# Patient Record
Sex: Female | Born: 1935 | Race: White | Hispanic: No | Smoking: Never smoker
Health system: Southern US, Community
[De-identification: ages and names within clinical notes are randomized; demographics above are authoritative.]

## PROBLEM LIST (undated history)

## (undated) DIAGNOSIS — K5792 Diverticulitis of intestine, part unspecified, without perforation or abscess without bleeding: Secondary | ICD-10-CM

## (undated) DIAGNOSIS — M1990 Unspecified osteoarthritis, unspecified site: Secondary | ICD-10-CM

## (undated) DIAGNOSIS — R0600 Dyspnea, unspecified: Secondary | ICD-10-CM

## (undated) DIAGNOSIS — I639 Cerebral infarction, unspecified: Secondary | ICD-10-CM

## (undated) DIAGNOSIS — I4891 Unspecified atrial fibrillation: Secondary | ICD-10-CM

## (undated) DIAGNOSIS — I10 Essential (primary) hypertension: Secondary | ICD-10-CM

## (undated) DIAGNOSIS — K219 Gastro-esophageal reflux disease without esophagitis: Secondary | ICD-10-CM

## (undated) DIAGNOSIS — E785 Hyperlipidemia, unspecified: Secondary | ICD-10-CM

## (undated) DIAGNOSIS — G459 Transient cerebral ischemic attack, unspecified: Secondary | ICD-10-CM

## (undated) HISTORY — DX: Essential (primary) hypertension: I10

## (undated) HISTORY — PX: WRIST SURGERY: SHX841

## (undated) HISTORY — PX: KNEE SURGERY: SHX244

## (undated) HISTORY — DX: Diverticulitis of intestine, part unspecified, without perforation or abscess without bleeding: K57.92

## (undated) HISTORY — DX: Hyperlipidemia, unspecified: E78.5

## (undated) HISTORY — DX: Unspecified atrial fibrillation: I48.91

## (undated) HISTORY — DX: Transient cerebral ischemic attack, unspecified: G45.9

---

## 2020-04-16 HISTORY — PX: HIP FRACTURE SURGERY: SHX118

## 2020-07-08 ENCOUNTER — Ambulatory Visit (INDEPENDENT_AMBULATORY_CARE_PROVIDER_SITE_OTHER): Payer: Medicare Other | Admitting: Diagnostic Neuroimaging

## 2020-07-08 ENCOUNTER — Encounter: Payer: Self-pay | Admitting: Diagnostic Neuroimaging

## 2020-07-08 VITALS — BP 164/75 | HR 65 | Wt 175.0 lb

## 2020-07-08 DIAGNOSIS — G459 Transient cerebral ischemic attack, unspecified: Secondary | ICD-10-CM | POA: Diagnosis not present

## 2020-07-08 NOTE — Progress Notes (Signed)
[facility]  PATIENT: [name] DOB: [birth year]  REFERRING CLINICIAN: No ref. provider found (self; via son-in-law Dr. Beverely Pace) HISTORY FROM: patient  REASON FOR VISIT: new consult    HISTORICAL  CHIEF COMPLAINT:  Chief Complaint  Patient presents with  . New Patient (Initial Visit)    Rm 6 here as a new for to f/u on TIA. Pt is here with her  son in Youth worker.  . Transient Ischemic Attack    HISTORY OF PRESENT ILLNESS:   86 year old female with hypertension, hyperlipidemia, transient atrial fibrillation in May 2021, here for evaluation of transient left-sided numbness.  May 2021 patient was living at home alone in [place], turned around lost balance and fell down.  She ended up with a left hip fracture requiring surgery.  Postoperatively patient had transient atrial fibrillation, treated with amiodarone and then resolved. Hospital course was complicated by pneumonia and other factors.  She had a prolonged course and rehabilitation.  In July 2021 patient was finally able to return home.  Patient came to [place] to be closer to her daughter and son-in-law for the last few weeks.    07/07/2020 at 2 PM patient had transient left face, left arm and slight left leg numbness.  Symptoms lasted for less than 1 hour and then resolved.  Patient son-in-law who is an ER physician (previously worked at [employer] long) came to patient's aid and then called our office to expedite an evaluation.  Today patient feels well.  No recurrent symptoms.   REVIEW OF SYSTEMS: Full 14 system review of systems performed and negative with exception of: As per HPI.  ALLERGIES: No Known Allergies  HOME MEDICATIONS: Outpatient Medications Prior to Visit  Medication Sig Dispense Refill  . amLODipine (NORVASC) 10 MG tablet Take 10 mg by mouth daily.    . Ascorbic Acid (VITAMIN C) 1000 MG tablet Take 1,000 mg by mouth daily.    Marland Kitchen aspirin 81 MG chewable tablet Chew 81 mg by  mouth daily.    . benazepril (LOTENSIN) 20 MG tablet Take 20 mg by mouth daily.    . cetirizine (ZYRTEC) 10 MG tablet Take by mouth daily.    . ferrous sulfate (FERROUSUL) 325 (65 FE) MG tablet Take by mouth daily with breakfast.    . gabapentin (NEURONTIN) 300 MG capsule Take 300 mg by mouth 3 (three) times daily.    Marland Kitchen HYDROcodone-acetaminophen (NORCO/VICODIN) 5-325 MG tablet Take 1 tablet by mouth every 6 (six) hours as needed for moderate pain.    Marland Kitchen labetalol (NORMODYNE) 200 MG tablet Take 200 mg by mouth 2 (two) times daily.    . methocarbamol (ROBAXIN) 500 MG tablet Take 500 mg by mouth 4 (four) times daily.    . Multiple Vitamin (MULTIVITAMIN ADULT PO) Take by mouth.    . Multiple Vitamins-Minerals (PRESERVISION AREDS PO) Take by mouth.    . Polyethylene Glycol 3350 (MIRALAX PO) Take by mouth.    . Potassium Chloride (KCL-20 PO) Take by mouth.    . pravastatin (PRAVACHOL) 20 MG tablet Take by mouth daily.    Marland Kitchen spironolactone (ALDACTONE) 25 MG tablet Take by mouth daily.    . temazepam (RESTORIL) 30 MG capsule Take by mouth at bedtime as needed for sleep.    . vitamin B-12 (CYANOCOBALAMIN) 1000 MCG tablet Take 1,000 mcg by mouth daily.    . Vitamin D, Cholecalciferol, 50 MCG (2000 UT) CAPS Take by mouth.    . Vitamin E 400 units TABS  Take by mouth.     No facility-administered medications prior to visit.    PAST MEDICAL HISTORY: Past Medical History:  Diagnosis Date  . Atrial fibrillation, transient (HCC)    post op hip surgery; May 2021  . Hyperlipidemia   . Hypertension     PAST SURGICAL HISTORY: Past Surgical History:  Procedure Laterality Date  . HIP FRACTURE SURGERY Left 04/16/2020    FAMILY HISTORY: No family history on file.  SOCIAL HISTORY: Social History   Socioeconomic History  . Marital status: Widowed    Spouse name: Not on file  . Number of children: Not on file  . Years of education: Not on file  . Highest education level: Not on file  Occupational  History  . Not on file  Tobacco Use  . Smoking status: Not on file  Substance and Sexual Activity  . Alcohol use: Not on file  . Drug use: Not on file  . Sexual activity: Not on file  Other Topics Concern  . Not on file  Social History Narrative  . Not on file   Social Determinants of Health   Financial Resource Strain:   . Difficulty of Paying Living Expenses: Not on file  Food Insecurity:   . Worried About Programme researcher, broadcasting/film/video in the Last Year: Not on file  . Ran Out of Food in the Last Year: Not on file  Transportation Needs:   . Lack of Transportation (Medical): Not on file  . Lack of Transportation (Non-Medical): Not on file  Physical Activity:   . Days of Exercise per Week: Not on file  . Minutes of Exercise per Session: Not on file  Stress:   . Feeling of Stress : Not on file  Social Connections:   . Frequency of Communication with Friends and Family: Not on file  . Frequency of Social Gatherings with Friends and Family: Not on file  . Attends Religious Services: Not on file  . Active Member of Clubs or Organizations: Not on file  . Attends Banker Meetings: Not on file  . Marital Status: Not on file  Intimate Partner Violence:   . Fear of Current or Ex-Partner: Not on file  . Emotionally Abused: Not on file  . Physically Abused: Not on file  . Sexually Abused: Not on file     PHYSICAL EXAM  GENERAL EXAM/CONSTITUTIONAL: Vitals:  Vitals:   07/08/20 1004  BP: (!) 164/75  Pulse: 65  Weight: 175 lb (79.4 kg)     There is no height or weight on file to calculate BMI. Wt Readings from Last 3 Encounters:  07/08/20 175 lb (79.4 kg)     Patient is in no distress; well developed, nourished and groomed; neck is supple  CARDIOVASCULAR:  Examination of carotid arteries is normal; no carotid bruits  Regular rate and rhythm, no murmurs  Examination of peripheral vascular system by observation and palpation is normal  EYES:  Ophthalmoscopic  exam of optic discs and posterior segments is normal; no papilledema or hemorrhages  No exam data present  MUSCULOSKELETAL:  Gait, strength, tone, movements noted in Neurologic exam below  NEUROLOGIC: MENTAL STATUS:  No flowsheet data found.  awake, alert, oriented to person, place and time  recent and remote memory intact  normal attention and concentration  language fluent, comprehension intact, naming intact  fund of knowledge appropriate  CRANIAL NERVE:   2nd - no papilledema on fundoscopic exam  2nd, 3rd, 4th, 6th - pupils equal  and reactive to light, visual fields full to confrontation, extraocular muscles intact, no nystagmus  5th - facial sensation symmetric  7th - facial strength symmetric  8th - hearing intact  9th - palate elevates symmetrically, uvula midline  11th - shoulder shrug symmetric  12th - tongue protrusion midline  MOTOR:   normal bulk and tone, full strength in the BUE, BLE; EXCEPT DECR IN LEFT HIP FLEXION (LIMITED BY PAIN)  SENSORY:   normal and symmetric to light touch, temperature, vibration; EXCEPT DECR IN FEET / ANKLES  COORDINATION:   finger-nose-finger, fine finger movements normal  REFLEXES:   deep tendon reflexes --> BUE 3; POSITIVE HOFFMANS; KNEES 2; ANKLES 1  GAIT/STATION:   narrow based gait; STOOPED POSTURE; USING ROLLATOR WALKER     DIAGNOSTIC DATA (LABS, IMAGING, TESTING) - I reviewed patient records, labs, notes, testing and imaging myself where available.  No results found for: WBC, HGB, HCT, MCV, PLT No results found for: NA, K, CL, CO2, GLUCOSE, BUN, CREATININE, CALCIUM, PROT, ALBUMIN, AST, ALT, ALKPHOS, BILITOT, GFRNONAA, GFRAA No results found for: CHOL, HDL, LDLCALC, LDLDIRECT, TRIG, CHOLHDL No results found for: GYKZ9D No results found for: VITAMINB12 No results found for: TSH     ASSESSMENT AND PLAN  86 y.o. year old female here with transient left face, left arm and left leg numbness on  07/07/2020, lasting 1 hour.  May represent mild TIA.  We will proceed with further work-up.  Localization: right brain subcortical  Dx:  1. TIA (transient ischemic attack)     PLAN:  LEFT FACE / ARM / LEG numbness - MRI brain, MRA head - carotid u/s - TTE - labs  POST-OP transient atrial fibrillation (May 2021) - appears to be in sinus rhythm now; will refer to cardiology for workup and eval; patient's son-in-law (Dr. Carren Rang) requesting Dr. Johney Frame for consult; may need to consider anticoagulation pending workup  GAIT DIFFICULTY (hyperreflexia in upper ext; decr sensation in lower ext; ? cervical myelopathy, lumbar spinal stenosis, neuropathy) - consider MRI cervical spine, lumbar spine, neuropathy evaluation in future  Orders Placed This Encounter  Procedures  . MR BRAIN WO CONTRAST  . MR ANGIO HEAD WO CONTRAST  . CBC with Differential/Platelet  . Hemoglobin A1c  . Lipid Panel  . Comprehensive metabolic panel  . Ambulatory referral to Cardiology  . ECHOCARDIOGRAM COMPLETE BUBBLE STUDY  . VAS US CAROTID   Return for pending if symptoms worsen or fail to improve.  I reviewed images, labs, notes, records myself. I summarized findings and reviewed with patient, for this high risk condition (TIA) requiring high complexity decision making.    Suanne Marker, MD 07/08/2020, 10:50 AM Certified in Neurology, Neurophysiology and Neuroimaging  [facility] [address], [address] [place], [place] [address] [phone]

## 2020-07-09 LAB — CBC WITH DIFFERENTIAL/PLATELET
Basophils Absolute: 0.1 10*3/uL (ref 0.0–0.2)
Basos: 1 %
EOS (ABSOLUTE): 0.2 10*3/uL (ref 0.0–0.4)
Eos: 2 %
Hematocrit: 39 % (ref 34.0–46.6)
Hemoglobin: 12.2 g/dL (ref 11.1–15.9)
Immature Grans (Abs): 0 10*3/uL (ref 0.0–0.1)
Immature Granulocytes: 0 %
Lymphocytes Absolute: 1.9 10*3/uL (ref 0.7–3.1)
Lymphs: 20 %
MCH: 25.2 pg — ABNORMAL LOW (ref 26.6–33.0)
MCHC: 31.3 g/dL — ABNORMAL LOW (ref 31.5–35.7)
MCV: 80 fL (ref 79–97)
Monocytes Absolute: 0.7 10*3/uL (ref 0.1–0.9)
Monocytes: 7 %
Neutrophils Absolute: 6.6 10*3/uL (ref 1.4–7.0)
Neutrophils: 70 %
Platelets: 299 10*3/uL (ref 150–450)
RBC: 4.85 x10E6/uL (ref 3.77–5.28)
RDW: 14.9 % (ref 11.7–15.4)
WBC: 9.5 10*3/uL (ref 3.4–10.8)

## 2020-07-09 LAB — COMPREHENSIVE METABOLIC PANEL
ALT: 8 IU/L (ref 0–32)
AST: 14 IU/L (ref 0–40)
Albumin/Globulin Ratio: 2.1 (ref 1.2–2.2)
Albumin: 4.5 g/dL (ref 3.6–4.6)
Alkaline Phosphatase: 123 IU/L — ABNORMAL HIGH (ref 48–121)
BUN/Creatinine Ratio: 21 (ref 12–28)
BUN: 17 mg/dL (ref 8–27)
Bilirubin Total: 0.3 mg/dL (ref 0.0–1.2)
CO2: 25 mmol/L (ref 20–29)
Calcium: 10.4 mg/dL — ABNORMAL HIGH (ref 8.7–10.3)
Chloride: 98 mmol/L (ref 96–106)
Creatinine, Ser: 0.82 mg/dL (ref 0.57–1.00)
GFR calc Af Amer: 75 mL/min/{1.73_m2} (ref >59)
GFR calc non Af Amer: 65 mL/min/{1.73_m2} (ref >59)
Globulin, Total: 2.1 g/dL (ref 1.5–4.5)
Glucose: 98 mg/dL (ref 65–99)
Potassium: 4.9 mmol/L (ref 3.5–5.2)
Sodium: 137 mmol/L (ref 134–144)
Total Protein: 6.6 g/dL (ref 6.0–8.5)

## 2020-07-09 LAB — LIPID PANEL
Chol/HDL Ratio: 5.6 ratio — ABNORMAL HIGH (ref 0.0–4.4)
Cholesterol, Total: 211 mg/dL — ABNORMAL HIGH (ref 100–199)
HDL: 38 mg/dL — ABNORMAL LOW (ref >39)
LDL Chol Calc (NIH): 137 mg/dL — ABNORMAL HIGH (ref 0–99)
Triglycerides: 198 mg/dL — ABNORMAL HIGH (ref 0–149)
VLDL Cholesterol Cal: 36 mg/dL (ref 5–40)

## 2020-07-09 LAB — HEMOGLOBIN A1C
Est. average glucose Bld gHb Est-mCnc: 114 mg/dL
Hgb A1c MFr Bld: 5.6 % (ref 4.8–5.6)

## 2020-07-11 ENCOUNTER — Telehealth: Payer: Self-pay | Admitting: Diagnostic Neuroimaging

## 2020-07-11 NOTE — Telephone Encounter (Signed)
Medicare/bcbs supp no auth. And the Francine Graven is a RX card . I emailed Rinaldo Cloud the manager at [facility] to see if they can get him in as soon as possible.

## 2020-07-11 NOTE — Telephone Encounter (Signed)
Patient is scheduled at [facility] for 07/12/20.

## 2020-07-12 ENCOUNTER — Ambulatory Visit
Admission: RE | Admit: 2020-07-12 | Discharge: 2020-07-12 | Disposition: A | Discharge status: Home / Self Care | Payer: Medicare Other | Source: Ambulatory Visit | Attending: Diagnostic Neuroimaging | Admitting: Diagnostic Neuroimaging

## 2020-07-12 ENCOUNTER — Other Ambulatory Visit: Payer: Self-pay

## 2020-07-12 DIAGNOSIS — G459 Transient cerebral ischemic attack, unspecified: Secondary | ICD-10-CM

## 2020-07-13 ENCOUNTER — Telehealth: Payer: Self-pay | Admitting: *Deleted

## 2020-07-13 NOTE — Telephone Encounter (Addendum)
Called daughter, Dennie Bible and informed her the patient's labs are ok, except elevated lipids; Dr Marjory Lies advises she follow up with PCP to adjust statin medication. Pat asked that I call her husband Gloris Manchester who was in appointment with patient. I called Vince, LVM requesting call back.  Received call back from [facility] who asked for specific lipid panel #, gave him information. Advised the results will be in her my chart.  He  verbalized understanding, appreciation.

## 2020-07-15 ENCOUNTER — Other Ambulatory Visit: Payer: Self-pay

## 2020-07-15 ENCOUNTER — Ambulatory Visit (HOSPITAL_COMMUNITY)
Admission: RE | Admit: 2020-07-15 | Discharge: 2020-07-15 | Disposition: A | Discharge status: Home / Self Care | Payer: Medicare Other | Source: Ambulatory Visit | Attending: Diagnostic Neuroimaging | Admitting: Diagnostic Neuroimaging

## 2020-07-15 DIAGNOSIS — G459 Transient cerebral ischemic attack, unspecified: Secondary | ICD-10-CM | POA: Diagnosis not present

## 2020-07-18 ENCOUNTER — Telehealth: Payer: Self-pay | Admitting: *Deleted

## 2020-07-18 ENCOUNTER — Encounter: Payer: Self-pay | Admitting: *Deleted

## 2020-07-18 NOTE — Telephone Encounter (Signed)
Spoke with Vince, son-in-law on DPR and advised her MRI brain, MRA head results are unremarkable imaging results. She has up coming appointment with cardiology and ECHO scheduled. I advised Dr Marjory Lies will see echo results and then make any other recommendations for her care. He verbalized understanding, appreciation.

## 2020-07-21 ENCOUNTER — Ambulatory Visit (INDEPENDENT_AMBULATORY_CARE_PROVIDER_SITE_OTHER): Payer: Medicare Other | Admitting: Internal Medicine

## 2020-07-21 ENCOUNTER — Other Ambulatory Visit: Payer: Self-pay

## 2020-07-21 ENCOUNTER — Encounter: Payer: Self-pay | Admitting: Internal Medicine

## 2020-07-21 ENCOUNTER — Telehealth: Payer: Self-pay | Admitting: Radiology

## 2020-07-21 VITALS — BP 130/72 | HR 65 | Ht 68.0 in | Wt 177.4 lb

## 2020-07-21 DIAGNOSIS — I480 Paroxysmal atrial fibrillation: Secondary | ICD-10-CM | POA: Insufficient documentation

## 2020-07-21 DIAGNOSIS — I10 Essential (primary) hypertension: Secondary | ICD-10-CM

## 2020-07-21 DIAGNOSIS — G459 Transient cerebral ischemic attack, unspecified: Secondary | ICD-10-CM | POA: Diagnosis not present

## 2020-07-21 MED ORDER — APIXABAN 5 MG PO TABS: 5.0000 mg | ORAL_TABLET | Freq: Two times a day (BID) | ORAL | 11 refills | Status: DC

## 2020-07-21 NOTE — Patient Instructions (Addendum)
Medication Instructions:  Your physician has recommended you make the following change in your medication:  1.  STOP taking aspirin  2.  START taking ELIQUIS 5 mg- Take one tablet by mouth TWICE a day.  Labwork: None ordered.  Testing/Procedures: Your physician has recommended that you wear a holter monitor. Holter monitors are medical devices that record the heart's electrical activity. Doctors most often use these monitors to diagnose arrhythmias. Arrhythmias are problems with the speed or rhythm of the heartbeat.   You are going to wear a 30 day heart monitor.  Do NOT put this monitor on until AFTER your ECHO on August 02, 2020.   Follow-Up: Your physician wants you to follow-up in: 3 months with Dr. Johney Frame.       Any Other Special Instructions Will Be Listed Below (If Applicable).  If you need a refill on your cardiac medications before your next appointment, please call your pharmacy.   [employer] Cardiac Event Monitor Instructions Your physician has requested you wear your cardiac event monitor for 30 days.  Do NOT wear until AFTER your ECHO  [employer] may call or text to confirm a shipping address. The monitor will be sent to a land address via UPS. [employer] will not ship a monitor to a PO BOX. It typically takes 3-5 days to receive your monitor after it has been enrolled. [employer] will assist with USPS tracking if your package is delayed. The telephone number for [employer] is [phone]. Once you have received your monitor, please review the enclosed instructions. Instruction tutorials can also be viewed under help and settings on the enclosed cell phone. Your monitor has already been registered assigning a specific monitor serial # to you.  Applying the monitor Remove cell phone from case and turn it on. The cell phone works as IT consultant and needs to be within [employer] of you at all times. The cell phone will need to be charged on a daily basis. We  recommend you plug the cell phone into the enclosed charger at your bedside table every night.  Monitor batteries: You will receive two monitor batteries labelled #1 and #2. These are your recorders. Plug battery #2 onto the second connection on the enclosed charger. Keep one battery on the charger at all times. This will keep the monitor battery deactivated. It will also keep it fully charged for when you need to switch your monitor batteries. A small light will be blinking on the battery emblem when it is charging. The light on the battery emblem will remain on when the battery is fully charged.  Open package of a Monitor strip. Insert battery #1 into black hood on strip and gently squeeze monitor battery onto connection as indicated in instruction booklet. Set aside while preparing skin.  Choose location for your strip, vertical or horizontal, as indicated in the instruction booklet. Shave to remove all hair from location. There cannot be any lotions, oils, powders, or colognes on skin where monitor is to be applied. Wipe skin clean with enclosed Saline wipe. Dry skin completely.  Peel paper labeled #1 off the back of the Monitor strip exposing the adhesive. Place the monitor on the chest in the vertical or horizontal position shown in the instruction booklet. One arrow on the monitor strip must be pointing upward. Carefully remove paper labeled #2, attaching remainder of strip to your skin. Try not to create any folds or wrinkles in the strip as you apply it.  Firmly press and release the circle  in the center of the monitor battery. You will hear a small beep. This is turning the monitor battery on. The heart emblem on the monitor battery will light up every 5 seconds if the monitor battery in turned on and connected to the patient securely. Do not push and hold the circle down as this turns the monitor battery off. The cell phone will locate the monitor battery. A screen will appear on  the cell phone checking the connection of your monitor strip. This may read poor connection initially but change to good connection within the next minute. Once your monitor accepts the connection you will hear a series of 3 beeps followed by a climbing crescendo of beeps. A screen will appear on the cell phone showing the two monitor strip placement options. Touch the picture that demonstrates where you applied the monitor strip.  Your monitor strip and battery are waterproof. You are able to shower, bathe, or swim with the monitor on. They just ask you do not submerge deeper than 3 feet underwater. We recommend removing the monitor if you are swimming in a lake, river, or ocean.  Your monitor battery will need to be switched to a fully charged monitor battery approximately once a week. The cell phone will alert you of an action which needs to be made.  On the cell phone, tap for details to reveal connection status, monitor battery status, and cell phone battery status. The green dots indicates your monitor is in good status. A red dot indicates there is something that needs your attention.  To record a symptom, click the circle on the monitor battery. In 30-60 seconds a list of symptoms will appear on the cell phone. Select your symptom and tap save. Your monitor will record a sustained or significant arrhythmia regardless of you clicking the button. Some patients do not feel the heart rhythm irregularities. [employer] will notify us of any serious or critical events.  Refer to instruction booklet for instructions on switching batteries, changing strips, the Do not disturb or Pause features, or any additional questions.  Call [employer] at [phone], to confirm your monitor is transmitting and record your baseline. They will answer any questions you may have regarding the monitor instructions at that time.  Returning the monitor to [employer] Place all equipment back into blue  box. Peel off strip of paper to expose adhesive and close box securely. There is a prepaid UPS shipping label on this box. Drop in a UPS drop box, or at a UPS facility like [employer]. You may also contact [employer] to arrange UPS to pick up monitor package at your home.

## 2020-07-21 NOTE — Telephone Encounter (Signed)
Enrolled patient for a 30 day [employer] Event Monitor to be mailed to patients home. Per dr patient is to apply after 9/14

## 2020-07-21 NOTE — Progress Notes (Signed)
Electrophysiology Office Note   Date:  07/21/2020   ID:  [name], DOB [birth year], MRN [id number]  PCP:  Patient, No Pcp Per  Cardiologist:  none Primary Electrophysiologist: Hillis Range, MD    CC: afib   History of Present Illness: [name] is a 86 y.o. female who presents today for electrophysiology evaluation.  She presents on referral by Dr Marjory Lies for EP consultation regarding afib. The patient had a fall in May 2021 and was evaluated at a hospital in [facility].  On plans for surgery, she was noted to have afib.  Per her family, the patient had a cardiology evaluation at that tiime and was (after 2 days) felt to be safe for hip surgery. She underwent hip surgery without issues. She developed transient left face, arm and leg numbness 07/07/20 for which she was evaluated by Dr Marjory Lies.  She is felt to have had a TIA.   Given recent afib, she is referred for EP evaluation. Of note, she has been treated with ASA and has not been on oral anticoagulation previously.  Today, she denies symptoms of palpitations, chest pain, shortness of breath, orthopnea, PND, lower extremity edema, claudication, dizziness, presyncope, syncope, bleeding, or neurologic sequela. The patient is tolerating medications without difficulties and is otherwise without complaint today.    Past Medical History:  Diagnosis Date  . Atrial fibrillation, transient (HCC)    post op hip surgery; May 2021  . Diverticulitis   . Hyperlipidemia   . Hypertension   . TIA (transient ischemic attack)    Past Surgical History:  Procedure Laterality Date  . HIP FRACTURE SURGERY Left 04/16/2020     Current Outpatient Medications  Medication Sig Dispense Refill  . amLODipine (NORVASC) 10 MG tablet Take 10 mg by mouth daily.    . Ascorbic Acid (VITAMIN C) 1000 MG tablet Take 1,000 mg by mouth daily.    . benazepril (LOTENSIN) 20 MG tablet Take 20 mg by mouth daily.    . cetirizine (ZYRTEC) 10  MG tablet Take by mouth daily.    . ferrous sulfate (FERROUSUL) 325 (65 FE) MG tablet Take by mouth daily with breakfast.    . gabapentin (NEURONTIN) 300 MG capsule Take 300 mg by mouth 3 (three) times daily.    Marland Kitchen HYDROcodone-acetaminophen (NORCO/VICODIN) 5-325 MG tablet Take 1 tablet by mouth every 6 (six) hours as needed for moderate pain.    Marland Kitchen labetalol (NORMODYNE) 200 MG tablet Take 200 mg by mouth 2 (two) times daily.    . methocarbamol (ROBAXIN) 500 MG tablet Take 500 mg by mouth 4 (four) times daily.    . Multiple Vitamin (MULTIVITAMIN ADULT PO) Take by mouth.    . Multiple Vitamins-Minerals (PRESERVISION AREDS PO) Take by mouth.    . Polyethylene Glycol 3350 (MIRALAX PO) Take by mouth.    . Potassium Chloride (KCL-20 PO) Take by mouth.    . pravastatin (PRAVACHOL) 20 MG tablet Take by mouth daily.    Marland Kitchen spironolactone (ALDACTONE) 25 MG tablet Take by mouth daily.    . temazepam (RESTORIL) 30 MG capsule Take by mouth at bedtime as needed for sleep.    . vitamin B-12 (CYANOCOBALAMIN) 1000 MCG tablet Take 1,000 mcg by mouth daily.    . Vitamin D, Cholecalciferol, 50 MCG (2000 UT) CAPS Take by mouth.    . Vitamin E 400 units TABS Take by mouth.    Marland Kitchen apixaban (ELIQUIS) 5 MG TABS tablet Take 1 tablet (5 mg total) by mouth  2 (two) times daily. 60 tablet 11   No current facility-administered medications for this visit.    Allergies:   Patient has no known allergies.   Social History:  The patient  reports that she has never smoked. She has never used smokeless tobacco. She reports that she does not drink alcohol.   Family History:  The patient's  family history includes Diabetes in her mother.    ROS:  Please see the history of present illness.   All other systems are personally reviewed and negative.    PHYSICAL EXAM: VS:  BP 130/72   Pulse 65   Ht 5\' 8"  (1.727 m)   Wt 177 lb 6.4 oz (80.5 kg)   SpO2 97%   BMI 26.97 kg/m  , BMI Body mass index is 26.97 kg/m. GEN: Well  nourished, well developed, in no acute distress HEENT: normal Neck: no JVD, carotid bruits, or masses Cardiac: RRR; no murmurs, rubs, or gallops,no edema  Respiratory:  clear to auscultation bilaterally, normal work of breathing GI: soft, nontender, nondistended, + BS MS: no deformity or atrophy Skin: warm and dry  Neuro:  Strength and sensation are intact Psych: euthymic mood, full affect  EKG:  EKG is ordered today. The ekg ordered today is personally reviewed and shows sinus rhythm 65 bpm PR 170 msec, otherwise normal ekg   Recent Labs: 07/08/2020: ALT 8; BUN 17; Creatinine, Ser 0.82; Hemoglobin 12.2; Platelets 299; Potassium 4.9; Sodium 137  personally reviewed   Lipid Panel     Component Value Date/Time   CHOL 211 (H) 07/08/2020 1130   TRIG 198 (H) 07/08/2020 1130   HDL 38 (L) 07/08/2020 1130   CHOLHDL 5.6 (H) 07/08/2020 1130   LDLCALC 137 (H) 07/08/2020 1130   personally reviewed   Wt Readings from Last 3 Encounters:  07/21/20 177 lb 6.4 oz (80.5 kg)  07/08/20 175 lb (79.4 kg)      Other studies personally reviewed: Additional studies/ records that were reviewed today include: Dr 07/10/20 notes  Review of the above records today demonstrates: as above   ASSESSMENT AND PLAN:  1.  afib The patient had afib recently documented in the setting of hip fracture, but prior to hip surgery.  I will attempt to obtain records from this hospitalization. I will also place a 30 day monitor to evaluate for recurrent afib. As she appears to be asymptomatic, I would not adjuste medicine at this time for rate control or consider AADs. Given chads2vasc score of at least 6, I will stop ASA and start eliquis. Obtain an echo  2. TIA Stop ASA and start eliquis as above  3. HTN Stable No change required today echo  Risks, benefits and potential toxicities for medications prescribed and/or refilled reviewed with patient today.    Follow-up:  Return to see me in 2-3  months  Current medicines are reviewed at length with the patient today.   The patient does not have concerns regarding her medicines.  The following changes were made today:  none  Labs/ tests ordered today include:  Orders Placed This Encounter  Procedures  . Cardiac event monitor  . EKG 12-Lead     Signed, Richrd Humbles, MD  07/21/2020 8:46 PM     [facility] [address] [place] [phone] (office) [phone] (fax)

## 2020-07-28 ENCOUNTER — Other Ambulatory Visit: Payer: Self-pay | Admitting: Orthopedic Surgery

## 2020-07-28 DIAGNOSIS — S72042A Displaced fracture of base of neck of left femur, initial encounter for closed fracture: Secondary | ICD-10-CM

## 2020-08-01 ENCOUNTER — Ambulatory Visit
Admission: RE | Admit: 2020-08-01 | Discharge: 2020-08-01 | Disposition: A | Discharge status: Home / Self Care | Payer: Medicare Other | Source: Ambulatory Visit | Attending: Orthopedic Surgery | Admitting: Orthopedic Surgery

## 2020-08-01 ENCOUNTER — Other Ambulatory Visit: Payer: Self-pay

## 2020-08-01 DIAGNOSIS — S72042A Displaced fracture of base of neck of left femur, initial encounter for closed fracture: Secondary | ICD-10-CM

## 2020-08-02 ENCOUNTER — Telehealth: Payer: Self-pay | Admitting: *Deleted

## 2020-08-02 ENCOUNTER — Ambulatory Visit (HOSPITAL_COMMUNITY): Payer: Medicare Other | Attending: Cardiovascular Disease

## 2020-08-02 DIAGNOSIS — G459 Transient cerebral ischemic attack, unspecified: Secondary | ICD-10-CM | POA: Diagnosis not present

## 2020-08-02 LAB — ECHOCARDIOGRAM COMPLETE BUBBLE STUDY
Area-P 1/2: 2.8 cm2
Calc EF: 49.2 %
S' Lateral: 5.3 cm
Single Plane A2C EF: 50.8 %
Single Plane A4C EF: 49.2 %

## 2020-08-02 NOTE — Telephone Encounter (Signed)
Spoke with Vince, son-in-law, on DPR and informed him her echocardiogram result is unremarkable, no major findings. He verbalized understanding, appreciation.

## 2020-08-10 ENCOUNTER — Institutional Professional Consult (permissible substitution): Payer: Medicare Other | Admitting: Internal Medicine

## 2020-08-10 ENCOUNTER — Other Ambulatory Visit: Payer: Medicare Other

## 2020-08-10 ENCOUNTER — Encounter (INDEPENDENT_AMBULATORY_CARE_PROVIDER_SITE_OTHER): Payer: Medicare Other

## 2020-08-10 DIAGNOSIS — I480 Paroxysmal atrial fibrillation: Secondary | ICD-10-CM | POA: Diagnosis not present

## 2020-08-17 ENCOUNTER — Telehealth: Payer: Self-pay | Admitting: *Deleted

## 2020-08-17 NOTE — Telephone Encounter (Signed)
Thanks. Dr. Johney Frame - In addition to pharm input, see question below regarding event monitor. Jhonathan Desroches PA-C

## 2020-08-17 NOTE — Telephone Encounter (Signed)
   Primary Cardiologist: Hillis Range, MD  Chart reviewed as part of pre-operative protocol coverage. [name] was last seen on 07/21/20 by Dr. Johney Frame. Per notes, she had a history of fall in 03/2020 requiring hip surgery and tolerated well. She has history of TIA 06/2020. She recently saw Dr. Johney Frame for evaluation. He noted a prior history of atrial fibrillation in 03/2020, therefore recommended to switch ASA to Eliquis. 2D echo 08/02/20 showed EF 55-60%, grade 1 DD.  Event monitor is planned and is in progress. Will route to Dr. Johney Frame for input on whether monitor needs to be completed for to clearing for hip surgery. We will plan to call patient to assess for any recent symptoms but need to know if we need to wait until monitor is finished. Please route response to [facility] PREOP (the pre-op pool). Thank you.  Will also get the ball rolling on pharm input for Eliquis.  Laurann Montana, PA-C 08/17/2020, 4:40 PM

## 2020-08-17 NOTE — Telephone Encounter (Signed)
   [facility] Pre-operative Risk Assessment    HEARTCARE STAFF: - Please ensure there is not already an duplicate clearance open for this procedure. - Under Visit Info/Reason for Call, type in Other and utilize the format Clearance MM/DD/YY or Clearance TBD. Do not use dashes or single digits. - If request is for dental extraction, please clarify the # of teeth to be extracted.  Request for surgical clearance:  1. What type of surgery is being performed? CONVERSION TO LEFT TOTAL HIP ARTHROPLASTY-POSTERIOR APPROACH   2. When is this surgery scheduled? TBD   3. What type of clearance is required (medical clearance vs. Pharmacy clearance to hold med vs. Both)? BOTH  4. Are there any medications that need to be held prior to surgery and how long? ELIQUIS   5. Practice name and name of physician performing surgery? [facility]; DR. [place]   6. What is the office phone number? [phone]   7.   What is the office fax number? [place]  8.   Anesthesia type (None, local, MAC, general) ? SPINAL   Julaine Hua 08/17/2020, 2:03 PM  _________________________________________________________________   (provider comments below)

## 2020-08-17 NOTE — Telephone Encounter (Signed)
Patient with diagnosis of A Fib on Eliquis for anticoagulation.    Procedure: CONVERSION TO LEFT TOTAL HIP ARTHROPLASTY-POSTERIOR APPROACH  Date of procedure: TBD  CHADS2-VASc score of  6 (HTN, AGE x 2, stroke/tia x 2, female)  CrCl 63.74 Platelet count 299K  Due to patient's recent TIA, will route to MD for input

## 2020-08-23 NOTE — Telephone Encounter (Signed)
   Pt's daughter calling to follow up clearance, she said pt is in so much pain and need the surgery badly.

## 2020-08-23 NOTE — Telephone Encounter (Signed)
Dr. Johney Frame, Please seen [name]'s note below. Looks like patient is pending results from event monitor. Do you recommend holding off on surgery until event monitor is completed?

## 2020-08-24 NOTE — Telephone Encounter (Signed)
   Primary Cardiologist: Hillis Range, MD  Chart reviewed as part of pre-operative protocol coverage.   Per Dr. Johney Frame: I spoke with Dr Charlann Boxer.  Ok to proceed with surgery at this time.  Hold eliquis 24 hours prior to the procedure and resume as soon as able post operatively.  I will route this recommendation to the requesting party via Epic fax function and remove from pre-op pool. Please call with questions.  Roe Rutherford Edilia Ghuman, PA 08/24/2020, 11:06 AM

## 2020-08-24 NOTE — Telephone Encounter (Signed)
I spoke with Dr Charlann Boxer.  Ok to proceed with surgery at this time. Hold eliquis 24 hours prior to the procedure and resume as soon as able post operatively.

## 2020-08-25 ENCOUNTER — Telehealth: Payer: Self-pay

## 2020-08-25 NOTE — Telephone Encounter (Signed)
Daughter of the patient called to check on the status of the surgical clearance. Patient is in constant pain and is anxious to get procedure scheduled

## 2020-08-25 NOTE — Telephone Encounter (Signed)
   [facility] Pre-operative Risk Assessment    HEARTCARE STAFF: - Please ensure there is not already an duplicate clearance open for this procedure. - Under Visit Info/Reason for Call, type in Other and utilize the format Clearance MM/DD/YY or Clearance TBD. Do not use dashes or single digits. - If request is for dental extraction, please clarify the # of teeth to be extracted.  Request for surgical clearance:  1. What type of surgery is being performed? Conversion to left total hip arthroplasty-posterior approach    2. When is this surgery scheduled? TBD   3. What type of clearance is required (medical clearance vs. Pharmacy clearance to hold med vs. Both)? Medical   4. Are there any medications that need to be held prior to surgery and how long?   5. Practice name and name of physician performing surgery? [facility], Dr. Paralee Cancel    6. What is the office phone number? [phone]   7.   What is the office fax number? [phone]  8.   Anesthesia type (None, local, MAC, general) ? Spinal    [name] 08/25/2020, 2:10 PM  _________________________________________________________________   (provider comments below)

## 2020-08-25 NOTE — Telephone Encounter (Signed)
Cardiac clearance sent yesterday 08/24/2020 at 11:06 AM.  Please contact patient's daughter and let her know that her clearance was sent to Dr. Boneta Lucks office.  Thank you.

## 2020-08-25 NOTE — Telephone Encounter (Signed)
Pt daughter Dennie Bible notified. She will call requesting providers office and check status

## 2020-09-02 NOTE — Progress Notes (Signed)
DUE TO COVID-19 ONLY ONE VISITOR IS ALLOWED TO COME WITH YOU AND STAY IN THE WAITING ROOM ONLY DURING PRE OP AND PROCEDURE DAY OF SURGERY. THE 1 VISITOR  MAY VISIT WITH YOU AFTER SURGERY IN YOUR PRIVATE ROOM DURING VISITING HOURS ONLY!  YOU NEED TO HAVE A COVID 19 TEST ON_10/22/2021 ______ @_______ , THIS TEST MUST BE DONE BEFORE SURGERY,  COVID TESTING SITE [address] [place] , IT IS ON THE RIGHT GOING OUT [address] APPROXIMATELY  2 MINUTES PAST [employer] ON THE RIGHT. ONCE YOUR COVID TEST IS COMPLETED,  PLEASE BEGIN THE QUARANTINE INSTRUCTIONS AS OUTLINED IN YOUR HANDOUT.                [name]  09/02/2020   Your procedure is scheduled on: 09/13/2020    Report to [facility] Main  Entrance   Report to admitting at   0600 AM     Call this number if you have problems the morning of surgery [phone]    REMEMBER: NO  SOLID FOOD CANDY OR GUM AFTER MIDNIGHT. CLEAR LIQUIDS UNTIL  0540am       . NOTHING BY MOUTH EXCEPT CLEAR LIQUIDS UNTIL    . PLEASE FINISH ENSURE DRINK PER SURGEON ORDER  WHICH NEEDS TO BE COMPLETED AT  0540am  .      CLEAR LIQUID DIET   Foods Allowed                                                                    Coffee and tea, regular and decaf                            Fruit ices (not with fruit pulp)                                      Iced Popsicles                                    Carbonated beverages, regular and diet                                    Cranberry, grape and apple juices Sports drinks like Gatorade Lightly seasoned clear broth or consume(fat free) Sugar, honey syrup ___________________________________________________________________      BRUSH YOUR TEETH MORNING OF SURGERY AND RINSE YOUR MOUTH OUT, NO CHEWING GUM CANDY OR MINTS.     Take these medicines the morning of surgery with A SIP OF WATER:  Amlodipine, Zyrtec, Gabapentin, Labetalol, robaxin DO NOT TAKE ANY DIABETIC  MEDICATIONS DAY OF YOUR SURGERY                               You may not have any metal on your body including hair pins and              piercings  Do not wear jewelry, make-up,  lotions, powders or perfumes, deodorant             Do not wear nail polish on your fingernails.  Do not shave  48 hours prior to surgery.              Men may shave face and neck.   Do not bring valuables to the hospital. [place].  Contacts, dentures or bridgework may not be worn into surgery.  Leave suitcase in the car. After surgery it may be brought to your room.     Patients discharged the day of surgery will not be allowed to drive home. IF YOU ARE HAVING SURGERY AND GOING HOME THE SAME DAY, YOU MUST HAVE AN ADULT TO DRIVE YOU HOME AND BE WITH YOU FOR 24 HOURS. YOU MAY GO HOME BY TAXI OR UBER OR ORTHERWISE, BUT AN ADULT MUST ACCOMPANY YOU HOME AND STAY WITH YOU FOR 24 HOURS.  Name and phone number of your driver:  Special Instructions: N/A              Please read over the following fact sheets you were given: _____________________________________________________________________  [facility] - Preparing for Surgery Before surgery, you can play an important role.  Because skin is not sterile, your skin needs to be as free of germs as possible.  You can reduce the number of germs on your skin by washing with CHG (chlorahexidine gluconate) soap before surgery.  CHG is an antiseptic cleaner which kills germs and bonds with the skin to continue killing germs even after washing. Please DO NOT use if you have an allergy to CHG or antibacterial soaps.  If your skin becomes reddened/irritated stop using the CHG and inform your nurse when you arrive at Short Stay. Do not shave (including legs and underarms) for at least 48 hours prior to the first CHG shower.  You may shave your face/neck. Please follow these instructions carefully:  1.  Shower with CHG Soap the night  before surgery and the  morning of Surgery.  2.  If you choose to wash your hair, wash your hair first as usual with your  normal  shampoo.  3.  After you shampoo, rinse your hair and body thoroughly to remove the  shampoo.                           4.  Use CHG as you would any other liquid soap.  You can apply chg directly  to the skin and wash                       Gently with a scrungie or clean washcloth.  5.  Apply the CHG Soap to your body ONLY FROM THE NECK DOWN.   Do not use on face/ open                           Wound or open sores. Avoid contact with eyes, ears mouth and genitals (private parts).                       Wash face,  Genitals (private parts) with your normal soap.             6.  Wash thoroughly, paying special attention to  the area where your surgery  will be performed.  7.  Thoroughly rinse your body with warm water from the neck down.  8.  DO NOT shower/wash with your normal soap after using and rinsing off  the CHG Soap.                9.  Pat yourself dry with a clean towel.            10.  Wear clean pajamas.            11.  Place clean sheets on your bed the night of your first shower and do not  sleep with pets. Day of Surgery : Do not apply any lotions/deodorants the morning of surgery.  Please wear clean clothes to the hospital/surgery center.  FAILURE TO FOLLOW THESE INSTRUCTIONS MAY RESULT IN THE CANCELLATION OF YOUR SURGERY PATIENT SIGNATURE_________________________________  NURSE SIGNATURE__________________________________  ________________________________________________________________________

## 2020-09-05 ENCOUNTER — Encounter (HOSPITAL_COMMUNITY)
Admission: RE | Admit: 2020-09-05 | Discharge: 2020-09-05 | Disposition: A | Discharge status: Home / Self Care | Payer: Medicare Other | Source: Ambulatory Visit | Attending: Orthopedic Surgery | Admitting: Orthopedic Surgery

## 2020-09-05 ENCOUNTER — Encounter (HOSPITAL_COMMUNITY): Payer: Self-pay

## 2020-09-05 ENCOUNTER — Other Ambulatory Visit: Payer: Self-pay

## 2020-09-05 DIAGNOSIS — T8489XA Other specified complication of internal orthopedic prosthetic devices, implants and grafts, initial encounter: Secondary | ICD-10-CM | POA: Insufficient documentation

## 2020-09-05 DIAGNOSIS — E785 Hyperlipidemia, unspecified: Secondary | ICD-10-CM | POA: Diagnosis not present

## 2020-09-05 DIAGNOSIS — Z7901 Long term (current) use of anticoagulants: Secondary | ICD-10-CM | POA: Diagnosis not present

## 2020-09-05 DIAGNOSIS — I4891 Unspecified atrial fibrillation: Secondary | ICD-10-CM | POA: Diagnosis not present

## 2020-09-05 DIAGNOSIS — I10 Essential (primary) hypertension: Secondary | ICD-10-CM | POA: Diagnosis not present

## 2020-09-05 DIAGNOSIS — Z01818 Encounter for other preprocedural examination: Secondary | ICD-10-CM | POA: Diagnosis present

## 2020-09-05 DIAGNOSIS — Z79899 Other long term (current) drug therapy: Secondary | ICD-10-CM | POA: Diagnosis not present

## 2020-09-05 DIAGNOSIS — Z8673 Personal history of transient ischemic attack (TIA), and cerebral infarction without residual deficits: Secondary | ICD-10-CM | POA: Insufficient documentation

## 2020-09-05 DIAGNOSIS — Y838 Other surgical procedures as the cause of abnormal reaction of the patient, or of later complication, without mention of misadventure at the time of the procedure: Secondary | ICD-10-CM | POA: Diagnosis not present

## 2020-09-05 DIAGNOSIS — M1990 Unspecified osteoarthritis, unspecified site: Secondary | ICD-10-CM | POA: Insufficient documentation

## 2020-09-05 DIAGNOSIS — K219 Gastro-esophageal reflux disease without esophagitis: Secondary | ICD-10-CM | POA: Diagnosis not present

## 2020-09-05 HISTORY — DX: Gastro-esophageal reflux disease without esophagitis: K21.9

## 2020-09-05 HISTORY — DX: Unspecified osteoarthritis, unspecified site: M19.90

## 2020-09-05 HISTORY — DX: Dyspnea, unspecified: R06.00

## 2020-09-05 HISTORY — DX: Cerebral infarction, unspecified: I63.9

## 2020-09-05 LAB — CBC
HCT: 38.9 % (ref 36.0–46.0)
Hemoglobin: 12.4 g/dL (ref 12.0–15.0)
MCH: 26.8 pg (ref 26.0–34.0)
MCHC: 31.9 g/dL (ref 30.0–36.0)
MCV: 84 fL (ref 80.0–100.0)
Platelets: 262 10*3/uL (ref 150–400)
RBC: 4.63 MIL/uL (ref 3.87–5.11)
RDW: 15.2 % (ref 11.5–15.5)
WBC: 8.7 10*3/uL (ref 4.0–10.5)
nRBC: 0 % (ref 0.0–0.2)

## 2020-09-05 LAB — BASIC METABOLIC PANEL
Anion gap: 9 (ref 5–15)
BUN: 16 mg/dL (ref 8–23)
CO2: 27 mmol/L (ref 22–32)
Calcium: 9.8 mg/dL (ref 8.9–10.3)
Chloride: 100 mmol/L (ref 98–111)
Creatinine, Ser: 0.78 mg/dL (ref 0.44–1.00)
GFR, Estimated: >60 mL/min (ref >60)
Glucose, Bld: 98 mg/dL (ref 70–99)
Potassium: 4.1 mmol/L (ref 3.5–5.1)
Sodium: 136 mmol/L (ref 135–145)

## 2020-09-05 LAB — SURGICAL PCR SCREEN
MRSA, PCR: NEGATIVE
Staphylococcus aureus: NEGATIVE

## 2020-09-05 NOTE — Progress Notes (Signed)
Anesthesia Review:  PCP:  Cardiologist : DR Hillis Range  Clearance in 08/24/2020 Telephone oencunter  07/21/20- LOV with DR Allred  Chest x-ray : EKG :07/21/20  Echo :08/02/20 Stress test: Cardiac Cath :  Activity level: due to hip can not doa  Fight of stairs without difficulty.  Sleep Study/ CPAP : Fasting Blood Sugar :      / Checks Blood Sugar -- times a day:   Blood Thinner/ Instructions /Last Dose: ASA / Instructions/ Last Dose :  Eliquis- per daugher - they have received preop instructions

## 2020-09-06 NOTE — Progress Notes (Signed)
Anesthesia Chart Review   Case: [id number] Date/Time: 09/13/20 0825   Procedure: CONVERSION TO LEFT TOTAL HIP-POSTERIOR APPROACH (Left Hip) - 90 mins   Anesthesia type: Spinal   Pre-op diagnosis: Failed left hip surgery, malunion   Location: [facility] ROOM 10 / [facility]   Surgeons: Durene Romans, MD      DISCUSSION:87 y.o. never smoker with h/o HTN, a-fib (on Eliquis), GERD, TIA, failed left hip surgery with malunion scheduled for above procedure 09/13/2020 with Dr. Durene Romans.   Per cardiology note, "Per Dr. Johney Frame: I spoke with Dr Charlann Boxer. Ok to proceed with surgery at this time.  Hold eliquis 24 hours prior to the procedure and resume as soon as able post operatively."  Anticipate pt can proceed with planned procedure barring acute status change.   VS: BP (!) 144/49   Pulse 62   Temp 36.9 C (Oral)   Ht 5\' 7"  (1.702 m)   SpO2 97%   BMI 27.78 kg/m   PROVIDERS: Patient, No Pcp Per  , MD is Cardiologist  LABS: Labs reviewed: Acceptable for surgery. (all labs ordered are listed, but only abnormal results are displayed)  Labs Reviewed  SURGICAL PCR SCREEN  BASIC METABOLIC PANEL  CBC  TYPE AND SCREEN     IMAGES:   EKG: 07/21/2020 Rate 65 bpm  Sinus  CV: Echo 08/02/2020 IMPRESSIONS    1. Left ventricular ejection fraction, by estimation, is 55 to 60%. The  left ventricle has normal function. The left ventricle has no regional  wall motion abnormalities. Left ventricular diastolic parameters are  consistent with Grade I diastolic  dysfunction (impaired relaxation).  2. Right ventricular systolic function is normal. The right ventricular  size is normal. There is normal pulmonary artery systolic pressure. The  estimated right ventricular systolic pressure is 22.9 mmHg.  3. The mitral valve is degenerative. Trivial mitral valve regurgitation.  No evidence of mitral stenosis. Moderate mitral annular calcification.  4. The aortic valve is tricuspid. There is  mild calcification of the  aortic valve. Aortic valve regurgitation is not visualized. Mild aortic  valve sclerosis is present, with no evidence of aortic valve stenosis.  5. The inferior vena cava is normal in size with greater than 50%  respiratory variability, suggesting right atrial pressure of 3 mmHg.  6. Agitated saline contrast bubble study was negative, with no evidence  of any interatrial shunt. Past Medical History:  Diagnosis Date  . Arthritis   . Atrial fibrillation, transient (HCC)    post op hip surgery; May 2021  . Diverticulitis   . Dyspnea    with exertion   . GERD (gastroesophageal reflux disease)   . Hyperlipidemia   . Hypertension   . Stroke (HCC)    hx opf mini strokes   . TIA (transient ischemic attack)     Past Surgical History:  Procedure Laterality Date  . HIP FRACTURE SURGERY Left 04/16/2020  . KNEE SURGERY    . WRIST SURGERY      MEDICATIONS: . acetaminophen (TYLENOL) 500 MG tablet  . amLODipine (NORVASC) 10 MG tablet  . apixaban (ELIQUIS) 5 MG TABS tablet  . Ascorbic Acid (VITAMIN C) 1000 MG tablet  . benazepril (LOTENSIN) 20 MG tablet  . calcium carbonate (TUMS - DOSED IN MG ELEMENTAL CALCIUM) 500 MG chewable tablet  . cetirizine (ZYRTEC) 10 MG tablet  . Cholecalciferol (VITAMIN D3) 125 MCG (5000 UT) TABS  . diclofenac Sodium (VOLTAREN) 1 % GEL  . ferrous sulfate (FERROUSUL) 325 (65  FE) MG tablet  . gabapentin (NEURONTIN) 300 MG capsule  . labetalol (NORMODYNE) 200 MG tablet  . Multiple Vitamin (MULTIVITAMIN WITH MINERALS) TABS tablet  . Multiple Vitamins-Minerals (PRESERVISION AREDS 2 PO)  . oxyCODONE-acetaminophen (PERCOCET/ROXICET) 5-325 MG tablet  . Polyethylene Glycol 3350 (MIRALAX PO)  . Potassium Chloride (KCL-20 PO)  . pravastatin (PRAVACHOL) 20 MG tablet  . spironolactone (ALDACTONE) 25 MG tablet  . sucralfate (CARAFATE) 1 g tablet  . temazepam (RESTORIL) 30 MG capsule  . vitamin B-12 (CYANOCOBALAMIN) 1000 MCG tablet  .  Vitamin E 400 units TABS   No current facility-administered medications for this encounter.     Jodell Cipro, PA-C [facility] Pre-Surgical Testing [phone]

## 2020-09-06 NOTE — Anesthesia Preprocedure Evaluation (Addendum)
Anesthesia Evaluation  Patient identified by MRN, date of birth, ID band Patient awake    Reviewed: Allergy & Precautions, NPO status , Patient's Chart, lab work & pertinent test results  Airway Mallampati: II  TM Distance: >3 FB Neck ROM: Full    Dental no notable dental hx.    Pulmonary neg pulmonary ROS,    Pulmonary exam normal breath sounds clear to auscultation       Cardiovascular hypertension, Normal cardiovascular exam+ dysrhythmias Atrial Fibrillation  Rhythm:Regular Rate:Normal     Neuro/Psych negative neurological ROS  negative psych ROS   GI/Hepatic negative GI ROS, Neg liver ROS,   Endo/Other  negative endocrine ROS  Renal/GU negative Renal ROS  negative genitourinary   Musculoskeletal  (+) Arthritis , Osteoarthritis,    Abdominal   Peds negative pediatric ROS (+)  Hematology  (+) Blood dyscrasia, , Patient on eliquis   Anesthesia Other Findings   Reproductive/Obstetrics negative OB ROS                           Anesthesia Physical Anesthesia Plan  ASA: III  Anesthesia Plan: General   Post-op Pain Management:    Induction: Intravenous  PONV Risk Score and Plan: 3 and Ondansetron, Dexamethasone and Treatment may vary due to age or medical condition  Airway Management Planned: Oral ETT  Additional Equipment:   Intra-op Plan:   Post-operative Plan: Extubation in OR  Informed Consent: I have reviewed the patients History and Physical, chart, labs and discussed the procedure including the risks, benefits and alternatives for the proposed anesthesia with the patient or authorized representative who has indicated his/her understanding and acceptance.     Dental advisory given  Plan Discussed with: CRNA and Surgeon  Anesthesia Plan Comments: (See PAT note 09/05/2020, Jodell Cipro, PA-C)       Anesthesia Quick Evaluation

## 2020-09-09 ENCOUNTER — Other Ambulatory Visit (HOSPITAL_COMMUNITY): Payer: Medicare Other

## 2020-09-12 ENCOUNTER — Other Ambulatory Visit (HOSPITAL_COMMUNITY)
Admission: RE | Admit: 2020-09-12 | Discharge: 2020-09-12 | Disposition: A | Discharge status: Home / Self Care | Payer: Medicare Other | Source: Ambulatory Visit | Attending: Orthopedic Surgery | Admitting: Orthopedic Surgery

## 2020-09-12 DIAGNOSIS — Z20822 Contact with and (suspected) exposure to covid-19: Secondary | ICD-10-CM | POA: Insufficient documentation

## 2020-09-12 DIAGNOSIS — Z01812 Encounter for preprocedural laboratory examination: Secondary | ICD-10-CM | POA: Insufficient documentation

## 2020-09-12 LAB — SARS CORONAVIRUS 2 (TAT 6-24 HRS): SARS Coronavirus 2: NEGATIVE

## 2020-09-12 NOTE — H&P (Signed)
TOTAL HIP REVISION ADMISSION H&P  Patient is admitted for conversion of previous left hip surgery to THA  Subjective:  Chief Complaint: Failed left hip ORIF with OA   HPI: [name], 86 y.o. female, has a history of pain and functional disability in the left hip due to trauma and arthritis and patient has failed non-surgical conservative treatments for greater than 12 weeks to include NSAID's and/or analgesics, use of assistive devices and activity modification. The indications for the revision total hip arthroplasty are failed previous ORIF with OA.  Onset of symptoms was abrupt starting 5 months ago with gradually worsening course since that time.  Prior procedures on the left hip include ORIF.  Patient currently rates pain in the left hip at 10 out of 10 with activity.  There is night pain, worsening of pain with activity and weight bearing, trendelenberg gait, pain that interfers with activities of daily living and pain with passive range of motion. Patient has evidence of previous ORIF with OA by imaging studies.  This condition presents safety issues increasing the risk of falls.   There is no current active infection.  Risks, benefits and expectations were discussed with the patient.  Risks including but not limited to the risk of anesthesia, blood clots, nerve damage, blood vessel damage, failure of the prosthesis, infection and up to and including death.  Patient understand the risks, benefits and expectations and wishes to proceed with surgery.   PCP: Patient, No Pcp Per  D/C Plans:       Home   Post-op Meds:       No Rx given   Tranexamic Acid:      To be given - IV   Decadron:      Is to be given  FYI:      Eliquis  Oxycodone (on pre-op)  DME:   Pt already has equipment  PT:   [facility]  Pharmacy: [facility] [place]    Patient Active Problem List   Diagnosis Date Noted  . Paroxysmal atrial fibrillation (HCC) 07/21/2020   Past Medical History:  Diagnosis Date  . Arthritis    . Atrial fibrillation, transient (HCC)    post op hip surgery; May 2021  . Diverticulitis   . Dyspnea    with exertion   . GERD (gastroesophageal reflux disease)   . Hyperlipidemia   . Hypertension   . Stroke (HCC)    hx opf mini strokes   . TIA (transient ischemic attack)     Past Surgical History:  Procedure Laterality Date  . HIP FRACTURE SURGERY Left 04/16/2020  . KNEE SURGERY    . WRIST SURGERY      No current facility-administered medications for this encounter.   Current Outpatient Medications  Medication Sig Dispense Refill Last Dose  . acetaminophen (TYLENOL) 500 MG tablet Take 500-1,000 mg by mouth every 6 (six) hours as needed (for pain.).     Marland Kitchen amLODipine (NORVASC) 10 MG tablet Take 10 mg by mouth every evening.      Marland Kitchen apixaban (ELIQUIS) 5 MG TABS tablet Take 1 tablet (5 mg total) by mouth 2 (two) times daily. 60 tablet 11   . Ascorbic Acid (VITAMIN C) 1000 MG tablet Take 1,000 mg by mouth daily.     . benazepril (LOTENSIN) 20 MG tablet Take 20 mg by mouth 2 (two) times daily.      . calcium carbonate (TUMS - DOSED IN MG ELEMENTAL CALCIUM) 500 MG chewable tablet Chew 1-2 tablets by  mouth 3 (three) times daily as needed for indigestion or heartburn.     . cetirizine (ZYRTEC) 10 MG tablet Take 10 mg by mouth daily.      . Cholecalciferol (VITAMIN D3) 125 MCG (5000 UT) TABS Take 5,000 Units by mouth daily.     . diclofenac Sodium (VOLTAREN) 1 % GEL Apply 1 application topically 4 (four) times daily as needed (pain.).     Marland Kitchen ferrous sulfate (FERROUSUL) 325 (65 FE) MG tablet Take by mouth daily with breakfast.     . gabapentin (NEURONTIN) 300 MG capsule Take 300 mg by mouth 3 (three) times daily.     Marland Kitchen labetalol (NORMODYNE) 200 MG tablet Take 200 mg by mouth 2 (two) times daily.     . Multiple Vitamin (MULTIVITAMIN WITH MINERALS) TABS tablet Take 1 tablet by mouth daily.     . Multiple Vitamins-Minerals (PRESERVISION AREDS 2 PO) Take 1 tablet by mouth daily.     Marland Kitchen  oxyCODONE-acetaminophen (PERCOCET/ROXICET) 5-325 MG tablet Take 0.5-1 tablets by mouth 2 (two) times daily as needed for severe pain (pain.).     Marland Kitchen Polyethylene Glycol 3350 (MIRALAX PO) Take 17 g by mouth at bedtime.      . Potassium Chloride (KCL-20 PO) Take 20 mEq by mouth daily with supper.      . pravastatin (PRAVACHOL) 20 MG tablet Take 20 mg by mouth at bedtime.      Marland Kitchen spironolactone (ALDACTONE) 25 MG tablet Take 25 mg by mouth daily.      . sucralfate (CARAFATE) 1 g tablet Take 1 g by mouth 4 (four) times daily -  with meals and at bedtime.     . temazepam (RESTORIL) 30 MG capsule Take 30 mg by mouth at bedtime.      . vitamin B-12 (CYANOCOBALAMIN) 1000 MCG tablet Take 1,000 mcg by mouth daily.     . Vitamin E 400 units TABS Take 400 Units by mouth daily.       Allergies  Allergen Reactions  . Keflex [Cephalexin] Other (See Comments)    "makes pt feel really bad"    Social History   Tobacco Use  . Smoking status: Never Smoker  . Smokeless tobacco: Never Used  Substance Use Topics  . Alcohol use: Never    Family History  Problem Relation Age of Onset  . Diabetes Mother      Review of Systems  Constitutional: Negative.   HENT: Negative.   Eyes: Negative.   Respiratory: Negative.   Cardiovascular: Negative.   Gastrointestinal: Negative.   Genitourinary: Negative.   Musculoskeletal: Positive for joint pain.  Skin: Negative.   Neurological: Negative.   Endo/Heme/Allergies: Negative.   Psychiatric/Behavioral: Negative.       Objective:  Physical Exam Constitutional:      Appearance: She is well-developed.  HENT:     Head: Normocephalic.  Eyes:     Pupils: Pupils are equal, round, and reactive to light.  Neck:     Thyroid: No thyromegaly.     Vascular: No JVD.     Trachea: No tracheal deviation.  Cardiovascular:     Rate and Rhythm: Normal rate and regular rhythm.  Pulmonary:     Effort: Pulmonary effort is normal. No respiratory distress.     Breath  sounds: Normal breath sounds. No wheezing.  Abdominal:     Palpations: Abdomen is soft.     Tenderness: There is no abdominal tenderness. There is no guarding.  Musculoskeletal:     Cervical  back: Neck supple.     Left hip: Tenderness and bony tenderness present. Decreased range of motion. Decreased strength.  Lymphadenopathy:     Cervical: No cervical adenopathy.  Skin:    General: Skin is warm and dry.  Neurological:     Mental Status: She is alert and oriented to person, place, and time.     Sensory: Sensory deficit (numbness in toes) present.        Labs:  Estimated body mass index is 27.78 kg/m as calculated from the following:   Height as of 09/05/20: 5\' 7"  (1.702 m).   Weight as of 07/21/20: 80.5 kg.  Imaging Review:  Plain radiographs demonstrate severe degenerative joint disease of the left hip(s). There is evidence of loosening of the previous ORIF.The bone quality appears to be good for age and reported activity level.     Assessment/Plan:  End stage arthritis, left hip(s) with failed previous ORIF.  The patient history, physical examination, clinical judgement of the provider and imaging studies are consistent with end stage degenerative joint disease of the left hip(s), previous ORIF. Revision total hip arthroplasty is deemed medically necessary. The treatment options including medical management, injection therapy, arthroscopy and arthroplasty were discussed at length. The risks and benefits of total hip arthroplasty were presented and reviewed. The risks due to aseptic loosening, infection, stiffness, dislocation/subluxation,  thromboembolic complications and other imponderables were discussed.  The patient acknowledged the explanation, agreed to proceed with the plan and consent was signed. Patient is being admitted for treatment for surgery, pain control, PT, OT, prophylactic antibiotics, VTE prophylaxis, progressive ambulation and ADL's and discharge planning. The  patient is planning to be discharged home.     09/20/20 Jamear Carbonneau   PA-C  09/12/2020, 3:22 PM

## 2020-09-13 ENCOUNTER — Inpatient Hospital Stay (HOSPITAL_COMMUNITY)
Admission: RE | Admit: 2020-09-13 | Discharge: 2020-09-17 | DRG: 522 | Disposition: A | Discharge status: Skilled Nursing Facility | Payer: Medicare Other | Attending: Orthopedic Surgery | Admitting: Orthopedic Surgery

## 2020-09-13 ENCOUNTER — Other Ambulatory Visit: Payer: Self-pay

## 2020-09-13 ENCOUNTER — Observation Stay (HOSPITAL_COMMUNITY): Payer: Medicare Other

## 2020-09-13 ENCOUNTER — Encounter (HOSPITAL_COMMUNITY): Payer: Self-pay | Admitting: Orthopedic Surgery

## 2020-09-13 ENCOUNTER — Ambulatory Visit (HOSPITAL_COMMUNITY): Payer: Medicare Other | Admitting: Certified Registered"

## 2020-09-13 ENCOUNTER — Encounter (HOSPITAL_COMMUNITY)
Admission: RE | Disposition: A | Discharge status: Skilled Nursing Facility | Payer: Self-pay | Source: Home / Self Care | Attending: Orthopedic Surgery

## 2020-09-13 ENCOUNTER — Ambulatory Visit (HOSPITAL_COMMUNITY): Payer: Medicare Other | Admitting: Physician Assistant

## 2020-09-13 DIAGNOSIS — Z01812 Encounter for preprocedural laboratory examination: Secondary | ICD-10-CM

## 2020-09-13 DIAGNOSIS — Z79899 Other long term (current) drug therapy: Secondary | ICD-10-CM

## 2020-09-13 DIAGNOSIS — Z8673 Personal history of transient ischemic attack (TIA), and cerebral infarction without residual deficits: Secondary | ICD-10-CM

## 2020-09-13 DIAGNOSIS — Z20822 Contact with and (suspected) exposure to covid-19: Secondary | ICD-10-CM | POA: Diagnosis present

## 2020-09-13 DIAGNOSIS — I10 Essential (primary) hypertension: Secondary | ICD-10-CM | POA: Diagnosis present

## 2020-09-13 DIAGNOSIS — Z96649 Presence of unspecified artificial hip joint: Secondary | ICD-10-CM

## 2020-09-13 DIAGNOSIS — I480 Paroxysmal atrial fibrillation: Secondary | ICD-10-CM | POA: Diagnosis present

## 2020-09-13 DIAGNOSIS — Z7901 Long term (current) use of anticoagulants: Secondary | ICD-10-CM

## 2020-09-13 DIAGNOSIS — Z6825 Body mass index (BMI) 25.0-25.9, adult: Secondary | ICD-10-CM

## 2020-09-13 DIAGNOSIS — E785 Hyperlipidemia, unspecified: Secondary | ICD-10-CM | POA: Diagnosis present

## 2020-09-13 DIAGNOSIS — Z833 Family history of diabetes mellitus: Secondary | ICD-10-CM

## 2020-09-13 DIAGNOSIS — K219 Gastro-esophageal reflux disease without esophagitis: Secondary | ICD-10-CM | POA: Diagnosis present

## 2020-09-13 DIAGNOSIS — Z96642 Presence of left artificial hip joint: Secondary | ICD-10-CM

## 2020-09-13 DIAGNOSIS — S72002K Fracture of unspecified part of neck of left femur, subsequent encounter for closed fracture with nonunion: Principal | ICD-10-CM

## 2020-09-13 DIAGNOSIS — M1612 Unilateral primary osteoarthritis, left hip: Secondary | ICD-10-CM | POA: Diagnosis present

## 2020-09-13 DIAGNOSIS — E663 Overweight: Secondary | ICD-10-CM | POA: Diagnosis present

## 2020-09-13 HISTORY — PX: CONVERSION TO TOTAL HIP: SHX5784

## 2020-09-13 LAB — TYPE AND SCREEN
ABO/RH(D): O POS
Antibody Screen: NEGATIVE

## 2020-09-13 LAB — ABO/RH: ABO/RH(D): O POS

## 2020-09-13 SURGERY — CONVERSION, PREVIOUS HIP SURGERY, TO TOTAL HIP ARTHROPLASTY
Anesthesia: General | Site: Hip | Laterality: Left

## 2020-09-13 MED ORDER — FENTANYL CITRATE (PF) 250 MCG/5ML IJ SOLN
INTRAMUSCULAR | Status: DC | PRN
  Administered 2020-09-13 (×2): 25 ug via INTRAVENOUS
  Administered 2020-09-13: 50 ug via INTRAVENOUS
  Administered 2020-09-13 (×4): 25 ug via INTRAVENOUS

## 2020-09-13 MED ORDER — STERILE WATER FOR IRRIGATION IR SOLN
Status: DC | PRN
  Administered 2020-09-13: 2000 mL

## 2020-09-13 MED ORDER — OXYCODONE HCL 5 MG PO TABS
10.0000 mg | ORAL_TABLET | ORAL | Status: DC | PRN
  Administered 2020-09-14 – 2020-09-16 (×7): 10 mg via ORAL
  Filled 2020-09-13 (×7): qty 2

## 2020-09-13 MED ORDER — DEXAMETHASONE SODIUM PHOSPHATE 10 MG/ML IJ SOLN
10.0000 mg | Freq: Once | INTRAMUSCULAR | Status: AC
  Administered 2020-09-14: 10 mg via INTRAVENOUS
  Filled 2020-09-13: qty 1

## 2020-09-13 MED ORDER — TRANEXAMIC ACID-NACL 1000-0.7 MG/100ML-% IV SOLN
1000.0000 mg | Freq: Once | INTRAVENOUS | Status: AC
  Administered 2020-09-13: 1000 mg via INTRAVENOUS
  Filled 2020-09-13: qty 100

## 2020-09-13 MED ORDER — MAGNESIUM CITRATE PO SOLN: 1.0000 | Freq: Once | ORAL | Status: DC | PRN

## 2020-09-13 MED ORDER — ORAL CARE MOUTH RINSE: 15.0000 mL | Freq: Once | OROMUCOSAL | Status: AC

## 2020-09-13 MED ORDER — FERROUS SULFATE 325 (65 FE) MG PO TABS
325.0000 mg | ORAL_TABLET | Freq: Three times a day (TID) | ORAL | Status: DC
  Administered 2020-09-14 – 2020-09-17 (×10): 325 mg via ORAL
  Filled 2020-09-13 (×10): qty 1

## 2020-09-13 MED ORDER — APIXABAN 2.5 MG PO TABS
2.5000 mg | ORAL_TABLET | Freq: Two times a day (BID) | ORAL | Status: DC
  Administered 2020-09-14 – 2020-09-17 (×7): 2.5 mg via ORAL
  Filled 2020-09-13 (×7): qty 1

## 2020-09-13 MED ORDER — ONDANSETRON HCL 4 MG PO TABS
4.0000 mg | ORAL_TABLET | Freq: Four times a day (QID) | ORAL | Status: DC | PRN
  Administered 2020-09-15 – 2020-09-17 (×2): 4 mg via ORAL
  Filled 2020-09-13 (×4): qty 1

## 2020-09-13 MED ORDER — DEXAMETHASONE SODIUM PHOSPHATE 10 MG/ML IJ SOLN
10.0000 mg | Freq: Once | INTRAMUSCULAR | Status: AC
  Administered 2020-09-13: 10 mg via INTRAVENOUS

## 2020-09-13 MED ORDER — CEFAZOLIN SODIUM-DEXTROSE 2-4 GM/100ML-% IV SOLN
2.0000 g | INTRAVENOUS | Status: AC
  Administered 2020-09-13: 2 g via INTRAVENOUS
  Filled 2020-09-13: qty 100

## 2020-09-13 MED ORDER — CHLORHEXIDINE GLUCONATE 0.12 % MT SOLN
15.0000 mL | Freq: Once | OROMUCOSAL | Status: AC
  Administered 2020-09-13: 15 mL via OROMUCOSAL

## 2020-09-13 MED ORDER — DOCUSATE SODIUM 100 MG PO CAPS
100.0000 mg | ORAL_CAPSULE | Freq: Two times a day (BID) | ORAL | Status: DC
  Administered 2020-09-13 – 2020-09-17 (×8): 100 mg via ORAL
  Filled 2020-09-13 (×8): qty 1

## 2020-09-13 MED ORDER — OXYCODONE HCL 5 MG PO TABS
5.0000 mg | ORAL_TABLET | ORAL | Status: DC | PRN
  Administered 2020-09-13 – 2020-09-17 (×9): 5 mg via ORAL
  Filled 2020-09-13 (×9): qty 1

## 2020-09-13 MED ORDER — BISACODYL 10 MG RE SUPP
10.0000 mg | Freq: Every day | RECTAL | Status: DC | PRN
  Administered 2020-09-16: 10 mg via RECTAL
  Filled 2020-09-13: qty 1

## 2020-09-13 MED ORDER — DEXAMETHASONE SODIUM PHOSPHATE 10 MG/ML IJ SOLN
INTRAMUSCULAR | Status: AC
  Filled 2020-09-13: qty 1

## 2020-09-13 MED ORDER — ORAL CARE MOUTH RINSE: 15.0000 mL | Freq: Once | OROMUCOSAL | Status: DC

## 2020-09-13 MED ORDER — SODIUM CHLORIDE 0.9 % IV SOLN: INTRAVENOUS | Status: DC

## 2020-09-13 MED ORDER — 0.9 % SODIUM CHLORIDE (POUR BTL) OPTIME
TOPICAL | Status: DC | PRN
  Administered 2020-09-13: 1000 mL

## 2020-09-13 MED ORDER — AMISULPRIDE (ANTIEMETIC) 5 MG/2ML IV SOLN
INTRAVENOUS | Status: AC
  Administered 2020-09-13: 10 mg via INTRAVENOUS
  Filled 2020-09-13: qty 4

## 2020-09-13 MED ORDER — ONDANSETRON HCL 4 MG/2ML IJ SOLN
4.0000 mg | Freq: Once | INTRAMUSCULAR | Status: AC | PRN
  Administered 2020-09-13: 4 mg via INTRAVENOUS

## 2020-09-13 MED ORDER — TRANEXAMIC ACID-NACL 1000-0.7 MG/100ML-% IV SOLN
1000.0000 mg | INTRAVENOUS | Status: AC
  Administered 2020-09-13: 1000 mg via INTRAVENOUS
  Filled 2020-09-13: qty 100

## 2020-09-13 MED ORDER — AMISULPRIDE (ANTIEMETIC) 5 MG/2ML IV SOLN: 10.0000 mg | Freq: Once | INTRAVENOUS | Status: AC

## 2020-09-13 MED ORDER — OXYCODONE HCL 5 MG/5ML PO SOLN: 5.0000 mg | Freq: Once | ORAL | Status: DC | PRN

## 2020-09-13 MED ORDER — ROCURONIUM BROMIDE 10 MG/ML (PF) SYRINGE
PREFILLED_SYRINGE | INTRAVENOUS | Status: AC
  Filled 2020-09-13: qty 10

## 2020-09-13 MED ORDER — LABETALOL HCL 100 MG PO TABS
200.0000 mg | ORAL_TABLET | Freq: Two times a day (BID) | ORAL | Status: DC
  Administered 2020-09-13 – 2020-09-17 (×8): 200 mg via ORAL
  Filled 2020-09-13 (×8): qty 2

## 2020-09-13 MED ORDER — ONDANSETRON HCL 4 MG/2ML IJ SOLN
INTRAMUSCULAR | Status: AC
  Filled 2020-09-13: qty 2

## 2020-09-13 MED ORDER — METHOCARBAMOL 500 MG PO TABS
500.0000 mg | ORAL_TABLET | Freq: Four times a day (QID) | ORAL | Status: DC | PRN
  Administered 2020-09-13 – 2020-09-16 (×8): 500 mg via ORAL
  Filled 2020-09-13 (×8): qty 1

## 2020-09-13 MED ORDER — HYDROMORPHONE HCL 1 MG/ML IJ SOLN
INTRAMUSCULAR | Status: AC
  Filled 2020-09-13: qty 1

## 2020-09-13 MED ORDER — CHLORHEXIDINE GLUCONATE 0.12 % MT SOLN: 15.0000 mL | Freq: Once | OROMUCOSAL | Status: DC

## 2020-09-13 MED ORDER — MENTHOL 3 MG MT LOZG: 1.0000 | LOZENGE | OROMUCOSAL | Status: DC | PRN

## 2020-09-13 MED ORDER — SUCRALFATE 1 G PO TABS
1.0000 g | ORAL_TABLET | Freq: Three times a day (TID) | ORAL | Status: DC
  Administered 2020-09-13 – 2020-09-17 (×13): 1 g via ORAL
  Filled 2020-09-13 (×13): qty 1

## 2020-09-13 MED ORDER — DIPHENHYDRAMINE HCL 12.5 MG/5ML PO ELIX: 12.5000 mg | ORAL_SOLUTION | ORAL | Status: DC | PRN

## 2020-09-13 MED ORDER — SUGAMMADEX SODIUM 200 MG/2ML IV SOLN
INTRAVENOUS | Status: DC | PRN
  Administered 2020-09-13: 200 mg via INTRAVENOUS

## 2020-09-13 MED ORDER — METHOCARBAMOL 500 MG IVPB - SIMPLE MED
500.0000 mg | Freq: Four times a day (QID) | INTRAVENOUS | Status: DC | PRN
  Administered 2020-09-13: 500 mg via INTRAVENOUS
  Filled 2020-09-13: qty 50

## 2020-09-13 MED ORDER — PROPOFOL 10 MG/ML IV BOLUS
INTRAVENOUS | Status: DC | PRN
  Administered 2020-09-13: 100 mg via INTRAVENOUS

## 2020-09-13 MED ORDER — METOCLOPRAMIDE HCL 5 MG PO TABS: 5.0000 mg | ORAL_TABLET | Freq: Three times a day (TID) | ORAL | Status: DC | PRN

## 2020-09-13 MED ORDER — LIDOCAINE 2% (20 MG/ML) 5 ML SYRINGE
INTRAMUSCULAR | Status: DC | PRN
  Administered 2020-09-13: 100 mg via INTRAVENOUS

## 2020-09-13 MED ORDER — HYDROMORPHONE HCL 1 MG/ML IJ SOLN
0.2500 mg | INTRAMUSCULAR | Status: DC | PRN
  Administered 2020-09-13 (×3): 0.5 mg via INTRAVENOUS

## 2020-09-13 MED ORDER — ROCURONIUM BROMIDE 10 MG/ML (PF) SYRINGE
PREFILLED_SYRINGE | INTRAVENOUS | Status: DC | PRN
  Administered 2020-09-13: 10 mg via INTRAVENOUS
  Administered 2020-09-13: 60 mg via INTRAVENOUS

## 2020-09-13 MED ORDER — PROPOFOL 10 MG/ML IV BOLUS
INTRAVENOUS | Status: AC
  Filled 2020-09-13: qty 20

## 2020-09-13 MED ORDER — ONDANSETRON HCL 4 MG/2ML IJ SOLN
INTRAMUSCULAR | Status: DC | PRN
  Administered 2020-09-13: 4 mg via INTRAVENOUS

## 2020-09-13 MED ORDER — LORATADINE 10 MG PO TABS
10.0000 mg | ORAL_TABLET | Freq: Every day | ORAL | Status: DC
  Administered 2020-09-14 – 2020-09-17 (×4): 10 mg via ORAL
  Filled 2020-09-13 (×4): qty 1

## 2020-09-13 MED ORDER — ONDANSETRON HCL 4 MG/2ML IJ SOLN: 4.0000 mg | Freq: Four times a day (QID) | INTRAMUSCULAR | Status: DC | PRN

## 2020-09-13 MED ORDER — ACETAMINOPHEN 500 MG PO TABS
1000.0000 mg | ORAL_TABLET | Freq: Three times a day (TID) | ORAL | Status: DC
  Administered 2020-09-13 – 2020-09-17 (×10): 1000 mg via ORAL
  Filled 2020-09-13 (×10): qty 2

## 2020-09-13 MED ORDER — HYDROMORPHONE HCL 1 MG/ML IJ SOLN: 0.2500 mg | INTRAMUSCULAR | Status: DC | PRN

## 2020-09-13 MED ORDER — SPIRONOLACTONE 25 MG PO TABS
25.0000 mg | ORAL_TABLET | Freq: Every day | ORAL | Status: DC
  Administered 2020-09-14 – 2020-09-17 (×4): 25 mg via ORAL
  Filled 2020-09-13 (×5): qty 1

## 2020-09-13 MED ORDER — PROMETHAZINE HCL 25 MG PO TABS: 12.5000 mg | ORAL_TABLET | Freq: Four times a day (QID) | ORAL | Status: DC | PRN

## 2020-09-13 MED ORDER — GABAPENTIN 300 MG PO CAPS
300.0000 mg | ORAL_CAPSULE | Freq: Three times a day (TID) | ORAL | Status: DC
  Administered 2020-09-13 – 2020-09-17 (×11): 300 mg via ORAL
  Filled 2020-09-13 (×11): qty 1

## 2020-09-13 MED ORDER — FENTANYL CITRATE (PF) 100 MCG/2ML IJ SOLN
INTRAMUSCULAR | Status: AC
  Filled 2020-09-13: qty 2

## 2020-09-13 MED ORDER — CEFAZOLIN SODIUM-DEXTROSE 2-4 GM/100ML-% IV SOLN
2.0000 g | Freq: Four times a day (QID) | INTRAVENOUS | Status: AC
  Administered 2020-09-13 (×2): 2 g via INTRAVENOUS
  Filled 2020-09-13 (×2): qty 100

## 2020-09-13 MED ORDER — AMLODIPINE BESYLATE 10 MG PO TABS
10.0000 mg | ORAL_TABLET | Freq: Every evening | ORAL | Status: DC
  Administered 2020-09-14 – 2020-09-16 (×3): 10 mg via ORAL
  Filled 2020-09-13 (×3): qty 1

## 2020-09-13 MED ORDER — POLYETHYLENE GLYCOL 3350 17 G PO PACK
17.0000 g | PACK | Freq: Two times a day (BID) | ORAL | Status: DC
  Administered 2020-09-14 – 2020-09-16 (×4): 17 g via ORAL
  Filled 2020-09-13 (×7): qty 1

## 2020-09-13 MED ORDER — LACTATED RINGERS IV SOLN: INTRAVENOUS | Status: DC

## 2020-09-13 MED ORDER — OXYCODONE HCL 5 MG PO TABS: 5.0000 mg | ORAL_TABLET | Freq: Once | ORAL | Status: DC | PRN

## 2020-09-13 MED ORDER — METHOCARBAMOL 500 MG IVPB - SIMPLE MED
INTRAVENOUS | Status: AC
  Filled 2020-09-13: qty 50

## 2020-09-13 MED ORDER — PRAVASTATIN SODIUM 20 MG PO TABS
20.0000 mg | ORAL_TABLET | Freq: Every day | ORAL | Status: DC
  Administered 2020-09-13 – 2020-09-16 (×4): 20 mg via ORAL
  Filled 2020-09-13 (×4): qty 1

## 2020-09-13 MED ORDER — ALUM & MAG HYDROXIDE-SIMETH 200-200-20 MG/5ML PO SUSP
15.0000 mL | ORAL | Status: DC | PRN
  Administered 2020-09-14 – 2020-09-15 (×2): 15 mL via ORAL
  Filled 2020-09-13 (×2): qty 30

## 2020-09-13 MED ORDER — TEMAZEPAM 15 MG PO CAPS
30.0000 mg | ORAL_CAPSULE | Freq: Every day | ORAL | Status: DC
  Administered 2020-09-13 – 2020-09-16 (×4): 30 mg via ORAL
  Filled 2020-09-13 (×4): qty 2

## 2020-09-13 MED ORDER — METOCLOPRAMIDE HCL 5 MG/ML IJ SOLN
5.0000 mg | Freq: Three times a day (TID) | INTRAMUSCULAR | Status: DC | PRN
  Administered 2020-09-13: 10 mg via INTRAVENOUS
  Filled 2020-09-13: qty 2

## 2020-09-13 MED ORDER — PHENOL 1.4 % MT LIQD: 1.0000 | OROMUCOSAL | Status: DC | PRN

## 2020-09-13 MED ORDER — PROMETHAZINE HCL 25 MG/ML IJ SOLN
12.5000 mg | Freq: Four times a day (QID) | INTRAMUSCULAR | Status: DC | PRN
  Administered 2020-09-13: 12.5 mg via INTRAVENOUS
  Filled 2020-09-13: qty 1

## 2020-09-13 MED ORDER — HYDROMORPHONE HCL 1 MG/ML IJ SOLN
0.5000 mg | INTRAMUSCULAR | Status: DC | PRN
  Administered 2020-09-13 (×2): 1 mg via INTRAVENOUS
  Filled 2020-09-13 (×2): qty 1

## 2020-09-13 MED ORDER — LIDOCAINE 2% (20 MG/ML) 5 ML SYRINGE
INTRAMUSCULAR | Status: AC
  Filled 2020-09-13: qty 5

## 2020-09-13 SURGICAL SUPPLY — 51 items
BAG ZIPLOCK 12X15 (MISCELLANEOUS) ×3 IMPLANT
BLADE SAW SGTL 18X1.27X75 (BLADE) ×2 IMPLANT
BLADE SAW SGTL 18X1.27X75MM (BLADE) ×1
COVER SURGICAL LIGHT HANDLE (MISCELLANEOUS) ×3 IMPLANT
COVER WAND RF STERILE (DRAPES) IMPLANT
CUP ACETBLR 54 OD PINNACLE (Hips) ×3 IMPLANT
DERMABOND ADVANCED (GAUZE/BANDAGES/DRESSINGS) ×4
DERMABOND ADVANCED .7 DNX12 (GAUZE/BANDAGES/DRESSINGS) ×2 IMPLANT
DRAPE INCISE IOBAN 85X60 (DRAPES) ×3 IMPLANT
DRAPE ORTHO SPLIT 77X108 STRL (DRAPES) ×4
DRAPE POUCH INSTRU U-SHP 10X18 (DRAPES) ×3 IMPLANT
DRAPE SURG ORHT 6 SPLT 77X108 (DRAPES) ×2 IMPLANT
DRAPE U-SHAPE 47X51 STRL (DRAPES) ×6 IMPLANT
DRESSING AQUACEL AG SP 3.5X10 (GAUZE/BANDAGES/DRESSINGS) IMPLANT
DRSG AQUACEL AG ADV 3.5X14 (GAUZE/BANDAGES/DRESSINGS) ×3 IMPLANT
DRSG AQUACEL AG SP 3.5X10 (GAUZE/BANDAGES/DRESSINGS)
DURAPREP 26ML APPLICATOR (WOUND CARE) ×3 IMPLANT
ELECT REM PT RETURN 15FT ADLT (MISCELLANEOUS) IMPLANT
ELIMINATOR HOLE APEX DEPUY (Hips) ×3 IMPLANT
GLOVE BIOGEL PI IND STRL 7.5 (GLOVE) ×2 IMPLANT
GLOVE BIOGEL PI IND STRL 8.5 (GLOVE) ×1 IMPLANT
GLOVE BIOGEL PI INDICATOR 7.5 (GLOVE) ×4
GLOVE BIOGEL PI INDICATOR 8.5 (GLOVE) ×2
GLOVE ECLIPSE 8.0 STRL XLNG CF (GLOVE) ×3 IMPLANT
GLOVE ORTHO TXT STRL SZ7.5 (GLOVE) ×12 IMPLANT
GOWN STRL REUS W/TWL 2XL LVL3 (GOWN DISPOSABLE) ×3 IMPLANT
GOWN STRL REUS W/TWL LRG LVL3 (GOWN DISPOSABLE) ×3 IMPLANT
HEAD M SROM 36MM PLUS 1.5 (Hips) ×1 IMPLANT
KIT BASIN OR (CUSTOM PROCEDURE TRAY) ×3 IMPLANT
KIT TURNOVER KIT A (KITS) IMPLANT
LINER NEUTRAL 54X36MM PLUS 4 (Hips) ×3 IMPLANT
MANIFOLD NEPTUNE II (INSTRUMENTS) ×3 IMPLANT
NS IRRIG 1000ML POUR BTL (IV SOLUTION) ×3 IMPLANT
PACK TOTAL JOINT (CUSTOM PROCEDURE TRAY) ×3 IMPLANT
PENCIL SMOKE EVACUATOR (MISCELLANEOUS) IMPLANT
PROTECTOR NERVE ULNAR (MISCELLANEOUS) ×3 IMPLANT
SCREW 6.5MMX30MM (Screw) ×3 IMPLANT
SCREW 6.5MMX40MM (Screw) ×3 IMPLANT
SROM M HEAD 36MM PLUS 1.5 (Hips) ×3 IMPLANT
STEM FEMORAL SZ 5MM STD ACTIS (Stem) ×3 IMPLANT
SUCTION FRAZIER HANDLE 12FR (TUBING) ×2
SUCTION TUBE FRAZIER 12FR DISP (TUBING) ×1 IMPLANT
SUT MNCRL AB 4-0 PS2 18 (SUTURE) ×6 IMPLANT
SUT STRATAFIX 0 PDS 27 VIOLET (SUTURE) ×3
SUT VIC AB 1 CT1 36 (SUTURE) ×9 IMPLANT
SUT VIC AB 2-0 CT1 27 (SUTURE) ×6
SUT VIC AB 2-0 CT1 TAPERPNT 27 (SUTURE) ×3 IMPLANT
SUTURE STRATFX 0 PDS 27 VIOLET (SUTURE) ×1 IMPLANT
TOWEL OR 17X26 10 PK STRL BLUE (TOWEL DISPOSABLE) ×6 IMPLANT
TRAY FOLEY MTR SLVR 16FR STAT (SET/KITS/TRAYS/PACK) ×3 IMPLANT
WATER STERILE IRR 1000ML POUR (IV SOLUTION) ×6 IMPLANT

## 2020-09-13 NOTE — Op Note (Signed)
[name], [name] MEDICAL RECORD RS:[id number] ACCOUNT [id number] DATE OF BIRTH:[birth year] FACILITY: [facility] LOCATION: [facility] PHYSICIAN:Gwenivere Hiraldo D. 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  PROCEDURE:  Conversion of failed left hip surgery to total hip arthroplasty.  COMPONENTS USED:  A DePuy hip system with a size 54 Pinnacle shell, a 36+4 neutral AltrX liner, size 5 standard Actis stem with a 36+1.5 Articul/eze metal head ball.  SURGEON:  Durene Romans, MD  ASSISTANT:  Lanney Gins, PA-C.  Note that Mr. Carmon Sails was present for the entirety of the case from preoperative positioning, perioperative management of the operative extremity, general facilitation of the case and primary wound closure.  ANESTHESIA:  General.  SPECIMENS:  None.  COMPLICATIONS:  None.  DRAINS:  None.  BLOOD LOSS:  Approximately 350 mL.  INDICATIONS:  The patient is an 86 year old female with history of ground level fall.  She was treated with an open reduction internal fixation in her hometown in the [place].  Her family brought her here for rehabilitation.  She was seen and evaluated  in the postoperative period with a colleague, Dr. Myrene Galas.  She was having persistent pain in the left hip.  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  She was referred for conversion  to total hip replacement to improve pain and quality of life.  We reviewed the risks of infection, DVT, dislocation, need for future surgery.  We discussed the challenges of the case as well as her postoperative course with limited weightbearing to allow  for  healing.  Consent was obtained for benefit of pain relief.  DESCRIPTION OF PROCEDURE:  The patient was brought to the operative theater.  Once adequate anesthesia, preoperative antibiotics, Ancef administered, she was positioned into the right lateral decubitus position with left hip up.  The left lower extremity  was prepped and draped in sterile fashion.  Timeout was performed identifying the patient, planned procedure, and extremity.  At this point,  her previous incisions were marked on her skin.  I outlined an incision for posterior approach to the hip  through her old lateral incision for  troch entry as well as the distal interlock incision.  First, we removed the distal interlock by incising the skin through the iliotibial band.  We did identify the screw.  The previously placed nail was a Social research officer, government nail.  We did not have that system available.  We used the Winquist hardware removal system and was able to find a screwdriver head that matched this distal interlock.  The screw was removed.  I then exposed proximally for the hip.  In  doing so, we identified the lateral compression screws.  Per the protocol, we removed the inferior screw using the same screwdriver head that we used for the distal interlock.  We then used an osteotome to remove the larger lag screw, which eventually  just pulled out of the bone.  At this point, I exposed the proximal tip of the trochanter, identified the tip of the previously placed nail and removed it with a rongeur without difficulty.  Once this was done, we evaluated and found that the trochanter  seemed to be  stable.  We then rolled the hip up in internal rotation and exposed the posterior aspect of the hip routinely.  The posterior capsule and external rotators were taken down as a single layer.  The posterior aspect of the hip was exposed.  We  identified nonunion at the base of the femoral neck.  This area was exposed and cut in situ.  The  femoral head was then removed.  We then rolled the hip further and flexed the hip up into internal rotation and worked on femoral preparation.  Exposed the  proximal lateral femur, removing some overhanging trochanter.  I then used a starting drill to open up the proximal femur, then used a box osteotome to lateralize.  We then used the starting broach, followed in sequential numbers, initially to a size 4.   I left the 4 broach in place with basically anatomic position of her anteversion.  I kept this in place to protect the femur from the retractors during the case.  At this point, the femur was retracted anteriorly.  The posterior aspect of the hip was exposed.  Soft tissues and labrum were debrided.  I began reaming with a 44 mm reamer, reamed up to 53 mm, which had good bone preparation.  The final 54 Pinnacle  shell was opened and then impacted.  I confirmed the orientation of the cup to be about 20 degrees of forward flexion and 40 degrees of abduction.  Two cancellous screws were placed in the ilium due to the osteopenic nature of her bone.  Following this,  the final 36+4 neutral AltrX liner was then impacted with good visualized rim fit.  At this point, we did a trial reduction with a 4 broach in place, the standard neck based on the offset appearance on her radiographs from her contralateral hip and did a  trial reduction.  The trial reduction to me felt that she had about 45 degrees to 50 degrees of combined anteversion.  Her leg lengths appeared to be equal to the down leg.  There was no shuck in extension.  There was no evidence of impingement with  forward flexion, internal rotation to at least 70 to 80 degrees, no evidence of impingement in extension.  Given these findings, the trial components were dislocated.  When I removed the femoral head and neck, I assessed the proximal femur of the hip and  found there was a little bit of motion with a 4 broach in place.  Given this, I impacted it  further and then took this out and placed the 5 broach.  We selected the 5 standard Actis stem.  It was then impacted and sat at the level where the broach had  sat.  I did repeat a trial reduction and did confirm that the 1.5 ball felt stable without evidence of impingement and leg lengths appeared to be equal to the contralateral leg.  Given these findings, the hip was dislocated again and the final 36+1.5  Articul/eze metal ball was impacted onto a clean and dried trunnion.  The hip was reduced.  The hip was irrigated throughout the case and again at this point.  I reapproximated the iliotibial band and gluteal fascia using #1 Vicryl and #1 Stratafix  suture.  The remainder of the wounds were closed in layers with 2-0 Vicryl and a 3-0 Monocryl.  The hip was then cleaned, dried and dressed sterilely using surgical glue and Aquacel dressings on both the inferior incision as well as the  larger proximal  incision.  She was then brought to the recovery room in stable condition, tolerating the procedure well.  She will be admitted to the hospital.  We will have physical therapy work with her.  She will be partial weightbearing for upwards of 6 weeks to  allow for adequate healing of her soft tissues and bone.  We will begin working on range of motion and increasing her activity as she improves.  HN/NUANCE  D:09/13/2020 T:09/13/2020 JOB:[id number]

## 2020-09-13 NOTE — Brief Op Note (Signed)
09/13/2020  8:54 AM  PATIENT:  [name]  86 y.o. female  PRE-OPERATIVE DIAGNOSIS:  Failed left hip surgery, non-union left basicervical femoral neck fracture  POST-OPERATIVE DIAGNOSIS:  Failed left hip surgery, non-union left basicervical femoral neck fracture  PROCEDURE:  Procedure(s) with comments: CONVERSION TO LEFT TOTAL HIP-POSTERIOR APPROACH (Left) - 90 mins  SURGEON:  Surgeon(s) and Role:    Durene Romans, MD - Primary  PHYSICIAN ASSISTANT: Lanney Gins, PA-C  ANESTHESIA:   general  EBL:  350 cc   BLOOD ADMINISTERED:none  DRAINS: none   LOCAL MEDICATIONS USED:  NONE  SPECIMEN:  No Specimen  DISPOSITION OF SPECIMEN:  N/A  COUNTS:  YES  TOURNIQUET:  * No tourniquets in log *  DICTATION: .Other Dictation: Dictation Number [id number]  PLAN OF CARE: Admit to inpatient   PATIENT DISPOSITION:  PACU - hemodynamically stable.   Delay start of Pharmacological VTE agent (>24hrs) due to surgical blood loss or risk of bleeding: no

## 2020-09-13 NOTE — Discharge Instructions (Signed)

## 2020-09-13 NOTE — Anesthesia Postprocedure Evaluation (Signed)
Anesthesia Post Note  Patient: [name]  Procedure(s) Performed: CONVERSION TO LEFT TOTAL HIP-POSTERIOR APPROACH (Left Hip)     Patient location during evaluation: PACU Anesthesia Type: General Level of consciousness: awake and alert Pain management: pain level controlled Vital Signs Assessment: post-procedure vital signs reviewed and stable Respiratory status: spontaneous breathing, nonlabored ventilation, respiratory function stable and patient connected to nasal cannula oxygen Cardiovascular status: blood pressure returned to baseline and stable Postop Assessment: no apparent nausea or vomiting Anesthetic complications: no   No complications documented.  Last Vitals:  Vitals:   09/13/20 1243 09/13/20 1401  BP: (!) 162/64 128/62  Pulse: 60 61  Resp: 20 16  Temp:    SpO2: 100% 100%    Last Pain:  Vitals:   09/13/20 1335  TempSrc:   PainSc: 4                  Jammie Troup S

## 2020-09-13 NOTE — Anesthesia Procedure Notes (Signed)
Procedure Name: Intubation Date/Time: 09/13/2020 8:42 AM Performed by: Eben Burow, CRNA Pre-anesthesia Checklist: Patient identified, Emergency Drugs available, Suction available, Patient being monitored and Timeout performed Patient Re-evaluated:Patient Re-evaluated prior to induction Oxygen Delivery Method: Circle system utilized Preoxygenation: Pre-oxygenation with 100% oxygen Induction Type: IV induction Ventilation: Mask ventilation without difficulty Laryngoscope Size: Mac and 4 Grade View: Grade I Tube type: Oral Tube size: 7.0 mm Number of attempts: 1 Airway Equipment and Method: Stylet Placement Confirmation: ETT inserted through vocal cords under direct vision,  positive ETCO2 and breath sounds checked- equal and bilateral Secured at: 22 cm Tube secured with: Tape Dental Injury: Teeth and Oropharynx as per pre-operative assessment

## 2020-09-13 NOTE — Transfer of Care (Signed)
Immediate Anesthesia Transfer of Care Note  Patient: [name]  Procedure(s) Performed: CONVERSION TO LEFT TOTAL HIP-POSTERIOR APPROACH (Left Hip)  Patient Location: PACU  Anesthesia Type:General  Level of Consciousness: awake, alert  and patient cooperative  Airway & Oxygen Therapy: Patient Spontanous Breathing and Patient connected to face mask oxygen  Post-op Assessment: Report given to RN and Post -op Vital signs reviewed and stable  Post vital signs: Reviewed and stable  Last Vitals:  Vitals Value Taken Time  BP 139/68 09/13/20 1108  Temp    Pulse 65 09/13/20 1108  Resp 18 09/13/20 1108  SpO2 100 % 09/13/20 1108  Vitals shown include unvalidated device data.  Last Pain:  Vitals:   09/13/20 6945  TempSrc:   PainSc: 4       Patients Stated Pain Goal: 3 (09/13/20 0388)  Complications: No complications documented.

## 2020-09-13 NOTE — Interval H&P Note (Signed)
History and Physical Interval Note:  09/13/2020 6:56 AM  [name]  has presented today for surgery, with the diagnosis of Failed left hip surgery, malunion.  The various methods of treatment have been discussed with the patient and family. After consideration of risks, benefits and other options for treatment, the patient has consented to  Procedure(s) with comments: CONVERSION TO LEFT TOTAL HIP-POSTERIOR APPROACH (Left) - 90 mins as a surgical intervention.  The patient's history has been reviewed, patient examined, no change in status, stable for surgery.  I have reviewed the patient's chart and labs.  Questions were answered to the patient's satisfaction.     Shelda Pal

## 2020-09-13 NOTE — Progress Notes (Signed)
PT Cancellation Note  Patient Details Name: [name] MRN: [id number] DOB: December 20, 1932   Cancelled Treatment:    Reason Eval/Treat Not Completed:  Attempted PT eval- pt unable to participate 2* nausea, pain, and experiencing chills. Pt did not feel up to working with PT at this time. Will likely check back on tomorrow morning.    Faye Ramsay, PT Acute Rehabilitation  Office: [phone] Pager: [phone]

## 2020-09-14 ENCOUNTER — Encounter (HOSPITAL_COMMUNITY): Payer: Self-pay | Admitting: Orthopedic Surgery

## 2020-09-14 DIAGNOSIS — Z7901 Long term (current) use of anticoagulants: Secondary | ICD-10-CM | POA: Diagnosis not present

## 2020-09-14 DIAGNOSIS — Z6825 Body mass index (BMI) 25.0-25.9, adult: Secondary | ICD-10-CM | POA: Diagnosis not present

## 2020-09-14 DIAGNOSIS — I10 Essential (primary) hypertension: Secondary | ICD-10-CM | POA: Diagnosis present

## 2020-09-14 DIAGNOSIS — M1612 Unilateral primary osteoarthritis, left hip: Secondary | ICD-10-CM | POA: Diagnosis present

## 2020-09-14 DIAGNOSIS — Z20822 Contact with and (suspected) exposure to covid-19: Secondary | ICD-10-CM | POA: Diagnosis present

## 2020-09-14 DIAGNOSIS — E663 Overweight: Secondary | ICD-10-CM | POA: Diagnosis present

## 2020-09-14 DIAGNOSIS — Z01812 Encounter for preprocedural laboratory examination: Secondary | ICD-10-CM | POA: Diagnosis not present

## 2020-09-14 DIAGNOSIS — I480 Paroxysmal atrial fibrillation: Secondary | ICD-10-CM | POA: Diagnosis present

## 2020-09-14 DIAGNOSIS — Z79899 Other long term (current) drug therapy: Secondary | ICD-10-CM | POA: Diagnosis not present

## 2020-09-14 DIAGNOSIS — Z8673 Personal history of transient ischemic attack (TIA), and cerebral infarction without residual deficits: Secondary | ICD-10-CM | POA: Diagnosis not present

## 2020-09-14 DIAGNOSIS — Z833 Family history of diabetes mellitus: Secondary | ICD-10-CM | POA: Diagnosis not present

## 2020-09-14 DIAGNOSIS — S72002K Fracture of unspecified part of neck of left femur, subsequent encounter for closed fracture with nonunion: Secondary | ICD-10-CM | POA: Diagnosis present

## 2020-09-14 DIAGNOSIS — E785 Hyperlipidemia, unspecified: Secondary | ICD-10-CM | POA: Diagnosis present

## 2020-09-14 DIAGNOSIS — K219 Gastro-esophageal reflux disease without esophagitis: Secondary | ICD-10-CM | POA: Diagnosis present

## 2020-09-14 LAB — CBC
HCT: 30.7 % — ABNORMAL LOW (ref 36.0–46.0)
Hemoglobin: 9.8 g/dL — ABNORMAL LOW (ref 12.0–15.0)
MCH: 26.6 pg (ref 26.0–34.0)
MCHC: 31.9 g/dL (ref 30.0–36.0)
MCV: 83.4 fL (ref 80.0–100.0)
Platelets: 217 10*3/uL (ref 150–400)
RBC: 3.68 MIL/uL — ABNORMAL LOW (ref 3.87–5.11)
RDW: 14.7 % (ref 11.5–15.5)
WBC: 10.7 10*3/uL — ABNORMAL HIGH (ref 4.0–10.5)
nRBC: 0 % (ref 0.0–0.2)

## 2020-09-14 LAB — BASIC METABOLIC PANEL
Anion gap: 9 (ref 5–15)
BUN: 15 mg/dL (ref 8–23)
CO2: 25 mmol/L (ref 22–32)
Calcium: 8.8 mg/dL — ABNORMAL LOW (ref 8.9–10.3)
Chloride: 103 mmol/L (ref 98–111)
Creatinine, Ser: 0.84 mg/dL (ref 0.44–1.00)
GFR, Estimated: >60 mL/min (ref >60)
Glucose, Bld: 133 mg/dL — ABNORMAL HIGH (ref 70–99)
Potassium: 4.7 mmol/L (ref 3.5–5.1)
Sodium: 137 mmol/L (ref 135–145)

## 2020-09-14 LAB — HEMOGLOBIN A1C
Hgb A1c MFr Bld: 5.8 % — ABNORMAL HIGH (ref 4.8–5.6)
Mean Plasma Glucose: 120 mg/dL

## 2020-09-14 NOTE — Evaluation (Signed)
Physical Therapy Evaluation Patient Details Name: [name] MRN: [id number] DOB: 1933/04/15 Today's Date: 09/14/2020   History of Present Illness  [name], 86 y.o. female, has a history of pain and functional disability in the left hip due to trauma and arthritis and patient has failed non-surgical conservative treatments. Pt s/p conversion to posterior THR.  Clinical Impression  Pt presents with dependencies in mobility secondary to the above diagnosis. Pt c/o nausea limiting her tolerance to therapy today. Pt instructed on posterior THP and given a handout for reinforcement. Pt's daughter was present for therapy and also educated on posterior THP and PWB 50%. Pt will continue to benefit from acute skilled PT to maximize mobility and Independence for d/c home with her daughter providing assistance as needed.     Follow Up Recommendations Home health PT;Follow surgeon's recommendation for DC plan and follow-up therapies;Supervision for mobility/OOB    Equipment Recommendations  None recommended by PT    Recommendations for Other Services OT consult     Precautions / Restrictions Precautions Precautions: Fall;Posterior Hip Precaution Comments: reviewed posterior THP with patient and daughter, issued a handout Restrictions Weight Bearing Restrictions: Yes LLE Weight Bearing: Partial weight bearing LLE Partial Weight Bearing Percentage or Pounds: 50      Mobility  Bed Mobility Overal bed mobility: Needs Assistance Bed Mobility: Supine to Sit     Supine to sit: Min assist;HOB elevated     General bed mobility comments: increased time, rest breaks for SOB, assistance moving left leg to edge of bed and intermittent trunk support    Transfers Overall transfer level: Needs assistance Equipment used: Rolling walker (2 wheeled) Transfers: Sit to/from Stand Sit to Stand: Min assist;From elevated surface         General transfer comment: cues for hand  placement  Ambulation/Gait Ambulation/Gait assistance: Min Chemical engineer (Feet): 2 Feet Assistive device: Rolling walker (2 wheeled)   Gait velocity: decreased   General Gait Details: side step to [facility] with RW  Stairs            Wheelchair Mobility    Modified Rankin (Stroke Patients Only)       Balance Overall balance assessment: Needs assistance Sitting-balance support: Bilateral upper extremity supported Sitting balance-Leahy Scale: Good     Standing balance support: Bilateral upper extremity supported Standing balance-Leahy Scale: Fair                               Pertinent Vitals/Pain Pain Assessment: Faces Faces Pain Scale: Hurts even more Pain Location: left hip Pain Descriptors / Indicators: Discomfort;Guarding Pain Intervention(s): Limited activity within patient's tolerance;Repositioned;Monitored during session;Ice applied    Home Living Family/patient expects to be discharged to:: Private residence Living Arrangements: Children Available Help at Discharge: Family Type of Home: House Home Access: Stairs to enter   [employer] of Steps: 1 threshold step Home Layout: Two level;Able to live on main level with bedroom/bathroom        Prior Function Level of Independence: Independent               Hand Dominance        Extremity/Trunk Assessment   Upper Extremity Assessment Upper Extremity Assessment: Defer to OT evaluation    Lower Extremity Assessment Lower Extremity Assessment: LLE deficits/detail LLE Deficits / Details: grossly 2-3/5 LLE: Unable to fully assess due to pain       Communication   Communication: No difficulties  Cognition Arousal/Alertness: Awake/alert Behavior During Therapy: WFL for tasks assessed/performed Overall Cognitive Status: Within Functional Limits for tasks assessed                                        General Comments      Exercises Total  Joint Exercises Ankle Circles/Pumps: AROM;Strengthening;Both;10 reps;Supine Quad Sets: AROM;Strengthening;Left;10 reps;Supine Heel Slides: AROM;Strengthening;Left;10 reps;Supine Hip ABduction/ADduction: AAROM;Strengthening;Left;10 reps;Supine Long Arc Quad: AROM;Strengthening;Left;10 reps;Seated   Assessment/Plan    PT Assessment Patient needs continued PT services  PT Problem List Decreased strength;Decreased balance;Decreased knowledge of precautions;Pain;Decreased range of motion;Decreased mobility;Decreased knowledge of use of DME;Cardiopulmonary status limiting activity;Decreased activity tolerance;Decreased safety awareness       PT Treatment Interventions DME instruction;Functional mobility training;Balance training;Patient/family education;Gait training;Therapeutic activities;Stair training;Therapeutic exercise    PT Goals (Current goals can be found in the Care Plan section)  Acute Rehab PT Goals Patient Stated Goal: to walk and go to daughter's house PT Goal Formulation: With patient Time For Goal Achievement: 09/22/20 Potential to Achieve Goals: Good    Frequency 7X/week   Barriers to discharge        Co-evaluation               AM-PAC PT "6 Clicks" Mobility  Outcome Measure Help needed turning from your back to your side while in a flat bed without using bedrails?: A Little Help needed moving from lying on your back to sitting on the side of a flat bed without using bedrails?: A Little Help needed moving to and from a bed to a chair (including a wheelchair)?: A Little Help needed standing up from a chair using your arms (e.g., wheelchair or bedside chair)?: A Little Help needed to walk in hospital room?: A Little Help needed climbing 3-5 steps with a railing? : A Lot 6 Click Score: 17    End of Session Equipment Utilized During Treatment: Gait belt Activity Tolerance: Patient tolerated treatment well Patient left: in bed;with call bell/phone within  reach;with bed alarm set;with family/visitor present Nurse Communication: Mobility status PT Visit Diagnosis: Pain;Muscle weakness (generalized) (M62.81);Other abnormalities of gait and mobility (R26.89);History of falling (Z91.81) Pain - Right/Left: Left Pain - part of body: Hip    Time: 3846-6599 PT Time Calculation (min) (ACUTE ONLY): 40 min   Charges:   PT Evaluation $PT Eval Moderate Complexity: 1 Mod PT Treatments $Gait Training: 8-22 mins $Therapeutic Exercise: 8-22 mins        Greggory Stallion 09/14/2020, 10:56 AM

## 2020-09-14 NOTE — Plan of Care (Signed)
  Problem: Clinical Measurements: Goal: Ability to maintain clinical measurements within normal limits will improve Outcome: Progressing Goal: Will remain free from infection Outcome: Progressing Goal: Diagnostic test results will improve Outcome: Progressing Goal: Respiratory complications will improve Outcome: Progressing Goal: Cardiovascular complication will be avoided Outcome: Progressing   Problem: Activity: Goal: Risk for activity intolerance will decrease Outcome: Progressing   Problem: Nutrition: Goal: Adequate nutrition will be maintained Outcome: Progressing   Problem: Coping: Goal: Level of anxiety will decrease Outcome: Progressing   Problem: Elimination: Goal: Will not experience complications related to bowel motility Outcome: Progressing Goal: Will not experience complications related to urinary retention Outcome: Progressing   Problem: Pain Managment: Goal: General experience of comfort will improve Outcome: Progressing   Problem: Safety: Goal: Ability to remain free from injury will improve Outcome: Progressing   Problem: Skin Integrity: Goal: Risk for impaired skin integrity will decrease Outcome: Progressing   Problem: Health Behavior/Discharge Planning: Goal: Ability to manage health-related needs will improve Outcome: Progressing   Problem: Clinical Measurements: Goal: Ability to maintain clinical measurements within normal limits will improve Outcome: Progressing Goal: Will remain free from infection Outcome: Progressing Goal: Diagnostic test results will improve Outcome: Progressing   Problem: Activity: Goal: Risk for activity intolerance will decrease Outcome: Progressing   Problem: Nutrition: Goal: Adequate nutrition will be maintained Outcome: Progressing   Problem: Elimination: Goal: Will not experience complications related to bowel motility Outcome: Progressing Goal: Will not experience complications related to urinary  retention Outcome: Progressing   Problem: Pain Managment: Goal: General experience of comfort will improve Outcome: Progressing   Problem: Safety: Goal: Ability to remain free from injury will improve Outcome: Progressing   Problem: Skin Integrity: Goal: Risk for impaired skin integrity will decrease Outcome: Progressing   Problem: Education: Goal: Knowledge of the prescribed therapeutic regimen will improve Outcome: Progressing Goal: Understanding of discharge needs will improve Outcome: Progressing   Problem: Activity: Goal: Ability to avoid complications of mobility impairment will improve Outcome: Progressing   Problem: Activity: Goal: Ability to tolerate increased activity will improve Outcome: Progressing   Problem: Clinical Measurements: Goal: Postoperative complications will be avoided or minimized Outcome: Progressing   Problem: Pain Management: Goal: Pain level will decrease with appropriate interventions Outcome: Progressing   Problem: Skin Integrity: Goal: Will show signs of wound healing Outcome: Progressing   Problem: Education: Goal: Knowledge of the prescribed therapeutic regimen will improve Outcome: Progressing Goal: Understanding of discharge needs will improve Outcome: Progressing   Problem: Activity: Goal: Ability to avoid complications of mobility impairment will improve Outcome: Progressing Goal: Ability to tolerate increased activity will improve Outcome: Progressing   Problem: Clinical Measurements: Goal: Postoperative complications will be avoided or minimized Outcome: Progressing   Problem: Pain Management: Goal: Pain level will decrease with appropriate interventions Outcome: Progressing   Problem: Skin Integrity: Goal: Will show signs of wound healing Outcome: Progressing

## 2020-09-14 NOTE — Progress Notes (Signed)
     Subjective: 1 Day Post-Op Procedure(s) (LRB): CONVERSION TO LEFT TOTAL HIP-POSTERIOR APPROACH (Left)   Seen by Dr. Charlann Boxer. Patient reports pain as mild, pain controlled. No reported events throughout the night.  Dr. Charlann Boxer discussed the procedure, findings and expectations moving forward.  Plan on discharge home tomorrow, depending on PT.     Objective:   VITALS:   Vitals:   09/13/20 2127 09/14/20 0542  BP: 131/63 (!) 118/54  Pulse: 83 68  Resp: 17 16  Temp: 98 F (36.7 C) 97.6 F (36.4 C)  SpO2: 97% 98%    Dorsiflexion/Plantar flexion intact Incision: dressing C/D/I No cellulitis present Compartment soft  LABS Recent Labs    09/14/20 0304  HGB 9.8*  HCT 30.7*  WBC 10.7*  PLT 217    Recent Labs    09/14/20 0304  NA 137  K 4.7  BUN 15  CREATININE 0.84  GLUCOSE 133*     Assessment/Plan: 1 Day Post-Op Procedure(s) (LRB): CONVERSION TO LEFT TOTAL HIP-POSTERIOR APPROACH (Left) Foley cath d/c'ed Advance diet Up with therapy D/C IV fluids Discharge home eventually, when ready  Overweight (BMI 25-29.9) Estimated body mass index is 27.57 kg/m as calculated from the following:   Height as of this encounter: 5\' 7"  (1.702 m).   Weight as of this encounter: 79.8 kg. Patient also counseled that weight may inhibit the healing process Patient counseled that losing weight will help with future health issues       PA-C  [facility]  [place] [address], [address], [place], [place] Phone: [phone] [email or web address] Facebook  [phone]

## 2020-09-14 NOTE — Progress Notes (Signed)
Physical Therapy Treatment Patient Details Name: [name] MRN: [id number] DOB: 07-14-1933 Today's Date: 09/14/2020    History of Present Illness [name], 86 y.o. female, has a history of pain and functional disability in the left hip due to trauma and arthritis and patient has failed non-surgical conservative treatments. Pt s/p conversion to posterior THR.    PT Comments    Pt is limited by c/o nausea and dizziness. Pt had SOB with exertion and some mild wheezing noted. O2 sats in the low 90s on RA. Pt completed HEP with supervision and verbal cues. Pt was able to ambulate a few feet with RW min assist, but was limited by nausea. Pt had difficulty following some commands for mobility possibly due to medication. Pt was given a muscle relaxer and pain meds prior to session. Pt will continue to benefit from skilled PT to maximize mobility and Independence for d/c home with family providing assistance with mobility. Anticipate pt may need another 1-2 days prior to d/c secondary to nausea, SOB, and decreased ability to tolerate therapy.    Follow Up Recommendations  Home health PT;Follow surgeon's recommendation for DC plan and follow-up therapies;Supervision for mobility/OOB     Equipment Recommendations  None recommended by PT    Recommendations for Other Services OT consult     Precautions / Restrictions Precautions Precautions: Fall;Posterior Hip Precaution Comments: reviewed posterior THP with patient and daughter, issued a handout Restrictions Weight Bearing Restrictions: Yes LLE Weight Bearing: Partial weight bearing LLE Partial Weight Bearing Percentage or Pounds: 50%    Mobility  Bed Mobility Overal bed mobility: Needs Assistance Bed Mobility: Supine to Sit;Sit to Supine     Supine to sit: Min assist;HOB elevated Sit to supine: Min assist;HOB elevated   General bed mobility comments: increased time, rest breaks for SOB, assistance moving left leg to edge of  bed and intermittent trunk support  Transfers Overall transfer level: Needs assistance Equipment used: Rolling walker (2 wheeled) Transfers: Sit to/from Stand Sit to Stand: Min assist;From elevated surface         General transfer comment: cues for hand placement  Ambulation/Gait Ambulation/Gait assistance: Min assist Gait Distance (Feet): 4 Feet Assistive device: Rolling walker (2 wheeled) Gait Pattern/deviations: Step-to pattern;Decreased stride length Gait velocity: decreased   General Gait Details: took a few steps forward, back and also side stepped. Pt continues to c/o nausea and "swimmy head" so limited distance for safety.   Stairs             Wheelchair Mobility    Modified Rankin (Stroke Patients Only)       Balance Overall balance assessment: Needs assistance Sitting-balance support: Bilateral upper extremity supported Sitting balance-Leahy Scale: Good     Standing balance support: Bilateral upper extremity supported Standing balance-Leahy Scale: Fair                              Cognition Arousal/Alertness: Awake/alert Behavior During Therapy: WFL for tasks assessed/performed Overall Cognitive Status: Within Functional Limits for tasks assessed                                 General Comments: Pt presents with SOB with exertion, rest breaks needed and monitored O2 sats, pt limited by nausea and c/o "swimmy head" BP 146/84      Exercises Total Joint Exercises Ankle Circles/Pumps: AROM;Strengthening;Both;10 reps;Supine Quad Sets: AROM;Strengthening;Left;10  reps;Supine Heel Slides: AROM;Strengthening;Left;10 reps;Supine Hip ABduction/ADduction: AAROM;Strengthening;Left;10 reps;Supine Long Arc Quad: AROM;Strengthening;Left;10 reps;Seated    General Comments General comments (skin integrity, edema, etc.): Pt's daughter was present and able to help as needed      Pertinent Vitals/Pain Pain Assessment: Faces Faces  Pain Scale: Hurts little more Pain Location: left hip Pain Descriptors / Indicators: Discomfort;Guarding Pain Intervention(s): Limited activity within patient's tolerance;Repositioned;Monitored during session;Ice applied;Premedicated before session    Home Living Family/patient expects to be discharged to:: Private residence Living Arrangements: Children Available Help at Discharge: Family Type of Home: House Home Access: Stairs to enter   Home Layout: Two level;Able to live on main level with bedroom/bathroom        Prior Function Level of Independence: Independent          PT Goals (current goals can now be found in the care plan section) Acute Rehab PT Goals Patient Stated Goal: to walk and go to daughter's house PT Goal Formulation: With patient Time For Goal Achievement: 09/22/20 Potential to Achieve Goals: Good Progress towards PT goals: Progressing toward goals    Frequency    7X/week      PT Plan Current plan remains appropriate    Co-evaluation              AM-PAC PT "6 Clicks" Mobility   Outcome Measure  Help needed turning from your back to your side while in a flat bed without using bedrails?: A Little Help needed moving from lying on your back to sitting on the side of a flat bed without using bedrails?: A Little Help needed moving to and from a bed to a chair (including a wheelchair)?: A Little Help needed standing up from a chair using your arms (e.g., wheelchair or bedside chair)?: A Little Help needed to walk in hospital room?: A Little Help needed climbing 3-5 steps with a railing? : A Lot 6 Click Score: 17    End of Session Equipment Utilized During Treatment: Gait belt Activity Tolerance: Other (comment) (nausea, dizziness) Patient left: in bed;with call bell/phone within reach;with bed alarm set;with family/visitor present Nurse Communication: Mobility status PT Visit Diagnosis: Pain;Muscle weakness (generalized) (M62.81);Other  abnormalities of gait and mobility (R26.89);History of falling (Z91.81) Pain - Right/Left: Left Pain - part of body: Hip     Time: 1275-1700 PT Time Calculation (min) (ACUTE ONLY): 35 min  Charges:  $Gait Training: 8-22 mins $Therapeutic Exercise: 8-22 mins                      Alistair Senft Kerstine 09/14/2020, 1:51 PM

## 2020-09-15 LAB — CBC
HCT: 28.6 % — ABNORMAL LOW (ref 36.0–46.0)
Hemoglobin: 9.2 g/dL — ABNORMAL LOW (ref 12.0–15.0)
MCH: 27.3 pg (ref 26.0–34.0)
MCHC: 32.2 g/dL (ref 30.0–36.0)
MCV: 84.9 fL (ref 80.0–100.0)
Platelets: 197 10*3/uL (ref 150–400)
RBC: 3.37 MIL/uL — ABNORMAL LOW (ref 3.87–5.11)
RDW: 14.9 % (ref 11.5–15.5)
WBC: 11.6 10*3/uL — ABNORMAL HIGH (ref 4.0–10.5)
nRBC: 0 % (ref 0.0–0.2)

## 2020-09-15 LAB — BASIC METABOLIC PANEL
Anion gap: 7 (ref 5–15)
BUN: 15 mg/dL (ref 8–23)
CO2: 26 mmol/L (ref 22–32)
Calcium: 8.8 mg/dL — ABNORMAL LOW (ref 8.9–10.3)
Chloride: 103 mmol/L (ref 98–111)
Creatinine, Ser: 0.69 mg/dL (ref 0.44–1.00)
GFR, Estimated: >60 mL/min (ref >60)
Glucose, Bld: 153 mg/dL — ABNORMAL HIGH (ref 70–99)
Potassium: 4 mmol/L (ref 3.5–5.1)
Sodium: 136 mmol/L (ref 135–145)

## 2020-09-15 NOTE — Progress Notes (Addendum)
   Subjective: 2 Days Post-Op Procedure(s) (LRB): CONVERSION TO LEFT TOTAL HIP-POSTERIOR APPROACH (Left) Patient reports pain as mild.   Patient seen in rounds by Dr. Charlann Boxer. Patient is well, and has had no acute complaints or problems other than discomfort in the left hip. No acute events overnight. Per notes, she was limited in PT yesterday secondary to nausea.  We will continue therapy today.   Objective: Vital signs in last 24 hours: Temp:  [97.8 F (36.6 C)-98.2 F (36.8 C)] 97.8 F (36.6 C) (10/28 0602) Pulse Rate:  [68-87] 74 (10/28 0602) Resp:  [16-18] 17 (10/28 0602) BP: (124-148)/(59-68) 129/60 (10/28 0602) SpO2:  [92 %-99 %] 99 % (10/28 0602)  Intake/Output from previous day:  Intake/Output Summary (Last 24 hours) at 09/15/2020 0905 Last data filed at 09/15/2020 0849 Gross per 24 hour  Intake 444.17 ml  Output 1150 ml  Net -705.83 ml     Intake/Output this shift: Total I/O In: 150 [P.O.:150] Out: -   Labs: Recent Labs    09/14/20 0304 09/15/20 0303  HGB 9.8* 9.2*   Recent Labs    09/14/20 0304 09/15/20 0303  WBC 10.7* 11.6*  RBC 3.68* 3.37*  HCT 30.7* 28.6*  PLT 217 197   Recent Labs    09/14/20 0304 09/15/20 0303  NA 137 136  K 4.7 4.0  CL 103 103  CO2 25 26  BUN 15 15  CREATININE 0.84 0.69  GLUCOSE 133* 153*  CALCIUM 8.8* 8.8*   No results for input(s): LABPT, INR in the last 72 hours.  Exam: General - Patient is Alert and Oriented Extremity - Neurologically intact Sensation intact distally Intact pulses distally Dorsiflexion/Plantar flexion intact Dressing - dressing C/D/I Motor Function - intact, moving foot and toes well on exam.   Past Medical History:  Diagnosis Date  . Arthritis   . Atrial fibrillation, transient (HCC)    post op hip surgery; May 2021  . Diverticulitis   . Dyspnea    with exertion   . GERD (gastroesophageal reflux disease)   . Hyperlipidemia   . Hypertension   . Stroke (HCC)    hx opf mini strokes    . TIA (transient ischemic attack)     Assessment/Plan: 2 Days Post-Op Procedure(s) (LRB): CONVERSION TO LEFT TOTAL HIP-POSTERIOR APPROACH (Left) Active Problems:   S/P left THA, posterior   S/P revision of total hip   Overweight (BMI 25.0-29.9)  Estimated body mass index is 27.57 kg/m as calculated from the following:   Height as of this encounter: 5\' 7"  (1.702 m).   Weight as of this encounter: 79.8 kg. Advance diet Up with therapy  DVT Prophylaxis - Eliquis PWB LLE 50% Hip precautions discussed with patient  Hemoglobin stable at 9.2 this AM.  Plan is to go Home after hospital stay. Plan for possible discharge home today following 1-2 sessions of PT if she is meeting her goals and safe for discharge home. Otherwise, continue working with therapy on mobility and PWB restrictions. Follow up in the office in 2 weeks.   , PA-C Orthopedic Surgery [phone] 09/15/2020, 9:05 AM

## 2020-09-15 NOTE — Progress Notes (Signed)
Physical Therapy Treatment Patient Details Name: [name] MRN: [id number] DOB: 09/30/1933 Today's Date: 09/15/2020    History of Present Illness [name], 86 y.o. female, has a history of pain and functional disability in the left hip due to trauma and arthritis and patient has failed non-surgical conservative treatments. Pt s/p conversion to posterior THR.    PT Comments    Pt unable to verbalize posterior hip precautions, only states "I can't walk on this leg". Reeducated pt on posterior hip precautions and provided cues throughout treatment to maintain them with mobility. Pt transfers from EOB to [facility] then able to take multiple small steps over to bedside chair with min A to steady and assist with RW management. Once up in chair, pt engages in therapeutic exercises with encouragement. Pt's daughter in room, discussed session, current assist level, and d/c plans based on current functional status. Patient will benefit from continued physical therapy in hospital and recommendations below to increase strength, balance, endurance for safe ADLs and gait.    Follow Up Recommendations  Home health PT;Follow surgeon's recommendation for DC plan and follow-up therapies;Supervision for mobility/OOB     Equipment Recommendations  None recommended by PT    Recommendations for Other Services OT consult     Precautions / Restrictions Precautions Precautions: Fall;Posterior Hip Precaution Comments: reviewed posterior hip precautions with pt and daughter Restrictions Weight Bearing Restrictions: Yes LLE Weight Bearing: Partial weight bearing LLE Partial Weight Bearing Percentage or Pounds: 50    Mobility  Bed Mobility Overal bed mobility: Needs Assistance Bed Mobility: Supine to Sit  Supine to sit: Min assist;HOB elevated  General bed mobility comments: min A to mobilize LLE to EOB while maintaining hip precautions, cues for sequencing and use of bedrail, SOB with mobility  requiring seated rest once sitting  Transfers Overall transfer level: Needs assistance Equipment used: Rolling walker (2 wheeled) Transfers: Sit to/from [employer] Sit to Stand: Min assist;From elevated surface Stand pivot transfers: Min assist    General transfer comment: min A to power up from elevated bed, educated on respecting hip precautions with rise and return to sit, min A to steady with pivoting and cues for sequencing  Ambulation/Gait Ambulation/Gait assistance: Min assist Gait Distance (Feet): 4 Feet Assistive device: Rolling walker (2 wheeled) Gait Pattern/deviations: Step-to pattern;Decreased stride length;Decreased weight shift to left Gait velocity: decreased   General Gait Details: limited to a few short, shuffling steps between bed/BSC/chair, cues for sequencing to respect posterior hip precautions, cues for safety to decrease impulsive mobility, min A to maneuver RW safely   Stairs             Wheelchair Mobility    Modified Rankin (Stroke Patients Only)       Balance Overall balance assessment: Needs assistance Sitting-balance support: Bilateral upper extremity supported Sitting balance-Leahy Scale: Good  Standing balance support: Bilateral upper extremity supported;During functional activity Standing balance-Leahy Scale: Poor Standing balance comment: reliant on UE support         Cognition Arousal/Alertness: Awake/alert Behavior During Therapy: WFL for tasks assessed/performed Overall Cognitive Status: Within Functional Limits for tasks assessed  General Comments: Pt slightly slow to respond to cues and occasionaly requires repeat cues, unsure if cognitive or hearing?      Exercises Total Joint Exercises Ankle Circles/Pumps: AROM;Strengthening;Both;Seated;10 reps Quad Sets: AROM;Strengthening;Left;Seated;10 reps Heel Slides: AROM;Strengthening;Left;Seated;10 reps Hip ABduction/ADduction: AROM;Strengthening;Left;Seated;10  reps    General Comments General comments (skin integrity, edema, etc.): Pt on 2L O2 upon arrival with SpO2  98%, placed on RA with mobility and SpO2 92-95%, remained on RA at EOS.      Pertinent Vitals/Pain Pain Assessment: Faces Faces Pain Scale: Hurts little more Pain Location: L hip with mobility Pain Descriptors / Indicators: Grimacing;Guarding Pain Intervention(s): Limited activity within patient's tolerance;Monitored during session;Premedicated before session;Repositioned;Ice applied    Home Living                      Prior Function            PT Goals (current goals can now be found in the care plan section) Acute Rehab PT Goals Patient Stated Goal: to walk and go to daughter's house PT Goal Formulation: With patient Time For Goal Achievement: 09/22/20 Potential to Achieve Goals: Good Progress towards PT goals: Progressing toward goals    Frequency    7X/week      PT Plan Current plan remains appropriate    Co-evaluation              AM-PAC PT "6 Clicks" Mobility   Outcome Measure  Help needed turning from your back to your side while in a flat bed without using bedrails?: A Little Help needed moving from lying on your back to sitting on the side of a flat bed without using bedrails?: A Little Help needed moving to and from a bed to a chair (including a wheelchair)?: A Little Help needed standing up from a chair using your arms (e.g., wheelchair or bedside chair)?: A Little Help needed to walk in hospital room?: A Little Help needed climbing 3-5 steps with a railing? : Total 6 Click Score: 16    End of Session Equipment Utilized During Treatment: Gait belt Activity Tolerance: Patient tolerated treatment well;Patient limited by fatigue Patient left: in chair;with call bell/phone within reach;with chair alarm set;with family/visitor present Nurse Communication: Mobility status PT Visit Diagnosis: Pain;Muscle weakness (generalized)  (M62.81);Other abnormalities of gait and mobility (R26.89);History of falling (Z91.81) Pain - Right/Left: Left Pain - part of body: Hip     Time: 7412-8786 PT Time Calculation (min) (ACUTE ONLY): 30 min  Charges:  $Therapeutic Exercise: 8-22 mins $Therapeutic Activity: 8-22 mins                      Tori Teegan Brandis PT, DPT 09/15/20, 1:12 PM

## 2020-09-15 NOTE — NC FL2 (Signed)
[place] MEDICAID FL2 LEVEL OF CARE SCREENING TOOL     IDENTIFICATION  Patient Name: [name] Birthdate: 05/29/1933 Sex: female Admission Date (Current Location): 09/13/2020  [facility] and [place] Number:  Producer, television/film/video and Address:  [facility],  [address], [place] [address]      Provider Number: [id number]  Attending Physician Name and Address:  Durene Romans, MD  Relative Name and Phone Number:  daughter, Dennie Bible [email or web address]@ [phone]    Current Level of Care: Hospital Recommended Level of Care: Skilled Nursing Facility Prior Approval Number:    Date Approved/Denied:   PASRR Number: [id number]  Discharge Plan: SNF    Current Diagnoses: Patient Active Problem List   Diagnosis Date Noted  . Overweight (BMI 25.0-29.9) 09/14/2020  . S/P left THA, 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  . 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 Paroxysmal atrial fibrillation (HCC) 07/21/2020    Orientation RESPIRATION BLADDER Height & Weight     Self, Time, Situation, Place  Normal Continent Weight: 176 lb (79.8 kg) Height:  5\' 7"  (170.2 cm)  BEHAVIORAL SYMPTOMS/MOOD NEUROLOGICAL BOWEL NUTRITION STATUS      Continent    AMBULATORY STATUS COMMUNICATION OF NEEDS Skin   Extensive Assist   Surgical wounds                       Personal Care Assistance Level of Assistance  Bathing, Dressing Bathing Assistance: Limited assistance   Dressing Assistance: Limited assistance     Functional Limitations Info             SPECIAL CARE FACTORS FREQUENCY  PT (By licensed PT), OT (By licensed OT)     PT Frequency: 5x/wk OT Frequency: 5x/wk            Contractures Contractures Info: Not present    Additional Factors Info  Code Status, Allergies Code Status Info: Full Allergies Info: see MAR           Current Medications (09/15/2020):  This is the current hospital active medication list Current Facility-Administered Medications  Medication Dose Route  Frequency Provider Last Rate Last Admin  . 0.9 %  sodium chloride infusion   Intravenous Continuous 09/17/2020, PA-C 100 mL/hr at 09/14/20 0600 Rate Verify at 09/14/20 0600  . acetaminophen (TYLENOL) tablet 1,000 mg  1,000 mg Oral Q8H Babish, Matthew, PA-C   1,000 mg at 09/15/20 1411  . alum & mag hydroxide-simeth (MAALOX/MYLANTA) 200-200-20 MG/5ML suspension 15 mL  15 mL Oral Q4H PRN 05-28-2001, PA-C   15 mL at 09/15/20 0858  . amLODipine (NORVASC) tablet 10 mg  10 mg Oral QPM 09/17/20, PA-C   10 mg at 09/14/20 1715  . apixaban (ELIQUIS) tablet 2.5 mg  2.5 mg Oral Q12H 09/16/20, PA-C   2.5 mg at 09/15/20 09/17/20  . bisacodyl (DULCOLAX) suppository 10 mg  10 mg Rectal Daily PRN Babish, Matthew, PA-C      . diphenhydrAMINE (BENADRYL) 12.5 MG/5ML elixir 12.5-25 mg  12.5-25 mg Oral Q4H PRN Babish, Matthew, PA-C      . docusate sodium (COLACE) capsule 100 mg  100 mg Oral BID Babish, Matthew, PA-C   100 mg at 09/15/20 09/17/20  . ferrous sulfate tablet 325 mg  325 mg Oral TID PC 4401, PA-C   325 mg at 09/15/20 1411  . gabapentin (NEURONTIN) capsule 300 mg  300 mg Oral TID 09/17/20, PA-C   300 mg at 09/15/20 0851  . HYDROmorphone (  DILAUDID) injection 0.5-1 mg  0.5-1 mg Intravenous Q2H PRN Lanney Gins, PA-C   1 mg at 09/13/20 2216  . labetalol (NORMODYNE) tablet 200 mg  200 mg Oral BID Lanney Gins, PA-C   200 mg at 09/15/20 0854  . loratadine (CLARITIN) tablet 10 mg  10 mg Oral Daily Lanney Gins, PA-C   10 mg at 09/15/20 6659  . magnesium citrate solution 1 Bottle  1 Bottle Oral Once PRN Babish, Matthew, PA-C      . menthol-cetylpyridinium (CEPACOL) lozenge 3 mg  1 lozenge Oral PRN Lanney Gins, PA-C       Or  . phenol (CHLORASEPTIC) mouth spray 1 spray  1 spray Mouth/Throat PRN Lanney Gins, PA-C      . methocarbamol (ROBAXIN) tablet 500 mg  500 mg Oral Q6H PRN Lanney Gins, PA-C   500 mg at 09/15/20 9357   Or  . methocarbamol (ROBAXIN) 500 mg in  dextrose 5 % 50 mL IVPB  500 mg Intravenous Q6H PRN Lanney Gins, PA-C   Stopped at 09/13/20 1207  . metoCLOPramide (REGLAN) tablet 5-10 mg  5-10 mg Oral Q8H PRN Lanney Gins, PA-C       Or  . metoCLOPramide (REGLAN) injection 5-10 mg  5-10 mg Intravenous Q8H PRN Lanney Gins, PA-C   10 mg at 09/13/20 1300  . ondansetron (ZOFRAN) tablet 4 mg  4 mg Oral Q6H PRN Lanney Gins, PA-C   4 mg at 09/15/20 0177   Or  . ondansetron (ZOFRAN) injection 4 mg  4 mg Intravenous Q6H PRN Lanney Gins, PA-C      . oxyCODONE (Oxy IR/ROXICODONE) immediate release tablet 10 mg  10 mg Oral Q4H PRN Lanney Gins, PA-C   10 mg at 09/15/20 0617  . oxyCODONE (Oxy IR/ROXICODONE) immediate release tablet 5 mg  5 mg Oral Q4H PRN Lanney Gins, PA-C   5 mg at 09/13/20 1711  . polyethylene glycol (MIRALAX / GLYCOLAX) packet 17 g  17 g Oral BID Lanney Gins, PA-C   17 g at 09/14/20 2107  . pravastatin (PRAVACHOL) tablet 20 mg  20 mg Oral QHS Lanney Gins, PA-C   20 mg at 09/14/20 2101  . promethazine (PHENERGAN) injection 12.5 mg  12.5 mg Intravenous Q6H PRN Durene Romans, MD   12.5 mg at 09/13/20 1406  . spironolactone (ALDACTONE) tablet 25 mg  25 mg Oral Daily Lanney Gins, PA-C   25 mg at 09/15/20 9390  . sucralfate (CARAFATE) tablet 1 g  1 g Oral TID WC & HS Lanney Gins, PA-C   1 g at 09/15/20 0617  . temazepam (RESTORIL) capsule 30 mg  30 mg Oral QHS Babish, Matthew, PA-C   30 mg at 09/14/20 2100     Discharge Medications: Please see discharge summary for a list of discharge medications.  Relevant Imaging Results:  Relevant Lab Results:   Additional Information SS# [id number]  Amada Jupiter, LCSW

## 2020-09-15 NOTE — Progress Notes (Signed)
Physical Therapy Treatment Patient Details Name: [name] MRN: [id number] DOB: [birth year] Today's Date: 09/15/2020    History of Present Illness [name], 86 y.o. female, has a history of pain and functional disability in the left hip due to trauma and arthritis and patient has failed non-surgical conservative treatments. Pt s/p conversion to posterior THR.    PT Comments    Pt recently transferred back to bed, but agreeable to therapy. Pt requests to transfer to [facility], able to stand while therapist assists with pericare. Pt cued for PWB on LLE with steps at bedside, naturally wanting to hold LLE off of floor and hop on RLE. Pt tolerates therapeutic exercise, reviewed written sheet and verbalizes understanding. Pt requires cues for posterior hip precautions with bed mobility and transfers. Pt would continue to benefit from acute PT and updated to SNF at discharge due to assistance level required and slow progress with ambulation tolerance.   Follow Up Recommendations  Follow surgeon's recommendation for DC plan and follow-up therapies;SNF     Equipment Recommendations  None recommended by PT    Recommendations for Other Services OT consult     Precautions / Restrictions Precautions Precautions: Fall;Posterior Hip Precaution Comments: reviewed posterior hip precautions with pt and daughter Restrictions Weight Bearing Restrictions: Yes LLE Weight Bearing: Partial weight bearing LLE Partial Weight Bearing Percentage or Pounds: 50    Mobility  Bed Mobility Overal bed mobility: Needs Assistance Bed Mobility: Supine to Sit;Sit to Supine  Supine to sit: Min assist;HOB elevated Sit to supine: Min assistGeneral bed mobility comments: min A to upright trunk and mobilize LLE to EOB, cues for hip precautions, min A to lift BLE Back into bed  Transfers Overall transfer level: Needs assistance Equipment used: Rolling walker (2 wheeled) Transfers: Sit to/from [employer] Sit to Stand: Min assist;From elevated surface Stand pivot transfers: Min assist  General transfer comment: min A to power up from EOB with cues for posterior hip precautions, min A to steady and maneuver RW  Ambulation/Gait Ambulation/Gait assistance: Min Chemical engineer (Feet): 4 Feet Assistive device: Rolling walker (2 wheeled) Gait Pattern/deviations: Step-to pattern;Decreased step length - left Gait velocity: decreased   General Gait Details: limited to steps at bedside laterally and fwd/backward, initiall LLE lifted in air and cued for PWB with good carryover, no overt LOB, generally unsteady with steps   Stairs             Wheelchair Mobility    Modified Rankin (Stroke Patients Only)       Balance Overall balance assessment: Needs assistance Sitting-balance support: Bilateral upper extremity supported Sitting balance-Leahy Scale: Good     Standing balance support: Bilateral upper extremity supported;During functional activity Standing balance-Leahy Scale: Poor Standing balance comment: reliant on UE support           Cognition Arousal/Alertness: Awake/alert Behavior During Therapy: WFL for tasks assessed/performed Overall Cognitive Status: Within Functional Limits for tasks assessed       Exercises Total Joint Exercises Ankle Circles/Pumps: AROM;Strengthening;Both;Supine;10 reps Quad Sets: AROM;Strengthening;Both;Supine;10 reps Heel Slides: AROM;Strengthening;Left;Supine;10 reps (gait belt assisting) Hip ABduction/ADduction: AROM;Strengthening;Left;Supine;5 reps    General Comments      Pertinent Vitals/Pain Pain Assessment: Faces Faces Pain Scale: Hurts a little bit Pain Location: L hip with mobility Pain Descriptors / Indicators: Grimacing;Guarding Pain Intervention(s): Limited activity within patient's tolerance;Monitored during session;Repositioned;Ice applied    Home Living  Prior Function             PT Goals (current goals can now be found in the care plan section) Acute Rehab PT Goals Patient Stated Goal: to walk and go to daughter's house PT Goal Formulation: With patient Time For Goal Achievement: 09/22/20 Potential to Achieve Goals: Good Progress towards PT goals: Progressing toward goals    Frequency    7X/week      PT Plan Discharge plan needs to be updated    Co-evaluation              AM-PAC PT "6 Clicks" Mobility   Outcome Measure  Help needed turning from your back to your side while in a flat bed without using bedrails?: A Little Help needed moving from lying on your back to sitting on the side of a flat bed without using bedrails?: A Little Help needed moving to and from a bed to a chair (including a wheelchair)?: A Little Help needed standing up from a chair using your arms (e.g., wheelchair or bedside chair)?: A Little Help needed to walk in hospital room?: A Little Help needed climbing 3-5 steps with a railing? : Total 6 Click Score: 16    End of Session Equipment Utilized During Treatment: Gait belt Activity Tolerance: Patient tolerated treatment well Patient left: in bed;with call bell/phone within reach;with family/visitor present Nurse Communication: Mobility status PT Visit Diagnosis: Pain;Muscle weakness (generalized) (M62.81);Other abnormalities of gait and mobility (R26.89);History of falling (Z91.81) Pain - Right/Left: Left Pain - part of body: Hip     Time: 1435-1510 PT Time Calculation (min) (ACUTE ONLY): 35 min  Charges:  $Therapeutic Exercise: 8-22 mins $Therapeutic Activity: 8-22 mins                      Tori Nekeisha Aure PT, DPT 09/15/20, 3:24 PM

## 2020-09-15 NOTE — Plan of Care (Signed)
  Problem: Activity: Goal: Risk for activity intolerance will decrease Outcome: Progressing   Problem: Pain Managment: Goal: General experience of comfort will improve Outcome: Progressing   Problem: Activity: Goal: Risk for activity intolerance will decrease Outcome: Progressing   Problem: Pain Managment: Goal: General experience of comfort will improve Outcome: Progressing   Problem: Pain Management: Goal: Pain level will decrease with appropriate interventions Outcome: Progressing

## 2020-09-16 LAB — SARS CORONAVIRUS 2 BY RT PCR (HOSPITAL ORDER, PERFORMED IN ~~LOC~~ HOSPITAL LAB): SARS Coronavirus 2: NEGATIVE

## 2020-09-16 MED ORDER — FERROUS SULFATE 325 (65 FE) MG PO TABS: 325.0000 mg | ORAL_TABLET | Freq: Three times a day (TID) | ORAL | 0 refills | Status: DC

## 2020-09-16 MED ORDER — DOCUSATE SODIUM 100 MG PO CAPS: 100.0000 mg | ORAL_CAPSULE | Freq: Two times a day (BID) | ORAL | 0 refills | Status: DC

## 2020-09-16 MED ORDER — METHOCARBAMOL 500 MG PO TABS: 500.0000 mg | ORAL_TABLET | Freq: Four times a day (QID) | ORAL | 0 refills | Status: DC | PRN

## 2020-09-16 MED ORDER — ACETAMINOPHEN 500 MG PO TABS: 1000.0000 mg | ORAL_TABLET | Freq: Three times a day (TID) | ORAL | 0 refills | Status: DC

## 2020-09-16 MED ORDER — OXYCODONE HCL 5 MG PO TABS
5.0000 mg | ORAL_TABLET | Freq: Four times a day (QID) | ORAL | 0 refills | Status: DC | PRN
Start: 2020-09-16 — End: 2020-10-28

## 2020-09-16 MED ORDER — POLYETHYLENE GLYCOL 3350 17 G PO PACK: 17.0000 g | PACK | Freq: Two times a day (BID) | ORAL | 0 refills | Status: DC

## 2020-09-16 NOTE — Progress Notes (Signed)
Physical Therapy Treatment Patient Details Name: [name] MRN: [id number] DOB: [birth year] Today's Date: 09/16/2020    History of Present Illness [name], 86 y.o. female, has a history of pain and functional disability in the left hip due to trauma and arthritis and patient has failed non-surgical conservative treatments. Pt s/p conversion to posterior THR.    PT Comments    Pt with significant improvement in ambulation tolerance, able to ambulate 10 ft with min guard assist and RW 2 times with seated rest break between. Pt demonstrates appropriate WB status with ambulation, but continued difficulty stating posterior hip precautions. Pt slightly nauseas with mobility but improves with rest breaks. Pt tolerates remaining up in chair at EOS with daughter in room and all needs in reach. RN notified of session.   Follow Up Recommendations  Follow surgeon's recommendation for DC plan and follow-up therapies;SNF     Equipment Recommendations  None recommended by PT    Recommendations for Other Services OT consult     Precautions / Restrictions Precautions Precautions: Fall;Posterior Hip Precaution Comments: reviewed posterior hip precautions Restrictions Weight Bearing Restrictions: Yes LLE Weight Bearing: Partial weight bearing LLE Partial Weight Bearing Percentage or Pounds: 50    Mobility  Bed Mobility  General bed mobility comments: on BSC upon arrival  Transfers Overall transfer level: Needs assistance Equipment used: Rolling walker (2 wheeled) Transfers: Sit to/from Stand Sit to Stand: Min guard    General transfer comment: min G to power up with BUE assisting, cues for posterior hip precautions, min G to steady with slow rise  Ambulation/Gait Ambulation/Gait assistance: Min guard Gait Distance (Feet): 10 Feet (x2 with seated rest break between) Assistive device: Rolling walker (2 wheeled) Gait Pattern/deviations: Step-to pattern;Decreased stance time -  left;Decreased weight shift to left Gait velocity: decreased   General Gait Details: step to pattern, toe of L foot gently on floor with BUE assisting when stepping R foot forward, limited by fatigue   Stairs             Wheelchair Mobility    Modified Rankin (Stroke Patients Only)       Balance Overall balance assessment: Needs assistance Sitting-balance support: Bilateral upper extremity supported Sitting balance-Leahy Scale: Good     Standing balance support: Bilateral upper extremity supported;During functional activity Standing balance-Leahy Scale: Poor Standing balance comment: reliant on UE support       Cognition Arousal/Alertness: Awake/alert Behavior During Therapy: WFL for tasks assessed/performed Overall Cognitive Status: Within Functional Limits for tasks assessed       Exercises      General Comments General comments (skin integrity, edema, etc.): Pt on RA with SpO2 90-95% with mobility      Pertinent Vitals/Pain Pain Assessment: Faces Faces Pain Scale: Hurts a little bit Pain Location: L hip with mobility Pain Descriptors / Indicators: Grimacing;Guarding Pain Intervention(s): Limited activity within patient's tolerance;Monitored during session;Premedicated before session;Repositioned;Ice applied    Home Living                      Prior Function            PT Goals (current goals can now be found in the care plan section) Acute Rehab PT Goals Patient Stated Goal: to walk and go to daughter's house PT Goal Formulation: With patient Time For Goal Achievement: 09/22/20 Potential to Achieve Goals: Good Progress towards PT goals: Progressing toward goals    Frequency    7X/week  PT Plan Current plan remains appropriate    Co-evaluation              AM-PAC PT "6 Clicks" Mobility   Outcome Measure  Help needed turning from your back to your side while in a flat bed without using bedrails?: A Little Help  needed moving from lying on your back to sitting on the side of a flat bed without using bedrails?: A Little Help needed moving to and from a bed to a chair (including a wheelchair)?: A Little Help needed standing up from a chair using your arms (e.g., wheelchair or bedside chair)?: A Little Help needed to walk in hospital room?: A Little Help needed climbing 3-5 steps with a railing? : Total 6 Click Score: 16    End of Session Equipment Utilized During Treatment: Gait belt Activity Tolerance: Patient tolerated treatment well;Patient limited by fatigue Patient left: in chair;with call bell/phone within reach;with chair alarm set;with family/visitor present Nurse Communication: Mobility status;Other (comment) (SpO2) PT Visit Diagnosis: Pain;Muscle weakness (generalized) (M62.81);Other abnormalities of gait and mobility (R26.89);History of falling (Z91.81) Pain - Right/Left: Left Pain - part of body: Hip     Time: 0910-0929 PT Time Calculation (min) (ACUTE ONLY): 19 min  Charges:  $Gait Training: 8-22 mins                      Tori Clifford Coudriet PT, DPT 09/16/20, 10:36 AM

## 2020-09-16 NOTE — Progress Notes (Addendum)
Physical Therapy Treatment Patient Details Name: Isla Sabree MRN: [id number] DOB: [birth year] Today's Date: 09/16/2020    History of Present Illness Genevieve Alessandra Bevels, 86 y.o. female, has a history of pain and functional disability in the left hip due to trauma and arthritis and patient has failed non-surgical conservative treatments. Pt s/p conversion to posterior THR.    PT Comments    Pt remained up in chair since last session, requesting to transfer back to bed. Pt requiring min A to power up from sitting. Limited to a few steps at bedside due to nausea with mobility. Min A to lift LLE back into bed and to assist in maintaining posterior hip precaution with repositioning in bed. Lunch tray at bedside with daughter assisting to prepare when leaving. RN notified of nausea with mobility.   Follow Up Recommendations  Follow surgeon's recommendation for DC plan and follow-up therapies;SNF     Equipment Recommendations  None recommended by PT    Recommendations for Other Services OT consult     Precautions / Restrictions Precautions Precautions: Fall;Posterior Hip Precaution Comments: reviewed posterior hip precautions Restrictions Weight Bearing Restrictions: Yes LLE Weight Bearing: Partial weight bearing LLE Partial Weight Bearing Percentage or Pounds: 50    Mobility  Bed Mobility Overal bed mobility: Needs Assistance Bed Mobility: Sit to Supine  Sit to supine: Min assist   General bed mobility comments: min A to lift LLE into bed and maintain posterior hip precautions while positioning in bed, increased time due to pain, BUE assisting with bedrail to assist in positioning  Transfers Overall transfer level: Needs assistance Equipment used: Rolling walker (2 wheeled) Transfers: Sit to/from [employer] Sit to Stand: Min assist Stand pivot transfers: Min assist  General transfer comment: min A to power up from chair, cues for hand placement and steadying  assist to rise  Ambulation/Gait Ambulation/Gait assistance: Min guard Assistive device: Rolling walker (2 wheeled) Gait Pattern/deviations: Step-to pattern;Decreased stance time - left;Decreased weight shift to left Gait velocity: decreased   General Gait Details: limited to a few short steps at bedside, respecting WB precautions   Stairs             Wheelchair Mobility    Modified Rankin (Stroke Patients Only)       Balance Overall balance assessment: Needs assistance Sitting-balance support: Bilateral upper extremity supported Sitting balance-Leahy Scale: Good  Standing balance support: Bilateral upper extremity supported;During functional activity Standing balance-Leahy Scale: Poor Standing balance comment: reliant on UE support           Cognition Arousal/Alertness: Awake/alert Behavior During Therapy: WFL for tasks assessed/performed Overall Cognitive Status: Within Functional Limits for tasks assessed                 Exercises Total Joint Exercises Ankle Circles/Pumps: AROM;Strengthening;Both;Supine;10 reps Quad Sets: AROM;Strengthening;Both;Supine;10 reps    General Comments General comments (skin integrity, edema, etc.): Pt on RA with SpO2 90-95% with mobility      Pertinent Vitals/Pain Pain Assessment: Faces Faces Pain Scale: Hurts a little bit Pain Location: L hip with mobility Pain Descriptors / Indicators: Grimacing;Guarding Pain Intervention(s): Limited activity within patient's tolerance;Monitored during session;Repositioned;Ice applied    Home Living                      Prior Function            PT Goals (current goals can now be found in the care plan section) Acute Rehab PT Goals Patient Stated Goal:  to walk and go to daughter's house PT Goal Formulation: With patient Time For Goal Achievement: 09/22/20 Potential to Achieve Goals: Good Progress towards PT goals: Progressing toward goals    Frequency     7X/week      PT Plan Current plan remains appropriate    Co-evaluation              AM-PAC PT "6 Clicks" Mobility   Outcome Measure  Help needed turning from your back to your side while in a flat bed without using bedrails?: A Little Help needed moving from lying on your back to sitting on the side of a flat bed without using bedrails?: A Little Help needed moving to and from a bed to a chair (including a wheelchair)?: A Little Help needed standing up from a chair using your arms (e.g., wheelchair or bedside chair)?: A Little Help needed to walk in hospital room?: A Little Help needed climbing 3-5 steps with a railing? : Total 6 Click Score: 16    End of Session Equipment Utilized During Treatment: Gait belt Activity Tolerance: Patient tolerated treatment well;Other (comment) (limited by nausea) Patient left: in bed;with call bell/phone within reach;with family/visitor present Nurse Communication: Mobility status;Other (comment) (nausea medication) PT Visit Diagnosis: Pain;Muscle weakness (generalized) (M62.81);Other abnormalities of gait and mobility (R26.89);History of falling (Z91.81) Pain - Right/Left: Left Pain - part of body: Hip     Time: 1206-1216 PT Time Calculation (min) (ACUTE ONLY): 10 min  Charges:  $Therapeutic Activity: 8-22 mins                      Tori Madicyn Mesina PT, DPT 09/16/20, 12:54 PM

## 2020-09-16 NOTE — TOC Transition Note (Signed)
Transition of Care [facility]) - CM/SW Discharge Note   Patient Details  Name: [name] MRN: [id number] Date of Birth: December 15, 1932  [facility]) CM/SW Contact:  Amada Jupiter, LCSW Phone Number: 09/16/2020, 3:38 PM   Clinical Narrative:    Have been following pt for SNF placement.  Pt and family have accepted bed at [facility], however, cannot admit until tomorrow.  Have alerted MD as well as weekend TOC coverage.  Hope to transfer pt in the morning.    Final next level of care: Skilled Nursing Facility     Patient Goals and CMS Choice Patient states their goals for this hospitalization and ongoing recovery are:: to eventually return home with daughter      Discharge Placement PASRR number recieved: 09/15/20            Patient chooses bed at: [facility] Patient to be transferred to facility by: [facility] Name of family member notified: daughter    Discharge Plan and Services                DME Arranged: N/A DME Agency: NA       HH Arranged: NA HH Agency: NA        Social Determinants of Health (SDOH) Interventions     Readmission Risk Interventions No flowsheet data found.

## 2020-09-16 NOTE — Progress Notes (Signed)
     Subjective: 3 Days Post-Op Procedure(s) (LRB): CONVERSION TO LEFT TOTAL HIP-POSTERIOR APPROACH (Left)   Seen by Dr. Charlann Boxer. Patient reports pain as mild, pain controlled.  No reported events throughout the night.  Dr. Charlann Boxer discussed the findings and expectations moving forward.  Ready to be discharged to SNF when arranged.  Follow up in the clinic in 2 weeks.  Knows to call with any questions or concerns.     Objective:   VITALS:   Vitals:   09/16/20 0229 09/16/20 0531  BP: (!) 143/68 (!) 131/58  Pulse: 73 66  Resp: 18 18  Temp: 97.6 F (36.4 C) 97.9 F (36.6 C)  SpO2: 95% 98%    Dorsiflexion/Plantar flexion intact Incision: dressing C/D/I No cellulitis present Compartment soft  LABS Recent Labs    09/14/20 0304 09/15/20 0303  HGB 9.8* 9.2*  HCT 30.7* 28.6*  WBC 10.7* 11.6*  PLT 217 197    Recent Labs    09/14/20 0304 09/15/20 0303  NA 137 136  K 4.7 4.0  BUN 15 15  CREATININE 0.84 0.69  GLUCOSE 133* 153*     Assessment/Plan: 3 Days Post-Op Procedure(s) (LRB): CONVERSION TO LEFT TOTAL HIP-POSTERIOR APPROACH (Left)  Up with therapy Discharge to SNF when arranged Follow up in 2 weeks at [facility] Follow up with OLIN,Bleu Moisan D in 2 weeks.  Contact information:  [facility] [address], [address] [place] [address] [phone]             Lanney Gins PA-C  [facility]  [place] [address], [place], [place] [address] Phone: [phone] [email or web address] Facebook  [employer]

## 2020-09-16 NOTE — Care Management Important Message (Signed)
Important Message  Patient Details IM Letter given to the Patient Name: [name] MRN: [id number] Date of Birth: 20-Apr-1933   Medicare Important Message Given:  Yes     [name] 09/16/2020, 12:27 PM

## 2020-09-16 NOTE — Discharge Summary (Signed)
Patient ID: [name] MRN: [id number] DOB/AGE: [birth year] 86 y.o.  Admit date: 09/13/2020 Discharge date: 09/16/2020  Admission Diagnoses:  Active Problems:   S/P left THA, posterior   S/P revision of total hip   Overweight (BMI 25.0-29.9)   Discharge Diagnoses:  Same  Past Medical History:  Diagnosis Date  . Arthritis   . Atrial fibrillation, transient (HCC)    post op hip surgery; May 2021  . Diverticulitis   . Dyspnea    with exertion   . GERD (gastroesophageal reflux disease)   . Hyperlipidemia   . Hypertension   . Stroke (HCC)    hx opf mini strokes   . TIA (transient ischemic attack)     Surgeries: Procedure(s): CONVERSION TO LEFT TOTAL HIP-POSTERIOR APPROACH on 09/13/2020   Consultants: N/A  Discharged Condition: Improved  Hospital Course: [name] is an 86 y.o. female who was admitted 09/13/2020 for operative treatment of failed previous left hip ORIF surgery . Patient has severe unremitting pain that affects sleep, daily activities, and work/hobbies. After pre-op clearance the patient was taken to the operating room on 09/13/2020 and underwent  Procedure(s): CONVERSION TO LEFT TOTAL HIP-POSTERIOR APPROACH.    Patient was given perioperative antibiotics:  Anti-infectives (From admission, onward)   Start     Dose/Rate Route Frequency Ordered Stop   09/13/20 1500  ceFAZolin (ANCEF) IVPB 2g/100 mL premix        2 g 200 mL/hr over 30 Minutes Intravenous Every 6 hours 09/13/20 1059 09/13/20 2331   09/13/20 0630  ceFAZolin (ANCEF) IVPB 2g/100 mL premix        2 g 200 mL/hr over 30 Minutes Intravenous On call to O.R. 09/13/20 4401 09/13/20 0843       Patient was given sequential compression devices, early ambulation, and chemoprophylaxis to prevent DVT.  Patient benefited maximally from hospital stay and there were no complications.    Recent vital signs:  Patient Vitals for the past 24 hrs:  BP Temp Temp src Pulse Resp SpO2  09/16/20 0531 (!)  131/58 97.9 F (36.6 C) -- 66 18 98 %  09/16/20 0229 (!) 143/68 97.6 F (36.4 C) Oral 73 18 95 %  09/15/20 2132 (!) 151/65 (!) 97.5 F (36.4 C) Oral 76 16 95 %  09/15/20 1417 121/61 97.7 F (36.5 C) Oral 70 20 94 %     Recent laboratory studies:  Recent Labs    09/14/20 0304 09/14/20 0304 09/15/20 0303  WBC 10.7*  --  11.6*  HGB 9.8*  --  9.2*  HCT 30.7*  --  28.6*  PLT 217  --  197  NA 137  --  136  K 4.7  --  4.0  CL 103  --  103  CO2 25  --  26  BUN 15  --  15  CREATININE 0.84  --  0.69  GLUCOSE 133*  --  153*  CALCIUM 8.8*   < > 8.8*   < > = values in this interval not displayed.     Discharge Medications:   Allergies as of 09/16/2020      Reactions   Keflex [cephalexin] Other (See Comments)   "makes pt feel really bad"      Medication List    STOP taking these medications   oxyCODONE-acetaminophen 5-325 MG tablet Commonly known as: PERCOCET/ROXICET   Voltaren 1 % Gel Generic drug: diclofenac Sodium     TAKE these medications   acetaminophen 500 MG tablet Commonly known as:  TYLENOL Take 2 tablets (1,000 mg total) by mouth every 8 (eight) hours. What changed:   how much to take  when to take this  reasons to take this   amLODipine 10 MG tablet Commonly known as: NORVASC Take 10 mg by mouth every evening.   apixaban 5 MG Tabs tablet Commonly known as: Eliquis Take 1 tablet (5 mg total) by mouth 2 (two) times daily.   benazepril 20 MG tablet Commonly known as: LOTENSIN Take 20 mg by mouth 2 (two) times daily.   calcium carbonate 500 MG chewable tablet Commonly known as: TUMS - dosed in mg elemental calcium Chew 1-2 tablets by mouth 3 (three) times daily as needed for indigestion or heartburn.   cetirizine 10 MG tablet Commonly known as: ZYRTEC Take 10 mg by mouth daily.   docusate sodium 100 MG capsule Commonly known as: Colace Take 1 capsule (100 mg total) by mouth 2 (two) times daily.   ferrous sulfate 325 (65 FE) MG  tablet Commonly known as: FerrouSul Take 1 tablet (325 mg total) by mouth 3 (three) times daily with meals for 14 days. What changed:   how much to take  when to take this   gabapentin 300 MG capsule Commonly known as: NEURONTIN Take 300 mg by mouth 3 (three) times daily.   KCL-20 PO Take 20 mEq by mouth daily with supper.   labetalol 200 MG tablet Commonly known as: NORMODYNE Take 200 mg by mouth 2 (two) times daily.   methocarbamol 500 MG tablet Commonly known as: Robaxin Take 1 tablet (500 mg total) by mouth every 6 (six) hours as needed for muscle spasms.   multivitamin with minerals Tabs tablet Take 1 tablet by mouth daily.   oxyCODONE 5 MG immediate release tablet Commonly known as: Oxy IR/ROXICODONE Take 1 tablet (5 mg total) by mouth every 6 (six) hours as needed for moderate pain or severe pain.   polyethylene glycol 17 g packet Commonly known as: MIRALAX / GLYCOLAX Take 17 g by mouth 2 (two) times daily. What changed:   medication strength  when to take this   pravastatin 20 MG tablet Commonly known as: PRAVACHOL Take 20 mg by mouth at bedtime.   PRESERVISION AREDS 2 PO Take 1 tablet by mouth daily.   spironolactone 25 MG tablet Commonly known as: ALDACTONE Take 25 mg by mouth daily.   sucralfate 1 g tablet Commonly known as: CARAFATE Take 1 g by mouth 4 (four) times daily -  with meals and at bedtime.   temazepam 30 MG capsule Commonly known as: RESTORIL Take 30 mg by mouth at bedtime.   vitamin B-12 1000 MCG tablet Commonly known as: CYANOCOBALAMIN Take 1,000 mcg by mouth daily.   vitamin C 1000 MG tablet Take 1,000 mg by mouth daily.   Vitamin D3 125 MCG (5000 UT) Tabs Take 5,000 Units by mouth daily.   Vitamin E 400 units Tabs Take 400 Units by mouth daily.            Discharge Care Instructions  (From admission, onward)         Start     Ordered   09/16/20 0000  Change dressing       Comments: Maintain surgical  dressing until follow up in the clinic. If the edges start to pull up, may reinforce with tape. If the dressing is no longer working, may remove and cover with gauze and tape, but must keep the area dry and clean.  Call  with any questions or concerns.   09/16/20 0858   09/16/20 0000  Partial weight bearing       Question Answer Comment  % Body Weight 50   Laterality left   Extremity Lower      09/16/20 0858          Diagnostic Studies: DG Pelvis Portable  Result Date: 09/13/2020 CLINICAL DATA:  Status post left hip replacement today. EXAM: PORTABLE PELVIS 1-2 VIEWS COMPARISON:  CT of the pelvis 08/01/2020. FINDINGS: Fracture fixation hardware in the left hip has been removed. The patient has a new total hip arthroplasty. The device is located. No acute abnormality is identified. A residual cortical fragment off the medial and inferior neck of the left hip measuring 1.5 cm craniocaudal is noted. IMPRESSION: Status post left hip replacement.  No acute finding. Electronically Signed   By: Drusilla Kanner M.D.   On: 09/13/2020 11:36    Disposition: Skilled Nursing Facility  Discharge Instructions    Call MD / Call 911   Complete by: As directed    If you experience chest pain or shortness of breath, CALL 911 and be transported to the hospital emergency room.  If you develope a fever above 101 F, pus (white drainage) or increased drainage or redness at the wound, or calf pain, call your surgeon's office.   Change dressing   Complete by: As directed    Maintain surgical dressing until follow up in the clinic. If the edges start to pull up, may reinforce with tape. If the dressing is no longer working, may remove and cover with gauze and tape, but must keep the area dry and clean.  Call with any questions or concerns.   Constipation Prevention   Complete by: As directed    Drink plenty of fluids.  Prune juice may be helpful.  You may use a stool softener, such as Colace (over the counter)  100 mg twice a day.  Use MiraLax (over the counter) for constipation as needed.   Diet - low sodium heart healthy   Complete by: As directed    Discharge instructions   Complete by: As directed    Maintain surgical dressing until follow up in the clinic. If the edges start to pull up, may reinforce with tape. If the dressing is no longer working, may remove and cover with gauze and tape, but must keep the area dry and clean.  Follow up in 2 weeks at [facility]. Call with any questions or concerns.   Partial weight bearing   Complete by: As directed    % Body Weight: 50   Laterality: left   Extremity: Lower   TED hose   Complete by: As directed    Use stockings (TED hose) for 2 weeks on both leg(s).  You may remove them at night for sleeping.       Follow-up Information    Durene Romans, MD. Schedule an appointment as soon as possible for a visit in 2 weeks.   Specialty: Orthopedic Surgery Contact information: [address] [place] [address] [place] [address] [phone]                Signed: Genelle Gather [facility] 09/16/2020, 9:13 AM

## 2020-09-17 LAB — URINALYSIS, ROUTINE W REFLEX MICROSCOPIC
Bilirubin Urine: NEGATIVE
Glucose, UA: NEGATIVE mg/dL
Ketones, ur: NEGATIVE mg/dL
Nitrite: NEGATIVE
Protein, ur: NEGATIVE mg/dL
Specific Gravity, Urine: 1.012 (ref 1.005–1.030)
pH: 5 (ref 5.0–8.0)

## 2020-09-17 NOTE — TOC Transition Note (Signed)
Transition of Care [facility]) - CM/SW Discharge Note   Patient Details  Name: [name] MRN: [id number] Date of Birth: [birth year]  [facility] of [facility]) CM/SW Contact:  Darleene Cleaver, LCSW Phone Number: 09/17/2020, 10:36 AM   Clinical Narrative:     Patient to be d/c'ed today to [facility] [address].  Patient and family agreeable to plans will transport via ems RN to call report to [phone].  CSW left message on daughter Pat's voice mail.     Final next level of care: Skilled Nursing Facility Barriers to Discharge: Barriers Resolved   Patient Goals and CMS Choice Patient states their goals for this hospitalization and ongoing recovery are:: To go to SNF for rehab, then return back home. CMS Medicare.gov Compare Post Acute Care list provided to:: Patient Represenative (must comment) Choice offered to / list presented to : Adult Children  Discharge Placement PASRR number recieved: 09/15/20            Patient chooses bed at: [facility] Patient to be transferred to facility by: [facility] Name of family member notified: Left a message on daughter Pat's voice mail Patient and family notified of of transfer: 09/17/20  Discharge Plan and Services                DME Arranged: N/A DME Agency: NA       HH Arranged: NA HH Agency: NA        Social Determinants of Health (SDOH) Interventions     Readmission Risk Interventions No flowsheet data found.

## 2020-09-17 NOTE — Progress Notes (Signed)
    Subjective:  Patient reports pain as mild to moderate.  Denies N/V/CP/SOB. No c/o.  Objective:   VITALS:   Vitals:   09/16/20 1007 09/16/20 1336 09/16/20 2113 09/17/20 0544  BP: (!) 122/52 133/63 137/77 139/68  Pulse: 70 72 82 68  Resp: 20 17 15 15   Temp: 98 F (36.7 C) 97.9 F (36.6 C) 98.3 F (36.8 C) 97.9 F (36.6 C)  TempSrc: Oral Oral Oral Oral  SpO2: 94% 95% 100% 99%  Weight:      Height:        NAD ABD soft Sensation intact distally Intact pulses distally Dorsiflexion/Plantar flexion intact Incision: dressing C/D/I Compartment soft   Lab Results  Component Value Date   WBC 11.6 (H) 09/15/2020   HGB 9.2 (L) 09/15/2020   HCT 28.6 (L) 09/15/2020   MCV 84.9 09/15/2020   PLT 197 09/15/2020   BMET    Component Value Date/Time   NA 136 09/15/2020 0303   NA 137 07/08/2020 1130   K 4.0 09/15/2020 0303   CL 103 09/15/2020 0303   CO2 26 09/15/2020 0303   GLUCOSE 153 (H) 09/15/2020 0303   BUN 15 09/15/2020 0303   BUN 17 07/08/2020 1130   CREATININE 0.69 09/15/2020 0303   CALCIUM 8.8 (L) 09/15/2020 0303   GFRNONAA >60 09/15/2020 0303   GFRAA 75 07/08/2020 1130     Assessment/Plan: 4 Days Post-Op   Active Problems:   S/P left THA, posterior   S/P revision of total hip   Overweight (BMI 25.0-29.9)   WBAT with walker DVT ppx: apixaban, SCDs, TEDS PO pain control PT/OT Dispo: SNF placement    09-21-2001 Shali Vesey 09/17/2020, 8:34 AM   09/19/2020, MD [phone] [facility] is now [facility]  [place] [address], [place], [place] Phone: [phone] [email or web address] Facebook  [phone]

## 2020-09-17 NOTE — Progress Notes (Signed)
Physical Therapy Treatment Patient Details Name: [name] MRN: [id number] DOB: 07-May-1933 Today's Date: 09/17/2020    History of Present Illness [name], 86 y.o. female, has a history of pain and functional disability in the left hip due to trauma and arthritis and patient has failed non-surgical conservative treatments. Pt s/p conversion to posterior THR.    PT Comments    Pt reports feeling dizzy today, also became nauseated with mobility.   BP Supine: 121/62 mmHg Sitting: 87/61 mmHg (however pt moving UE) Sitting: 127/55 mmHg Standing: 121/56 mmHg   Follow Up Recommendations  Follow surgeon's recommendation for DC plan and follow-up therapies;SNF     Equipment Recommendations  None recommended by PT    Recommendations for Other Services       Precautions / Restrictions Precautions Precautions: Fall;Posterior Hip Precaution Comments: reviewed posterior hip precautions Restrictions Weight Bearing Restrictions: Yes LLE Weight Bearing: Partial weight bearing LLE Partial Weight Bearing Percentage or Pounds: 50    Mobility  Bed Mobility Overal bed mobility: Needs Assistance Bed Mobility: Supine to Sit     Supine to sit: Supervision;HOB elevated     General bed mobility comments: slow and effortful however no assist required, dizziness upon sitting  Transfers Overall transfer level: Needs assistance Equipment used: Rolling walker (2 wheeled) Transfers: Sit to/from Stand Sit to Stand: Min assist         General transfer comment: assist to rise and steady, control descent, cues for UE and LE positioning  Ambulation/Gait Ambulation/Gait assistance: Min guard Gait Distance (Feet): 2 Feet Assistive device: Rolling walker (2 wheeled)       General Gait Details: limited to a few steps and pt reported nausea and dizziness so recliner brought up to pt   Stairs             Wheelchair Mobility    Modified Rankin (Stroke Patients Only)        Balance                                            Cognition Arousal/Alertness: Awake/alert Behavior During Therapy: WFL for tasks assessed/performed Overall Cognitive Status: Within Functional Limits for tasks assessed                                        Exercises      General Comments        Pertinent Vitals/Pain Pain Assessment: Faces Faces Pain Scale: Hurts little more Pain Location: L hip with mobility Pain Descriptors / Indicators: Grimacing;Guarding Pain Intervention(s): Repositioned;Monitored during session;Ice applied    Home Living                      Prior Function            PT Goals (current goals can now be found in the care plan section) Progress towards PT goals: Progressing toward goals    Frequency    7X/week      PT Plan Current plan remains appropriate    Co-evaluation              AM-PAC PT "6 Clicks" Mobility   Outcome Measure  Help needed turning from your back to your side while in a flat bed without using bedrails?: A Little Help  needed moving from lying on your back to sitting on the side of a flat bed without using bedrails?: A Little Help needed moving to and from a bed to a chair (including a wheelchair)?: A Little Help needed standing up from a chair using your arms (e.g., wheelchair or bedside chair)?: A Little Help needed to walk in hospital room?: A Little Help needed climbing 3-5 steps with a railing? : Total 6 Click Score: 16    End of Session Equipment Utilized During Treatment: Gait belt Activity Tolerance: Other (comment) (limited by nausea and dizziness) Patient left: with call bell/phone within reach;with family/visitor present;in chair;with chair alarm set Nurse Communication: Mobility status PT Visit Diagnosis: Pain;Muscle weakness (generalized) (M62.81);Other abnormalities of gait and mobility (R26.89);History of falling (Z91.81) Pain - Right/Left:  Left Pain - part of body: Hip     Time: 4158-3094 PT Time Calculation (min) (ACUTE ONLY): 19 min  Charges:  $Gait Training: 8-22 mins                     Paulino Door, DPT Acute Rehabilitation Services Pager: [phone] Office: [phone]  [name] 09/17/2020, 12:39 PM

## 2020-10-28 ENCOUNTER — Other Ambulatory Visit: Payer: Self-pay

## 2020-10-28 ENCOUNTER — Ambulatory Visit (INDEPENDENT_AMBULATORY_CARE_PROVIDER_SITE_OTHER): Payer: Medicare Other | Admitting: Internal Medicine

## 2020-10-28 ENCOUNTER — Ambulatory Visit: Payer: Medicare Other | Admitting: Internal Medicine

## 2020-10-28 VITALS — BP 122/60 | HR 70 | Ht 67.0 in | Wt 187.0 lb

## 2020-10-28 DIAGNOSIS — D6869 Other thrombophilia: Secondary | ICD-10-CM

## 2020-10-28 DIAGNOSIS — I10 Essential (primary) hypertension: Secondary | ICD-10-CM | POA: Diagnosis not present

## 2020-10-28 DIAGNOSIS — G459 Transient cerebral ischemic attack, unspecified: Secondary | ICD-10-CM | POA: Diagnosis not present

## 2020-10-28 DIAGNOSIS — I480 Paroxysmal atrial fibrillation: Secondary | ICD-10-CM

## 2020-10-28 NOTE — Progress Notes (Signed)
PCP: Patient, No Pcp Per   Primary EP: Dr Bonna Gains is a 86 y.o. female who presents today for routine electrophysiology followup.  Since last being seen in our clinic, the patient reports doing very well.  She underwent hip surgery 6 weeks ago and is making good recovery.  No perioperative issues.  She is hoping to rehab and then return to live with her son in [place].   Today, she denies symptoms of palpitations, chest pain,  presyncope, or syncope.  She has had rare low BP with weakness and dizziness.  + mild edema on the side of her hip surgery.  SOB is stable and chronic. The patient is otherwise without complaint today.   Past Medical History:  Diagnosis Date  . Arthritis   . Atrial fibrillation, transient (HCC)    post op hip surgery; May 2021  . Diverticulitis   . Dyspnea    with exertion   . GERD (gastroesophageal reflux disease)   . Hyperlipidemia   . Hypertension   . Stroke (HCC)    hx opf mini strokes   . TIA (transient ischemic attack)    Past Surgical History:  Procedure Laterality Date  . 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/13/2020   Procedure: CONVERSION TO LEFT TOTAL HIP-POSTERIOR APPROACH;  Surgeon: Durene Romans, MD;  Location: [facility];  Service: Orthopedics;  Laterality: Left;  90 mins  . HIP FRACTURE SURGERY Left 04/16/2020  . KNEE SURGERY    . WRIST SURGERY      ROS- all systems are reviewed and negatives except as per HPI above  Current Outpatient Medications  Medication Sig Dispense Refill  . acetaminophen (TYLENOL) 500 MG tablet Take 2 tablets (1,000 mg total) by mouth every 8 (eight) hours. 30 tablet 0  . amLODipine (NORVASC) 10 MG tablet Take 10 mg by mouth every evening.     Marland Kitchen apixaban (ELIQUIS) 5 MG TABS tablet Take 1 tablet (5 mg total) by mouth 2 (two) times daily. 60 tablet 11  . Ascorbic Acid (VITAMIN C) 1000 MG tablet Take 1,000 mg by mouth daily.    . benazepril (LOTENSIN) 20 MG tablet Take 20 mg by mouth 2 (two) times daily.      . calcium carbonate (TUMS - DOSED IN MG ELEMENTAL CALCIUM) 500 MG chewable tablet Chew 1-2 tablets by mouth 3 (three) times daily as needed for indigestion or heartburn.    . cetirizine (ZYRTEC) 10 MG tablet Take 10 mg by mouth daily.     . Cholecalciferol (VITAMIN D3) 125 MCG (5000 UT) TABS Take 5,000 Units by mouth daily.    Marland Kitchen docusate sodium (COLACE) 100 MG capsule Take 1 capsule (100 mg total) by mouth 2 (two) times daily. 28 capsule 0  . gabapentin (NEURONTIN) 300 MG capsule Take 300 mg by mouth 3 (three) times daily.    Marland Kitchen labetalol (NORMODYNE) 200 MG tablet Take 200 mg by mouth 2 (two) times daily.    . methocarbamol (ROBAXIN) 500 MG tablet Take 1 tablet (500 mg total) by mouth every 6 (six) hours as needed for muscle spasms. 40 tablet 0  . Multiple Vitamin (MULTIVITAMIN WITH MINERALS) TABS tablet Take 1 tablet by mouth daily.    . Multiple Vitamins-Minerals (PRESERVISION AREDS 2 PO) Take 1 tablet by mouth daily.    . polyethylene glycol (MIRALAX / GLYCOLAX) 17 g packet Take 17 g by mouth 2 (two) times daily. 28 packet 0  . Potassium Chloride (KCL-20 PO) Take 20 mEq by  mouth daily with supper.     . pravastatin (PRAVACHOL) 20 MG tablet Take 20 mg by mouth at bedtime.     Marland Kitchen spironolactone (ALDACTONE) 25 MG tablet Take 25 mg by mouth daily.     . sucralfate (CARAFATE) 1 g tablet Take 1 g by mouth 4 (four) times daily -  with meals and at bedtime.    . temazepam (RESTORIL) 30 MG capsule Take 30 mg by mouth at bedtime.    . vitamin B-12 (CYANOCOBALAMIN) 1000 MCG tablet Take 1,000 mcg by mouth daily.    . Vitamin E 400 units TABS Take 400 Units by mouth daily.     . ferrous sulfate (FERROUSUL) 325 (65 FE) MG tablet Take 1 tablet (325 mg total) by mouth 3 (three) times daily with meals for 14 days. 42 tablet 0   No current facility-administered medications for this visit.    Physical Exam: Vitals:   10/28/20 1230  BP: 122/60  Pulse: 70  SpO2: 96%  Weight: 187 lb (84.8 kg)  Height: 5'  7" (1.702 m)    GEN- The patient is elderly appearing, alert and oriented x 3 today.   Head- normocephalic, atraumatic Eyes-  Sclera clear, conjunctiva pink Ears- hearing intact Oropharynx- clear Lungs- Clear to ausculation bilaterally, normal work of breathing Heart- Regular rate and rhythm, no murmurs, rubs or gallops, PMI not laterally displaced GI- soft, NT, ND, + BS Extremities- no clubbing, cyanosis, +1 L leg edema In a wheelchair today  Wt Readings from Last 3 Encounters:  10/28/20 187 lb (84.8 kg)  09/13/20 176 lb (79.8 kg)  07/21/20 177 lb 6.4 oz (80.5 kg)    EKG tracing ordered today is personally reviewed and shows sinus with PACs  Event monitor is reviewed and reveals no afib.  Assessment and Plan:  1. Paroxysmal atria fibrillation Doing well currently chads2vasc score is at least 6  She is on eliquis  2. HTN Stable No change required today Well controlled.  She may benefit from reducing her medication.  I discussed at length with son in law Brewing technologist).  We will continue current medicines for now per his preference but have a low threshold to reduce medicine if BP is low in the future.  3. HL Continue statin  Risks, benefits and potential toxicities for medications prescribed and/or refilled reviewed with patient today.   Return to see me in 4 months  Hillis Range MD, [facility] 10/28/2020 12:37 PM

## 2020-10-28 NOTE — Patient Instructions (Addendum)
Medication Instructions:  Your physician recommends that you continue on your current medications as directed. Please refer to the Current Medication list given to you today.  *If you need a refill on your cardiac medications before your next appointment, please call your pharmacy*  Lab Work: None ordered.  If you have labs (blood work) drawn today and your tests are completely normal, you will receive your results only by: Marland Kitchen MyChart Message (if you have MyChart) OR . A paper copy in the mail If you have any lab test that is abnormal or we need to change your treatment, we will call you to review the results.  Testing/Procedures: None ordered.  Follow-Up: At [facility], you and your health needs are our priority.  As part of our continuing mission to provide you with exceptional heart care, we have created designated Provider Care Teams.  These Care Teams include your primary Cardiologist (physician) and Advanced Practice Providers (APPs -  Physician Assistants and Nurse Practitioners) who all work together to provide you with the care you need, when you need it.  We recommend signing up for the patient portal called "MyChart".  Sign up information is provided on this After Visit Summary.  MyChart is used to connect with patients for Virtual Visits (Telemedicine).  Patients are able to view lab/test results, encounter notes, upcoming appointments, etc.  Non-urgent messages can be sent to your provider as well.   To learn more about what you can do with MyChart, go to [email or web address].    Your next appointment:   Your physician wants you to follow-up in: 02/27/21 at 11:45 am with Dr. Johney Frame.     Other Instructions:

## 2021-02-27 ENCOUNTER — Ambulatory Visit (INDEPENDENT_AMBULATORY_CARE_PROVIDER_SITE_OTHER): Payer: Medicare Other | Admitting: Internal Medicine

## 2021-02-27 ENCOUNTER — Encounter: Payer: Self-pay | Admitting: Internal Medicine

## 2021-02-27 ENCOUNTER — Other Ambulatory Visit: Payer: Self-pay

## 2021-02-27 VITALS — BP 128/72 | HR 64 | Ht 67.0 in | Wt 179.4 lb

## 2021-02-27 DIAGNOSIS — G459 Transient cerebral ischemic attack, unspecified: Secondary | ICD-10-CM

## 2021-02-27 DIAGNOSIS — I10 Essential (primary) hypertension: Secondary | ICD-10-CM

## 2021-02-27 DIAGNOSIS — I480 Paroxysmal atrial fibrillation: Secondary | ICD-10-CM | POA: Diagnosis not present

## 2021-02-27 IMAGING — CT CT PELVIS W/O CM
2 series · 13 of 32 positions shown, 19 images · non-contrast
Comparison: None.

CLINICAL DATA: Evaluate left femur fracture healing.

EXAM:
CT PELVIS WITHOUT CONTRAST
TECHNIQUE: Multidetector CT imaging of the pelvis was performed following the
standard protocol without intravenous contrast.

[Series 2: soft tissue pelvis without · axial · non-contrast · 0.93mm/px · z∈[-398,-113]mm · 10 of 71 slices shown, 16 images]
[im 7/71  soft-tissue]
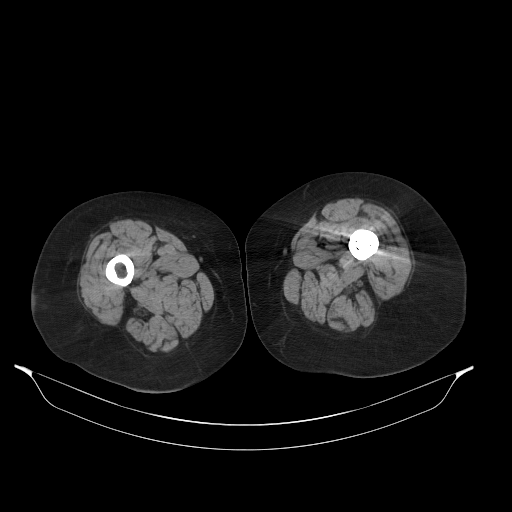
[im 7/71  bone]
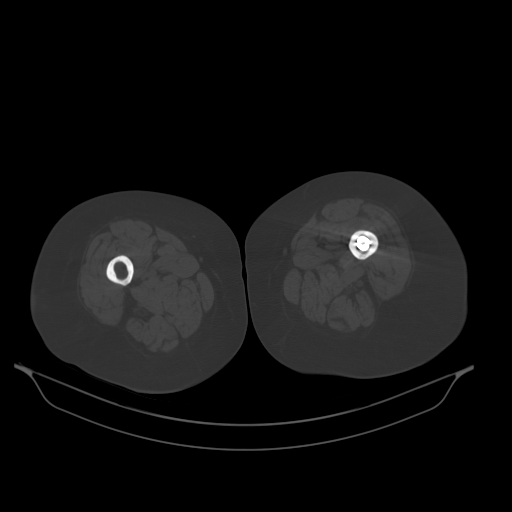
[im 13/71  soft-tissue]
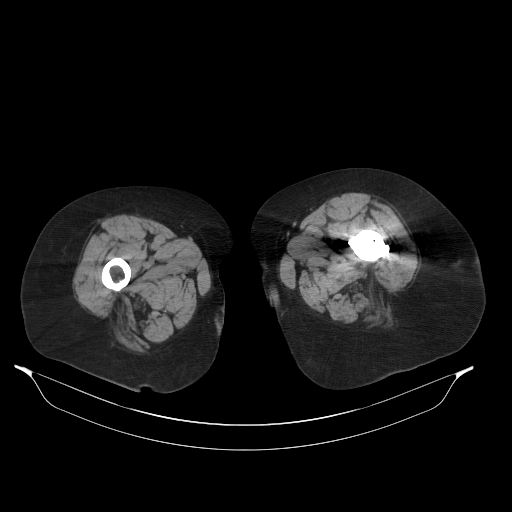
[im 20/71  soft-tissue]
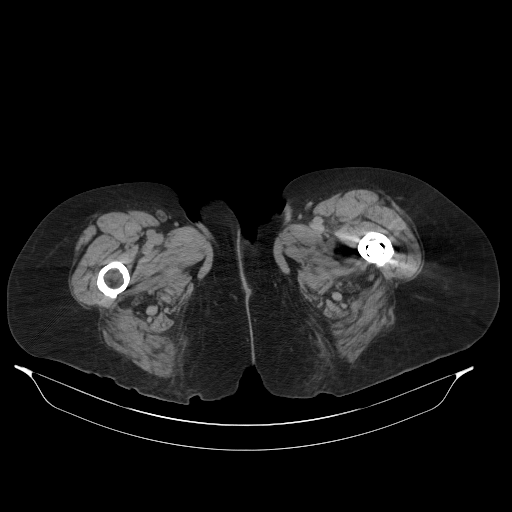
[im 26/71  soft-tissue]
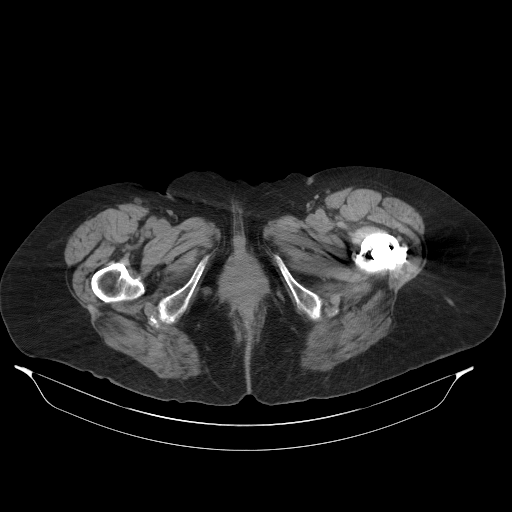
[im 32/71  soft-tissue]
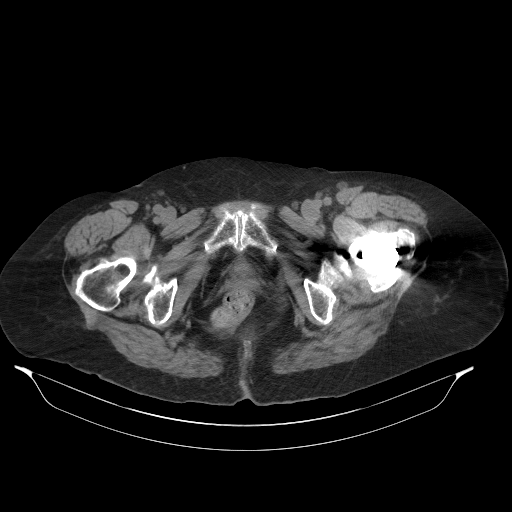
[im 39/71  soft-tissue]
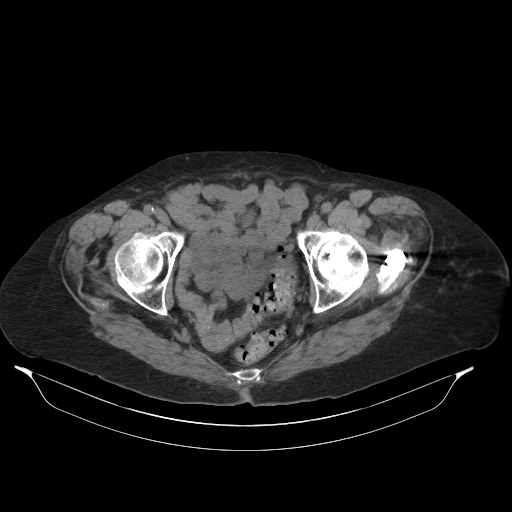
[im 45/71  soft-tissue]
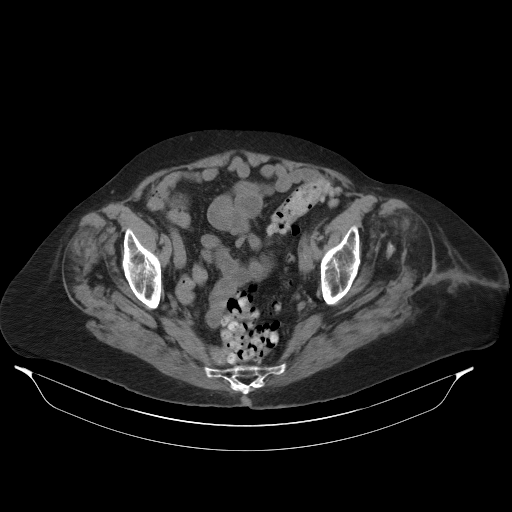
[im 45/71  lung]
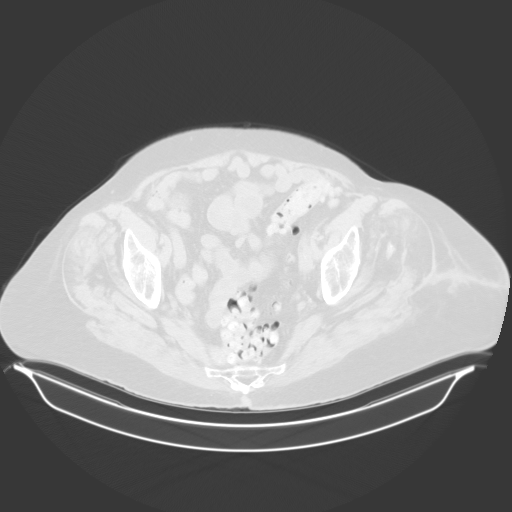
[im 51/71  soft-tissue]
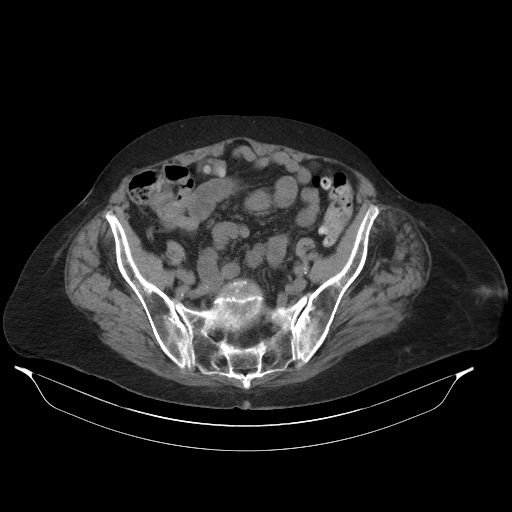
[im 51/71  lung]
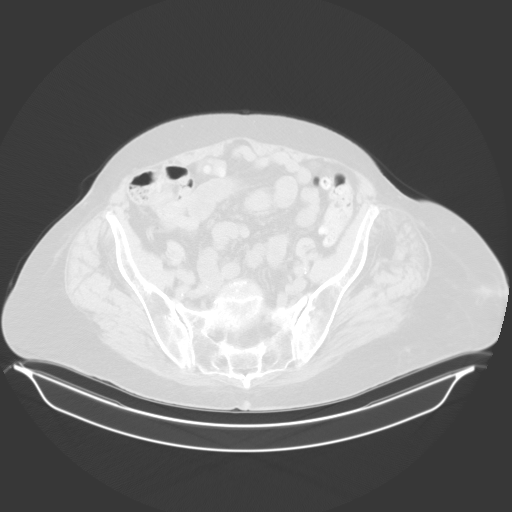
[im 58/71  soft-tissue]
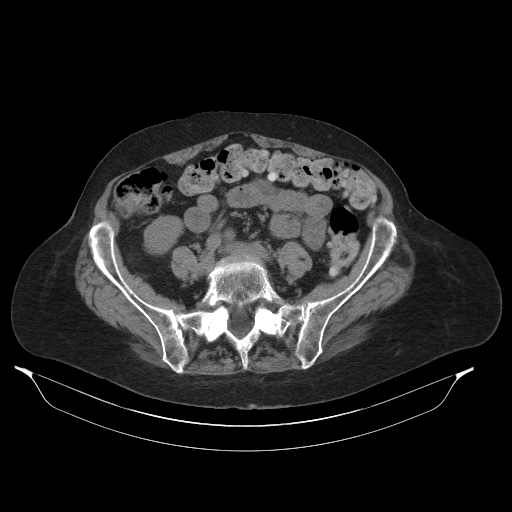
[im 58/71  lung]
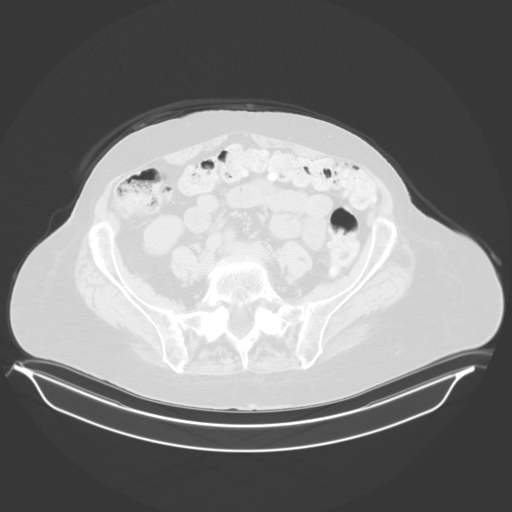
[im 58/71  bone]
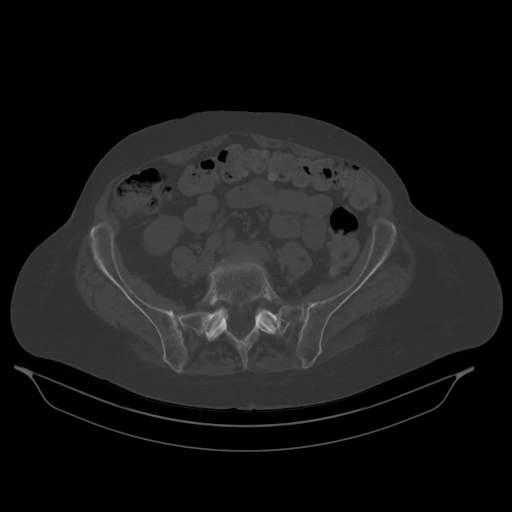
[im 64/71  soft-tissue]
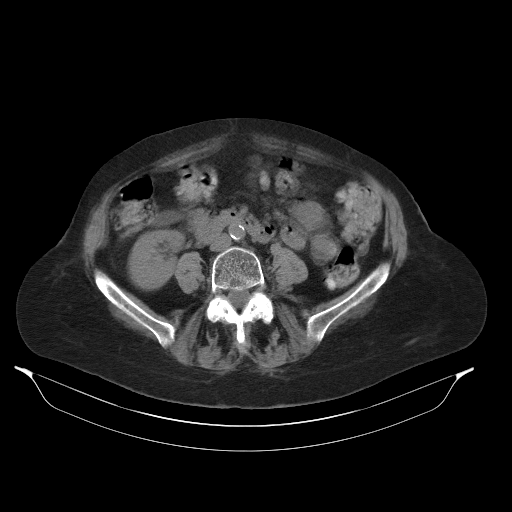
[im 64/71  lung]
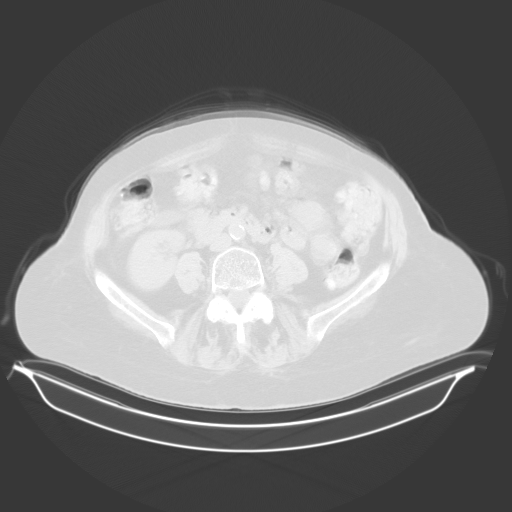

[Series 3: bone · axial · 0.93mm/px · z∈[-393,-321]mm · 3 of 118 slices shown]
[im 13/118  bone]
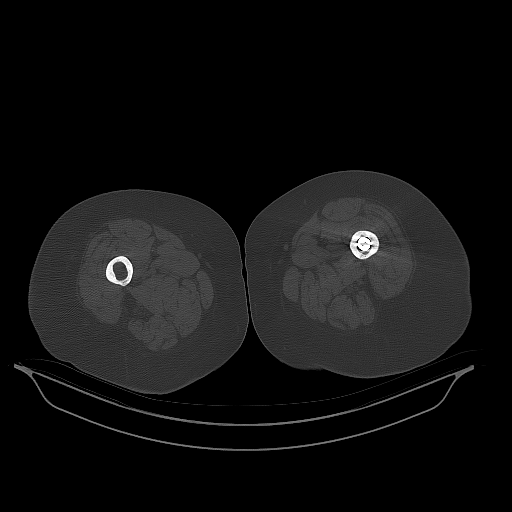
[im 25/118  bone]
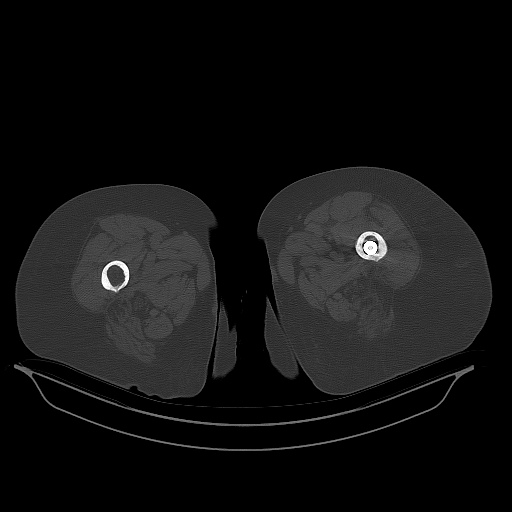
[im 37/118  bone]
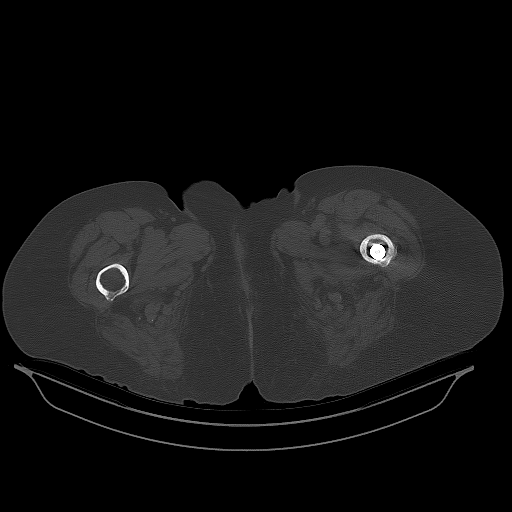

[13 of 32 positions shown; findings below may reference images not displayed]

FINDINGS: Musculoskeletal: Chronic left intertrochanteric femur fracture
status post gamma nail fixation. There is some bridging bone along
the anterior inferior margin of the fracture, but the fracture
margins largely remain nonunited. The dominant greater trochanter
fragment is also nonunited. The nondisplaced component extending
into the subtrochanteric lateral cortex has partially healed (series
3, image 87). No evidence of hardware failure or loosening.

Mild-to-moderate left and mild right hip osteoarthritis.
Chondrocalcinosis in the right hip. No joint effusion. Heterotopic
ossification superior to the left greater trochanter. Atrophy of the
bilateral gluteus medius and minimus muscles.

Suspected chronic angulated fracture deformity of the sacrum at S2,
best appreciated on the sagittal images.

Urinary Tract:  No abnormality visualized.

Bowel:  Severe colonic diverticulosis.  Normal appendix.

Vascular/Lymphatic: Aortoiliac atherosclerotic vascular disease. No
enlarged pelvic lymph nodes.

Reproductive:  Status post hysterectomy.  No adnexal mass.

Other:  None.
IMPRESSION: 1. Chronic left intertrochanteric femur fracture status post gamma
nail fixation. There is some bridging bone along the anterior
inferior margin of the fracture, but the fracture margins largely
remain nonunited.
2. Aortic Atherosclerosis ([id number]-GBO.O).

## 2021-02-27 NOTE — Progress Notes (Signed)
PCP: Patient, [name]   Primary EP: Dr [name] is a 87 y.o. female who presents today for routine electrophysiology followup. She is accompanied by her daugther. Since last being seen in our clinic, the patient reports doing reasonably well. She has some headaches and dizziness, but she denies any syncopal episodes. She also has some shortness of breath, but nothing she is concerned about. Today, she denies symptoms of palpitations, chest pain, tightness, or pressure, or lower extremity edema.  The patient is otherwise without complaint today.  She plans move back to [place] once she improves more.   Past Medical History:  Diagnosis Date  . Arthritis   . Atrial fibrillation, transient (HCC)    post op hip surgery; May 2021  . Diverticulitis   . Dyspnea    with exertion   . GERD (gastroesophageal reflux disease)   . Hyperlipidemia   . Hypertension   . Stroke (HCC)    hx opf mini strokes   . TIA (transient ischemic attack)    Past Surgical History:  Procedure Laterality Date  . 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/13/2020   Procedure: CONVERSION TO LEFT TOTAL HIP-POSTERIOR APPROACH;  Surgeon: Durene Romans, MD;  Location: [facility];  Service: Orthopedics;  Laterality: Left;  90 mins  . HIP FRACTURE SURGERY Left 04/16/2020  . KNEE SURGERY    . WRIST SURGERY      ROS- all systems are reviewed and negatives except as per HPI above  Current Outpatient Medications  Medication Sig Dispense Refill  . acetaminophen (TYLENOL) 500 MG tablet Take 2 tablets (1,000 mg total) by mouth every 8 (eight) hours. 30 tablet 0  . amLODipine (NORVASC) 10 MG tablet Take 10 mg by mouth every evening.     Marland Kitchen apixaban (ELIQUIS) 5 MG TABS tablet Take 1 tablet (5 mg total) by mouth 2 (two) times daily. 60 tablet 11  . Ascorbic Acid (VITAMIN C) 1000 MG tablet Take 1,000 mg by mouth daily.    . benazepril (LOTENSIN) 20 MG tablet Take 20 mg by mouth 2 (two) times daily.     . calcium  carbonate (TUMS - DOSED IN MG ELEMENTAL CALCIUM) 500 MG chewable tablet Chew 1-2 tablets by mouth 3 (three) times daily as needed for indigestion or heartburn.    . cetirizine (ZYRTEC) 10 MG tablet Take 10 mg by mouth daily.     . Cholecalciferol (VITAMIN D3) 125 MCG (5000 UT) TABS Take 5,000 Units by mouth daily.    Marland Kitchen docusate sodium (COLACE) 100 MG capsule Take 1 capsule (100 mg total) by mouth 2 (two) times daily. 28 capsule 0  . gabapentin (NEURONTIN) 300 MG capsule Take 300 mg by mouth 3 (three) times daily.    Marland Kitchen labetalol (NORMODYNE) 200 MG tablet Take 200 mg by mouth 2 (two) times daily.    . methocarbamol (ROBAXIN) 500 MG tablet Take 1 tablet (500 mg total) by mouth every 6 (six) hours as needed for muscle spasms. 40 tablet 0  . Multiple Vitamin (MULTIVITAMIN WITH MINERALS) TABS tablet Take 1 tablet by mouth daily.    . Multiple Vitamins-Minerals (PRESERVISION AREDS 2 PO) Take 1 tablet by mouth daily.    . polyethylene glycol (MIRALAX / GLYCOLAX) 17 g packet Take 17 g by mouth 2 (two) times daily. 28 packet 0  . Potassium Chloride (KCL-20 PO) Take 20 mEq by mouth daily with supper.     . pravastatin (PRAVACHOL) 20 MG tablet Take 20 mg  by mouth at bedtime.     Marland Kitchen spironolactone (ALDACTONE) 25 MG tablet Take 25 mg by mouth daily.     . sucralfate (CARAFATE) 1 g tablet Take 1 g by mouth 4 (four) times daily -  with meals and at bedtime.    . temazepam (RESTORIL) 30 MG capsule Take 30 mg by mouth at bedtime.    . vitamin B-12 (CYANOCOBALAMIN) 1000 MCG tablet Take 1,000 mcg by mouth daily.    . Vitamin E 400 units TABS Take 400 Units by mouth daily.     . ciprofloxacin (CIPRO) 500 MG tablet Take 500 mg by mouth 2 (two) times daily.    . ferrous sulfate (FERROUSUL) 325 (65 FE) MG tablet Take 1 tablet (325 mg total) by mouth 3 (three) times daily with meals for 14 days. 42 tablet 0   No current facility-administered medications for this visit.    Physical Exam: Vitals:   02/27/21 1212  BP:  128/72  Pulse: 64  SpO2: 97%  Weight: 179 lb 6.4 oz (81.4 kg)  Height: 5\' 7"  (1.702 m)    GEN- The patient is elderly appearing, alert and oriented x 3 today.   Head- normocephalic, atraumatic Eyes-  Sclera clear, conjunctiva pink Ears- hearing intact Oropharynx- clear Lungs- Clear to ausculation bilaterally, normal work of breathing Heart- Regular rate and rhythm, no murmurs, rubs or gallops, PMI not laterally displaced GI- soft, NT, ND, + BS Extremities- no clubbing, cyanosis, or edema  Wt Readings from Last 3 Encounters:  02/27/21 179 lb 6.4 oz (81.4 kg)  10/28/20 187 lb (84.8 kg)  09/13/20 176 lb (79.8 kg)    EKG tracing ordered today is personally reviewed and shows sinus  Assessment and Plan:  1. Paroxysmal atrial fibrillation Doing well  chads2vasc score is 6.  She is on eliquis  2. HTN Stable No change required today  3. HL Continue statin  Risks, benefits and potential toxicities for medications prescribed and/or refilled reviewed with patient today.   Return to see EP PA in 6 months   I,[name],acting as a scribe for 09/15/20, MD.,have documented all relevant documentation on the behalf of Katelyn Range, MD,as directed by  Katelyn Range, MD while in the presence of Katelyn Range, MD. I, Katelyn Range, MD, have reviewed all documentation for this visit. The documentation on 02/27/21 for the exam, diagnosis, procedures, and orders are all accurate and complete.   04/29/21 MD, [facility] 02/27/2021 12:26 PM

## 2021-02-27 NOTE — Patient Instructions (Addendum)
Medication Instructions:  Your physician recommends that you continue on your current medications as directed. Please refer to the Current Medication list given to you today.  Labwork: None ordered.  Testing/Procedures: None ordered.  Follow-Up: Your physician wants you to follow-up in: 6 months with James Allred, MD or one of the following Advanced Practice Providers on your designated Care Team:    Renee Ursuy, PA-C    You will receive a reminder letter in the mail two months in advance. If you don't receive a letter, please call our office to schedule the follow-up appointment.   Any Other Special Instructions Will Be Listed Below (If Applicable).  If you need a refill on your cardiac medications before your next appointment, please call your pharmacy.        

## 2021-02-27 NOTE — Progress Notes (Deleted)
PCP: Patient, [name] Primary Cardiologist: *** Primary EP: Dr Bonna Gains is a 88 y.o. female who presents today for routine electrophysiology followup.  Since last being seen in our clinic, the patient reports doing very well.  Today, she denies symptoms of palpitations, chest pain, shortness of breath,  lower extremity edema, dizziness, presyncope, or syncope.  The patient is otherwise without complaint today.   Past Medical History:  Diagnosis Date  . Arthritis   . Atrial fibrillation, transient (HCC)    post op hip surgery; May 2021  . Diverticulitis   . Dyspnea    with exertion   . GERD (gastroesophageal reflux disease)   . Hyperlipidemia   . Hypertension   . Stroke (HCC)    hx opf mini strokes   . TIA (transient ischemic attack)    Past Surgical History:  Procedure Laterality Date  . 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/13/2020   Procedure: CONVERSION TO LEFT TOTAL HIP-POSTERIOR APPROACH;  Surgeon: Durene Romans, MD;  Location: [facility];  Service: Orthopedics;  Laterality: Left;  90 mins  . HIP FRACTURE SURGERY Left 04/16/2020  . KNEE SURGERY    . WRIST SURGERY      ROS- all systems are reviewed and negatives except as per HPI above  Current Outpatient Medications  Medication Sig Dispense Refill  . acetaminophen (TYLENOL) 500 MG tablet Take 2 tablets (1,000 mg total) by mouth every 8 (eight) hours. 30 tablet 0  . amLODipine (NORVASC) 10 MG tablet Take 10 mg by mouth every evening.     Marland Kitchen apixaban (ELIQUIS) 5 MG TABS tablet Take 1 tablet (5 mg total) by mouth 2 (two) times daily. 60 tablet 11  . Ascorbic Acid (VITAMIN C) 1000 MG tablet Take 1,000 mg by mouth daily.    . benazepril (LOTENSIN) 20 MG tablet Take 20 mg by mouth 2 (two) times daily.     . calcium carbonate (TUMS - DOSED IN MG ELEMENTAL CALCIUM) 500 MG chewable tablet Chew 1-2 tablets by mouth 3 (three) times daily as needed for indigestion or heartburn.    . cetirizine (ZYRTEC) 10 MG  tablet Take 10 mg by mouth daily.     . Cholecalciferol (VITAMIN D3) 125 MCG (5000 UT) TABS Take 5,000 Units by mouth daily.    Marland Kitchen docusate sodium (COLACE) 100 MG capsule Take 1 capsule (100 mg total) by mouth 2 (two) times daily. 28 capsule 0  . gabapentin (NEURONTIN) 300 MG capsule Take 300 mg by mouth 3 (three) times daily.    Marland Kitchen labetalol (NORMODYNE) 200 MG tablet Take 200 mg by mouth 2 (two) times daily.    . methocarbamol (ROBAXIN) 500 MG tablet Take 1 tablet (500 mg total) by mouth every 6 (six) hours as needed for muscle spasms. 40 tablet 0  . Multiple Vitamin (MULTIVITAMIN WITH MINERALS) TABS tablet Take 1 tablet by mouth daily.    . Multiple Vitamins-Minerals (PRESERVISION AREDS 2 PO) Take 1 tablet by mouth daily.    . polyethylene glycol (MIRALAX / GLYCOLAX) 17 g packet Take 17 g by mouth 2 (two) times daily. 28 packet 0  . Potassium Chloride (KCL-20 PO) Take 20 mEq by mouth daily with supper.     . pravastatin (PRAVACHOL) 20 MG tablet Take 20 mg by mouth at bedtime.     Marland Kitchen spironolactone (ALDACTONE) 25 MG tablet Take 25 mg by mouth daily.     . sucralfate (CARAFATE) 1 g tablet Take 1 g by mouth  4 (four) times daily -  with meals and at bedtime.    . temazepam (RESTORIL) 30 MG capsule Take 30 mg by mouth at bedtime.    . vitamin B-12 (CYANOCOBALAMIN) 1000 MCG tablet Take 1,000 mcg by mouth daily.    . Vitamin E 400 units TABS Take 400 Units by mouth daily.     . ciprofloxacin (CIPRO) 500 MG tablet Take 500 mg by mouth 2 (two) times daily.    . ferrous sulfate (FERROUSUL) 325 (65 FE) MG tablet Take 1 tablet (325 mg total) by mouth 3 (three) times daily with meals for 14 days. 42 tablet 0   No current facility-administered medications for this visit.    Physical Exam: Vitals:   02/27/21 1212  BP: 128/72  Pulse: 64  SpO2: 97%  Weight: 179 lb 6.4 oz (81.4 kg)  Height: 5\' 7"  (1.702 m)    GEN- The patient is well appearing, alert and oriented x 3 today.   Head- normocephalic,  atraumatic Eyes-  Sclera clear, conjunctiva pink Ears- hearing intact Oropharynx- clear Lungs- Clear to ausculation bilaterally, normal work of breathing Heart- Regular rate and rhythm, no murmurs, rubs or gallops, PMI not laterally displaced GI- soft, NT, ND, + BS Extremities- no clubbing, cyanosis, or edema  Wt Readings from Last 3 Encounters:  02/27/21 179 lb 6.4 oz (81.4 kg)  10/28/20 187 lb (84.8 kg)  09/13/20 176 lb (79.8 kg)    EKG tracing ordered today is personally reviewed and shows sinus  Assessment and Plan:  1. Paroxysmal atrial fibrillation Doing well  chads2vasc score is 6.  She is on eliquis  2. HTN Stable No change required today  3. HL Continue statin  Risks, benefits and potential toxicities for medications prescribed and/or refilled reviewed with patient today.   Return to see EP PA in 6 months  09/15/20 MD, [facility] 02/27/2021 12:23 PM

## 2021-04-11 IMAGING — DX DG PORTABLE PELVIS
1 series · 1 of 1 positions shown · non-contrast
Comparison: CT of the pelvis 08/01/2020.

CLINICAL DATA: Status post left hip replacement today.

EXAM:
PORTABLE PELVIS 1-2 VIEWS

[pelvis ap]
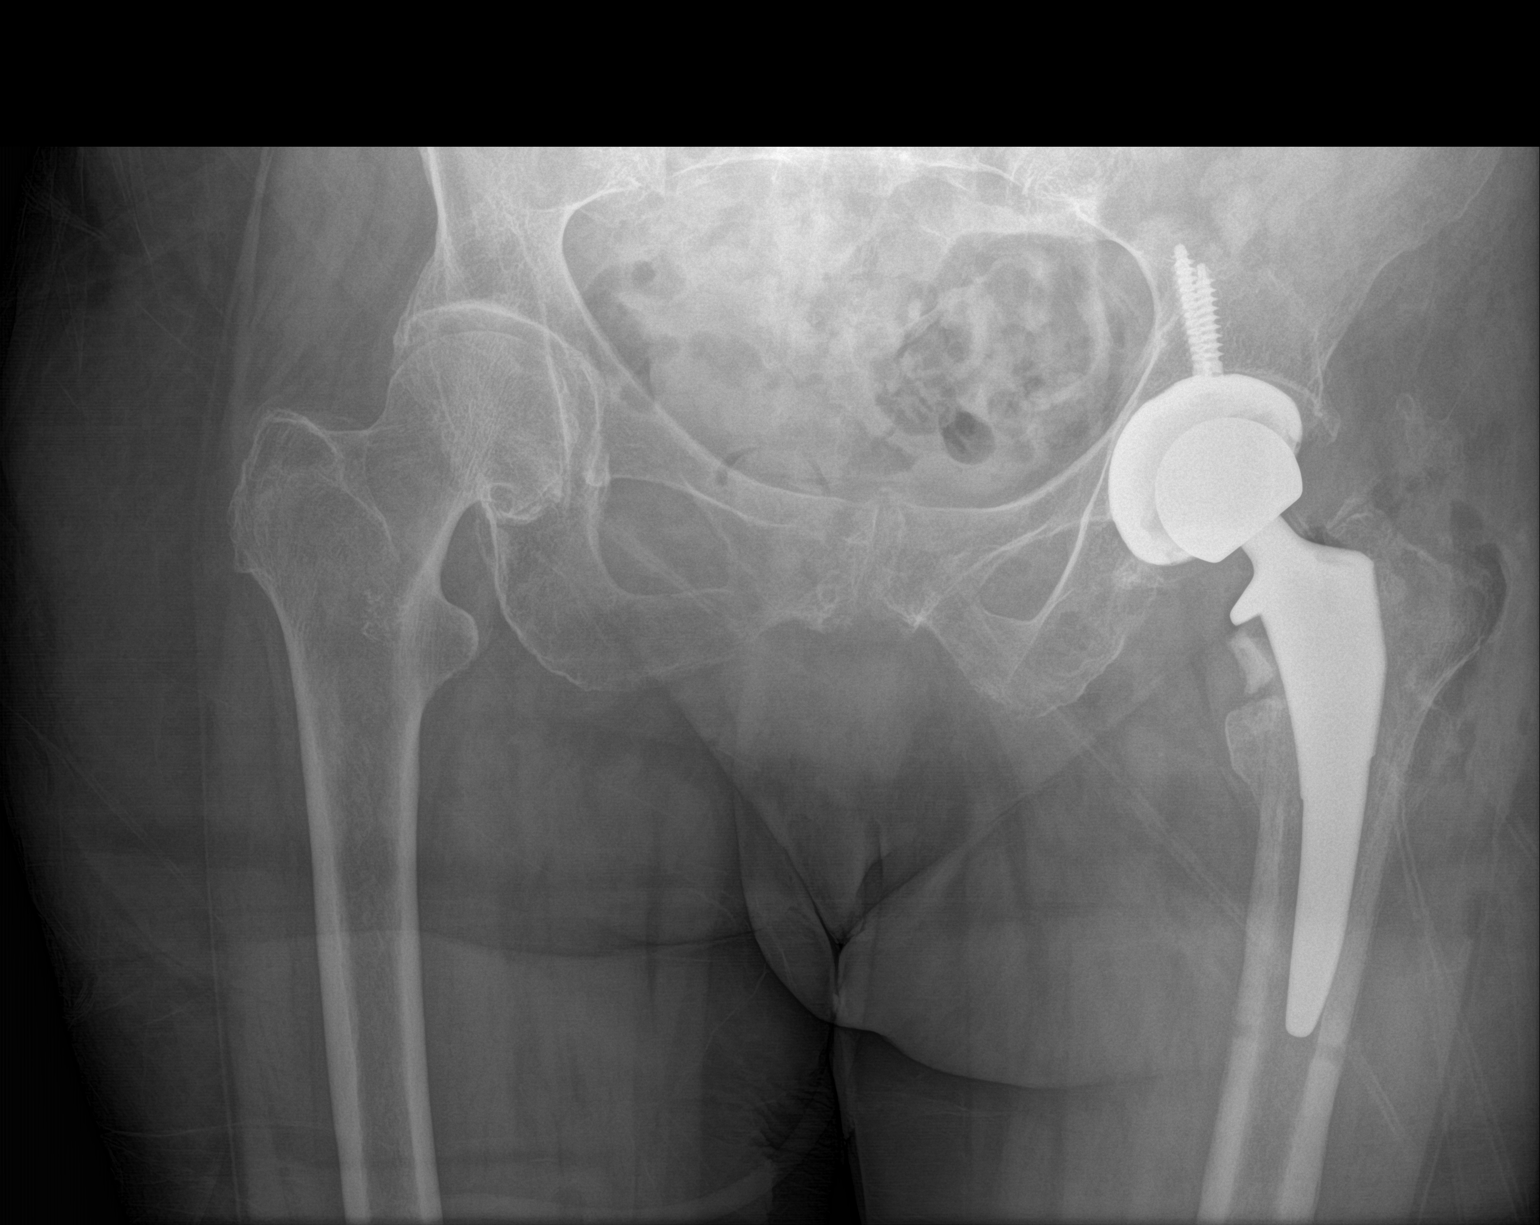

[1 of 1 positions shown; findings below may reference images not displayed]

FINDINGS: Fracture fixation hardware in the left hip has been removed. The
patient has a new total hip arthroplasty. The device is located. No
acute abnormality is identified. A residual cortical fragment off
the medial and inferior neck of the left hip measuring 1.5 cm
craniocaudal is noted.
IMPRESSION: Status post left hip replacement.  No acute finding.

## 2021-06-30 ENCOUNTER — Telehealth: Payer: Self-pay | Admitting: Internal Medicine

## 2021-06-30 MED ORDER — DILTIAZEM HCL 30 MG PO TABS
30.0000 mg | ORAL_TABLET | Freq: Four times a day (QID) | ORAL | 1 refills | Status: DC | PRN
Start: 2021-06-30 — End: 2022-12-04

## 2021-06-30 NOTE — Telephone Encounter (Signed)
The patients daughter [name]) called me to inform me that [name] is back in afib.  + palpitations and fatigue.  I spoke with [name]'s son in law Media planner) who is a Development worker, community.  He reports that her heart rates are 110s-120s with BP 130s/70s.  He reports that she is otherwise clinically stable.  She is compliant with eliquis.  Recs: Start diltiazem 30mg  1-2 tabs q6h prn for HR < 100 bpm. I will arrange AF clinic follow-up  MD, [facility] 06/30/2021 9:56 AM

## 2021-07-04 ENCOUNTER — Ambulatory Visit (HOSPITAL_COMMUNITY)
Admission: RE | Admit: 2021-07-04 | Discharge: 2021-07-04 | Disposition: A | Discharge status: Home / Self Care | Payer: Medicare Other | Source: Ambulatory Visit | Attending: Nurse Practitioner | Admitting: Nurse Practitioner

## 2021-07-04 ENCOUNTER — Other Ambulatory Visit: Payer: Self-pay

## 2021-07-04 ENCOUNTER — Encounter (HOSPITAL_COMMUNITY): Payer: Self-pay | Admitting: Nurse Practitioner

## 2021-07-04 VITALS — BP 152/80 | HR 78 | Ht 67.0 in | Wt 184.2 lb

## 2021-07-04 DIAGNOSIS — I480 Paroxysmal atrial fibrillation: Secondary | ICD-10-CM | POA: Insufficient documentation

## 2021-07-04 DIAGNOSIS — Z79899 Other long term (current) drug therapy: Secondary | ICD-10-CM | POA: Diagnosis not present

## 2021-07-04 DIAGNOSIS — Z7901 Long term (current) use of anticoagulants: Secondary | ICD-10-CM | POA: Insufficient documentation

## 2021-07-04 DIAGNOSIS — I10 Essential (primary) hypertension: Secondary | ICD-10-CM | POA: Insufficient documentation

## 2021-07-04 DIAGNOSIS — D6869 Other thrombophilia: Secondary | ICD-10-CM | POA: Diagnosis not present

## 2021-07-04 LAB — COMPREHENSIVE METABOLIC PANEL
ALT: 19 U/L (ref 0–44)
AST: 16 U/L (ref 15–41)
Albumin: 3.7 g/dL (ref 3.5–5.0)
Alkaline Phosphatase: 63 U/L (ref 38–126)
Anion gap: 8 (ref 5–15)
BUN: 11 mg/dL (ref 8–23)
CO2: 28 mmol/L (ref 22–32)
Calcium: 10 mg/dL (ref 8.9–10.3)
Chloride: 101 mmol/L (ref 98–111)
Creatinine, Ser: 0.92 mg/dL (ref 0.44–1.00)
GFR, Estimated: >60 mL/min (ref >60)
Glucose, Bld: 113 mg/dL — ABNORMAL HIGH (ref 70–99)
Potassium: 4.6 mmol/L (ref 3.5–5.1)
Sodium: 137 mmol/L (ref 135–145)
Total Bilirubin: 0.7 mg/dL (ref 0.3–1.2)
Total Protein: 6.4 g/dL — ABNORMAL LOW (ref 6.5–8.1)

## 2021-07-04 LAB — TSH: TSH: 1.22 u[IU]/mL (ref 0.350–4.500)

## 2021-07-04 LAB — CBC
HCT: 40.8 % (ref 36.0–46.0)
Hemoglobin: 13.1 g/dL (ref 12.0–15.0)
MCH: 28.4 pg (ref 26.0–34.0)
MCHC: 32.1 g/dL (ref 30.0–36.0)
MCV: 88.3 fL (ref 80.0–100.0)
Platelets: 293 10*3/uL (ref 150–400)
RBC: 4.62 MIL/uL (ref 3.87–5.11)
RDW: 13.7 % (ref 11.5–15.5)
WBC: 8.9 10*3/uL (ref 4.0–10.5)
nRBC: 0 % (ref 0.0–0.2)

## 2021-07-04 LAB — MAGNESIUM: Magnesium: 1.9 mg/dL (ref 1.7–2.4)

## 2021-07-04 NOTE — Progress Notes (Signed)
Primary Care Physician: Patient, [name] Referring Physician: Dr. Bonna Gains is a 87 y.o. female with a h/o paroxysmal afib that had afib since last Thursday. Dr. Johney Frame was notified and the pt was sent 30 mg Cardizem to take as needed. Shortly after starting the drug she resumed SR and is in SR today.  No trigger identified. She is on eliquis 5 mg bid with a CHA2DS2VASc  score of 4.     Today, she denies symptoms of palpitations, chest pain, shortness of breath, orthopnea, PND, lower extremity edema, dizziness, presyncope, syncope, or neurologic sequela. The patient is tolerating medications without difficulties and is otherwise without complaint today.   Past Medical History:  Diagnosis Date   Arthritis    Atrial fibrillation, transient (HCC)    post op hip surgery; May 2021   Diverticulitis    Dyspnea    with exertion    GERD (gastroesophageal reflux disease)    Hyperlipidemia    Hypertension    Stroke (HCC)    hx opf mini strokes    TIA (transient ischemic attack)    Past Surgical History:  Procedure Laterality Date   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/13/2020   Procedure: CONVERSION TO LEFT TOTAL HIP-POSTERIOR APPROACH;  Surgeon: Durene Romans, MD;  [facility]: [facility];  Service: Orthopedics;  Laterality: Left;  90 mins   HIP FRACTURE SURGERY Left 04/16/2020   KNEE SURGERY     WRIST SURGERY      Current Outpatient Medications  Medication Sig Dispense Refill   acetaminophen (TYLENOL) 500 MG tablet Take 2 tablets (1,000 mg total) by mouth every 8 (eight) hours. 30 tablet 0   albuterol (VENTOLIN HFA) 108 (90 Base) MCG/ACT inhaler Inhale 2 puffs into the lungs every 4 (four) hours as needed.     amLODipine (NORVASC) 10 MG tablet Take 10 mg by mouth every evening.      apixaban (ELIQUIS) 5 MG TABS tablet Take 1 tablet (5 mg total) by mouth 2 (two) times daily. 60 tablet 11   Ascorbic Acid (VITAMIN C) 1000 MG tablet Take 1,000 mg by mouth daily.      benazepril (LOTENSIN) 20 MG tablet Take 20 mg by mouth 2 (two) times daily.      calcium carbonate (TUMS - DOSED IN MG ELEMENTAL CALCIUM) 500 MG chewable tablet Chew 1-2 tablets by mouth 3 (three) times daily as needed for indigestion or heartburn.     cetirizine (ZYRTEC) 10 MG tablet Take 10 mg by mouth daily.      Cholecalciferol (VITAMIN D3) 125 MCG (5000 UT) TABS Take 5,000 Units by mouth daily.     diltiazem (CARDIZEM) 30 MG tablet Take 1-2 tablets (30-60 mg total) by mouth 4 (four) times daily as needed (for heart rates greater than 100 bpm during atrial fibrillation). 60 tablet 1   ferrous sulfate (FERROUSUL) 325 (65 FE) MG tablet Take 1 tablet (325 mg total) by mouth 3 (three) times daily with meals for 14 days. 42 tablet 0   gabapentin (NEURONTIN) 300 MG capsule Take 300 mg by mouth 3 (three) times daily.     labetalol (NORMODYNE) 200 MG tablet Take 200 mg by mouth 2 (two) times daily.     methocarbamol (ROBAXIN) 500 MG tablet Take 1 tablet (500 mg total) by mouth every 6 (six) hours as needed for muscle spasms. 40 tablet 0   Multiple Vitamin (MULTIVITAMIN WITH MINERALS) TABS tablet Take 1 tablet by mouth daily.  Multiple Vitamins-Minerals (PRESERVISION AREDS 2 PO) Take 1 tablet by mouth daily.     pantoprazole (PROTONIX) 40 MG tablet Take 40 mg by mouth daily.     Potassium Chloride (KCL-20 PO) Take 20 mEq by mouth daily with supper.      spironolactone (ALDACTONE) 25 MG tablet Take 25 mg by mouth daily.      sucralfate (CARAFATE) 1 g tablet Take 1 g by mouth 4 (four) times daily -  with meals and at bedtime.     temazepam (RESTORIL) 30 MG capsule Take 30 mg by mouth at bedtime.     vitamin B-12 (CYANOCOBALAMIN) 1000 MCG tablet Take 1,000 mcg by mouth daily.     Vitamin E 400 units TABS Take 400 Units by mouth daily.      No current facility-administered medications for this encounter.    Allergies  Allergen Reactions   Keflex [Cephalexin] Other (See Comments)    "makes pt feel  really bad"    Social History   Socioeconomic History   Marital status: Widowed    Spouse name: Not on file   Number of children: Not on file   Years of education: Not on file   Highest education level: Not on file  Occupational History   Not on file  Tobacco Use   Smoking status: Never   Smokeless tobacco: Never  Substance and Sexual Activity   Alcohol use: Never   Drug use: Never   Sexual activity: Not on file  Other Topics Concern   Not on file  Social History Narrative   Lives in [place] but currently staying with daughter in [place].   homemaker   Social Determinants of Health   Financial Resource Strain: Not on file  Food Insecurity: Not on file  Transportation Needs: Not on file  Physical Activity: Not on file  Stress: Not on file  Social Connections: Not on file  Intimate Partner Violence: Not on file    Family History  Problem Relation Age of Onset   Diabetes Mother     ROS- All systems are reviewed and negative except as per the HPI above  Physical Exam: Vitals:   07/04/21 1410  BP: (!) 152/80  Pulse: 78  Weight: 83.6 kg  Height: 5\' 7"  (1.702 m)   Wt Readings from Last 3 Encounters:  07/04/21 83.6 kg  02/27/21 81.4 kg  10/28/20 84.8 kg    Labs: Lab Results  Component Value Date   NA 136 09/15/2020   K 4.0 09/15/2020   CL 103 09/15/2020   CO2 26 09/15/2020   GLUCOSE 153 (H) 09/15/2020   BUN 15 09/15/2020   CREATININE 0.69 09/15/2020   CALCIUM 8.8 (L) 09/15/2020   No results found for: INR Lab Results  Component Value Date   CHOL 211 (H) 07/08/2020   HDL 38 (L) 07/08/2020   LDLCALC 137 (H) 07/08/2020   TRIG 198 (H) 07/08/2020     GEN- The patient is well appearing, alert and oriented x 3 today.   Head- normocephalic, atraumatic Eyes-  Sclera clear, conjunctiva pink Ears- hearing intact Oropharynx- clear Neck- supple, no JVP Lymph- no cervical lymphadenopathy Lungs- Clear to ausculation bilaterally, normal work of  breathing Heart- Regular rate and rhythm, no murmurs, rubs or gallops, PMI not laterally displaced GI- soft, NT, ND, + BS Extremities- no clubbing, cyanosis, or edema MS- no significant deformity or atrophy Skin- no rash or lesion Psych- euthymic mood, full affect Neuro- strength and sensation are intact  EKG-Vent.  rate 78 BPM PR interval 146 ms QRS duration 96 ms QT/QTcB 374/426 ms P-R-T axes 59 13 63 Normal sinus rhythm Normal ECG    Assessment and Plan:  1. Paroxsymal afib  Episode end of last week with restring SR with prn 30 mg cardizem Discussed that this can be used in the future if more afib episodes No trigger identified Will continue labetalol as ordered   labs cbc/cmet/tsh/mag today   2. HTN Elevated today  3. CHA2DS2VASc  score of  4 SIL states at home BP 130-140 range    F/u with Dr. Johney Frame as scheduled Afib clinic as needed   Lupita Leash [facility] [address] [place], [place] [address] [phone]

## 2021-08-09 ENCOUNTER — Other Ambulatory Visit: Payer: Self-pay | Admitting: Internal Medicine

## 2021-08-10 NOTE — Telephone Encounter (Signed)
Eliquis 5mg  refill request received. Patient is 87 years old, weight-83.6kg, Crea-0.92 on 07/04/2021, Diagnosis-Afib, and last seen by 07/06/2021 on 07/04/2021. Dose is appropriate based on dosing criteria. Will send in refill to requested pharmacy.

## 2022-02-24 ENCOUNTER — Other Ambulatory Visit: Payer: Self-pay | Admitting: Internal Medicine

## 2022-02-26 NOTE — Telephone Encounter (Signed)
Eliquis 5 mg refill request received. Patient is 88 years old, weight- 83.6 kg, Crea- 0.92 on 07/04/21, Diagnosis- PAF, and last seen by Dr. Rayann Heman on 02/27/21. Dose is appropriate based on dosing criteria. Pt is overdue for appt w/ Dr. Rayann Heman, will send note to scheduling and send in 30 day refill to requested pharmacy.   ?

## 2022-03-09 ENCOUNTER — Telehealth: Payer: Self-pay | Admitting: Internal Medicine

## 2022-03-09 DIAGNOSIS — I480 Paroxysmal atrial fibrillation: Secondary | ICD-10-CM

## 2022-03-09 MED ORDER — APIXABAN 5 MG PO TABS: 5.0000 mg | ORAL_TABLET | Freq: Two times a day (BID) | ORAL | 1 refills | Status: DC

## 2022-03-09 NOTE — Telephone Encounter (Signed)
?*  STAT* If patient is at the pharmacy, call can be transferred to refill team. ? ? ?1. Which medications need to be refilled? (please list name of each medication and dose if known)  ?apixaban (ELIQUIS) 5 MG TABS tablet ? ?2. Which pharmacy/location (including street and city if local pharmacy) is medication to be sent to? ?[id number] - , [place] - [address] ? ?3. Do they need a 30 day or 90 day supply? 90 with refills ? ?Patient is scheduled to see Dr. Johney Frame 04/11/22 but will not have enough medication to last until her appointment  ?

## 2022-03-09 NOTE — Telephone Encounter (Signed)
Prescription refill request for Eliquis received. ?Indication:Afib  ?Last office visit: 07/04/21 Noralyn Pick) ?Scr:  0.79 (02/27/22) ?Age: 88 ?Weight: 83.6kg ? ?Appropriate dose and refill sent to requested pharmacy.  ? ? ?

## 2022-04-11 ENCOUNTER — Ambulatory Visit (INDEPENDENT_AMBULATORY_CARE_PROVIDER_SITE_OTHER): Payer: Medicare Other | Admitting: Internal Medicine

## 2022-04-11 ENCOUNTER — Encounter: Payer: Self-pay | Admitting: Internal Medicine

## 2022-04-11 VITALS — BP 138/72 | HR 71 | Ht 64.0 in | Wt 193.0 lb

## 2022-04-11 DIAGNOSIS — D6869 Other thrombophilia: Secondary | ICD-10-CM

## 2022-04-11 DIAGNOSIS — I480 Paroxysmal atrial fibrillation: Secondary | ICD-10-CM | POA: Diagnosis not present

## 2022-04-11 DIAGNOSIS — I10 Essential (primary) hypertension: Secondary | ICD-10-CM

## 2022-04-11 NOTE — Progress Notes (Signed)
PCP: Patient, [name]   Primary EP: Dr Meriam Sprague is a 88 y.o. female who presents today for routine electrophysiology followup.  Since last being seen in our clinic, the patient reports doing very well.  Today, she denies symptoms of palpitations, chest pain, shortness of breath,  lower extremity edema, dizziness, presyncope, or syncope.  The patient is otherwise without complaint today.   Past Medical History:  Diagnosis Date   Arthritis    Atrial fibrillation, transient ([place])    post op hip surgery; May 2021   Diverticulitis    Dyspnea    with exertion    GERD (gastroesophageal reflux disease)    Hyperlipidemia    Hypertension    Stroke ([place])    hx opf mini strokes    TIA (transient ischemic attack)    Past Surgical History:  Procedure Laterality Date   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/13/2020   Procedure: CONVERSION TO LEFT TOTAL HIP-POSTERIOR APPROACH;  Surgeon: Paralee Cancel, MD;  Location: [facility];  Service: Orthopedics;  Laterality: Left;  90 mins   HIP FRACTURE SURGERY Left 04/16/2020   KNEE SURGERY     WRIST SURGERY      ROS- all systems are reviewed and negatives except as per HPI above  Current Outpatient Medications  Medication Sig Dispense Refill   acetaminophen (TYLENOL) 500 MG tablet Take 2 tablets (1,000 mg total) by mouth every 8 (eight) hours. 30 tablet 0   albuterol (VENTOLIN HFA) 108 (90 Base) MCG/ACT inhaler Inhale 2 puffs into the lungs every 4 (four) hours as needed.     amLODipine (NORVASC) 10 MG tablet Take 10 mg by mouth every evening.      apixaban (ELIQUIS) 5 MG TABS tablet Take 1 tablet (5 mg total) by mouth 2 (two) times daily. 180 tablet 1   Ascorbic Acid (VITAMIN C) 1000 MG tablet Take 1,000 mg by mouth daily.     benazepril (LOTENSIN) 20 MG tablet Take 20 mg by mouth 2 (two) times daily.      calcium carbonate (TUMS - DOSED IN MG ELEMENTAL CALCIUM) 500 MG chewable tablet Chew 1-2 tablets by mouth 3 (three) times  daily as needed for indigestion or heartburn.     cetirizine (ZYRTEC) 10 MG tablet Take 10 mg by mouth daily.      Cholecalciferol (VITAMIN D3) 125 MCG (5000 UT) TABS Take 5,000 Units by mouth daily.     diltiazem (CARDIZEM) 30 MG tablet Take 1-2 tablets (30-60 mg total) by mouth 4 (four) times daily as needed (for heart rates greater than 100 bpm during atrial fibrillation). 60 tablet 1   gabapentin (NEURONTIN) 300 MG capsule Take 300 mg by mouth 3 (three) times daily.     labetalol (NORMODYNE) 200 MG tablet Take 200 mg by mouth 2 (two) times daily.     methocarbamol (ROBAXIN) 500 MG tablet Take 1 tablet (500 mg total) by mouth every 6 (six) hours as needed for muscle spasms. 40 tablet 0   Multiple Vitamin (MULTIVITAMIN WITH MINERALS) TABS tablet Take 1 tablet by mouth daily.     Multiple Vitamins-Minerals (PRESERVISION AREDS 2 PO) Take 1 tablet by mouth daily.     pantoprazole (PROTONIX) 40 MG tablet Take 40 mg by mouth daily.     Potassium Chloride (KCL-20 PO) Take 20 mEq by mouth daily with supper.      spironolactone (ALDACTONE) 25 MG tablet Take 25 mg by mouth daily.  sucralfate (CARAFATE) 1 g tablet Take 1 g by mouth 4 (four) times daily -  with meals and at bedtime.     vitamin B-12 (CYANOCOBALAMIN) 1000 MCG tablet Take 1,000 mcg by mouth daily.     Vitamin E 400 units TABS Take 400 Units by mouth daily.      benazepril (LOTENSIN) 20 MG tablet Take 1 tablet by mouth daily.     cyanocobalamin 1000 MCG tablet Take 1 tablet by mouth every other day.     ferrous sulfate (FERROUSUL) 325 (65 FE) MG tablet Take 1 tablet (325 mg total) by mouth 3 (three) times daily with meals for 14 days. 42 tablet 0   methocarbamol (ROBAXIN) 750 MG tablet Take 750 mg by mouth 3 (three) times daily as needed.     mirtazapine (REMERON) 15 MG tablet Take 15 mg by mouth at bedtime.     No current facility-administered medications for this visit.    Physical Exam: Vitals:   04/11/22 1604  BP: 138/72   Pulse: 71  SpO2: 92%  Weight: 193 lb (87.5 kg)  Height: 5\' 4"  (1.626 m)    GEN- The patient is well appearing, alert and oriented x 3 today.   Head- normocephalic, atraumatic Eyes-  Sclera clear, conjunctiva pink Ears- hearing intact Oropharynx- clear Lungs- Clear to ausculation bilaterally, normal work of breathing Heart- Regular rate and rhythm, no murmurs, rubs or gallops, PMI not laterally displaced GI- soft, NT, ND, + BS Extremities- no clubbing, cyanosis, or edema  Wt Readings from Last 3 Encounters:  04/11/22 193 lb (87.5 kg)  07/04/21 184 lb 3.2 oz (83.6 kg)  02/27/21 179 lb 6.4 oz (81.4 kg)    EKG tracing ordered today is personally reviewed and shows sinus  Assessment and Plan:  Paroxysmal atrial fibrillation Well controlled She is on eliquis Labs 02/27/22 reviewed with her today  2. HTN Stable No change required today  3. HL Stable No change required today   Risks, benefits and potential toxicities for medications prescribed and/or refilled reviewed with patient today.   Follow-up in AF clinic in 6 months  Thompson Grayer MD, [facility] 04/11/2022 4:09 PM

## 2022-04-11 NOTE — Patient Instructions (Signed)
Medication Instructions:  Your physician recommends that you continue on your current medications as directed. Please refer to the Current Medication list given to you today.  *If you need a refill on your cardiac medications before your next appointment, please call your pharmacy*   Lab Work: None ordered If you have labs (blood work) drawn today and your tests are completely normal, you will receive your results only by: MyChart Message (if you have MyChart) OR A paper copy in the mail If you have any lab test that is abnormal or we need to change your treatment, we will call you to review the results.   Testing/Procedures: None ordered   Follow-Up: At [facility], you and your health needs are our priority.  As part of our continuing mission to provide you with exceptional heart care, we have created designated Provider Care Teams.  These Care Teams include your primary Cardiologist (physician) and Advanced Practice Providers (APPs -  Physician Assistants and Nurse Practitioners) who all work together to provide you with the care you need, when you need it.  We recommend signing up for the patient portal called "MyChart".  Sign up information is provided on this After Visit Summary.  MyChart is used to connect with patients for Virtual Visits (Telemedicine).  Patients are able to view lab/test results, encounter notes, upcoming appointments, etc.  Non-urgent messages can be sent to your provider as well.   To learn more about what you can do with MyChart, go to [email or web address].    Your next appointment:   6 month(s)  The format for your next appointment:   In Person  Provider:   You will follow up in the Atrial Fibrillation Clinic located at [facility]. Your provider will be: Rudi Coco, NP or Clint R. Fenton, PA-C   Thank you for choosing [facility]!!   Nedra Hai, RN [phone]  Other Instructions   Important Information About  [place]

## 2022-07-16 ENCOUNTER — Other Ambulatory Visit: Payer: Self-pay | Admitting: Internal Medicine

## 2022-07-16 DIAGNOSIS — I480 Paroxysmal atrial fibrillation: Secondary | ICD-10-CM

## 2022-07-16 NOTE — Telephone Encounter (Signed)
Prescription refill request for Eliquis received. Indication: PAF Last office visit: 04/11/22  Shela Commons Allred MD Scr: 0.79 on 02/27/22 Age: 88 Weight: 87.5kg  Based on above findings Eliquis 5mg  twice daily is the appropriate dose.  Refill approved.

## 2022-09-26 ENCOUNTER — Emergency Department (HOSPITAL_COMMUNITY): Payer: Medicare Other

## 2022-09-26 ENCOUNTER — Other Ambulatory Visit: Payer: Self-pay

## 2022-09-26 ENCOUNTER — Inpatient Hospital Stay (HOSPITAL_COMMUNITY)
Admission: EM | Admit: 2022-09-26 | Discharge: 2022-10-02 | DRG: 177 | Disposition: A | Discharge status: Home Health | Payer: Medicare Other | Attending: Family Medicine | Admitting: Family Medicine

## 2022-09-26 ENCOUNTER — Encounter (HOSPITAL_COMMUNITY): Payer: Self-pay

## 2022-09-26 DIAGNOSIS — I5032 Chronic diastolic (congestive) heart failure: Secondary | ICD-10-CM | POA: Diagnosis present

## 2022-09-26 DIAGNOSIS — Z8673 Personal history of transient ischemic attack (TIA), and cerebral infarction without residual deficits: Secondary | ICD-10-CM | POA: Diagnosis not present

## 2022-09-26 DIAGNOSIS — Z96642 Presence of left artificial hip joint: Secondary | ICD-10-CM | POA: Diagnosis present

## 2022-09-26 DIAGNOSIS — Z881 Allergy status to other antibiotic agents status: Secondary | ICD-10-CM | POA: Diagnosis not present

## 2022-09-26 DIAGNOSIS — Z833 Family history of diabetes mellitus: Secondary | ICD-10-CM

## 2022-09-26 DIAGNOSIS — I480 Paroxysmal atrial fibrillation: Secondary | ICD-10-CM | POA: Diagnosis present

## 2022-09-26 DIAGNOSIS — Z7901 Long term (current) use of anticoagulants: Secondary | ICD-10-CM

## 2022-09-26 DIAGNOSIS — I110 Hypertensive heart disease with heart failure: Secondary | ICD-10-CM | POA: Diagnosis present

## 2022-09-26 DIAGNOSIS — F329 Major depressive disorder, single episode, unspecified: Secondary | ICD-10-CM | POA: Diagnosis present

## 2022-09-26 DIAGNOSIS — Z79899 Other long term (current) drug therapy: Secondary | ICD-10-CM

## 2022-09-26 DIAGNOSIS — E785 Hyperlipidemia, unspecified: Secondary | ICD-10-CM | POA: Diagnosis present

## 2022-09-26 DIAGNOSIS — M1990 Unspecified osteoarthritis, unspecified site: Secondary | ICD-10-CM | POA: Diagnosis present

## 2022-09-26 DIAGNOSIS — R0609 Other forms of dyspnea: Secondary | ICD-10-CM | POA: Diagnosis not present

## 2022-09-26 DIAGNOSIS — R112 Nausea with vomiting, unspecified: Secondary | ICD-10-CM | POA: Diagnosis present

## 2022-09-26 DIAGNOSIS — J1282 Pneumonia due to coronavirus disease 2019: Secondary | ICD-10-CM | POA: Diagnosis present

## 2022-09-26 DIAGNOSIS — J9601 Acute respiratory failure with hypoxia: Secondary | ICD-10-CM | POA: Diagnosis present

## 2022-09-26 DIAGNOSIS — U071 COVID-19: Secondary | ICD-10-CM | POA: Diagnosis present

## 2022-09-26 DIAGNOSIS — K219 Gastro-esophageal reflux disease without esophagitis: Secondary | ICD-10-CM | POA: Diagnosis present

## 2022-09-26 DIAGNOSIS — J440 Chronic obstructive pulmonary disease with acute lower respiratory infection: Secondary | ICD-10-CM | POA: Diagnosis present

## 2022-09-26 DIAGNOSIS — E663 Overweight: Secondary | ICD-10-CM | POA: Diagnosis present

## 2022-09-26 DIAGNOSIS — R0902 Hypoxemia: Secondary | ICD-10-CM

## 2022-09-26 LAB — CBC WITH DIFFERENTIAL/PLATELET
Abs Immature Granulocytes: 0.02 10*3/uL (ref 0.00–0.07)
Basophils Absolute: 0 10*3/uL (ref 0.0–0.1)
Basophils Relative: 0 %
Eosinophils Absolute: 0 10*3/uL (ref 0.0–0.5)
Eosinophils Relative: 0 %
HCT: 38.5 % (ref 36.0–46.0)
Hemoglobin: 12.1 g/dL (ref 12.0–15.0)
Immature Granulocytes: 0 %
Lymphocytes Relative: 8 %
Lymphs Abs: 0.7 10*3/uL (ref 0.7–4.0)
MCH: 27.6 pg (ref 26.0–34.0)
MCHC: 31.4 g/dL (ref 30.0–36.0)
MCV: 87.9 fL (ref 80.0–100.0)
Monocytes Absolute: 0.7 10*3/uL (ref 0.1–1.0)
Monocytes Relative: 8 %
Neutro Abs: 7.4 10*3/uL (ref 1.7–7.7)
Neutrophils Relative %: 84 %
Platelets: 174 10*3/uL (ref 150–400)
RBC: 4.38 MIL/uL (ref 3.87–5.11)
RDW: 13.9 % (ref 11.5–15.5)
WBC: 8.8 10*3/uL (ref 4.0–10.5)
nRBC: 0 % (ref 0.0–0.2)

## 2022-09-26 LAB — COMPREHENSIVE METABOLIC PANEL
ALT: 21 U/L (ref 0–44)
AST: 22 U/L (ref 15–41)
Albumin: 3.5 g/dL (ref 3.5–5.0)
Alkaline Phosphatase: 63 U/L (ref 38–126)
Anion gap: 4 — ABNORMAL LOW (ref 5–15)
BUN: 12 mg/dL (ref 8–23)
CO2: 24 mmol/L (ref 22–32)
Calcium: 8.6 mg/dL — ABNORMAL LOW (ref 8.9–10.3)
Chloride: 108 mmol/L (ref 98–111)
Creatinine, Ser: 0.72 mg/dL (ref 0.44–1.00)
GFR, Estimated: >60 mL/min (ref >60)
Glucose, Bld: 117 mg/dL — ABNORMAL HIGH (ref 70–99)
Potassium: 4 mmol/L (ref 3.5–5.1)
Sodium: 136 mmol/L (ref 135–145)
Total Bilirubin: 0.5 mg/dL (ref 0.3–1.2)
Total Protein: 6.4 g/dL — ABNORMAL LOW (ref 6.5–8.1)

## 2022-09-26 LAB — RESP PANEL BY RT-PCR (FLU A&B, COVID) ARPGX2
Influenza A by PCR: NEGATIVE
Influenza B by PCR: NEGATIVE
SARS Coronavirus 2 by RT PCR: POSITIVE — AB

## 2022-09-26 LAB — LIPASE, BLOOD: Lipase: 35 U/L (ref 11–51)

## 2022-09-26 MED ORDER — ONDANSETRON HCL 4 MG/2ML IJ SOLN
4.0000 mg | Freq: Once | INTRAMUSCULAR | Status: AC
  Administered 2022-09-26: 4 mg via INTRAVENOUS
  Filled 2022-09-26: qty 2

## 2022-09-26 MED ORDER — ACETAMINOPHEN 500 MG PO TABS
1000.0000 mg | ORAL_TABLET | Freq: Three times a day (TID) | ORAL | Status: DC
  Administered 2022-09-26 – 2022-10-02 (×18): 1000 mg via ORAL
  Filled 2022-09-26 (×19): qty 2

## 2022-09-26 MED ORDER — DEXAMETHASONE 4 MG PO TABS
6.0000 mg | ORAL_TABLET | Freq: Every day | ORAL | Status: DC
  Administered 2022-09-26 – 2022-10-02 (×7): 6 mg via ORAL
  Filled 2022-09-26 (×7): qty 1

## 2022-09-26 MED ORDER — DILTIAZEM HCL 30 MG PO TABS: 30.0000 mg | ORAL_TABLET | Freq: Four times a day (QID) | ORAL | Status: DC | PRN

## 2022-09-26 MED ORDER — SPIRONOLACTONE 25 MG PO TABS
25.0000 mg | ORAL_TABLET | Freq: Every day | ORAL | Status: DC
  Administered 2022-09-27 – 2022-10-02 (×6): 25 mg via ORAL
  Filled 2022-09-26 (×6): qty 1

## 2022-09-26 MED ORDER — GABAPENTIN 300 MG PO CAPS
600.0000 mg | ORAL_CAPSULE | Freq: Every day | ORAL | Status: DC
  Administered 2022-09-27 – 2022-10-01 (×5): 600 mg via ORAL
  Filled 2022-09-26 (×5): qty 2

## 2022-09-26 MED ORDER — ALBUTEROL SULFATE HFA 108 (90 BASE) MCG/ACT IN AERS
INHALATION_SPRAY | RESPIRATORY_TRACT | Status: AC
  Filled 2022-09-26: qty 6.7

## 2022-09-26 MED ORDER — APIXABAN 5 MG PO TABS
5.0000 mg | ORAL_TABLET | Freq: Two times a day (BID) | ORAL | Status: DC
  Administered 2022-09-26 – 2022-10-02 (×12): 5 mg via ORAL
  Filled 2022-09-26 (×12): qty 1

## 2022-09-26 MED ORDER — SODIUM CHLORIDE 0.9 % IV SOLN: INTRAVENOUS | Status: DC

## 2022-09-26 MED ORDER — MIRTAZAPINE 15 MG PO TABS
15.0000 mg | ORAL_TABLET | Freq: Every day | ORAL | Status: DC
  Administered 2022-09-26 – 2022-10-01 (×6): 15 mg via ORAL
  Filled 2022-09-26: qty 1
  Filled 2022-09-26: qty 2
  Filled 2022-09-26 (×4): qty 1

## 2022-09-26 MED ORDER — DOCUSATE SODIUM 100 MG PO CAPS
100.0000 mg | ORAL_CAPSULE | Freq: Two times a day (BID) | ORAL | Status: DC
  Administered 2022-09-26 – 2022-10-01 (×11): 100 mg via ORAL
  Filled 2022-09-26 (×13): qty 1

## 2022-09-26 MED ORDER — MOLNUPIRAVIR EUA 200MG CAPSULE: 4.0000 | ORAL_CAPSULE | Freq: Two times a day (BID) | ORAL | Status: DC

## 2022-09-26 MED ORDER — ALBUTEROL SULFATE HFA 108 (90 BASE) MCG/ACT IN AERS
2.0000 | INHALATION_SPRAY | RESPIRATORY_TRACT | Status: DC | PRN
  Administered 2022-09-30 – 2022-10-01 (×7): 2 via RESPIRATORY_TRACT
  Filled 2022-09-26: qty 6.7

## 2022-09-26 MED ORDER — VITAMIN C 500 MG PO TABS
1000.0000 mg | ORAL_TABLET | Freq: Every day | ORAL | Status: DC
  Administered 2022-09-27 – 2022-10-02 (×6): 1000 mg via ORAL
  Filled 2022-09-26 (×6): qty 2

## 2022-09-26 MED ORDER — GABAPENTIN 300 MG PO CAPS
300.0000 mg | ORAL_CAPSULE | Freq: Once | ORAL | Status: DC
  Filled 2022-09-26: qty 1

## 2022-09-26 MED ORDER — ONDANSETRON HCL 4 MG PO TABS: 4.0000 mg | ORAL_TABLET | Freq: Four times a day (QID) | ORAL | Status: DC | PRN

## 2022-09-26 MED ORDER — GUAIFENESIN-DM 100-10 MG/5ML PO SYRP
10.0000 mL | ORAL_SOLUTION | ORAL | Status: DC | PRN
  Administered 2022-09-26 – 2022-10-01 (×15): 10 mL via ORAL
  Filled 2022-09-26 (×15): qty 10

## 2022-09-26 MED ORDER — FUROSEMIDE 10 MG/ML IJ SOLN: 40.0000 mg | Freq: Once | INTRAMUSCULAR | Status: AC

## 2022-09-26 MED ORDER — HYDROCOD POLI-CHLORPHE POLI ER 10-8 MG/5ML PO SUER
5.0000 mL | Freq: Two times a day (BID) | ORAL | Status: DC | PRN
  Administered 2022-09-27 – 2022-09-30 (×5): 5 mL via ORAL
  Filled 2022-09-26 (×5): qty 5

## 2022-09-26 MED ORDER — LABETALOL HCL 200 MG PO TABS
200.0000 mg | ORAL_TABLET | Freq: Two times a day (BID) | ORAL | Status: DC
  Administered 2022-09-26: 200 mg via ORAL
  Filled 2022-09-26 (×2): qty 1

## 2022-09-26 MED ORDER — PANTOPRAZOLE SODIUM 40 MG PO TBEC
40.0000 mg | DELAYED_RELEASE_TABLET | Freq: Every day | ORAL | Status: DC
  Administered 2022-09-26 – 2022-10-02 (×7): 40 mg via ORAL
  Filled 2022-09-26 (×7): qty 1

## 2022-09-26 MED ORDER — GABAPENTIN 300 MG PO CAPS
300.0000 mg | ORAL_CAPSULE | Freq: Every day | ORAL | Status: DC
  Administered 2022-09-27 – 2022-10-02 (×6): 300 mg via ORAL
  Filled 2022-09-26 (×6): qty 1

## 2022-09-26 MED ORDER — VITAMIN B-12 1000 MCG PO TABS
1000.0000 ug | ORAL_TABLET | ORAL | Status: DC
  Administered 2022-09-28 – 2022-10-02 (×3): 1000 ug via ORAL
  Filled 2022-09-26 (×3): qty 1

## 2022-09-26 MED ORDER — FUROSEMIDE 10 MG/ML IJ SOLN
INTRAMUSCULAR | Status: AC
  Administered 2022-09-26: 40 mg via INTRAVENOUS
  Filled 2022-09-26: qty 4

## 2022-09-26 MED ORDER — VITAMIN D 25 MCG (1000 UNIT) PO TABS
5000.0000 [IU] | ORAL_TABLET | Freq: Every day | ORAL | Status: DC
  Administered 2022-09-27 – 2022-10-02 (×6): 5000 [IU] via ORAL
  Filled 2022-09-26 (×6): qty 5

## 2022-09-26 MED ORDER — AMLODIPINE BESYLATE 10 MG PO TABS
10.0000 mg | ORAL_TABLET | Freq: Every evening | ORAL | Status: DC
  Administered 2022-09-26 – 2022-10-01 (×6): 10 mg via ORAL
  Filled 2022-09-26: qty 1
  Filled 2022-09-26: qty 2
  Filled 2022-09-26 (×4): qty 1

## 2022-09-26 MED ORDER — GABAPENTIN 300 MG PO CAPS
300.0000 mg | ORAL_CAPSULE | Freq: Three times a day (TID) | ORAL | Status: DC
  Filled 2022-09-26: qty 1

## 2022-09-26 MED ORDER — MOLNUPIRAVIR EUA 200MG CAPSULE
4.0000 | ORAL_CAPSULE | Freq: Two times a day (BID) | ORAL | Status: AC
  Administered 2022-09-27 – 2022-10-01 (×10): 800 mg via ORAL
  Filled 2022-09-26 (×2): qty 4

## 2022-09-26 MED ORDER — ONDANSETRON HCL 4 MG/2ML IJ SOLN: 4.0000 mg | Freq: Four times a day (QID) | INTRAMUSCULAR | Status: DC | PRN

## 2022-09-26 MED ORDER — NIRMATRELVIR/RITONAVIR (PAXLOVID) TABLET (RENAL DOSING): 2.0000 | ORAL_TABLET | Freq: Two times a day (BID) | ORAL | Status: DC

## 2022-09-26 MED ORDER — METHOCARBAMOL 500 MG PO TABS
750.0000 mg | ORAL_TABLET | Freq: Three times a day (TID) | ORAL | Status: DC | PRN
  Administered 2022-09-30: 750 mg via ORAL
  Filled 2022-09-26: qty 2

## 2022-09-26 NOTE — H&P (Signed)
History and Physical    [name] YWV:[id number] DOB: 03-17-1933 DOA: 09/26/2022  PCP: Hadley Pen, MD   Patient coming from: Home  I have personally briefly reviewed patient's old medical records in [facility]  Chief Complaint: Cough, Chest congestion, fatigue, bodyache X 3 days.  HPI: [name] is a 88 y.o. female with PMH significant for COPD , Not on home oxygen, atrial fibrillation on Eliquis, arthritis, hypertension, hyperlipidemia, GERD presented in the ED with non productive cough associated with chest congestion and body ache for last 2 to 3 days.  Patient describes cough is dry, associated with chest pain and congestion. She denies any fever, chills, sick contacts, recent travel.  Patient has COPD but does not use oxygen at baseline.She reports has been using inhalers without any improvement.  She also reports generalized body ache, feeling short of breath even with mild exertion.  She also reports nausea and vomiting.  She is up-to-date with COVID vaccination including booster.  ED Course: She was hemodynamically stable except tachypnea and hypoxia. Temp 100.2, RR 20, HR 77, BP 165/67, SPO2 87% on room air requiring 2 L of supplemental oxygen. Labs include sodium 136, potassium 4.0, chloride 108, bicarb 24, glucose 117, BUN 12, creatinine 0.72, calcium 8.6, anion gap 4, albumin 3.5, lipase 35, AST 22, ALT 21, total protein 6.4, WBC 8.8, hemoglobin 12.1, hematocrit 38.5, platelet 174, COVID+,  Chest x-ray evidence of chronic lung disease.   Review of Systems: Review of Systems  Constitutional:  Positive for malaise/fatigue. Negative for fever.  HENT:  Positive for congestion.   Eyes: Negative.   Respiratory:  Positive for cough and shortness of breath.   Gastrointestinal:  Positive for nausea and vomiting.  Genitourinary: Negative.   Musculoskeletal:  Positive for back pain and myalgias.  Skin: Negative.   Neurological:  Positive for weakness.   Endo/Heme/Allergies: Negative.   Psychiatric/Behavioral:  Positive for depression.      Past Medical History:  Diagnosis Date   Arthritis    Atrial fibrillation, transient (HCC)    post op hip surgery; May 2021   Diverticulitis    Dyspnea    with exertion    GERD (gastroesophageal reflux disease)    Hyperlipidemia    Hypertension    Stroke (HCC)    hx opf mini strokes    TIA (transient ischemic attack)     Past Surgical History:  Procedure Laterality Date   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/13/2020   Procedure: CONVERSION TO LEFT TOTAL HIP-POSTERIOR APPROACH;  Surgeon: Durene Romans, MD;  Location: [facility];  Service: Orthopedics;  Laterality: Left;  90 mins   HIP FRACTURE SURGERY Left 04/16/2020   KNEE SURGERY     WRIST SURGERY       reports that she has never smoked. She has never used smokeless tobacco. She reports that she does not drink alcohol and does not use drugs.  Allergies  Allergen Reactions   Keflex [Cephalexin] Other (See Comments)    "makes pt feel really bad"    Family History  Problem Relation Age of Onset   Diabetes Mother     Family history reviewed and not pertinent.  Prior to Admission medications   Medication Sig Start Date End Date Taking? Authorizing Provider  acetaminophen (TYLENOL) 500 MG tablet Take 2 tablets (1,000 mg total) by mouth every 8 (eight) hours. 09/16/20   Lanney Gins, PA-C  albuterol (VENTOLIN HFA) 108 (90 Base) MCG/ACT inhaler Inhale 2 puffs into the lungs every  4 (four) hours as needed. 06/08/21   [provider]  amLODipine (NORVASC) 10 MG tablet Take 10 mg by mouth every evening.     [provider]  Ascorbic Acid (VITAMIN C) 1000 MG tablet Take 1,000 mg by mouth daily.    [provider]  benazepril (LOTENSIN) 20 MG tablet Take 1 tablet by mouth daily.    [provider]  calcium carbonate (TUMS - DOSED IN MG ELEMENTAL CALCIUM) 500 MG chewable tablet Chew 1-2 tablets by mouth 3 (three)  times daily as needed for indigestion or heartburn.    [provider]  cetirizine (ZYRTEC) 10 MG tablet Take 10 mg by mouth daily.     [provider]  Cholecalciferol (VITAMIN D3) 125 MCG (5000 UT) TABS Take 5,000 Units by mouth daily.    [provider]  cyanocobalamin 1000 MCG tablet Take 1 tablet by mouth every other day.    [provider]  diltiazem (CARDIZEM) 30 MG tablet Take 1-2 tablets (30-60 mg total) by mouth 4 (four) times daily as needed (for heart rates greater than 100 bpm during atrial fibrillation). 06/30/21   Allred, Jeneen Rinks, MD  ELIQUIS 5 MG TABS tablet TAKE 1 TABLET BY MOUTH TWICE A DAY 07/16/22   Allred, Jeneen Rinks, MD  ferrous sulfate (FERROUSUL) 325 (65 FE) MG tablet Take 1 tablet (325 mg total) by mouth 3 (three) times daily with meals for 14 days. 09/16/20 07/04/21  Danae Orleans, PA-C  gabapentin (NEURONTIN) 300 MG capsule Take 300 mg by mouth 3 (three) times daily.    [provider]  labetalol (NORMODYNE) 200 MG tablet Take 200 mg by mouth 2 (two) times daily.    [provider]  methocarbamol (ROBAXIN) 750 MG tablet Take 750 mg by mouth 3 (three) times daily as needed. 03/06/22   [provider]  mirtazapine (REMERON) 15 MG tablet Take 15 mg by mouth at bedtime. 02/27/22   [provider]  Multiple Vitamin (MULTIVITAMIN WITH MINERALS) TABS tablet Take 1 tablet by mouth daily.    [provider]  Multiple Vitamins-Minerals (PRESERVISION AREDS 2 PO) Take 1 tablet by mouth daily.    [provider]  pantoprazole (PROTONIX) 40 MG tablet Take 40 mg by mouth daily. 05/13/21   [provider]  Potassium Chloride (KCL-20 PO) Take 20 mEq by mouth daily with supper.     [provider]  spironolactone (ALDACTONE) 25 MG tablet Take 25 mg by mouth daily.     [provider]  sucralfate (CARAFATE) 1 g tablet Take 1 g by mouth 4 (four) times daily -  with meals and at bedtime.     [provider]  Vitamin E 400 units TABS Take 400 Units by mouth daily.     [provider]    Physical Exam: Vitals:   09/26/22 1438 09/26/22 1445 09/26/22 1500 09/26/22 1503  BP:  135/64 123/68   Pulse: 77 71 70   Resp: (!) 21 (!) 24 16   Temp:      TempSrc:      SpO2: (!) 87% 95% 95% 95%  Weight:      Height:        Constitutional: Appears comfortable, not in any acute distress.  Deconditioned Vitals:   09/26/22 1438 09/26/22 1445 09/26/22 1500 09/26/22 1503  BP:  135/64 123/68   Pulse: 77 71 70   Resp: (!) 21 (!) 24 16   Temp:      TempSrc:  SpO2: (!) 87% 95% 95% 95%  Weight:      Height:       Eyes: PERRL, lids and conjunctivae normal ENMT: Mucous membranes are moist, posterior pharynx without any exudate. Neck: normal, supple, no masses, no thyromegaly Respiratory: Decreased breath sounds, no wheezing, no crackles, normal respiratory effort. Cardiovascular: Regular rate and rhythm, no murmur.  S1-S2 heard Abdomen: Soft, non tender, non distended, BS+ Musculoskeletal: No edema, no clubbing / cyanosis.  Good ROM, no contractures. Normal muscle tone.  Skin: no rashes, lesions, ulcers. No induration Neurologic: CN 2-12 grossly intact. Sensation intact, DTR normal. Strength 5/5 in all 4.  Psychiatric: Normal judgment and insight. Alert and oriented x 3. Normal mood.     Labs on Admission: I have personally reviewed following labs and imaging studies  CBC: Recent Labs  Lab 09/26/22 0947  WBC 8.8  NEUTROABS 7.4  HGB 12.1  HCT 38.5  MCV 87.9  PLT [id number]   Basic Metabolic Panel: Recent Labs  Lab 09/26/22 0947  NA 136  K 4.0  CL 108  CO2 24  GLUCOSE 117*  BUN 12  CREATININE 0.72  CALCIUM 8.6*   GFR: Estimated Creatinine Clearance: 52.9 mL/min (by C-G formula based on SCr of 0.72 mg/dL). Liver Function Tests: Recent Labs  Lab 09/26/22 0947  AST 22  ALT 21  ALKPHOS 63  BILITOT 0.5  PROT 6.4*  ALBUMIN 3.5   Recent Labs   Lab 09/26/22 0947  LIPASE 35   No results for input(s): "AMMONIA" in the last 168 hours. Coagulation Profile: No results for input(s): "INR", "PROTIME" in the last 168 hours. Cardiac Enzymes: No results for input(s): "CKTOTAL", "CKMB", "CKMBINDEX", "TROPONINI" in the last 168 hours. BNP (last 3 results) No results for input(s): "PROBNP" in the last 8760 hours. HbA1C: No results for input(s): "HGBA1C" in the last 72 hours. CBG: No results for input(s): "GLUCAP" in the last 168 hours. Lipid Profile: No results for input(s): "CHOL", "HDL", "LDLCALC", "TRIG", "CHOLHDL", "LDLDIRECT" in the last 72 hours. Thyroid Function Tests: No results for input(s): "TSH", "T4TOTAL", "FREET4", "T3FREE", "THYROIDAB" in the last 72 hours. Anemia Panel: No results for input(s): "VITAMINB12", "FOLATE", "FERRITIN", "TIBC", "IRON", "RETICCTPCT" in the last 72 hours. Urine analysis:    Component Value Date/Time   COLORURINE YELLOW 09/17/2020 1145   APPEARANCEUR HAZY (A) 09/17/2020 1145   LABSPEC 1.012 09/17/2020 1145   PHURINE 5.0 09/17/2020 1145   GLUCOSEU NEGATIVE 09/17/2020 1145   HGBUR SMALL (A) 09/17/2020 1145   BILIRUBINUR NEGATIVE 09/17/2020 1145   KETONESUR NEGATIVE 09/17/2020 1145   PROTEINUR NEGATIVE 09/17/2020 1145   NITRITE NEGATIVE 09/17/2020 1145   LEUKOCYTESUR SMALL (A) 09/17/2020 1145    Radiological Exams on Admission: DG Chest Portable 1 View  Result Date: 09/26/2022 CLINICAL DATA:  88 year old female with productive cough, headache and body ache for 2 days. EXAM: PORTABLE CHEST 1 VIEW COMPARISON:  Chest radiographs 02/27/2022. FINDINGS: Portable AP upright view at 0938 hours. Lower lung volumes. Stable cardiac size and mediastinal contours. Mild cardiomegaly. Chronic appearing upper lobe lung disease with asymmetric reticulonodular opacity greater on the right not definitely changed from April. No superimposed pneumothorax, pulmonary edema, pleural effusion or consolidation. Stable  trachea. Osteopenia. No acute osseous abnormality identified. IMPRESSION: Evidence of chronic lung disease on radiographs this year. No acute cardiopulmonary abnormality identified. Electronically Signed   By: Genevie Ann M.D.   On: 09/26/2022 09:44    EKG:  ordered, Please reveiw.  Assessment/Plan Principal Problem:   Pneumonia due  to COVID-19 virus Active Problems:   Paroxysmal atrial fibrillation (HCC)   S/P left THA, posterior   Overweight (BMI 25.0-29.9)   Acute hypoxic respiratory failure: COVID+ infection. Patient presented with cough, chest congestion, chest pain and generalized body ache. She is found to be hypoxic with SPO2 87% on room air, requiring 2 L of supplemental oxygen. Continue supplemental oxygen and wean as tolerated. Continue airborne and droplet precautions. Continue Decadron 6 mg daily for 10 days Started on molnapuravir as per protocol. Continue to monitor inflammatory markers.  COPD: Continue home inhalers. At baseline she does not use oxygen. Continue supplemental oxygen and wean as tolerated  Paroxysmal A-fib: Heart rate reasonably controlled. Continue Cardizem and Eliquis.  Hypertension Continue Cardizem, Aldactone, hold lisinopril.  Major depression: Continue Remeron  Chronic diastolic CHF.: Last echo 0000000  shows EF 55 to 60%. Obtain 2D echocardiogram. Received Lasix 40 mg IV once. Reassess need for Lasix.  GERD: Continue pantoprazole 40 mg daily  Arthritis: Continue gabapentin and Robaxin.   DVT prophylaxis: Eliquis Code Status: Full code Family Communication: No family at bed side. Disposition Plan:   Status is: Inpatient Remains inpatient appropriate because: Admitted for COVID pna   Consults called: None Admission status: Inpatient   Shawna Clamp MD [facility]   If 7PM-7AM, please contact night-coverage   09/26/2022, 4:09 PM

## 2022-09-26 NOTE — ED Notes (Signed)
Pt vomited with MD in room and became hypoxia @ 87% on room air

## 2022-09-26 NOTE — ED Provider Notes (Signed)
[place] DEPT Provider Note   CSN: [id number] Arrival date & time: 09/26/22  0900     History  Chief Complaint  Patient presents with   Cough    [name] is a 88 y.o. female.   Cough Patient presents with cough.  Maybe little congestion.  Body aches.  Has had for 2 to 3 days.  Patient also has had a little bit of vomiting.  On the way here.  Has baseline COPD but not on oxygen at home and has breathing treatments.  No definite sick contacts. While examining patient patient began to vomit.  Had hypoxia with the episode down to around 87%.    Past Medical History:  Diagnosis Date   Arthritis    Atrial fibrillation, transient ([place])    post op hip surgery; May 2021   Diverticulitis    Dyspnea    with exertion    GERD (gastroesophageal reflux disease)    Hyperlipidemia    Hypertension    Stroke ([place])    hx opf mini strokes    TIA (transient ischemic attack)     Home Medications Prior to Admission medications   Medication Sig Start Date End Date Taking? Authorizing Provider  acetaminophen (TYLENOL) 500 MG tablet Take 2 tablets (1,000 mg total) by mouth every 8 (eight) hours. 09/16/20   Danae Orleans, PA-C  albuterol (VENTOLIN HFA) 108 (90 Base) MCG/ACT inhaler Inhale 2 puffs into the lungs every 4 (four) hours as needed. 06/08/21   [provider]  amLODipine (NORVASC) 10 MG tablet Take 10 mg by mouth every evening.     [provider]  Ascorbic Acid (VITAMIN C) 1000 MG tablet Take 1,000 mg by mouth daily.    [provider]  benazepril (LOTENSIN) 20 MG tablet Take 1 tablet by mouth daily.    [provider]  calcium carbonate (TUMS - DOSED IN MG ELEMENTAL CALCIUM) 500 MG chewable tablet Chew 1-2 tablets by mouth 3 (three) times daily as needed for indigestion or heartburn.    [provider]  cetirizine (ZYRTEC) 10 MG tablet Take 10 mg by mouth daily.     [provider]   Cholecalciferol (VITAMIN D3) 125 MCG (5000 UT) TABS Take 5,000 Units by mouth daily.    [provider]  cyanocobalamin 1000 MCG tablet Take 1 tablet by mouth every other day.    [provider]  diltiazem (CARDIZEM) 30 MG tablet Take 1-2 tablets (30-60 mg total) by mouth 4 (four) times daily as needed (for heart rates greater than 100 bpm during atrial fibrillation). 06/30/21   Allred, Jeneen Rinks, MD  ELIQUIS 5 MG TABS tablet TAKE 1 TABLET BY MOUTH TWICE A DAY 07/16/22   Allred, Jeneen Rinks, MD  ferrous sulfate (FERROUSUL) 325 (65 FE) MG tablet Take 1 tablet (325 mg total) by mouth 3 (three) times daily with meals for 14 days. 09/16/20 07/04/21  Danae Orleans, PA-C  gabapentin (NEURONTIN) 300 MG capsule Take 300 mg by mouth 3 (three) times daily.    [provider]  labetalol (NORMODYNE) 200 MG tablet Take 200 mg by mouth 2 (two) times daily.    [provider]  methocarbamol (ROBAXIN) 750 MG tablet Take 750 mg by mouth 3 (three) times daily as needed. 03/06/22   [provider]  mirtazapine (REMERON) 15 MG tablet Take 15 mg by mouth at bedtime. 02/27/22   [provider]  Multiple Vitamin (MULTIVITAMIN WITH MINERALS) TABS tablet Take 1 tablet by mouth  daily.    [provider]  Multiple Vitamins-Minerals (PRESERVISION AREDS 2 PO) Take 1 tablet by mouth daily.    [provider]  pantoprazole (PROTONIX) 40 MG tablet Take 40 mg by mouth daily. 05/13/21   [provider]  Potassium Chloride (KCL-20 PO) Take 20 mEq by mouth daily with supper.     [provider]  spironolactone (ALDACTONE) 25 MG tablet Take 25 mg by mouth daily.     [provider]  sucralfate (CARAFATE) 1 g tablet Take 1 g by mouth 4 (four) times daily -  with meals and at bedtime.    [provider]  Vitamin E 400 units TABS Take 400 Units by mouth daily.     [provider]      Allergies    Keflex [cephalexin]    Review of  Systems   Review of Systems  Respiratory:  Positive for cough.     Physical Exam Updated Vital Signs BP (!) 165/67   Pulse 77   Temp 100.2 F (37.9 C) (Oral)   Resp 20   Ht 5\' 4"  (1.626 m)   Wt 87 kg   SpO2 92%   BMI 32.92 kg/m  Physical Exam Vitals and nursing note reviewed.  Eyes:     Pupils: Pupils are equal, round, and reactive to light.  Cardiovascular:     Rate and Rhythm: Regular rhythm.  Pulmonary:     Comments: Diffuse harsh breath sounds Abdominal:     Tenderness: There is no abdominal tenderness.  Musculoskeletal:        General: No tenderness.  Skin:    Capillary Refill: Capillary refill takes less than 2 seconds.  Neurological:     Mental Status: She is alert and oriented to person, place, and time.     ED Results / Procedures / Treatments   Labs (all labs ordered are listed, but only abnormal results are displayed) Labs Reviewed  RESP PANEL BY RT-PCR (FLU A&B, COVID) ARPGX2 - Abnormal; Notable for the following components:      Result Value   SARS Coronavirus 2 by RT PCR POSITIVE (*)    All other components within normal limits  COMPREHENSIVE METABOLIC PANEL - Abnormal; Notable for the following components:   Glucose, Bld 117 (*)    Calcium 8.6 (*)    Total Protein 6.4 (*)    Anion gap 4 (*)    All other components within normal limits  LIPASE, BLOOD  CBC WITH DIFFERENTIAL/PLATELET    EKG None  Radiology DG Chest Portable 1 View  Result Date: 09/26/2022 CLINICAL DATA:  88 year old female with productive cough, headache and body ache for 2 days. EXAM: PORTABLE CHEST 1 VIEW COMPARISON:  Chest radiographs 02/27/2022. FINDINGS: Portable AP upright view at 0938 hours. Lower lung volumes. Stable cardiac size and mediastinal contours. Mild cardiomegaly. Chronic appearing upper lobe lung disease with asymmetric reticulonodular opacity greater on the right not definitely changed from April. No superimposed pneumothorax, pulmonary edema, pleural  effusion or consolidation. Stable trachea. Osteopenia. No acute osseous abnormality identified. IMPRESSION: Evidence of chronic lung disease on radiographs this year. No acute cardiopulmonary abnormality identified. Electronically Signed   By: Genevie Ann M.D.   On: 09/26/2022 09:44    Procedures Procedures    Medications Ordered in ED Medications  ondansetron (ZOFRAN) injection 4 mg (4 mg Intravenous Given 09/26/22 0950)    ED Course/ Medical Decision Making/ A&P  Medical Decision Making Amount and/or Complexity of Data Reviewed Labs: ordered. Radiology: ordered.  Risk Prescription drug management.   Patient with cough for last few days.  Has had some vomiting.  Mild hypoxia.  Harsh breath sounds.  We will get basic blood work and x-ray.  Will give Zofran.  Chest x-ray independently interpreted and shows no pneumonia, lab work reassuring.  However does have a positive COVID test.  With episode of hypoxia I feel she would benefit from admission to the hospital.  Will discuss with hospitalist.  Is higher risk with her underlying COPD.          Final Clinical Impression(s) / ED Diagnoses Final diagnoses:  COVID-19  Hypoxia    Rx / DC Orders ED Discharge Orders     None         Benjiman Core, MD 09/26/22 1215

## 2022-09-26 NOTE — Progress Notes (Signed)
RN called and asked for RT to look at the Pt that she looked like she is struggling breathing but was on 2L with SATS of 95%. RT evaluated the Pt and she has rhonchi in her neck and upper lobes. The Pt struggles more when she coughs. RT gave the Pt an albuterol inhaler and it didn't relieve her cough. RT told the nurse she may need lasix. The Pt's leg were slightly swollen. RT will continue to monitor.

## 2022-09-26 NOTE — ED Notes (Signed)
Pt noted to have significant increased work of breathing. SaO2 down to 88% on room air after repositioning in bed. Placed on 2L [place]. RT called.

## 2022-09-26 NOTE — ED Triage Notes (Signed)
Pt reports congestions with productive cough, headache, and bodyaches x2 days.

## 2022-09-26 NOTE — ED Notes (Signed)
Family member at bedside states she already gave patient her dose of gabapentin due at 1600. Will refuse dose due now. Family member also states that patient takes 1 (300 mg) capsule of gabapentin QAM and 1 (300 mg) capsules at 1600. Will notify pharmacy.

## 2022-09-27 ENCOUNTER — Inpatient Hospital Stay (HOSPITAL_COMMUNITY): Payer: Medicare Other

## 2022-09-27 DIAGNOSIS — R0609 Other forms of dyspnea: Secondary | ICD-10-CM

## 2022-09-27 LAB — ECHOCARDIOGRAM COMPLETE
AR max vel: 2.18 cm2
AV Area VTI: 2.06 cm2
AV Area mean vel: 1.91 cm2
AV Mean grad: 3 mmHg
AV Peak grad: 5.1 mmHg
Ao pk vel: 1.13 m/s
Area-P 1/2: 3.12 cm2
Height: 64 in
MV VTI: 2.24 cm2
S' Lateral: 2.8 cm
Weight: 3068.8 oz

## 2022-09-27 LAB — FERRITIN: Ferritin: 327 ng/mL — ABNORMAL HIGH (ref 11–307)

## 2022-09-27 LAB — CBC WITH DIFFERENTIAL/PLATELET
Abs Immature Granulocytes: 0.03 10*3/uL (ref 0.00–0.07)
Basophils Absolute: 0 10*3/uL (ref 0.0–0.1)
Basophils Relative: 0 %
Eosinophils Absolute: 0 10*3/uL (ref 0.0–0.5)
Eosinophils Relative: 0 %
HCT: 36.4 % (ref 36.0–46.0)
Hemoglobin: 11.6 g/dL — ABNORMAL LOW (ref 12.0–15.0)
Immature Granulocytes: 0 %
Lymphocytes Relative: 9 %
Lymphs Abs: 1 10*3/uL (ref 0.7–4.0)
MCH: 27.8 pg (ref 26.0–34.0)
MCHC: 31.9 g/dL (ref 30.0–36.0)
MCV: 87.1 fL (ref 80.0–100.0)
Monocytes Absolute: 0.5 10*3/uL (ref 0.1–1.0)
Monocytes Relative: 5 %
Neutro Abs: 9.1 10*3/uL — ABNORMAL HIGH (ref 1.7–7.7)
Neutrophils Relative %: 86 %
Platelets: 176 10*3/uL (ref 150–400)
RBC: 4.18 MIL/uL (ref 3.87–5.11)
RDW: 13.9 % (ref 11.5–15.5)
WBC: 10.7 10*3/uL — ABNORMAL HIGH (ref 4.0–10.5)
nRBC: 0 % (ref 0.0–0.2)

## 2022-09-27 LAB — COMPREHENSIVE METABOLIC PANEL
ALT: 19 U/L (ref 0–44)
AST: 19 U/L (ref 15–41)
Albumin: 3 g/dL — ABNORMAL LOW (ref 3.5–5.0)
Alkaline Phosphatase: 52 U/L (ref 38–126)
Anion gap: 7 (ref 5–15)
BUN: 17 mg/dL (ref 8–23)
CO2: 25 mmol/L (ref 22–32)
Calcium: 8.7 mg/dL — ABNORMAL LOW (ref 8.9–10.3)
Chloride: 104 mmol/L (ref 98–111)
Creatinine, Ser: 0.7 mg/dL (ref 0.44–1.00)
GFR, Estimated: >60 mL/min (ref >60)
Glucose, Bld: 151 mg/dL — ABNORMAL HIGH (ref 70–99)
Potassium: 4.3 mmol/L (ref 3.5–5.1)
Sodium: 136 mmol/L (ref 135–145)
Total Bilirubin: 0.4 mg/dL (ref 0.3–1.2)
Total Protein: 5.8 g/dL — ABNORMAL LOW (ref 6.5–8.1)

## 2022-09-27 LAB — C-REACTIVE PROTEIN: CRP: 8.5 mg/dL — ABNORMAL HIGH (ref <1.0)

## 2022-09-27 LAB — PROCALCITONIN: Procalcitonin: <0.1 ng/mL

## 2022-09-27 LAB — MAGNESIUM: Magnesium: 1.7 mg/dL (ref 1.7–2.4)

## 2022-09-27 LAB — PHOSPHORUS: Phosphorus: 4 mg/dL (ref 2.5–4.6)

## 2022-09-27 MED ORDER — FUROSEMIDE 10 MG/ML IJ SOLN
40.0000 mg | Freq: Once | INTRAMUSCULAR | Status: AC
  Administered 2022-09-27: 40 mg via INTRAVENOUS
  Filled 2022-09-27: qty 4

## 2022-09-27 NOTE — Progress Notes (Signed)
*  PRELIMINARY RESULTS* Echocardiogram 2D Echocardiogram has been performed.  Carolyne Fiscal 09/27/2022, 4:10 PM

## 2022-09-27 NOTE — ED Notes (Signed)
ED TO INPATIENT HANDOFF REPORT  ED Nurse Name and Phone #: Quishae/Paddy Neis  S Name/Age/Gender [name] 88 y.o. female Room/Bed: WA13/WA13  Code Status   Code Status: Full Code  Home/SNF/Other Home Patient oriented to: self, place, time, and situation Is this baseline? Yes   Triage Complete: Triage complete  Chief Complaint Pneumonia due to COVID-19 virus [U07.1, J12.82]  Triage Note Pt reports congestions with productive cough, headache, and bodyaches x2 days.    Allergies Allergies  Allergen Reactions   Keflex [Cephalexin] Other (See Comments)    "makes pt feel really bad"    Level of Care/Admitting Diagnosis ED Disposition     ED Disposition  Admit   Condition  --   [place]: [place] [[id number]]  Level of Care: Progressive [102]  Admit to Progressive based on following criteria: RESPIRATORY PROBLEMS hypoxemic/hypercapnic respiratory failure that is responsive to NIPPV (BiPAP) or High Flow Nasal Cannula (6-80 lpm). Frequent assessment/intervention, no > Q2 hrs < Q4 hrs, to maintain oxygenation and pulmonary hygiene.  May admit patient to [facility] or [facility] if equivalent level of care is available:: Yes  Covid Evaluation: Confirmed COVID Positive  Diagnosis: Pneumonia due to COVID-19 virus [id number]  Admitting Physician: Tarry Kos  Attending Physician: KUMAR, PARDEEP [id number]  Certification:: I certify this patient will need inpatient services for at least 2 midnights  Estimated Length of Stay: 3          B Medical/Surgery History Past Medical History:  Diagnosis Date   Arthritis    Atrial fibrillation, transient ([place])    post op hip surgery; May 2021   Diverticulitis    Dyspnea    with exertion    GERD (gastroesophageal reflux disease)    Hyperlipidemia    Hypertension    Stroke ([place])    hx opf mini strokes    TIA (transient ischemic attack)    Past Surgical History:  Procedure  Laterality Date   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/13/2020   Procedure: CONVERSION TO LEFT TOTAL HIP-POSTERIOR APPROACH;  Surgeon: Paralee Cancel, MD;  Location: [facility];  Service: Orthopedics;  Laterality: Left;  90 mins   HIP FRACTURE SURGERY Left 04/16/2020   KNEE SURGERY     WRIST SURGERY       A IV Location/Drains/Wounds Patient Lines/Drains/Airways Status     Active Line/Drains/Airways     Name Placement date Placement time Site Days   Peripheral IV 09/26/22 20 G Left Antecubital 09/26/22  --  Antecubital  1   Incision (Closed) 09/13/20 Hip Left 09/13/20  1035  -- 744            Intake/Output Last 24 hours  Intake/Output Summary (Last 24 hours) at 09/27/2022 1242 Last data filed at 09/26/2022 1515 Gross per 24 hour  Intake 28.44 ml  Output --  Net 28.44 ml    Labs/Imaging Results for orders placed or performed during the hospital encounter of 09/26/22 (from the past 48 hour(s))  Comprehensive metabolic panel     Status: Abnormal   Collection Time: 09/26/22  9:47 AM  Result Value Ref Range   Sodium 136 135 - 145 mmol/L   Potassium 4.0 3.5 - 5.1 mmol/L   Chloride 108 98 - 111 mmol/L   CO2 24 22 - 32 mmol/L   Glucose, Bld 117 (H) 70 - 99 mg/dL    Comment: Glucose reference range applies only to samples taken after fasting for at least 8 hours.  BUN 12 8 - 23 mg/dL   Creatinine, Ser 0.72 0.44 - 1.00 mg/dL   Calcium 8.6 (L) 8.9 - 10.3 mg/dL   Total Protein 6.4 (L) 6.5 - 8.1 g/dL   Albumin 3.5 3.5 - 5.0 g/dL   AST 22 15 - 41 U/L   ALT 21 0 - 44 U/L   Alkaline Phosphatase 63 38 - 126 U/L   Total Bilirubin 0.5 0.3 - 1.2 mg/dL   GFR, Estimated >60 >60 mL/min    Comment: (NOTE) Calculated using the CKD-EPI Creatinine Equation (2021)    Anion gap 4 (L) 5 - 15    Comment: Performed at [facility], [place] [address], [place], [place] [address]  Lipase, blood     Status: None   Collection Time: 09/26/22  9:47 AM  Result Value Ref Range   Lipase 35  11 - 51 U/L    Comment: Performed at [facility], [place] [address] [place], [place] [address]  CBC with Differential     Status: None   Collection Time: 09/26/22  9:47 AM  Result Value Ref Range   WBC 8.8 4.0 - 10.5 K/uL   RBC 4.38 3.87 - 5.11 MIL/uL   Hemoglobin 12.1 12.0 - 15.0 g/dL   HCT 38.5 36.0 - 46.0 %   MCV 87.9 80.0 - 100.0 fL   MCH 27.6 26.0 - 34.0 pg   MCHC 31.4 30.0 - 36.0 g/dL   RDW 13.9 11.5 - 15.5 %   Platelets 174 150 - 400 K/uL   nRBC 0.0 0.0 - 0.2 %   Neutrophils Relative % 84 %   Neutro Abs 7.4 1.7 - 7.7 K/uL   Lymphocytes Relative 8 %   Lymphs Abs 0.7 0.7 - 4.0 K/uL   Monocytes Relative 8 %   Monocytes Absolute 0.7 0.1 - 1.0 K/uL   Eosinophils Relative 0 %   Eosinophils Absolute 0.0 0.0 - 0.5 K/uL   Basophils Relative 0 %   Basophils Absolute 0.0 0.0 - 0.1 K/uL   Immature Granulocytes 0 %   Abs Immature Granulocytes 0.02 0.00 - 0.07 K/uL    Comment: Performed at [facility], [place] [address], [place], [place] [address]  Resp Panel by RT-PCR (Flu A&B, Covid) Anterior Nasal Swab     Status: Abnormal   Collection Time: 09/26/22  9:47 AM   Specimen: Anterior Nasal Swab  Result Value Ref Range   SARS Coronavirus 2 by RT PCR POSITIVE (A) NEGATIVE    Comment: (NOTE) SARS-CoV-2 target nucleic acids are DETECTED.  The SARS-CoV-2 RNA is generally detectable in upper respiratory specimens during the acute phase of infection. Positive results are indicative of the presence of the identified virus, but do not rule out bacterial infection or co-infection with other pathogens not detected by the test. Clinical correlation with patient history and other diagnostic information is necessary to determine patient infection status. The expected result is Negative.  Fact Sheet for Patients: EntrepreneurPulse.com.au  Fact Sheet for Healthcare Providers: IncredibleEmployment.be  This test is not yet  approved or cleared by the [place] FDA and  has been authorized for detection and/or diagnosis of SARS-CoV-2 by FDA under an Emergency Use Authorization (EUA).  This EUA will remain in effect (meaning this test can be used) for the duration of  the COVID-19 declaration under Section 564(b)(1) of the A ct, 21 U.S.C. section 360bbb-3(b)(1), unless the authorization is terminated or revoked sooner.     Influenza A by  PCR NEGATIVE NEGATIVE   Influenza B by PCR NEGATIVE NEGATIVE    Comment: (NOTE) The Xpert Xpress SARS-CoV-2/FLU/RSV plus assay is intended as an aid in the diagnosis of influenza from Nasopharyngeal swab specimens and should not be used as a sole basis for treatment. Nasal washings and aspirates are unacceptable for Xpert Xpress SARS-CoV-2/FLU/RSV testing.  Fact Sheet for Patients: EntrepreneurPulse.com.au  Fact Sheet for Healthcare Providers: IncredibleEmployment.be  This test is not yet approved or cleared by the [place] FDA and has been authorized for detection and/or diagnosis of SARS-CoV-2 by FDA under an Emergency Use Authorization (EUA). This EUA will remain in effect (meaning this test can be used) for the duration of the COVID-19 declaration under Section 564(b)(1) of the Act, 21 U.S.C. section 360bbb-3(b)(1), unless the authorization is terminated or revoked.  Performed at [facility], [place] [address] [place], [place] [address]   CBC with Differential/Platelet     Status: Abnormal   Collection Time: 09/27/22  5:20 AM  Result Value Ref Range   WBC 10.7 (H) 4.0 - 10.5 K/uL   RBC 4.18 3.87 - 5.11 MIL/uL   Hemoglobin 11.6 (L) 12.0 - 15.0 g/dL   HCT 36.4 36.0 - 46.0 %   MCV 87.1 80.0 - 100.0 fL   MCH 27.8 26.0 - 34.0 pg   MCHC 31.9 30.0 - 36.0 g/dL   RDW 13.9 11.5 - 15.5 %   Platelets 176 150 - 400 K/uL   nRBC 0.0 0.0 - 0.2 %   Neutrophils Relative % 86 %   Neutro Abs 9.1 (H) 1.7 - 7.7  K/uL   Lymphocytes Relative 9 %   Lymphs Abs 1.0 0.7 - 4.0 K/uL   Monocytes Relative 5 %   Monocytes Absolute 0.5 0.1 - 1.0 K/uL   Eosinophils Relative 0 %   Eosinophils Absolute 0.0 0.0 - 0.5 K/uL   Basophils Relative 0 %   Basophils Absolute 0.0 0.0 - 0.1 K/uL   Immature Granulocytes 0 %   Abs Immature Granulocytes 0.03 0.00 - 0.07 K/uL    Comment: Performed at [facility], [place] [address] [place], [place] [address]  Comprehensive metabolic panel     Status: Abnormal   Collection Time: 09/27/22  5:20 AM  Result Value Ref Range   Sodium 136 135 - 145 mmol/L   Potassium 4.3 3.5 - 5.1 mmol/L   Chloride 104 98 - 111 mmol/L   CO2 25 22 - 32 mmol/L   Glucose, Bld 151 (H) 70 - 99 mg/dL    Comment: Glucose reference range applies only to samples taken after fasting for at least 8 hours.   BUN 17 8 - 23 mg/dL   Creatinine, Ser 0.70 0.44 - 1.00 mg/dL   Calcium 8.7 (L) 8.9 - 10.3 mg/dL   Total Protein 5.8 (L) 6.5 - 8.1 g/dL   Albumin 3.0 (L) 3.5 - 5.0 g/dL   AST 19 15 - 41 U/L   ALT 19 0 - 44 U/L   Alkaline Phosphatase 52 38 - 126 U/L   Total Bilirubin 0.4 0.3 - 1.2 mg/dL   GFR, Estimated >60 >60 mL/min    Comment: (NOTE) Calculated using the CKD-EPI Creatinine Equation (2021)    Anion gap 7 5 - 15    Comment: Performed at [facility], [place] [address] [place], [place] [address]  C-reactive protein     Status: Abnormal   Collection Time: 09/27/22  5:20 AM  Result Value Ref Range  CRP 8.5 (H) <1.0 mg/dL    Comment: Performed at [facility], [address], [place], [place] [address]  Ferritin     Status: Abnormal   Collection Time: 09/27/22  5:20 AM  Result Value Ref Range   Ferritin 327 (H) 11 - 307 ng/mL    Comment: Performed at [facility], [address], [place], [place] [address]  Magnesium     Status: None   Collection Time: 09/27/22  5:20 AM  Result Value Ref Range   Magnesium 1.7 1.7 - 2.4 mg/dL     Comment: Performed at [facility] [address], [place], [place] [address]  Phosphorus     Status: None   Collection Time: 09/27/22  5:20 AM  Result Value Ref Range   Phosphorus 4.0 2.5 - 4.6 mg/dL    Comment: Performed at [facility], [address] [place], [place] [address]   DG Chest Portable 1 View  Result Date: 09/26/2022 CLINICAL DATA:  88 year old female with productive cough, headache and body ache for 2 days. EXAM: PORTABLE CHEST 1 VIEW COMPARISON:  Chest radiographs 02/27/2022. FINDINGS: Portable AP upright view at 0938 hours. Lower lung volumes. Stable cardiac size and mediastinal contours. Mild cardiomegaly. Chronic appearing upper lobe lung disease with asymmetric reticulonodular opacity greater on the right not definitely changed from April. No superimposed pneumothorax, pulmonary edema, pleural effusion or consolidation. Stable trachea. Osteopenia. No acute osseous abnormality identified. IMPRESSION: Evidence of chronic lung disease on radiographs this year. No acute cardiopulmonary abnormality identified. Electronically Signed   By: Odessa Fleming M.D.   On: 09/26/2022 09:44    Pending Labs Unresulted Labs (From admission, onward)     Start     Ordered   09/27/22 0500  CBC with Differential/Platelet  Daily at 5am,   R      09/26/22 1236   09/27/22 0500  Comprehensive metabolic panel  Daily at 5am,   R      09/26/22 1236   09/27/22 0500  C-reactive protein  Daily at 5am,   R      09/26/22 1236   09/27/22 0500  Ferritin  Daily at 5am,   R      09/26/22 1236   09/27/22 0500  Magnesium  Daily at 5am,   R      09/26/22 1236   09/27/22 0500  Phosphorus  Daily at 5am,   R      09/26/22 1236            Vitals/Pain Today's Vitals   09/27/22 0700 09/27/22 0800 09/27/22 0830 09/27/22 0920  BP: 135/68 136/64 132/64   Pulse: (!) 51 (!) 48 (!) 49   Resp: 16 13 15    Temp:    97.6 F (36.4 C)  TempSrc:    Oral  SpO2: 95% 99% 97%    Weight:      Height:      PainSc:        Isolation Precautions Airborne and Contact precautions  Medications Medications  acetaminophen (TYLENOL) tablet 1,000 mg (1,000 mg Oral Given 09/27/22 0529)  amLODipine (NORVASC) tablet 10 mg (10 mg Oral Given 09/26/22 1802)  spironolactone (ALDACTONE) tablet 25 mg (25 mg Oral Given 09/27/22 1023)  mirtazapine (REMERON) tablet 15 mg (15 mg Oral Given 09/26/22 2300)  pantoprazole (PROTONIX) EC tablet 40 mg (40 mg Oral Given 09/27/22 1006)  cyanocobalamin (VITAMIN B12) tablet 1,000 mcg (has no administration in time range)  apixaban (  ELIQUIS) tablet 5 mg (5 mg Oral Given 09/27/22 1007)  methocarbamol (ROBAXIN) tablet 750 mg (has no administration in time range)  ascorbic acid (VITAMIN C) tablet 1,000 mg (1,000 mg Oral Given 09/27/22 1007)  cholecalciferol (VITAMIN D3) 25 MCG (1000 UNIT) tablet 5,000 Units (5,000 Units Oral Given 09/27/22 1004)  albuterol (VENTOLIN HFA) 108 (90 Base) MCG/ACT inhaler 2 puff (has no administration in time range)  guaiFENesin-dextromethorphan (ROBITUSSIN DM) 100-10 MG/5ML syrup 10 mL (10 mLs Oral Given 09/26/22 1519)  chlorpheniramine-HYDROcodone (TUSSIONEX) 10-8 MG/5ML suspension 5 mL (has no administration in time range)  docusate sodium (COLACE) capsule 100 mg (100 mg Oral Given 09/27/22 1007)  ondansetron (ZOFRAN) tablet 4 mg (has no administration in time range)    Or  ondansetron (ZOFRAN) injection 4 mg (has no administration in time range)  dexamethasone (DECADRON) tablet 6 mg (6 mg Oral Given 09/27/22 1007)  gabapentin (NEURONTIN) capsule 300 mg (300 mg Oral Patient Refused/Not Given 09/26/22 1853)  gabapentin (NEURONTIN) capsule 300 mg (300 mg Oral Given 09/27/22 1006)    And  gabapentin (NEURONTIN) capsule 600 mg (has no administration in time range)  molnupiravir EUA (LAGEVRIO) capsule 800 mg (800 mg Oral Given 09/27/22 0206)  ondansetron (ZOFRAN) injection 4 mg (4 mg Intravenous Given 09/26/22 0950)  albuterol  (VENTOLIN HFA) 108 (90 Base) MCG/ACT inhaler (  Given 09/26/22 1503)  furosemide (LASIX) injection 40 mg (40 mg Intravenous Given 09/26/22 1518)    Mobility walks with person assist Low fall risk   Focused Assessments     R Recommendations: See Admitting Provider Note  Report given to:   Additional Notes:

## 2022-09-27 NOTE — Progress Notes (Signed)
PROGRESS NOTE    [name]  YJE:[id number] DOB: 11/02/1933 DOA: 09/26/2022 PCP: Hadley Pen, MD   Brief Narrative:  This 88 years old female with PMH significant for COPD not on home oxygen, atrial fibrillation on Eliquis, arthritis, hypertension, hyperlipidemia, GERD presented to the ED with C/O: Nonproductive cough associated with chest congestion and body ache for last 2 to 3 days.  Patient describes cough is dry associated with congestion. She denies sick contacts,  recent travel. She has COPD and  does not use oxygen at baseline. She has been using inhalers without any improvement.  She is up-to-date with COVID vaccination including booster.  Chest x-ray shows evidence of chronic lung disease.  Patient is admitted for COVID+ pneumonia  Assessment & Plan:   Principal Problem:   Pneumonia due to COVID-19 virus Active Problems:   Paroxysmal atrial fibrillation (HCC)   S/P left THA, posterior   Overweight (BMI 25.0-29.9)  Acute hypoxic respiratory failure: COVID+ infection. Patient presented with cough, chest congestion, chest pain and generalized body ache. She was found to be hypoxic with SPO2 87% on room air, requiring 2 L of supplemental oxygen. Continue supplemental oxygen and wean as tolerated. Continue airborne and droplet precautions. Continue Decadron 6 mg daily for 10 days Started on molnapuravir as per protocol. Continue to monitor inflammatory markers.   COPD: Continue home inhalers. At baseline she does not use oxygen. Continue supplemental oxygen and wean as tolerated   Paroxysmal A-fib: Heart rate reasonably controlled. Continue Cardizem and Eliquis.   Hypertension Continue Cardizem, Aldactone, hold lisinopril.   Major depression: Continue Remeron   Chronic diastolic CHF.: Last echo 9/21  shows EF 55 to 60%. Obtain 2D echocardiogram. Received Lasix 40 mg IV x 2 Reassess need for Lasix.   GERD: Continue pantoprazole 40 mg daily    Arthritis: Continue gabapentin and Robaxin.   DVT prophylaxis: Eliquis Code Status: Full code Family Communication: Daughter at bed side Disposition Plan:   Status is: Inpatient Remains inpatient appropriate because:Admitted  for COVID-pneumonia, started on molpuranivir   Consultants:  None  Procedures: None  Antimicrobials:  Anti-infectives (From admission, onward)    Start     Dose/Rate Route Frequency Ordered Stop   09/26/22 2200  molnupiravir EUA (LAGEVRIO) capsule 800 mg        4 capsule Oral 2 times daily 09/26/22 2056 10/01/22 2159   09/26/22 1600  molnupiravir EUA (LAGEVRIO) capsule 800 mg  Status:  Discontinued        4 capsule Oral 2 times daily 09/26/22 1403 09/26/22 2056   09/26/22 1245  nirmatrelvir/ritonavir EUA (renal dosing) (PAXLOVID) 2 tablet  Status:  Discontinued        2 tablet Oral 2 times daily 09/26/22 1236 09/26/22 1403       Subjective: Patient was seen and examined at bedside.  Overnight events noted.  Patient reports feeling slightly better, she remains on 2 L of supplemental oxygen. She still reports having cough and congestion.  Objective: Vitals:   09/27/22 0700 09/27/22 0800 09/27/22 0830 09/27/22 0920  BP: 135/68 136/64 132/64   Pulse: (!) 51 (!) 48 (!) 49   Resp: 16 13 15    Temp:    97.6 F (36.4 C)  TempSrc:    Oral  SpO2: 95% 99% 97%   Weight:      Height:        Intake/Output Summary (Last 24 hours) at 09/27/2022 1418 Last data filed at 09/26/2022 1515 Gross per 24 hour  Intake  28.44 ml  Output --  Net 28.44 ml   Filed Weights   09/26/22 0919  Weight: 87 kg    Examination:  General exam: Appears comfortable, not in any acute distress, deconditioned. Respiratory system: Decreased breath sounds, respiratory effort normal, RR 15. Cardiovascular system: S1 & S2 heard, regular rate and rhythm, no murmur. Gastrointestinal system: Abdomen is soft, non tender, non distended, BS+ Central nervous system: Alert and oriented x  2. No focal neurological deficits. Extremities: No edema, no cyanosis, no clubbing. Skin: No rashes, lesions or ulcers Psychiatry: Judgement and insight appear normal. Mood & affect appropriate.     Data Reviewed: I have personally reviewed following labs and imaging studies  CBC: Recent Labs  Lab 09/26/22 0947 09/27/22 0520  WBC 8.8 10.7*  NEUTROABS 7.4 9.1*  HGB 12.1 11.6*  HCT 38.5 36.4  MCV 87.9 87.1  PLT 174 0000000   Basic Metabolic Panel: Recent Labs  Lab 09/26/22 0947 09/27/22 0520  NA 136 136  K 4.0 4.3  CL 108 104  CO2 24 25  GLUCOSE 117* 151*  BUN 12 17  CREATININE 0.72 0.70  CALCIUM 8.6* 8.7*  MG  --  1.7  PHOS  --  4.0   GFR: Estimated Creatinine Clearance: 52.9 mL/min (by C-G formula based on SCr of 0.7 mg/dL). Liver Function Tests: Recent Labs  Lab 09/26/22 0947 09/27/22 0520  AST 22 19  ALT 21 19  ALKPHOS 63 52  BILITOT 0.5 0.4  PROT 6.4* 5.8*  ALBUMIN 3.5 3.0*   Recent Labs  Lab 09/26/22 0947  LIPASE 35   No results for input(s): "AMMONIA" in the last 168 hours. Coagulation Profile: No results for input(s): "INR", "PROTIME" in the last 168 hours. Cardiac Enzymes: No results for input(s): "CKTOTAL", "CKMB", "CKMBINDEX", "TROPONINI" in the last 168 hours. BNP (last 3 results) No results for input(s): "PROBNP" in the last 8760 hours. HbA1C: No results for input(s): "HGBA1C" in the last 72 hours. CBG: No results for input(s): "GLUCAP" in the last 168 hours. Lipid Profile: No results for input(s): "CHOL", "HDL", "LDLCALC", "TRIG", "CHOLHDL", "LDLDIRECT" in the last 72 hours. Thyroid Function Tests: No results for input(s): "TSH", "T4TOTAL", "FREET4", "T3FREE", "THYROIDAB" in the last 72 hours. Anemia Panel: Recent Labs    09/27/22 0520  FERRITIN 327*   Sepsis Labs: No results for input(s): "PROCALCITON", "LATICACIDVEN" in the last 168 hours.  Recent Results (from the past 240 hour(s))  Resp Panel by RT-PCR (Flu A&B, Covid)  Anterior Nasal Swab     Status: Abnormal   Collection Time: 09/26/22  9:47 AM   Specimen: Anterior Nasal Swab  Result Value Ref Range Status   SARS Coronavirus 2 by RT PCR POSITIVE (A) NEGATIVE Final    Comment: (NOTE) SARS-CoV-2 target nucleic acids are DETECTED.  The SARS-CoV-2 RNA is generally detectable in upper respiratory specimens during the acute phase of infection. Positive results are indicative of the presence of the identified virus, but do not rule out bacterial infection or co-infection with other pathogens not detected by the test. Clinical correlation with patient history and other diagnostic information is necessary to determine patient infection status. The expected result is Negative.  Fact Sheet for Patients: EntrepreneurPulse.com.au  Fact Sheet for Healthcare Providers: IncredibleEmployment.be  This test is not yet approved or cleared by the [place] FDA and  has been authorized for detection and/or diagnosis of SARS-CoV-2 by FDA under an Emergency Use Authorization (EUA).  This EUA will remain in effect (meaning this  test can be used) for the duration of  the COVID-19 declaration under Section 564(b)(1) of the A ct, 21 U.S.C. section 360bbb-3(b)(1), unless the authorization is terminated or revoked sooner.     Influenza A by PCR NEGATIVE NEGATIVE Final   Influenza B by PCR NEGATIVE NEGATIVE Final    Comment: (NOTE) The Xpert Xpress SARS-CoV-2/FLU/RSV plus assay is intended as an aid in the diagnosis of influenza from Nasopharyngeal swab specimens and should not be used as a sole basis for treatment. Nasal washings and aspirates are unacceptable for Xpert Xpress SARS-CoV-2/FLU/RSV testing.  Fact Sheet for Patients: EntrepreneurPulse.com.au  Fact Sheet for Healthcare Providers: IncredibleEmployment.be  This test is not yet approved or cleared by the [place] FDA and has  been authorized for detection and/or diagnosis of SARS-CoV-2 by FDA under an Emergency Use Authorization (EUA). This EUA will remain in effect (meaning this test can be used) for the duration of the COVID-19 declaration under Section 564(b)(1) of the Act, 21 U.S.C. section 360bbb-3(b)(1), unless the authorization is terminated or revoked.  Performed at [facility], [place] [address] [place], [place] [address]     Radiology Studies: DG Chest Portable 1 View  Result Date: 09/26/2022 CLINICAL DATA:  88 year old female with productive cough, headache and body ache for 2 days. EXAM: PORTABLE CHEST 1 VIEW COMPARISON:  Chest radiographs 02/27/2022. FINDINGS: Portable AP upright view at 0938 hours. Lower lung volumes. Stable cardiac size and mediastinal contours. Mild cardiomegaly. Chronic appearing upper lobe lung disease with asymmetric reticulonodular opacity greater on the right not definitely changed from April. No superimposed pneumothorax, pulmonary edema, pleural effusion or consolidation. Stable trachea. Osteopenia. No acute osseous abnormality identified. IMPRESSION: Evidence of chronic lung disease on radiographs this year. No acute cardiopulmonary abnormality identified. Electronically Signed   By: Genevie Ann M.D.   On: 09/26/2022 09:44     Scheduled Meds:  acetaminophen  1,000 mg Oral Q8H   amLODipine  10 mg Oral QPM   apixaban  5 mg Oral BID   vitamin C  1,000 mg Oral Daily   cholecalciferol  5,000 Units Oral Daily   [START ON 09/28/2022] cyanocobalamin  1,000 mcg Oral QODAY   dexamethasone  6 mg Oral Daily   docusate sodium  100 mg Oral BID   furosemide  40 mg Intravenous Once   gabapentin  300 mg Oral Once   gabapentin  300 mg Oral Daily   And   gabapentin  600 mg Oral q1600   mirtazapine  15 mg Oral QHS   molnupiravir EUA  4 capsule Oral BID   pantoprazole  40 mg Oral Daily   spironolactone  25 mg Oral Daily   Continuous Infusions:   LOS: 1 day    Time  spent: 35 mins    Erine Phenix, MD [facility]   If 7PM-7AM, please contact night-coverage

## 2022-09-28 LAB — MAGNESIUM: Magnesium: 1.7 mg/dL (ref 1.7–2.4)

## 2022-09-28 LAB — CBC WITH DIFFERENTIAL/PLATELET
Abs Immature Granulocytes: 0.08 10*3/uL — ABNORMAL HIGH (ref 0.00–0.07)
Basophils Absolute: 0 10*3/uL (ref 0.0–0.1)
Basophils Relative: 0 %
Eosinophils Absolute: 0 10*3/uL (ref 0.0–0.5)
Eosinophils Relative: 0 %
HCT: 39.1 % (ref 36.0–46.0)
Hemoglobin: 12.8 g/dL (ref 12.0–15.0)
Immature Granulocytes: 1 %
Lymphocytes Relative: 9 %
Lymphs Abs: 1.4 10*3/uL (ref 0.7–4.0)
MCH: 28.2 pg (ref 26.0–34.0)
MCHC: 32.7 g/dL (ref 30.0–36.0)
MCV: 86.1 fL (ref 80.0–100.0)
Monocytes Absolute: 1 10*3/uL (ref 0.1–1.0)
Monocytes Relative: 7 %
Neutro Abs: 12.2 10*3/uL — ABNORMAL HIGH (ref 1.7–7.7)
Neutrophils Relative %: 83 %
Platelets: 204 10*3/uL (ref 150–400)
RBC: 4.54 MIL/uL (ref 3.87–5.11)
RDW: 13.6 % (ref 11.5–15.5)
WBC: 14.6 10*3/uL — ABNORMAL HIGH (ref 4.0–10.5)
nRBC: 0 % (ref 0.0–0.2)

## 2022-09-28 LAB — COMPREHENSIVE METABOLIC PANEL
ALT: 18 U/L (ref 0–44)
AST: 20 U/L (ref 15–41)
Albumin: 3.2 g/dL — ABNORMAL LOW (ref 3.5–5.0)
Alkaline Phosphatase: 58 U/L (ref 38–126)
Anion gap: 9 (ref 5–15)
BUN: 19 mg/dL (ref 8–23)
CO2: 25 mmol/L (ref 22–32)
Calcium: 9 mg/dL (ref 8.9–10.3)
Chloride: 99 mmol/L (ref 98–111)
Creatinine, Ser: 0.68 mg/dL (ref 0.44–1.00)
GFR, Estimated: >60 mL/min (ref >60)
Glucose, Bld: 129 mg/dL — ABNORMAL HIGH (ref 70–99)
Potassium: 4.4 mmol/L (ref 3.5–5.1)
Sodium: 133 mmol/L — ABNORMAL LOW (ref 135–145)
Total Bilirubin: 0.3 mg/dL (ref 0.3–1.2)
Total Protein: 6.7 g/dL (ref 6.5–8.1)

## 2022-09-28 LAB — FERRITIN: Ferritin: 388 ng/mL — ABNORMAL HIGH (ref 11–307)

## 2022-09-28 LAB — C-REACTIVE PROTEIN: CRP: 3.8 mg/dL — ABNORMAL HIGH (ref <1.0)

## 2022-09-28 LAB — PHOSPHORUS: Phosphorus: 3.2 mg/dL (ref 2.5–4.6)

## 2022-09-28 MED ORDER — LABETALOL HCL 200 MG PO TABS
200.0000 mg | ORAL_TABLET | Freq: Two times a day (BID) | ORAL | Status: DC
  Administered 2022-09-28 – 2022-10-02 (×8): 200 mg via ORAL
  Filled 2022-09-28 (×8): qty 1

## 2022-09-28 MED ORDER — BENAZEPRIL HCL 20 MG PO TABS: 20.0000 mg | ORAL_TABLET | Freq: Two times a day (BID) | ORAL | Status: DC

## 2022-09-28 MED ORDER — BENAZEPRIL HCL 20 MG PO TABS
20.0000 mg | ORAL_TABLET | Freq: Every day | ORAL | Status: DC
  Administered 2022-09-28 – 2022-10-02 (×5): 20 mg via ORAL
  Filled 2022-09-28 (×4): qty 1

## 2022-09-28 NOTE — Evaluation (Signed)
Physical Therapy Evaluation Patient Details Name: [name] MRN: [id number] DOB: 1933-11-18 Today's Date: 09/28/2022  History of Present Illness  88 years old female with PMH significant for COPD not on home oxygen, atrial fibrillation on Eliquis, arthritis, hypertension, hyperlipidemia, GERD and admitted 09/26/22 for COVID+ pneumonia.  Clinical Impression  Pt admitted with above diagnosis.  Pt currently with functional limitations due to the deficits listed below (see PT Problem List). Pt will benefit from skilled PT to increase their independence and safety with mobility to allow discharge to the venue listed below.  Pt assisted with OOB to recliner today and remained on 2L O2 [place].  Pt with increased coughing with activity.  Pt's family present and anticipates pt to return home upon d/c.         Recommendations for follow up therapy are one component of a multi-disciplinary discharge planning process, led by the attending physician.  Recommendations may be updated based on patient status, additional functional criteria and insurance authorization.  Follow Up Recommendations Home health PT      Assistance Recommended at Discharge Intermittent Supervision/Assistance  Patient can return home with the following  A little help with walking and/or transfers;A little help with bathing/dressing/bathroom;Help with stairs or ramp for entrance;Assist for transportation;Assistance with cooking/housework    Equipment Recommendations None recommended by PT  Recommendations for Other Services       Functional Status Assessment Patient has had a recent decline in their functional status and demonstrates the ability to make significant improvements in function in a reasonable and predictable amount of time.     Precautions / Restrictions Precautions Precautions: Fall      Mobility  Bed Mobility Overal bed mobility: Needs Assistance Bed Mobility: Supine to Sit     Supine to sit: Min  assist     General bed mobility comments: light assist for upper body    Transfers Overall transfer level: Needs assistance Equipment used: Rolling walker (2 wheels) Transfers: Sit to/from Stand, Bed to chair/wheelchair/BSC Sit to Stand: Min assist   Step pivot transfers: Min assist       General transfer comment: cues for hand placement and small steps over to recliner, remained on 2L O2 [place], SPO2 98%    Ambulation/Gait               General Gait Details: pt felt weak today but agreeable to attempt OOB to recliner, anticipate ambulation next visit  Stairs            Wheelchair Mobility    Modified Rankin (Stroke Patients Only)       Balance Overall balance assessment: Needs assistance         Standing balance support: Bilateral upper extremity supported, Reliant on assistive device for balance Standing balance-Leahy Scale: Poor                               Pertinent Vitals/Pain Pain Assessment Pain Assessment: No/denies pain    Home Living Family/patient expects to be discharged to:: Private residence Living Arrangements: Children Available Help at Discharge: Family Type of Home: House Home Access: Stairs to enter   Secretary/administrator of Steps: 1   Home Layout: Able to live on main level with bedroom/bathroom Home Equipment: Agricultural consultant (2 wheels)      Prior Function Prior Level of Function : Independent/Modified Independent             Mobility Comments: uses RW  ADLs Comments: daughter supervises shower due to small step over to enter     Hand Dominance        Extremity/Trunk Assessment        Lower Extremity Assessment Lower Extremity Assessment: Generalized weakness       Communication   Communication: HOH  Cognition Arousal/Alertness: Awake/alert Behavior During Therapy: WFL for tasks assessed/performed Overall Cognitive Status: Within Functional Limits for tasks assessed                                           General Comments      Exercises     Assessment/Plan    PT Assessment Patient needs continued PT services  PT Problem List Decreased strength;Decreased activity tolerance;Decreased knowledge of use of DME;Decreased mobility;Cardiopulmonary status limiting activity       PT Treatment Interventions Gait training;DME instruction;Therapeutic exercise;Balance training;Functional mobility training;Therapeutic activities;Patient/family education    PT Goals (Current goals can be found in the Care Plan section)  Acute Rehab PT Goals PT Goal Formulation: With patient/family Time For Goal Achievement: 10/12/22 Potential to Achieve Goals: Good    Frequency Min 3X/week     Co-evaluation               AM-PAC PT "6 Clicks" Mobility  Outcome Measure Help needed turning from your back to your side while in a flat bed without using bedrails?: A Little Help needed moving from lying on your back to sitting on the side of a flat bed without using bedrails?: A Little Help needed moving to and from a bed to a chair (including a wheelchair)?: A Lot Help needed standing up from a chair using your arms (e.g., wheelchair or bedside chair)?: A Lot Help needed to walk in hospital room?: A Lot Help needed climbing 3-5 steps with a railing? : A Lot 6 Click Score: 14    End of Session Equipment Utilized During Treatment: Gait belt;Oxygen Activity Tolerance: Patient tolerated treatment well Patient left: in chair;with call bell/phone within reach;with chair alarm set;with family/visitor present Nurse Communication: Mobility status PT Visit Diagnosis: Difficulty in walking, not elsewhere classified (R26.2)    Time: [id number] PT Time Calculation (min) (ACUTE ONLY): 14 min   Charges:   PT Evaluation $PT Eval Low Complexity: 1 Low         Kati PT, DPT Physical Therapist Acute Rehabilitation Services Preferred contact method: Secure Chat Weekend Pager  Only: [phone] Office: [place] 09/28/2022, 3:55 PM

## 2022-09-28 NOTE — Progress Notes (Signed)
PROGRESS NOTE    [name]  [id number] DOB: 1933/01/30 DOA: 09/26/2022 PCP: Myrlene Broker, MD   Brief Narrative:  This 88 years old female with PMH significant for COPD not on home oxygen, atrial fibrillation on Eliquis, arthritis, hypertension, hyperlipidemia, GERD presented to the ED with C/O: Nonproductive cough associated with chest congestion and body ache for last 2 to 3 days.  Patient describes cough is dry associated with congestion. She denies sick contacts,  recent travel. She has COPD and  does not use oxygen at baseline. She has been using inhalers without any improvement.She is up-to-date with COVID vaccination including booster.  Chest x-ray shows evidence of chronic lung disease.  Patient is admitted for COVID+ pneumonia.  Assessment & Plan:   Principal Problem:   Pneumonia due to COVID-19 virus Active Problems:   Paroxysmal atrial fibrillation (HCC)   S/P left THA, posterior   Overweight (BMI 25.0-29.9)  Acute hypoxic respiratory failure: COVID+ infection. Patient presented with cough, chest congestion, chest pain and generalized body ache. She was found to be hypoxic with SPO2 87% on room air, requiring 2 L of supplemental oxygen. Continue supplemental oxygen and wean as tolerated. Continue airborne and droplet precautions. Continue Decadron 6 mg daily for 10 days Continue  molnapuravir  for 5 days. Continue to monitor inflammatory markers.   COPD: Continue home inhalers. At baseline she does not use oxygen. Continue supplemental oxygen and wean as tolerated   Paroxysmal A-fib: Heart rate reasonably controlled. Continue Cardizem and Eliquis.   Hypertension Continue Cardizem, Aldactone, hold lisinopril.   Major depression: Continue Remeron.   Chronic diastolic CHF.: Last echo 0000000  shows EF 55 to 60%. Received Lasix 40 mg IV x 2 2D echo shows LVEF 55 to 60% no RWMA Reassess need for Lasix.   GERD: Continue pantoprazole 40 mg daily    Arthritis: Continue gabapentin and Robaxin.   DVT prophylaxis: Eliquis Code Status: Full code Family Communication: Daughter at bed side Disposition Plan:   Status is: Inpatient Remains inpatient appropriate because:Admitted  for COVID-pneumonia, started on molpuranivir    Consultants:  None  Procedures: None  Antimicrobials:  Anti-infectives (From admission, onward)    Start     Dose/Rate Route Frequency Ordered Stop   09/26/22 2200  molnupiravir EUA (LAGEVRIO) capsule 800 mg        4 capsule Oral 2 times daily 09/26/22 2056 10/01/22 2159   09/26/22 1600  molnupiravir EUA (LAGEVRIO) capsule 800 mg  Status:  Discontinued        4 capsule Oral 2 times daily 09/26/22 1403 09/26/22 2056   09/26/22 1245  nirmatrelvir/ritonavir EUA (renal dosing) (PAXLOVID) 2 tablet  Status:  Discontinued        2 tablet Oral 2 times daily 09/26/22 1236 09/26/22 1403       Subjective: Patient was seen and examined at bedside.  Overnight events noted.  Patient reports feeling much better today,  cough is improved.  She remains on 2 L of oxygen.  Objective: Vitals:   09/27/22 1839 09/27/22 2155 09/28/22 0353 09/28/22 1257  BP: (!) 144/72 (!) 144/70 (!) 167/78 (!) 143/70  Pulse: 64 69 67 72  Resp: 16 16 19 20   Temp: 98.8 F (37.1 C) 97.7 F (36.5 C) 98.1 F (36.7 C) 97.7 F (36.5 C)  TempSrc: Oral Oral Oral Oral  SpO2: 98% 98% 98% 98%  Weight:      Height:        Intake/Output Summary (Last 24 hours) at  09/28/2022 1320 Last data filed at 09/28/2022 0400 Gross per 24 hour  Intake 360 ml  Output 2900 ml  Net -2540 ml   Filed Weights   09/26/22 0919  Weight: 87 kg    Examination:  General exam: Appears comfortable, not in any acute distress.  Deconditioned.Marland Kitchen Respiratory system: CTA bilaterally, respiratory effort normal, RR 14. Cardiovascular system: S1-S2 heard, regular rate and rhythm, no murmur. Gastrointestinal system: Abdomen is soft, non tender, non distended,  BS+ Central nervous system: Alert and oriented x3, no focal neurological deficits. Extremities: No edema, no cyanosis, no clubbing. Skin: No rashes, lesions or ulcers Psychiatry: Judgement and insight appear normal. Mood & affect appropriate.     Data Reviewed: I have personally reviewed following labs and imaging studies  CBC: Recent Labs  Lab 09/26/22 0947 09/27/22 0520 09/28/22 0333  WBC 8.8 10.7* 14.6*  NEUTROABS 7.4 9.1* 12.2*  HGB 12.1 11.6* 12.8  HCT 38.5 36.4 39.1  MCV 87.9 87.1 86.1  PLT 174 176 0000000   Basic Metabolic Panel: Recent Labs  Lab 09/26/22 0947 09/27/22 0520 09/28/22 0333  NA 136 136 133*  K 4.0 4.3 4.4  CL 108 104 99  CO2 24 25 25   GLUCOSE 117* 151* 129*  BUN 12 17 19   CREATININE 0.72 0.70 0.68  CALCIUM 8.6* 8.7* 9.0  MG  --  1.7 1.7  PHOS  --  4.0 3.2   GFR: Estimated Creatinine Clearance: 52.9 mL/min (by C-G formula based on SCr of 0.68 mg/dL). Liver Function Tests: Recent Labs  Lab 09/26/22 0947 09/27/22 0520 09/28/22 0333  AST 22 19 20   ALT 21 19 18   ALKPHOS 63 52 58  BILITOT 0.5 0.4 0.3  PROT 6.4* 5.8* 6.7  ALBUMIN 3.5 3.0* 3.2*   Recent Labs  Lab 09/26/22 0947  LIPASE 35   No results for input(s): "AMMONIA" in the last 168 hours. Coagulation Profile: No results for input(s): "INR", "PROTIME" in the last 168 hours. Cardiac Enzymes: No results for input(s): "CKTOTAL", "CKMB", "CKMBINDEX", "TROPONINI" in the last 168 hours. BNP (last 3 results) No results for input(s): "PROBNP" in the last 8760 hours. HbA1C: No results for input(s): "HGBA1C" in the last 72 hours. CBG: No results for input(s): "GLUCAP" in the last 168 hours. Lipid Profile: No results for input(s): "CHOL", "HDL", "LDLCALC", "TRIG", "CHOLHDL", "LDLDIRECT" in the last 72 hours. Thyroid Function Tests: No results for input(s): "TSH", "T4TOTAL", "FREET4", "T3FREE", "THYROIDAB" in the last 72 hours. Anemia Panel: Recent Labs    09/27/22 0520 09/28/22 0333   FERRITIN 327* 388*   Sepsis Labs: Recent Labs  Lab 09/27/22 1500  PROCALCITON <0.10    Recent Results (from the past 240 hour(s))  Resp Panel by RT-PCR (Flu A&B, Covid) Anterior Nasal Swab     Status: Abnormal   Collection Time: 09/26/22  9:47 AM   Specimen: Anterior Nasal Swab  Result Value Ref Range Status   SARS Coronavirus 2 by RT PCR POSITIVE (A) NEGATIVE Final    Comment: (NOTE) SARS-CoV-2 target nucleic acids are DETECTED.  The SARS-CoV-2 RNA is generally detectable in upper respiratory specimens during the acute phase of infection. Positive results are indicative of the presence of the identified virus, but do not rule out bacterial infection or co-infection with other pathogens not detected by the test. Clinical correlation with patient history and other diagnostic information is necessary to determine patient infection status. The expected result is Negative.  Fact Sheet for Patients: EntrepreneurPulse.com.au  Fact Sheet for Healthcare Providers:  IncredibleEmployment.be  This test is not yet approved or cleared by the [place] and  has been authorized for detection and/or diagnosis of SARS-CoV-2 by FDA under an Emergency Use Authorization (EUA).  This EUA will remain in effect (meaning this test can be used) for the duration of  the COVID-19 declaration under Section 564(b)(1) of the A ct, 21 U.S.C. section 360bbb-3(b)(1), unless the authorization is terminated or revoked sooner.     Influenza A by PCR NEGATIVE NEGATIVE Final   Influenza B by PCR NEGATIVE NEGATIVE Final    Comment: (NOTE) The Xpert Xpress SARS-CoV-2/FLU/RSV plus assay is intended as an aid in the diagnosis of influenza from Nasopharyngeal swab specimens and should not be used as a sole basis for treatment. Nasal washings and aspirates are unacceptable for Xpert Xpress SARS-CoV-2/FLU/RSV testing.  Fact Sheet for  Patients: EntrepreneurPulse.com.au  Fact Sheet for Healthcare Providers: IncredibleEmployment.be  This test is not yet approved or cleared by the [place] FDA and has been authorized for detection and/or diagnosis of SARS-CoV-2 by FDA under an Emergency Use Authorization (EUA). This EUA will remain in effect (meaning this test can be used) for the duration of the COVID-19 declaration under Section 564(b)(1) of the Act, 21 U.S.C. section 360bbb-3(b)(1), unless the authorization is terminated or revoked.  Performed at [facility], [place] [address], [place], [place] [address]     Radiology Studies: ECHOCARDIOGRAM COMPLETE  Result Date: 09/27/2022    ECHOCARDIOGRAM REPORT   Patient Name:   LAQUNDA GATT Date of Exam: 09/27/2022 Medical Rec #:  [id number]      Height:       64.0 in Accession #:    [id number]     Weight:       191.8 lb Date of Birth:  15-Nov-1933      BSA:          1.922 m Patient Age:    11 years       BP:           174/79 mmHg Patient Gender: F              HR:           61 bpm. Exam Location:  Inpatient Procedure: 2D Echo, Cardiac Doppler and Color Doppler Indications:    Dyspnea  History:        Patient has no prior history of Echocardiogram examinations.                 Arrythmias:Atrial Fibrillation. COVID +.  Sonographer:    Wenda Low Referring Phys: [phone] Ariyan Sinnett  Sonographer Comments: Image acquisition challenging due to respiratory motion. IMPRESSIONS  1. Left ventricular ejection fraction, by estimation, is 60 to 65%. The left ventricle has normal function. The left ventricle has no regional wall motion abnormalities. Left ventricular diastolic parameters were normal.  2. Right ventricular systolic function is normal. The right ventricular size is normal. There is moderately elevated pulmonary artery systolic pressure.  3. Left atrial size was moderately dilated.  4. The mitral valve is normal in  structure. Trivial mitral valve regurgitation. No evidence of mitral stenosis. Severe mitral annular calcification.  5. The aortic valve is tricuspid. There is moderate calcification of the aortic valve. Aortic valve regurgitation is not visualized. Aortic valve sclerosis/calcification is present, without any evidence of aortic stenosis.  6. The inferior vena cava is normal in size with greater than 50% respiratory variability, suggesting right atrial pressure of 3 mmHg. FINDINGS  Left Ventricle: Left ventricular ejection fraction, by estimation, is 60 to 65%. The left ventricle has normal function. The left ventricle has no regional wall motion abnormalities. The left ventricular internal cavity size was normal in size. There is  no left ventricular hypertrophy. Left ventricular diastolic parameters were normal. Right Ventricle: The right ventricular size is normal. No increase in right ventricular wall thickness. Right ventricular systolic function is normal. There is moderately elevated pulmonary artery systolic pressure. The tricuspid regurgitant velocity is 3.08 m/s, and with an assumed right atrial pressure of 8 mmHg, the estimated right ventricular systolic pressure is 123XX123 mmHg. Left Atrium: Left atrial size was moderately dilated. Right Atrium: Right atrial size was normal in size. Pericardium: There is no evidence of pericardial effusion. Mitral Valve: The mitral valve is normal in structure. There is moderate thickening of the mitral valve leaflet(s). There is moderate calcification of the mitral valve leaflet(s). Severe mitral annular calcification. Trivial mitral valve regurgitation. No evidence of mitral valve stenosis. MV peak gradient, 3.3 mmHg. The mean mitral valve gradient is 1.0 mmHg. Tricuspid Valve: The tricuspid valve is normal in structure. Tricuspid valve regurgitation is mild . No evidence of tricuspid stenosis. Aortic Valve: The aortic valve is tricuspid. There is moderate calcification of  the aortic valve. Aortic valve regurgitation is not visualized. Aortic valve sclerosis/calcification is present, without any evidence of aortic stenosis. Aortic valve mean gradient measures 3.0 mmHg. Aortic valve peak gradient measures 5.1 mmHg. Aortic valve area, by VTI measures 2.06 cm. Pulmonic Valve: The pulmonic valve was normal in structure. Pulmonic valve regurgitation is not visualized. No evidence of pulmonic stenosis. Aorta: The aortic root is normal in size and structure. Venous: The inferior vena cava is normal in size with greater than 50% respiratory variability, suggesting right atrial pressure of 3 mmHg. IAS/Shunts: No atrial level shunt detected by color flow Doppler.  LEFT VENTRICLE PLAX 2D LVIDd:         5.10 cm   Diastology LVIDs:         2.80 cm   LV e' medial:    9.25 cm/s LV PW:         1.00 cm   LV E/e' medial:  10.1 LV IVS:        1.00 cm   LV e' lateral:   10.20 cm/s LVOT diam:     1.90 cm   LV E/e' lateral: 9.1 LV SV:         62 LV SV Index:   32 LVOT Area:     2.84 cm  RIGHT VENTRICLE RV Basal diam:  3.75 cm RV Mid diam:    3.30 cm RV S prime:     16.80 cm/s TAPSE (M-mode): 2.3 cm LEFT ATRIUM              Index        RIGHT ATRIUM           Index LA diam:        4.50 cm  2.34 cm/m   RA Area:     21.20 cm LA Vol (A2C):   113.5 ml 59.06 ml/m  RA Volume:   64.60 ml  33.61 ml/m LA Vol (A4C):   108.0 ml 56.20 ml/m LA Biplane Vol: 111.0 ml 57.76 ml/m  AORTIC VALVE                    PULMONIC VALVE AV Area (Vmax):    2.18 cm  PV Vmax:       0.85 m/s AV Area (Vmean):   1.91 cm     PV Peak grad:  2.9 mmHg AV Area (VTI):     2.06 cm AV Vmax:           113.00 cm/s AV Vmean:          79.900 cm/s AV VTI:            0.303 m AV Peak Grad:      5.1 mmHg AV Mean Grad:      3.0 mmHg LVOT Vmax:         86.80 cm/s LVOT Vmean:        53.900 cm/s LVOT VTI:          0.220 m LVOT/AV VTI ratio: 0.73  AORTA Ao Root diam: 3.70 cm MITRAL VALVE               TRICUSPID VALVE MV Area (PHT): 3.12 cm    TR  Peak grad:   37.9 mmHg MV Area VTI:   2.24 cm    TR Vmax:        308.00 cm/s MV Peak grad:  3.3 mmHg MV Mean grad:  1.0 mmHg    SHUNTS MV Vmax:       0.91 m/s    Systemic VTI:  0.22 m MV Vmean:      55.4 cm/s   Systemic Diam: 1.90 cm MV Decel Time: 243 msec MV E velocity: 93.00 cm/s MV A velocity: 72.80 cm/s MV E/A ratio:  1.28 Charlton Haws MD Electronically signed by Charlton Haws MD Signature Date/Time: 09/27/2022/4:14:48 PM    Final      Scheduled Meds:  acetaminophen  1,000 mg Oral Q8H   amLODipine  10 mg Oral QPM   apixaban  5 mg Oral BID   vitamin C  1,000 mg Oral Daily   cholecalciferol  5,000 Units Oral Daily   cyanocobalamin  1,000 mcg Oral QODAY   dexamethasone  6 mg Oral Daily   docusate sodium  100 mg Oral BID   gabapentin  300 mg Oral Once   gabapentin  300 mg Oral Daily   And   gabapentin  600 mg Oral q1600   mirtazapine  15 mg Oral QHS   molnupiravir EUA  4 capsule Oral BID   pantoprazole  40 mg Oral Daily   spironolactone  25 mg Oral Daily   Continuous Infusions:   LOS: 2 days    Time spent: 35 mins    Diannia Hogenson, MD [facility]   If 7PM-7AM, please contact night-coverage

## 2022-09-28 NOTE — TOC Initial Note (Signed)
Transition of Care [facility]) - Initial/Assessment Note    Patient Details  Name: [name] MRN: [id number] Date of Birth: November 12, 1933  Transition of Care [facility]) CM/SW Contact:    Golda Acre, RN Phone Number: 09/28/2022, 7:35 AM  Clinical Narrative:                  Transition of Care [facility]) Screening Note   Patient Details  Name: [name] Date of Birth: [birth year]   Transition of Care [facility]) CM/SW Contact:    Golda Acre, RN Phone Number: 09/28/2022, 7:35 AM    Transition of Care Department [facility]) has reviewed patient and no TOC needs have been identified at this time. We will continue to monitor patient advancement through interdisciplinary progression rounds. If new patient transition needs arise, please place a TOC consult.    Expected Discharge Plan: Home/Self Care Barriers to Discharge: Continued Medical Work up   Patient Goals and CMS Choice Patient states their goals for this hospitalization and ongoing recovery are:: not stated CMS Medicare.gov Compare Post Acute Care list provided to:: Patient Choice offered to / list presented to : Patient  Expected Discharge Plan and Services Expected Discharge Plan: Home/Self Care   Discharge Planning Services: CM Consult   Living arrangements for the past 2 months: Single Family Home                                      Prior Living Arrangements/Services Living arrangements for the past 2 months: Single Family Home Lives with:: Self Patient language and need for interpreter reviewed:: Yes Do you feel safe going back to the place where you live?: Yes            Criminal Activity/Legal Involvement Pertinent to Current Situation/Hospitalization: No - Comment as needed  Activities of Daily Living Home Assistive Devices/Equipment: Eyeglasses, Built-in shower seat, Hearing aid ADL Screening (condition at time of admission) Patient's cognitive ability adequate to safely complete daily  activities?: Yes Is the patient deaf or have difficulty hearing?: Yes (BL hearing aids) Does the patient have difficulty seeing, even when wearing glasses/contacts?: No Does the patient have difficulty concentrating, remembering, or making decisions?: No Patient able to express need for assistance with ADLs?: Yes Does the patient have difficulty dressing or bathing?: Yes Independently performs ADLs?: No Communication: Independent Dressing (OT): Needs assistance Is this a change from baseline?: Change from baseline, expected to last >3 days Grooming: Needs assistance Is this a change from baseline?: Change from baseline, expected to last >3 days Feeding: Independent Bathing: Needs assistance Is this a change from baseline?: Pre-admission baseline Toileting: Needs assistance Is this a change from baseline?: Change from baseline, expected to last >3days In/Out Bed: Independent with device (comment) Dan Humphreys w wheels) Walks in Home: Independent with device (comment) Dan Humphreys w wheels) Does the patient have difficulty walking or climbing stairs?: Yes Weakness of Legs: Both Weakness of Arms/Hands: None  Permission Sought/Granted                  Emotional Assessment Appearance:: Appears stated age Attitude/Demeanor/Rapport: Engaged Affect (typically observed): Calm Orientation: : Oriented to Self, Oriented to Place, Oriented to  Time, Oriented to Situation Alcohol / Substance Use: Not Applicable Psych Involvement: No (comment)  Admission diagnosis:  Hypoxia [R09.02] Pneumonia due to COVID-19 virus [U07.1, J12.82] COVID-19 [U07.1] Patient Active Problem List   Diagnosis Date Noted   Pneumonia due  to COVID-19 virus 09/26/2022   Overweight (BMI 25.0-29.9) 09/14/2020   S/P left THA, 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

## 2022-09-28 NOTE — Progress Notes (Signed)
Educated patient about proning and lying on side if possible.  Pt stated she is unable to lay on her stomach and/or left side but will try to turn to her right at times.  Using the incentive spirometer and flutter valve appropriately without staff direction.

## 2022-09-29 LAB — COMPREHENSIVE METABOLIC PANEL
ALT: 19 U/L (ref 0–44)
AST: 18 U/L (ref 15–41)
Albumin: 3 g/dL — ABNORMAL LOW (ref 3.5–5.0)
Alkaline Phosphatase: 49 U/L (ref 38–126)
Anion gap: 8 (ref 5–15)
BUN: 14 mg/dL (ref 8–23)
CO2: 26 mmol/L (ref 22–32)
Calcium: 8.9 mg/dL (ref 8.9–10.3)
Chloride: 101 mmol/L (ref 98–111)
Creatinine, Ser: 0.4 mg/dL — ABNORMAL LOW (ref 0.44–1.00)
GFR, Estimated: >60 mL/min (ref >60)
Glucose, Bld: 112 mg/dL — ABNORMAL HIGH (ref 70–99)
Potassium: 4.5 mmol/L (ref 3.5–5.1)
Sodium: 135 mmol/L (ref 135–145)
Total Bilirubin: 0.4 mg/dL (ref 0.3–1.2)
Total Protein: 5.9 g/dL — ABNORMAL LOW (ref 6.5–8.1)

## 2022-09-29 LAB — CBC WITH DIFFERENTIAL/PLATELET
Abs Immature Granulocytes: 0.07 10*3/uL (ref 0.00–0.07)
Basophils Absolute: 0 10*3/uL (ref 0.0–0.1)
Basophils Relative: 0 %
Eosinophils Absolute: 0 10*3/uL (ref 0.0–0.5)
Eosinophils Relative: 0 %
HCT: 37.7 % (ref 36.0–46.0)
Hemoglobin: 12.3 g/dL (ref 12.0–15.0)
Immature Granulocytes: 1 %
Lymphocytes Relative: 13 %
Lymphs Abs: 1.6 10*3/uL (ref 0.7–4.0)
MCH: 27.8 pg (ref 26.0–34.0)
MCHC: 32.6 g/dL (ref 30.0–36.0)
MCV: 85.1 fL (ref 80.0–100.0)
Monocytes Absolute: 1 10*3/uL (ref 0.1–1.0)
Monocytes Relative: 8 %
Neutro Abs: 9.7 10*3/uL — ABNORMAL HIGH (ref 1.7–7.7)
Neutrophils Relative %: 78 %
Platelets: 203 10*3/uL (ref 150–400)
RBC: 4.43 MIL/uL (ref 3.87–5.11)
RDW: 13.5 % (ref 11.5–15.5)
WBC: 12.4 10*3/uL — ABNORMAL HIGH (ref 4.0–10.5)
nRBC: 0 % (ref 0.0–0.2)

## 2022-09-29 LAB — C-REACTIVE PROTEIN: CRP: 3.1 mg/dL — ABNORMAL HIGH (ref <1.0)

## 2022-09-29 LAB — MAGNESIUM: Magnesium: 2 mg/dL (ref 1.7–2.4)

## 2022-09-29 LAB — FERRITIN: Ferritin: 366 ng/mL — ABNORMAL HIGH (ref 11–307)

## 2022-09-29 LAB — PHOSPHORUS: Phosphorus: 2.7 mg/dL (ref 2.5–4.6)

## 2022-09-29 MED ORDER — SENNA 8.6 MG PO TABS
1.0000 | ORAL_TABLET | Freq: Every day | ORAL | Status: DC
  Administered 2022-09-29 – 2022-10-01 (×3): 8.6 mg via ORAL
  Filled 2022-09-29 (×3): qty 1

## 2022-09-29 MED ORDER — POLYETHYLENE GLYCOL 3350 17 G PO PACK
17.0000 g | PACK | Freq: Every day | ORAL | Status: DC
  Administered 2022-09-29 – 2022-09-30 (×2): 17 g via ORAL
  Filled 2022-09-29 (×3): qty 1

## 2022-09-29 MED ORDER — SENNA 8.6 MG PO TABS
1.0000 | ORAL_TABLET | Freq: Every day | ORAL | Status: DC | PRN
  Administered 2022-09-29: 8.6 mg via ORAL
  Filled 2022-09-29: qty 1

## 2022-09-29 NOTE — Progress Notes (Signed)
PROGRESS NOTE    Mariani Seering  HYI:[id number] DOB: May 10, 1933 DOA: 09/26/2022 PCP: Hadley Pen, MD   Brief Narrative:  This 88 years old female with PMH significant for COPD not on home oxygen, atrial fibrillation on Eliquis, arthritis, hypertension, hyperlipidemia, GERD presented to the ED with C/O: Nonproductive cough associated with chest congestion and body ache for last 2 to 3 days.  Patient describes cough is dry associated with congestion. She denies sick contacts,  recent travel. She has COPD and  does not use oxygen at baseline. She has been using inhalers without any improvement.She is up-to-date with COVID vaccination including booster.  Chest x-ray shows evidence of chronic lung disease.  Patient is admitted for COVID+ pneumonia.  Assessment & Plan:   Principal Problem:   Pneumonia due to COVID-19 virus Active Problems:   Paroxysmal atrial fibrillation (HCC)   S/P left THA, posterior   Overweight (BMI 25.0-29.9)  Acute hypoxic respiratory failure: COVID+ infection. Patient presented with cough, chest congestion, chest pain and generalized body ache. She was found to be hypoxic with SPO2 87% on room air, requiring 2 L of supplemental oxygen. Continue supplemental oxygen and wean as tolerated. Continue airborne and droplet precautions. Continue Decadron 6 mg daily for 10 days Continue  molnapuravir  for 5 days. Continue to monitor inflammatory markers.   COPD: Continue home inhalers. At baseline she does not use oxygen. Continue supplemental oxygen and wean as tolerated   Paroxysmal A-fib: Heart rate reasonably controlled. Continue Cardizem and Eliquis.   Hypertension Continue Cardizem, Aldactone, lisinopril. Resume labetalol, BP well controlled.   Major depression: Continue Remeron.   Chronic diastolic CHF.: Last echo 9/21  shows EF 55 to 60%. Received Lasix 40 mg IV x 2 2D echo shows LVEF 55 to 60% no RWMA Reassess need for Lasix.    GERD: Continue pantoprazole 40 mg daily   Arthritis: Continue gabapentin and Robaxin.   DVT prophylaxis: Eliquis Code Status: Full code Family Communication: Daughter at bed side Disposition Plan:   Status is: Inpatient Remains inpatient appropriate because:Admitted  for COVID-pneumonia, started on molpuranivir  Anticipated discharge home with home health services 09/30/2022.   Consultants:  None  Procedures: None  Antimicrobials:  Anti-infectives (From admission, onward)    Start     Dose/Rate Route Frequency Ordered Stop   09/26/22 2200  molnupiravir EUA (LAGEVRIO) capsule 800 mg        4 capsule Oral 2 times daily 09/26/22 2056 10/01/22 2159   09/26/22 1600  molnupiravir EUA (LAGEVRIO) capsule 800 mg  Status:  Discontinued        4 capsule Oral 2 times daily 09/26/22 1403 09/26/22 2056   09/26/22 1245  nirmatrelvir/ritonavir EUA (renal dosing) (PAXLOVID) 2 tablet  Status:  Discontinued        2 tablet Oral 2 times daily 09/26/22 1236 09/26/22 1403       Subjective: Patient was seen and examined at bedside. Overnight events noted. Patient reports feeling much better,  states still has worsening cough.  She remains on 2 L O2.  Objective: Vitals:   09/28/22 2122 09/28/22 2140 09/29/22 0506 09/29/22 0507  BP:  (!) 146/113 133/73   Pulse: 93 93 (!) 121 98  Resp:   17   Temp:   98.1 F (36.7 C)   TempSrc:   Oral   SpO2: 98%  96% 97%  Weight:      Height:        Intake/Output Summary (Last 24 hours) at 09/29/2022  [place] filed at 09/29/2022 0616 Gross per 24 hour  Intake --  Output 2400 ml  Net -2400 ml   Filed Weights   09/26/22 0919  Weight: 87 kg    Examination:  General exam: Appears comfortable, not in any acute distress.  Deconditioned. Respiratory system: CTA bilaterally, respiratory effort normal, RR 13 Cardiovascular system: S1-S2 heard, regular rate and rhythm, no murmur. Gastrointestinal system: Abdomen is soft, non tender, non  distended, BS+ Central nervous system: Alert and oriented x 3, no focal neurological deficits. Extremities: No edema , no cyanosis, no clubbing Skin: No rashes, lesions or ulcers Psychiatry: Judgement and insight appear normal. Mood & affect appropriate.     Data Reviewed: I have personally reviewed following labs and imaging studies  CBC: Recent Labs  Lab 09/26/22 0947 09/27/22 0520 09/28/22 0333 09/29/22 0449  WBC 8.8 10.7* 14.6* 12.4*  NEUTROABS 7.4 9.1* 12.2* 9.7*  HGB 12.1 11.6* 12.8 12.3  HCT 38.5 36.4 39.1 37.7  MCV 87.9 87.1 86.1 85.1  PLT 174 176 204 [id number]   Basic Metabolic Panel: Recent Labs  Lab 09/26/22 0947 09/27/22 0520 09/28/22 0333 09/29/22 0449  NA 136 136 133* 135  K 4.0 4.3 4.4 4.5  CL 108 104 99 101  CO2 24 25 25 26   GLUCOSE 117* 151* 129* 112*  BUN 12 17 19 14   CREATININE 0.72 0.70 0.68 0.40*  CALCIUM 8.6* 8.7* 9.0 8.9  MG  --  1.7 1.7 2.0  PHOS  --  4.0 3.2 2.7   GFR: Estimated Creatinine Clearance: 52.9 mL/min (A) (by C-G formula based on SCr of 0.4 mg/dL (L)). Liver Function Tests: Recent Labs  Lab 09/26/22 0947 09/27/22 0520 09/28/22 0333 09/29/22 0449  AST 22 19 20 18   ALT 21 19 18 19   ALKPHOS 63 52 58 49  BILITOT 0.5 0.4 0.3 0.4  PROT 6.4* 5.8* 6.7 5.9*  ALBUMIN 3.5 3.0* 3.2* 3.0*   Recent Labs  Lab 09/26/22 0947  LIPASE 35   No results for input(s): "AMMONIA" in the last 168 hours. Coagulation Profile: No results for input(s): "INR", "PROTIME" in the last 168 hours. Cardiac Enzymes: No results for input(s): "CKTOTAL", "CKMB", "CKMBINDEX", "TROPONINI" in the last 168 hours. BNP (last 3 results) No results for input(s): "PROBNP" in the last 8760 hours. HbA1C: No results for input(s): "HGBA1C" in the last 72 hours. CBG: No results for input(s): "GLUCAP" in the last 168 hours. Lipid Profile: No results for input(s): "CHOL", "HDL", "LDLCALC", "TRIG", "CHOLHDL", "LDLDIRECT" in the last 72 hours. Thyroid Function  Tests: No results for input(s): "TSH", "T4TOTAL", "FREET4", "T3FREE", "THYROIDAB" in the last 72 hours. Anemia Panel: Recent Labs    09/28/22 0333 09/29/22 0449  FERRITIN 388* 366*   Sepsis Labs: Recent Labs  Lab 09/27/22 1500  PROCALCITON <0.10    Recent Results (from the past 240 hour(s))  Resp Panel by RT-PCR (Flu A&B, Covid) Anterior Nasal Swab     Status: Abnormal   Collection Time: 09/26/22  9:47 AM   Specimen: Anterior Nasal Swab  Result Value Ref Range Status   SARS Coronavirus 2 by RT PCR POSITIVE (A) NEGATIVE Final    Comment: (NOTE) SARS-CoV-2 target nucleic acids are DETECTED.  The SARS-CoV-2 RNA is generally detectable in upper respiratory specimens during the acute phase of infection. Positive results are indicative of the presence of the identified virus, but do not rule out bacterial infection or co-infection with other pathogens not detected by the test. Clinical correlation with  patient history and other diagnostic information is necessary to determine patient infection status. The expected result is Negative.  Fact Sheet for Patients: EntrepreneurPulse.com.au  Fact Sheet for Healthcare Providers: IncredibleEmployment.be  This test is not yet approved or cleared by the [place] FDA and  has been authorized for detection and/or diagnosis of SARS-CoV-2 by FDA under an Emergency Use Authorization (EUA).  This EUA will remain in effect (meaning this test can be used) for the duration of  the COVID-19 declaration under Section 564(b)(1) of the A ct, 21 U.S.C. section 360bbb-3(b)(1), unless the authorization is terminated or revoked sooner.     Influenza A by PCR NEGATIVE NEGATIVE Final   Influenza B by PCR NEGATIVE NEGATIVE Final    Comment: (NOTE) The Xpert Xpress SARS-CoV-2/FLU/RSV plus assay is intended as an aid in the diagnosis of influenza from Nasopharyngeal swab specimens and should not be used as a sole  basis for treatment. Nasal washings and aspirates are unacceptable for Xpert Xpress SARS-CoV-2/FLU/RSV testing.  Fact Sheet for Patients: EntrepreneurPulse.com.au  Fact Sheet for Healthcare Providers: IncredibleEmployment.be  This test is not yet approved or cleared by the [place] FDA and has been authorized for detection and/or diagnosis of SARS-CoV-2 by FDA under an Emergency Use Authorization (EUA). This EUA will remain in effect (meaning this test can be used) for the duration of the COVID-19 declaration under Section 564(b)(1) of the Act, 21 U.S.C. section 360bbb-3(b)(1), unless the authorization is terminated or revoked.  Performed at [facility], [place] [address] [place], [place] [address]     Radiology Studies: ECHOCARDIOGRAM COMPLETE  Result Date: 09/27/2022    ECHOCARDIOGRAM REPORT   Patient Name:   DEONDREA REUM Date of Exam: 09/27/2022 Medical Rec #:  [id number]      Height:       64.0 in Accession #:    [id number]     Weight:       191.8 lb Date of Birth:  12-08-1932      BSA:          1.922 m Patient Age:    41 years       BP:           174/79 mmHg Patient Gender: F              HR:           61 bpm. Exam Location:  Inpatient Procedure: 2D Echo, Cardiac Doppler and Color Doppler Indications:    Dyspnea  History:        Patient has no prior history of Echocardiogram examinations.                 Arrythmias:Atrial Fibrillation. COVID +.  Sonographer:    Wenda Low Referring Phys: [phone] Keygan Dumond  Sonographer Comments: Image acquisition challenging due to respiratory motion. IMPRESSIONS  1. Left ventricular ejection fraction, by estimation, is 60 to 65%. The left ventricle has normal function. The left ventricle has no regional wall motion abnormalities. Left ventricular diastolic parameters were normal.  2. Right ventricular systolic function is normal. The right ventricular size is normal. There is moderately  elevated pulmonary artery systolic pressure.  3. Left atrial size was moderately dilated.  4. The mitral valve is normal in structure. Trivial mitral valve regurgitation. No evidence of mitral stenosis. Severe mitral annular calcification.  5. The aortic valve is tricuspid. There is moderate calcification of the aortic valve. Aortic valve regurgitation is not visualized. Aortic valve sclerosis/calcification is present,  without any evidence of aortic stenosis.  6. The inferior vena cava is normal in size with greater than 50% respiratory variability, suggesting right atrial pressure of 3 mmHg. FINDINGS  Left Ventricle: Left ventricular ejection fraction, by estimation, is 60 to 65%. The left ventricle has normal function. The left ventricle has no regional wall motion abnormalities. The left ventricular internal cavity size was normal in size. There is  no left ventricular hypertrophy. Left ventricular diastolic parameters were normal. Right Ventricle: The right ventricular size is normal. No increase in right ventricular wall thickness. Right ventricular systolic function is normal. There is moderately elevated pulmonary artery systolic pressure. The tricuspid regurgitant velocity is 3.08 m/s, and with an assumed right atrial pressure of 8 mmHg, the estimated right ventricular systolic pressure is 45.9 mmHg. Left Atrium: Left atrial size was moderately dilated. Right Atrium: Right atrial size was normal in size. Pericardium: There is no evidence of pericardial effusion. Mitral Valve: The mitral valve is normal in structure. There is moderate thickening of the mitral valve leaflet(s). There is moderate calcification of the mitral valve leaflet(s). Severe mitral annular calcification. Trivial mitral valve regurgitation. No evidence of mitral valve stenosis. MV peak gradient, 3.3 mmHg. The mean mitral valve gradient is 1.0 mmHg. Tricuspid Valve: The tricuspid valve is normal in structure. Tricuspid valve regurgitation  is mild . No evidence of tricuspid stenosis. Aortic Valve: The aortic valve is tricuspid. There is moderate calcification of the aortic valve. Aortic valve regurgitation is not visualized. Aortic valve sclerosis/calcification is present, without any evidence of aortic stenosis. Aortic valve mean gradient measures 3.0 mmHg. Aortic valve peak gradient measures 5.1 mmHg. Aortic valve area, by VTI measures 2.06 cm. Pulmonic Valve: The pulmonic valve was normal in structure. Pulmonic valve regurgitation is not visualized. No evidence of pulmonic stenosis. Aorta: The aortic root is normal in size and structure. Venous: The inferior vena cava is normal in size with greater than 50% respiratory variability, suggesting right atrial pressure of 3 mmHg. IAS/Shunts: No atrial level shunt detected by color flow Doppler.  LEFT VENTRICLE PLAX 2D LVIDd:         5.10 cm   Diastology LVIDs:         2.80 cm   LV e' medial:    9.25 cm/s LV PW:         1.00 cm   LV E/e' medial:  10.1 LV IVS:        1.00 cm   LV e' lateral:   10.20 cm/s LVOT diam:     1.90 cm   LV E/e' lateral: 9.1 LV SV:         62 LV SV Index:   32 LVOT Area:     2.84 cm  RIGHT VENTRICLE RV Basal diam:  3.75 cm RV Mid diam:    3.30 cm RV S prime:     16.80 cm/s TAPSE (M-mode): 2.3 cm LEFT ATRIUM              Index        RIGHT ATRIUM           Index LA diam:        4.50 cm  2.34 cm/m   RA Area:     21.20 cm LA Vol (A2C):   113.5 ml 59.06 ml/m  RA Volume:   64.60 ml  33.61 ml/m LA Vol (A4C):   108.0 ml 56.20 ml/m LA Biplane Vol: 111.0 ml 57.76 ml/m  AORTIC VALVE  PULMONIC VALVE AV Area (Vmax):    2.18 cm     PV Vmax:       0.85 m/s AV Area (Vmean):   1.91 cm     PV Peak grad:  2.9 mmHg AV Area (VTI):     2.06 cm AV Vmax:           113.00 cm/s AV Vmean:          79.900 cm/s AV VTI:            0.303 m AV Peak Grad:      5.1 mmHg AV Mean Grad:      3.0 mmHg LVOT Vmax:         86.80 cm/s LVOT Vmean:        53.900 cm/s LVOT VTI:          0.220 m  LVOT/AV VTI ratio: 0.73  AORTA Ao Root diam: 3.70 cm MITRAL VALVE               TRICUSPID VALVE MV Area (PHT): 3.12 cm    TR Peak grad:   37.9 mmHg MV Area VTI:   2.24 cm    TR Vmax:        308.00 cm/s MV Peak grad:  3.3 mmHg MV Mean grad:  1.0 mmHg    SHUNTS MV Vmax:       0.91 m/s    Systemic VTI:  0.22 m MV Vmean:      55.4 cm/s   Systemic Diam: 1.90 cm MV Decel Time: 243 msec MV E velocity: 93.00 cm/s MV A velocity: 72.80 cm/s MV E/A ratio:  1.28 Charlton Haws MD Electronically signed by Charlton Haws MD Signature Date/Time: 09/27/2022/4:14:48 PM    Final      Scheduled Meds:  acetaminophen  1,000 mg Oral Q8H   amLODipine  10 mg Oral QPM   apixaban  5 mg Oral BID   vitamin C  1,000 mg Oral Daily   benazepril  20 mg Oral Daily   cholecalciferol  5,000 Units Oral Daily   cyanocobalamin  1,000 mcg Oral QODAY   dexamethasone  6 mg Oral Daily   docusate sodium  100 mg Oral BID   gabapentin  300 mg Oral Once   gabapentin  300 mg Oral Daily   And   gabapentin  600 mg Oral q1600   labetalol  200 mg Oral BID   mirtazapine  15 mg Oral QHS   molnupiravir EUA  4 capsule Oral BID   pantoprazole  40 mg Oral Daily   spironolactone  25 mg Oral Daily   Continuous Infusions:   LOS: 3 days    Time spent: 35 mins    Yareliz Thorstenson, MD [facility]   If 7PM-7AM, please contact night-coverage

## 2022-09-29 NOTE — TOC Progression Note (Signed)
Transition of Care [facility]) - Progression Note    Patient Details  Name: Amanee Iacovelli MRN: [id number] Date of Birth: [birth year]  [facility] CM/SW Contact  Adrian Prows, RN Phone Number: 09/29/2022, 4:08 PM  Clinical Narrative:    PT eval recc HHPT; pt remains on O2; pt not ready for d/c; will arrange HHPT.   Expected Discharge Plan: Home/Self Care Barriers to Discharge: Continued Medical Work up  Expected Discharge Plan and Services Expected Discharge Plan: Home/Self Care   Discharge Planning Services: CM Consult   Living arrangements for the past 2 months: Single Family Home                                       Social Determinants of Health (SDOH) Interventions    Readmission Risk Interventions     No data to display

## 2022-09-29 NOTE — Evaluation (Signed)
Occupational Therapy Evaluation Patient Details Name: [name] MRN: [id number] DOB: 03/05/1933 Today's Date: 09/29/2022   History of Present Illness Patient is a 88 year old female who presented to the ED with body aches, nonproductive cough and chest congestion. Patient was found to have COVID pneumonia, chest x-ray was also positive for chronic lung disease. PMH: COPD, paroxymal a fib, HTN, GERD, hyperlipidemia   Clinical Impression   Patient is a 88 year old female who was admitted for above. Patient was living at home with daughter and son in law support.patient was noted to be quick to fatigue with HR ranging up to 132bpm with minimal activity.  Patient was noted to have decreased functional activity tolerance, decreased endurance, decreased standing balance, decreased safety awareness, and decreased knowledge of AD/AE impacting participation in ADLs. Patient would continue to benefit from skilled OT services at this time while admitted and after d/c to address noted deficits in order to improve overall safety and independence in ADLs.        Recommendations for follow up therapy are one component of a multi-disciplinary discharge planning process, led by the attending physician.  Recommendations may be updated based on patient status, additional functional criteria and insurance authorization.   Follow Up Recommendations  Skilled nursing-short term rehab (<3 hours/day) (v.s HH pending family support.)    Assistance Recommended at Discharge Frequent or constant Supervision/Assistance  Patient can return home with the following A little help with walking and/or transfers;Assistance with cooking/housework;Direct supervision/assist for medications management;Help with stairs or ramp for entrance;Direct supervision/assist for financial management;Assist for transportation;A little help with bathing/dressing/bathroom    Functional Status Assessment  Patient has had a recent decline in  their functional status and demonstrates the ability to make significant improvements in function in a reasonable and predictable amount of time.  Equipment Recommendations  BSC/3in1    Recommendations for Other Services       Precautions / Restrictions Precautions Precautions: Fall Restrictions Weight Bearing Restrictions: No      Mobility Bed Mobility Overal bed mobility: Needs Assistance Bed Mobility: Supine to Sit     Supine to sit: Min assist     General bed mobility comments: assist to bring BLE to EOB with increased time    Transfers Overall transfer level: Needs assistance Equipment used: Rolling walker (2 wheels) Transfers: Sit to/from Stand, Bed to chair/wheelchair/BSC Sit to Stand: Min assist           General transfer comment: cues for hand placement and small steps over to recliner, remained on 2L O2 [place], SPO2 98%      Balance Overall balance assessment: Needs assistance         Standing balance support: Bilateral upper extremity supported, Reliant on assistive device for balance Standing balance-Leahy Scale: Poor                             ADL either performed or assessed with clinical judgement   ADL Overall ADL's : Needs assistance/impaired Eating/Feeding: Set up;Sitting   Grooming: Set up;Wash/dry face;Wash/dry hands;Sitting Grooming Details (indicate cue type and reason): in recliner Upper Body Bathing: Minimal assistance;Sitting   Lower Body Bathing: Maximal assistance;Bed level   Upper Body Dressing : Minimal assistance;Sitting   Lower Body Dressing: Sit to/from stand;Sitting/lateral leans;Maximal assistance Lower Body Dressing Details (indicate cue type and reason): patient attempted to don socks long sitting in bed with increased time with ability to bring LLE into lap but  unable to bring RLE into lap with edema in BLE noted. Toilet Transfer: Minimal assistance;Ambulation;Rolling walker (2 wheels) Toilet Transfer  Details (indicate cue type and reason): with increased time to recliner in room. HR noted to increase to 132 bpm with minimal activity. Toileting- Clothing Manipulation and Hygiene: Sitting/lateral lean;Sit to/from stand;Maximal assistance       Functional mobility during ADLs: Minimal assistance General ADL Comments: to transfer to recliner in room with increased time     Vision Baseline Vision/History: 1 Wears glasses Patient Visual Report: No change from baseline       Perception     Praxis      Pertinent Vitals/Pain Pain Assessment Pain Assessment: No/denies pain     Hand Dominance Right   Extremity/Trunk Assessment Upper Extremity Assessment Upper Extremity Assessment: Generalized weakness   Lower Extremity Assessment Lower Extremity Assessment: Defer to PT evaluation   Cervical / Trunk Assessment Cervical / Trunk Assessment: Normal   Communication Communication Communication: HOH   Cognition Arousal/Alertness: Awake/alert Behavior During Therapy: WFL for tasks assessed/performed Overall Cognitive Status: Within Functional Limits for tasks assessed                                 General Comments: fatigued but agreeable to participate.     General Comments       Exercises     Shoulder Instructions      Home Living Family/patient expects to be discharged to:: Private residence Living Arrangements: Children Available Help at Discharge: Family Type of Home: House Home Access: Stairs to enter Technical brewer of Steps: 1   Home Layout: Able to live on main level with bedroom/bathroom               Home Equipment: Conservation officer, nature (2 wheels)          Prior Functioning/Environment Prior Level of Function : Independent/Modified Independent             Mobility Comments: uses RW ADLs Comments: daughter supervises shower due to small step over to enter        OT Problem List: Impaired balance (sitting and/or  standing);Decreased safety awareness;Decreased knowledge of precautions;Decreased knowledge of use of DME or AE;Cardiopulmonary status limiting activity      OT Treatment/Interventions: Self-care/ADL training;Energy conservation;Balance training;Therapeutic exercise;Neuromuscular education;DME and/or AE instruction;Therapeutic activities;Patient/family education    OT Goals(Current goals can be found in the care plan section) Acute Rehab OT Goals Patient Stated Goal: to go home OT Goal Formulation: With patient Time For Goal Achievement: 10/13/22 Potential to Achieve Goals: Fair  OT Frequency: Min 2X/week    Co-evaluation              AM-PAC OT "6 Clicks" Daily Activity     Outcome Measure Help from another person eating meals?: A Little Help from another person taking care of personal grooming?: A Little Help from another person toileting, which includes using toliet, bedpan, or urinal?: A Lot Help from another person bathing (including washing, rinsing, drying)?: A Lot Help from another person to put on and taking off regular upper body clothing?: A Little Help from another person to put on and taking off regular lower body clothing?: A Lot 6 Click Score: 15   End of Session Equipment Utilized During Treatment: Rolling walker (2 wheels) Nurse Communication: Mobility status  Activity Tolerance: Other (comment);Patient tolerated treatment well (afib up to 132 bpm with activity) Patient left: in chair;with  call bell/phone within reach;with nursing/sitter in room (NT in room)  OT Visit Diagnosis: Unsteadiness on feet (R26.81);Other abnormalities of gait and mobility (R26.89)                Time: [id number] OT Time Calculation (min): 17 min Charges:  OT General Charges $OT Visit: 1 Visit OT Evaluation $OT Eval Moderate Complexity: 1 Mod  Raeley Gilmore OTR/L, MS Acute Rehabilitation Department Office# [phone]   Feliz Beam Blondine Hottel 09/29/2022, 5:00 PM

## 2022-09-30 ENCOUNTER — Inpatient Hospital Stay (HOSPITAL_COMMUNITY): Payer: Medicare Other

## 2022-09-30 LAB — CBC WITH DIFFERENTIAL/PLATELET
Abs Immature Granulocytes: 0.12 10*3/uL — ABNORMAL HIGH (ref 0.00–0.07)
Basophils Absolute: 0 10*3/uL (ref 0.0–0.1)
Basophils Relative: 0 %
Eosinophils Absolute: 0 10*3/uL (ref 0.0–0.5)
Eosinophils Relative: 0 %
HCT: 39.8 % (ref 36.0–46.0)
Hemoglobin: 12.7 g/dL (ref 12.0–15.0)
Immature Granulocytes: 1 %
Lymphocytes Relative: 12 %
Lymphs Abs: 1.4 10*3/uL (ref 0.7–4.0)
MCH: 27.9 pg (ref 26.0–34.0)
MCHC: 31.9 g/dL (ref 30.0–36.0)
MCV: 87.5 fL (ref 80.0–100.0)
Monocytes Absolute: 1 10*3/uL (ref 0.1–1.0)
Monocytes Relative: 8 %
Neutro Abs: 9.2 10*3/uL — ABNORMAL HIGH (ref 1.7–7.7)
Neutrophils Relative %: 79 %
Platelets: 193 10*3/uL (ref 150–400)
RBC: 4.55 MIL/uL (ref 3.87–5.11)
RDW: 13.6 % (ref 11.5–15.5)
WBC: 11.7 10*3/uL — ABNORMAL HIGH (ref 4.0–10.5)
nRBC: 0 % (ref 0.0–0.2)

## 2022-09-30 LAB — COMPREHENSIVE METABOLIC PANEL
ALT: 20 U/L (ref 0–44)
AST: 16 U/L (ref 15–41)
Albumin: 2.9 g/dL — ABNORMAL LOW (ref 3.5–5.0)
Alkaline Phosphatase: 51 U/L (ref 38–126)
Anion gap: 10 (ref 5–15)
BUN: 18 mg/dL (ref 8–23)
CO2: 26 mmol/L (ref 22–32)
Calcium: 9 mg/dL (ref 8.9–10.3)
Chloride: 98 mmol/L (ref 98–111)
Creatinine, Ser: 0.52 mg/dL (ref 0.44–1.00)
GFR, Estimated: >60 mL/min (ref >60)
Glucose, Bld: 126 mg/dL — ABNORMAL HIGH (ref 70–99)
Potassium: 4.5 mmol/L (ref 3.5–5.1)
Sodium: 134 mmol/L — ABNORMAL LOW (ref 135–145)
Total Bilirubin: 0.4 mg/dL (ref 0.3–1.2)
Total Protein: 6.1 g/dL — ABNORMAL LOW (ref 6.5–8.1)

## 2022-09-30 LAB — FERRITIN: Ferritin: 433 ng/mL — ABNORMAL HIGH (ref 11–307)

## 2022-09-30 LAB — MAGNESIUM: Magnesium: 2 mg/dL (ref 1.7–2.4)

## 2022-09-30 LAB — C-REACTIVE PROTEIN: CRP: 7.3 mg/dL — ABNORMAL HIGH (ref <1.0)

## 2022-09-30 LAB — PHOSPHORUS: Phosphorus: 2.8 mg/dL (ref 2.5–4.6)

## 2022-09-30 MED ORDER — SUCRALFATE 1 G PO TABS
1.0000 g | ORAL_TABLET | Freq: Three times a day (TID) | ORAL | Status: DC
  Administered 2022-09-30 – 2022-10-02 (×7): 1 g via ORAL
  Filled 2022-09-30 (×7): qty 1

## 2022-09-30 MED ORDER — METHYLPREDNISOLONE SODIUM SUCC 40 MG IJ SOLR
40.0000 mg | Freq: Once | INTRAMUSCULAR | Status: AC
  Administered 2022-09-30: 40 mg via INTRAVENOUS
  Filled 2022-09-30: qty 1

## 2022-09-30 MED ORDER — HYDROCOD POLI-CHLORPHE POLI ER 10-8 MG/5ML PO SUER
5.0000 mL | Freq: Three times a day (TID) | ORAL | Status: DC | PRN
  Administered 2022-09-30: 5 mL via ORAL
  Filled 2022-09-30: qty 5

## 2022-09-30 NOTE — Progress Notes (Signed)
PROGRESS NOTE    [name]  [id number] DOB: [birth year] DOA: 09/26/2022 PCP: Myrlene Broker, MD   Brief Narrative:  This 88 years old female with PMH significant for COPD not on home oxygen, atrial fibrillation on Eliquis, arthritis, hypertension, hyperlipidemia, GERD presented to the ED with C/O: Nonproductive cough associated with chest congestion and body ache for last 2 to 3 days.  Patient describes cough is dry associated with congestion. She denies sick contacts,  recent travel. She has COPD and  does not use oxygen at baseline. She has been using inhalers without any improvement.She is up-to-date with COVID vaccination including booster.  Chest x-ray shows evidence of chronic lung disease.  Patient is admitted for COVID+ pneumonia.  Assessment & Plan:   Principal Problem:   Pneumonia due to COVID-19 virus Active Problems:   Paroxysmal atrial fibrillation (HCC)   S/P left THA, posterior   Overweight (BMI 25.0-29.9)  Acute hypoxic respiratory failure: COVID+ infection. Patient presented with cough, chest congestion, chest pain and generalized body ache. She was found to be hypoxic with SPO2 87% on room air, requiring 2 L of supplemental oxygen. Continue supplemental oxygen and wean as tolerated. Continue airborne and droplet precautions. Continue Decadron 6 mg daily for 10 days Completing molnapuravir today for 5 days. Continue to monitor inflammatory markers.   COPD: Continue home inhalers. At baseline she does not use oxygen. Continue supplemental oxygen and wean as tolerated   Paroxysmal A-fib: Heart rate well controlled. Continue Cardizem and Eliquis.   Hypertension Continue Cardizem, Aldactone, lisinopril. Resume labetalol, BP well controlled.   Major depression: Continue Remeron.   Chronic diastolic CHF.: Last echo 0000000  shows EF 55 to 60%. Received Lasix 40 mg IV x 2 2D echo shows LVEF 55 to 60% no RWMA Reassess need for Lasix.    GERD: Continue pantoprazole 40 mg daily   Arthritis: Continue gabapentin and Robaxin.   DVT prophylaxis: Eliquis Code Status: Full code Family Communication: Daughter at bed side Disposition Plan:   Status is: Inpatient Remains inpatient appropriate because:Admitted  for COVID-pneumonia, started on molpuranivir  Anticipated discharge home with home health services 10/01/2022.   Consultants:  None  Procedures: None  Antimicrobials:  Anti-infectives (From admission, onward)    Start     Dose/Rate Route Frequency Ordered Stop   09/26/22 2200  molnupiravir EUA (LAGEVRIO) capsule 800 mg        4 capsule Oral 2 times daily 09/26/22 2056 10/01/22 2159   09/26/22 1600  molnupiravir EUA (LAGEVRIO) capsule 800 mg  Status:  Discontinued        4 capsule Oral 2 times daily 09/26/22 1403 09/26/22 2056   09/26/22 1245  nirmatrelvir/ritonavir EUA (renal dosing) (PAXLOVID) 2 tablet  Status:  Discontinued        2 tablet Oral 2 times daily 09/26/22 1236 09/26/22 1403       Subjective: Patient was seen and examined at bedside.  Overnight events noted. Patient reports feeling better but she still has worsening cough.   She remains on 2 L of oxygen, states feeling congested.  Objective: Vitals:   09/29/22 0506 09/29/22 0507 09/29/22 1346 09/29/22 2004  BP: 133/73  117/78 (!) 131/91  Pulse: (!) 121 98 93 99  Resp: 17  20 18   Temp: 98.1 F (36.7 C)  98.2 F (36.8 C) 98.3 F (36.8 C)  TempSrc: Oral   Oral  SpO2: 96% 97% 97% 97%  Weight:      Height:  No intake or output data in the 24 hours ending 09/30/22 1057  Filed Weights   09/26/22 0919  Weight: 87 kg    Examination:  General exam: Appears comfortable, NAD, deconditioned. Respiratory system: Basilar crackles, respiratory effort normal, RR 16 Cardiovascular system: S1-S2 heard, regular rate and rhythm, no murmur. Gastrointestinal system: Abdomen is soft, non tender, non distended, BS+ Central nervous system:  Alert and oriented x3, no focal neurological deficits. Extremities: No edema , no cyanosis, no clubbing Skin: No rashes, lesions or ulcers Psychiatry: Judgement and insight appear normal. Mood & affect appropriate.     Data Reviewed: I have personally reviewed following labs and imaging studies  CBC: Recent Labs  Lab 09/26/22 0947 09/27/22 0520 09/28/22 0333 09/29/22 0449 09/30/22 0400  WBC 8.8 10.7* 14.6* 12.4* 11.7*  NEUTROABS 7.4 9.1* 12.2* 9.7* 9.2*  HGB 12.1 11.6* 12.8 12.3 12.7  HCT 38.5 36.4 39.1 37.7 39.8  MCV 87.9 87.1 86.1 85.1 87.5  PLT 174 176 204 203 193   Basic Metabolic Panel: Recent Labs  Lab 09/26/22 0947 09/27/22 0520 09/28/22 0333 09/29/22 0449 09/30/22 0400  NA 136 136 133* 135 134*  K 4.0 4.3 4.4 4.5 4.5  CL 108 104 99 101 98  CO2 24 25 25 26 26   GLUCOSE 117* 151* 129* 112* 126*  BUN 12 17 19 14 18   CREATININE 0.72 0.70 0.68 0.40* 0.52  CALCIUM 8.6* 8.7* 9.0 8.9 9.0  MG  --  1.7 1.7 2.0 2.0  PHOS  --  4.0 3.2 2.7 2.8   GFR: Estimated Creatinine Clearance: 52.9 mL/min (by C-G formula based on SCr of 0.52 mg/dL). Liver Function Tests: Recent Labs  Lab 09/26/22 0947 09/27/22 0520 09/28/22 0333 09/29/22 0449 09/30/22 0400  AST 22 19 20 18 16   ALT 21 19 18 19 20   ALKPHOS 63 52 58 49 51  BILITOT 0.5 0.4 0.3 0.4 0.4  PROT 6.4* 5.8* 6.7 5.9* 6.1*  ALBUMIN 3.5 3.0* 3.2* 3.0* 2.9*   Recent Labs  Lab 09/26/22 0947  LIPASE 35   No results for input(s): "AMMONIA" in the last 168 hours. Coagulation Profile: No results for input(s): "INR", "PROTIME" in the last 168 hours. Cardiac Enzymes: No results for input(s): "CKTOTAL", "CKMB", "CKMBINDEX", "TROPONINI" in the last 168 hours. BNP (last 3 results) No results for input(s): "PROBNP" in the last 8760 hours. HbA1C: No results for input(s): "HGBA1C" in the last 72 hours. CBG: No results for input(s): "GLUCAP" in the last 168 hours. Lipid Profile: No results for input(s): "CHOL", "HDL",  "LDLCALC", "TRIG", "CHOLHDL", "LDLDIRECT" in the last 72 hours. Thyroid Function Tests: No results for input(s): "TSH", "T4TOTAL", "FREET4", "T3FREE", "THYROIDAB" in the last 72 hours. Anemia Panel: Recent Labs    09/29/22 0449 09/30/22 0400  FERRITIN 366* 433*   Sepsis Labs: Recent Labs  Lab 09/27/22 1500  PROCALCITON <0.10    Recent Results (from the past 240 hour(s))  Resp Panel by RT-PCR (Flu A&B, Covid) Anterior Nasal Swab     Status: Abnormal   Collection Time: 09/26/22  9:47 AM   Specimen: Anterior Nasal Swab  Result Value Ref Range Status   SARS Coronavirus 2 by RT PCR POSITIVE (A) NEGATIVE Final    Comment: (NOTE) SARS-CoV-2 target nucleic acids are DETECTED.  The SARS-CoV-2 RNA is generally detectable in upper respiratory specimens during the acute phase of infection. Positive results are indicative of the presence of the identified virus, but do not rule out bacterial infection or co-infection with other  pathogens not detected by the test. Clinical correlation with patient history and other diagnostic information is necessary to determine patient infection status. The expected result is Negative.  Fact Sheet for Patients: EntrepreneurPulse.com.au  Fact Sheet for Healthcare Providers: IncredibleEmployment.be  This test is not yet approved or cleared by the [place] FDA and  has been authorized for detection and/or diagnosis of SARS-CoV-2 by FDA under an Emergency Use Authorization (EUA).  This EUA will remain in effect (meaning this test can be used) for the duration of  the COVID-19 declaration under Section 564(b)(1) of the A ct, 21 U.S.C. section 360bbb-3(b)(1), unless the authorization is terminated or revoked sooner.     Influenza A by PCR NEGATIVE NEGATIVE Final   Influenza B by PCR NEGATIVE NEGATIVE Final    Comment: (NOTE) The Xpert Xpress SARS-CoV-2/FLU/RSV plus assay is intended as an aid in the diagnosis  of influenza from Nasopharyngeal swab specimens and should not be used as a sole basis for treatment. Nasal washings and aspirates are unacceptable for Xpert Xpress SARS-CoV-2/FLU/RSV testing.  Fact Sheet for Patients: EntrepreneurPulse.com.au  Fact Sheet for Healthcare Providers: IncredibleEmployment.be  This test is not yet approved or cleared by the [place] FDA and has been authorized for detection and/or diagnosis of SARS-CoV-2 by FDA under an Emergency Use Authorization (EUA). This EUA will remain in effect (meaning this test can be used) for the duration of the COVID-19 declaration under Section 564(b)(1) of the Act, 21 U.S.C. section 360bbb-3(b)(1), unless the authorization is terminated or revoked.  Performed at [facility], [place] [address], [place], [place] [address]     Radiology Studies: No results found.   Scheduled Meds:  acetaminophen  1,000 mg Oral Q8H   amLODipine  10 mg Oral QPM   apixaban  5 mg Oral BID   vitamin C  1,000 mg Oral Daily   benazepril  20 mg Oral Daily   cholecalciferol  5,000 Units Oral Daily   cyanocobalamin  1,000 mcg Oral QODAY   dexamethasone  6 mg Oral Daily   docusate sodium  100 mg Oral BID   gabapentin  300 mg Oral Once   gabapentin  300 mg Oral Daily   And   gabapentin  600 mg Oral q1600   labetalol  200 mg Oral BID   methylPREDNISolone (SOLU-MEDROL) injection  40 mg Intravenous Once   mirtazapine  15 mg Oral QHS   molnupiravir EUA  4 capsule Oral BID   pantoprazole  40 mg Oral Daily   polyethylene glycol  17 g Oral Daily   senna  1 tablet Oral QHS   spironolactone  25 mg Oral Daily   Continuous Infusions:   LOS: 4 days    Time spent: 35 mins    Kiaya Haliburton, MD [facility]   If 7PM-7AM, please contact night-coverage

## 2022-09-30 NOTE — Progress Notes (Signed)
Vital Signs  Oscar La 09/30/2022,8:54 PM   09/30/22 2000  Assess: MEWS Score  Temp 98 F (36.7 C)  BP (!) 141/72  MAP (mmHg) 91  Pulse Rate (!) 118  Resp 20  Level of Consciousness Alert  SpO2 93 %  O2 Device Nasal Cannula  O2 Flow Rate (L/min) 1 L/min  Assess: MEWS Score  MEWS Temp 0  MEWS Systolic 0  MEWS Pulse 2  MEWS RR 0  MEWS LOC 0  MEWS Score 2  MEWS Score Color Yellow  Assess: if the MEWS score is Yellow or Red  Were vital signs taken at a resting state? Yes  Focused Assessment No change from prior assessment  Does the patient meet 2 or more of the SIRS criteria? Yes  Does the patient have a confirmed or suspected source of infection? Yes  Provider and Rapid Response Notified? No  MEWS guidelines implemented *See Row Information* No, vital signs rechecked  Treat  MEWS Interventions Administered prn meds/treatments  Pain Scale 0-10  Pain Score 0  Take Vital Signs  Increase Vital Sign Frequency  Yellow: Q 2hr X 2 then Q 4hr X 2, if remains yellow, continue Q 4hrs  Escalate  MEWS: Escalate Yellow: discuss with charge nurse/RN and consider discussing with provider and RRT  Notify: Charge Nurse/RN  Name of Charge Nurse/RN Notified Malen Gauze, RN  Date Charge Nurse/RN Notified 09/30/22  Time Charge Nurse/RN Notified 2053  Document  Patient Outcome Stabilized after interventions  Progress note created (see row info) Yes  Assess: SIRS CRITERIA  SIRS Temperature  0  SIRS Pulse 1  SIRS Respirations  0  SIRS WBC 1  SIRS Score Sum  2

## 2022-10-01 LAB — CBC WITH DIFFERENTIAL/PLATELET
Abs Immature Granulocytes: 0.23 10*3/uL — ABNORMAL HIGH (ref 0.00–0.07)
Basophils Absolute: 0.1 10*3/uL (ref 0.0–0.1)
Basophils Relative: 1 %
Eosinophils Absolute: 0 10*3/uL (ref 0.0–0.5)
Eosinophils Relative: 0 %
HCT: 39.6 % (ref 36.0–46.0)
Hemoglobin: 12.6 g/dL (ref 12.0–15.0)
Immature Granulocytes: 2 %
Lymphocytes Relative: 11 %
Lymphs Abs: 1.4 10*3/uL (ref 0.7–4.0)
MCH: 27.5 pg (ref 26.0–34.0)
MCHC: 31.8 g/dL (ref 30.0–36.0)
MCV: 86.5 fL (ref 80.0–100.0)
Monocytes Absolute: 1 10*3/uL (ref 0.1–1.0)
Monocytes Relative: 8 %
Neutro Abs: 9.9 10*3/uL — ABNORMAL HIGH (ref 1.7–7.7)
Neutrophils Relative %: 78 %
Platelets: 253 10*3/uL (ref 150–400)
RBC: 4.58 MIL/uL (ref 3.87–5.11)
RDW: 13.6 % (ref 11.5–15.5)
WBC: 12.7 10*3/uL — ABNORMAL HIGH (ref 4.0–10.5)
nRBC: 0 % (ref 0.0–0.2)

## 2022-10-01 LAB — FERRITIN: Ferritin: 562 ng/mL — ABNORMAL HIGH (ref 11–307)

## 2022-10-01 LAB — COMPREHENSIVE METABOLIC PANEL
ALT: 39 U/L (ref 0–44)
AST: 25 U/L (ref 15–41)
Albumin: 3.2 g/dL — ABNORMAL LOW (ref 3.5–5.0)
Alkaline Phosphatase: 54 U/L (ref 38–126)
Anion gap: 7 (ref 5–15)
BUN: 19 mg/dL (ref 8–23)
CO2: 30 mmol/L (ref 22–32)
Calcium: 9.4 mg/dL (ref 8.9–10.3)
Chloride: 97 mmol/L — ABNORMAL LOW (ref 98–111)
Creatinine, Ser: 0.58 mg/dL (ref 0.44–1.00)
GFR, Estimated: >60 mL/min (ref >60)
Glucose, Bld: 120 mg/dL — ABNORMAL HIGH (ref 70–99)
Potassium: 4.8 mmol/L (ref 3.5–5.1)
Sodium: 134 mmol/L — ABNORMAL LOW (ref 135–145)
Total Bilirubin: 0.6 mg/dL (ref 0.3–1.2)
Total Protein: 6.6 g/dL (ref 6.5–8.1)

## 2022-10-01 LAB — C-REACTIVE PROTEIN: CRP: 3.2 mg/dL — ABNORMAL HIGH (ref <1.0)

## 2022-10-01 LAB — BRAIN NATRIURETIC PEPTIDE: B Natriuretic Peptide: 703.6 pg/mL — ABNORMAL HIGH (ref 0.0–100.0)

## 2022-10-01 LAB — MAGNESIUM: Magnesium: 2.1 mg/dL (ref 1.7–2.4)

## 2022-10-01 LAB — PHOSPHORUS: Phosphorus: 3.2 mg/dL (ref 2.5–4.6)

## 2022-10-01 MED ORDER — IPRATROPIUM-ALBUTEROL 0.5-2.5 (3) MG/3ML IN SOLN
3.0000 mL | Freq: Four times a day (QID) | RESPIRATORY_TRACT | Status: DC
  Administered 2022-10-01 – 2022-10-02 (×4): 3 mL via RESPIRATORY_TRACT
  Filled 2022-10-01 (×4): qty 3

## 2022-10-01 MED ORDER — FUROSEMIDE 10 MG/ML IJ SOLN
40.0000 mg | Freq: Once | INTRAMUSCULAR | Status: AC
  Administered 2022-10-01: 40 mg via INTRAVENOUS
  Filled 2022-10-01: qty 4

## 2022-10-01 MED ORDER — HYDROCOD POLI-CHLORPHE POLI ER 10-8 MG/5ML PO SUER
5.0000 mL | Freq: Two times a day (BID) | ORAL | Status: DC | PRN
  Administered 2022-10-01 – 2022-10-02 (×2): 5 mL via ORAL
  Filled 2022-10-01 (×2): qty 5

## 2022-10-01 NOTE — Progress Notes (Signed)
PROGRESS NOTE    Mckyla Deckman  SEL:[id number] DOB: 1932-11-20 DOA: 09/26/2022  PCP: Hadley Pen, MD   Brief Narrative:  This 88 years old female with PMH significant for COPD not on home oxygen, atrial fibrillation on Eliquis, arthritis, hypertension, hyperlipidemia, GERD presented to the ED with C/O: Nonproductive cough associated with chest congestion and body ache for last 2 to 3 days.  Patient describes cough is dry associated with congestion. She denies sick contacts,  recent travel. She has COPD and  does not use oxygen at baseline. She has been using inhalers without any improvement.She is up-to-date with COVID vaccination including booster.  Chest x-ray shows evidence of chronic lung disease.  Patient is admitted for COVID+ pneumonia.  Assessment & Plan:   Principal Problem:   Pneumonia due to COVID-19 virus Active Problems:   Paroxysmal atrial fibrillation (HCC)   S/P left THA, posterior   Overweight (BMI 25.0-29.9)  Acute hypoxic respiratory failure: COVID+ infection. Patient presented with cough, chest congestion, chest pain and generalized body ache. She was found to be hypoxic with SPO2 87% on room air, requiring 2 L of supplemental oxygen. Continue supplemental oxygen and wean as tolerated. Continue airborne and droplet precautions. Continue Decadron 6 mg daily for 10 days Completed molnapuravir for 5 days. Continue to monitor inflammatory markers. She still remains on 2 L of supplemental oxygen.   COPD: Continue home inhalers. At baseline she does not use oxygen. Continue supplemental oxygen and wean as tolerated Continue DuoNeb nebulization every 6 hours as needed   Paroxysmal A-fib: Heart rate well controlled. Continue Cardizem and Eliquis.   Hypertension Continue Cardizem, Aldactone, lisinopril. Resume labetalol, BP well controlled.   Major depression: Continue Remeron.   Chronic diastolic CHF.: Last echo 9/21  shows EF 55 to 60%. Received  Lasix 40 mg IV x 2 2D echo shows LVEF 55 to 60% no RWMA BNP 720.  Start Lasix 40 mg daily. Monitor intake output charting.   GERD: Continue pantoprazole 40 mg daily.   Arthritis: Continue gabapentin and Robaxin.   DVT prophylaxis: Eliquis Code Status: Full code Family Communication: Daughter at bed side Disposition Plan:   Status is: Inpatient Remains inpatient appropriate because:Admitted  for COVID-pneumonia, started on molpuranivir  Anticipated discharge home with home health services 10/02/2022.   Consultants:  None  Procedures: None  Antimicrobials:  Anti-infectives (From admission, onward)    Start     Dose/Rate Route Frequency Ordered Stop   09/26/22 2200  molnupiravir EUA (LAGEVRIO) capsule 800 mg        4 capsule Oral 2 times daily 09/26/22 2056 10/01/22 0938   09/26/22 1600  molnupiravir EUA (LAGEVRIO) capsule 800 mg  Status:  Discontinued        4 capsule Oral 2 times daily 09/26/22 1403 09/26/22 2056   09/26/22 1245  nirmatrelvir/ritonavir EUA (renal dosing) (PAXLOVID) 2 tablet  Status:  Discontinued        2 tablet Oral 2 times daily 09/26/22 1236 09/26/22 1403       Subjective: Patient was seen and examined at bedside.  Overnight events noted. Patient reports feeling congested and tired. She remains on 2 L of oxygen,. She is found to have wheezing noted on exam.  Objective: Vitals:   10/01/22 0004 10/01/22 0358 10/01/22 0719 10/01/22 1236  BP: (!) 149/105 (!) 148/99 (!) 124/96 (!) 137/105  Pulse: 69 98 98 (!) 104  Resp: 18 18 20    Temp: 97.9 F (36.6 C) 99.1 F (37.3 C) 97.7  F (36.5 C)   TempSrc: Oral Oral Oral   SpO2: 97% 97% 93% 96%  Weight:      Height:        Intake/Output Summary (Last 24 hours) at 10/01/2022 1401 Last data filed at 10/01/2022 0721 Gross per 24 hour  Intake --  Output 1100 ml  Net -1100 ml    Filed Weights   09/26/22 0919  Weight: 87 kg    Examination:  General exam: Appears comfortable, NAD,  deconditioned. Respiratory system: Basilar crackles, wheezing, normal respiratory effort, RR 15 Cardiovascular system: S1-S2 heard, regular rate and rhythm, no murmur. Gastrointestinal system: Abdomen is soft, non tender, non distended, BS+ Central nervous system: Alert and oriented x3, no focal neurological deficits. Extremities: No edema , no cyanosis, no clubbing Skin: No rashes, lesions or ulcers Psychiatry: Judgement and insight appear normal. Mood & affect appropriate.     Data Reviewed: I have personally reviewed following labs and imaging studies  CBC: Recent Labs  Lab 09/27/22 0520 09/28/22 0333 09/29/22 0449 09/30/22 0400 10/01/22 0758  WBC 10.7* 14.6* 12.4* 11.7* 12.7*  NEUTROABS 9.1* 12.2* 9.7* 9.2* 9.9*  HGB 11.6* 12.8 12.3 12.7 12.6  HCT 36.4 39.1 37.7 39.8 39.6  MCV 87.1 86.1 85.1 87.5 86.5  PLT 176 204 203 193 [id number]   Basic Metabolic Panel: Recent Labs  Lab 09/27/22 0520 09/28/22 0333 09/29/22 0449 09/30/22 0400 10/01/22 0758  NA 136 133* 135 134* 134*  K 4.3 4.4 4.5 4.5 4.8  CL 104 99 101 98 97*  CO2 25 25 26 26 30   GLUCOSE 151* 129* 112* 126* 120*  BUN 17 19 14 18 19   CREATININE 0.70 0.68 0.40* 0.52 0.58  CALCIUM 8.7* 9.0 8.9 9.0 9.4  MG 1.7 1.7 2.0 2.0 2.1  PHOS 4.0 3.2 2.7 2.8 3.2   GFR: Estimated Creatinine Clearance: 52.9 mL/min (by C-G formula based on SCr of 0.58 mg/dL). Liver Function Tests: Recent Labs  Lab 09/27/22 0520 09/28/22 0333 09/29/22 0449 09/30/22 0400 10/01/22 0758  AST 19 20 18 16 25   ALT 19 18 19 20  39  ALKPHOS 52 58 49 51 54  BILITOT 0.4 0.3 0.4 0.4 0.6  PROT 5.8* 6.7 5.9* 6.1* 6.6  ALBUMIN 3.0* 3.2* 3.0* 2.9* 3.2*   Recent Labs  Lab 09/26/22 0947  LIPASE 35   No results for input(s): "AMMONIA" in the last 168 hours. Coagulation Profile: No results for input(s): "INR", "PROTIME" in the last 168 hours. Cardiac Enzymes: No results for input(s): "CKTOTAL", "CKMB", "CKMBINDEX", "TROPONINI" in the last 168  hours. BNP (last 3 results) No results for input(s): "PROBNP" in the last 8760 hours. HbA1C: No results for input(s): "HGBA1C" in the last 72 hours. CBG: No results for input(s): "GLUCAP" in the last 168 hours. Lipid Profile: No results for input(s): "CHOL", "HDL", "LDLCALC", "TRIG", "CHOLHDL", "LDLDIRECT" in the last 72 hours. Thyroid Function Tests: No results for input(s): "TSH", "T4TOTAL", "FREET4", "T3FREE", "THYROIDAB" in the last 72 hours. Anemia Panel: Recent Labs    09/30/22 0400 10/01/22 0758  FERRITIN 433* 562*   Sepsis Labs: Recent Labs  Lab 09/27/22 1500  PROCALCITON <0.10    Recent Results (from the past 240 hour(s))  Resp Panel by RT-PCR (Flu A&B, Covid) Anterior Nasal Swab     Status: Abnormal   Collection Time: 09/26/22  9:47 AM   Specimen: Anterior Nasal Swab  Result Value Ref Range Status   SARS Coronavirus 2 by RT PCR POSITIVE (A) NEGATIVE Final  Comment: (NOTE) SARS-CoV-2 target nucleic acids are DETECTED.  The SARS-CoV-2 RNA is generally detectable in upper respiratory specimens during the acute phase of infection. Positive results are indicative of the presence of the identified virus, but do not rule out bacterial infection or co-infection with other pathogens not detected by the test. Clinical correlation with patient history and other diagnostic information is necessary to determine patient infection status. The expected result is Negative.  Fact Sheet for Patients: EntrepreneurPulse.com.au  Fact Sheet for Healthcare Providers: IncredibleEmployment.be  This test is not yet approved or cleared by the [place] FDA and  has been authorized for detection and/or diagnosis of SARS-CoV-2 by FDA under an Emergency Use Authorization (EUA).  This EUA will remain in effect (meaning this test can be used) for the duration of  the COVID-19 declaration under Section 564(b)(1) of the A ct, 21 U.S.C. section  360bbb-3(b)(1), unless the authorization is terminated or revoked sooner.     Influenza A by PCR NEGATIVE NEGATIVE Final   Influenza B by PCR NEGATIVE NEGATIVE Final    Comment: (NOTE) The Xpert Xpress SARS-CoV-2/FLU/RSV plus assay is intended as an aid in the diagnosis of influenza from Nasopharyngeal swab specimens and should not be used as a sole basis for treatment. Nasal washings and aspirates are unacceptable for Xpert Xpress SARS-CoV-2/FLU/RSV testing.  Fact Sheet for Patients: EntrepreneurPulse.com.au  Fact Sheet for Healthcare Providers: IncredibleEmployment.be  This test is not yet approved or cleared by the [place] FDA and has been authorized for detection and/or diagnosis of SARS-CoV-2 by FDA under an Emergency Use Authorization (EUA). This EUA will remain in effect (meaning this test can be used) for the duration of the COVID-19 declaration under Section 564(b)(1) of the Act, 21 U.S.C. section 360bbb-3(b)(1), unless the authorization is terminated or revoked.  Performed at [facility], [place] [address] [place], [place] [address]     Radiology Studies: DG CHEST PORT 1 VIEW  Result Date: 09/30/2022 CLINICAL DATA:  COVID positive sent to evaluate for pneumonia. EXAM: PORTABLE CHEST 1 VIEW COMPARISON:  September 26, 2022 FINDINGS: The heart size and mediastinal contours are within normal limits. The lungs are hyperinflated. Mild, diffuse, chronic appearing increased lung markings are seen. Stable, mild to moderate severity asymmetric reticulonodular opacity is seen within the right upper lobe. Mild atelectasis is seen within the bilateral lung bases. There is no evidence of a pleural effusion or pneumothorax. The visualized skeletal structures are unremarkable. IMPRESSION: Asymmetric chronic lung disease, most prominent within the right upper lobe. A mild component of superimposed right upper lobe infiltrate cannot  be excluded. Electronically Signed   By: Virgina Norfolk M.D.   On: 09/30/2022 20:33     Scheduled Meds:  acetaminophen  1,000 mg Oral Q8H   amLODipine  10 mg Oral QPM   apixaban  5 mg Oral BID   vitamin C  1,000 mg Oral Daily   benazepril  20 mg Oral Daily   cholecalciferol  5,000 Units Oral Daily   cyanocobalamin  1,000 mcg Oral QODAY   dexamethasone  6 mg Oral Daily   docusate sodium  100 mg Oral BID   furosemide  40 mg Intravenous Once   gabapentin  300 mg Oral Once   gabapentin  300 mg Oral Daily   And   gabapentin  600 mg Oral q1600   ipratropium-albuterol  3 mL Nebulization Q6H   labetalol  200 mg Oral BID   mirtazapine  15 mg Oral QHS  pantoprazole  40 mg Oral Daily   polyethylene glycol  17 g Oral Daily   senna  1 tablet Oral QHS   spironolactone  25 mg Oral Daily   sucralfate  1 g Oral TID WC & HS   Continuous Infusions:   LOS: 5 days    Time spent: 35 mins    Tyresse Jayson, MD [facility]   If 7PM-7AM, please contact night-coverage

## 2022-10-01 NOTE — Progress Notes (Signed)
PT Cancellation Note  Patient Details Name: [name] MRN: [id number] DOB: [birth year]   Cancelled Treatment:    Reason Eval/Treat Not Completed: Patient declined, no reason specified Pt just assisted to [facility] and reported seeing black spots and feeling dizzy.  Vitals below.  RN notified.  Pt wanted to eat lunch.  Vitals:   10/01/22 0719 10/01/22 1236  BP: (!) 124/96 (!) 137/105  Pulse: 98 (!) 104  Resp: 20   Temp: 97.7 F (36.5 C)   SpO2: 93% 96%      [name] 10/01/2022, 12:58 PM Thomasene Mohair PT, DPT Physical Therapist [facility] Preferred contact method: Secure Chat Weekend Pager Only: [phone] Office: [phone]

## 2022-10-02 MED ORDER — DEXAMETHASONE 6 MG PO TABS: 6.0000 mg | ORAL_TABLET | Freq: Every day | ORAL | 0 refills | Status: AC

## 2022-10-02 MED ORDER — HYDROCOD POLI-CHLORPHE POLI ER 10-8 MG/5ML PO SUER: 5.0000 mL | Freq: Two times a day (BID) | ORAL | 0 refills | Status: AC | PRN

## 2022-10-02 MED ORDER — FUROSEMIDE 20 MG PO TABS: 20.0000 mg | ORAL_TABLET | Freq: Every day | ORAL | 0 refills | Status: DC

## 2022-10-02 NOTE — Discharge Summary (Signed)
Physician Discharge Summary  Golden Emile BMW:[id number] DOB: 02-Dec-1932 DOA: 09/26/2022  PCP: Hadley Pen, MD  Admit date: 09/26/2022  Discharge date: 10/02/2022  Admitted From: Home. Disposition:  Home Health Services   Recommendations for Outpatient Follow-up:  Follow up with PCP in 1-2 weeks Please obtain BMP/CBC in one week. Advised to take Decadron 6 mg daily for 4 more days. Patient has completed molnupiravir therapy for 5 days. Advised to continue DuoNeb nebulization as needed. Advised to take Lasix 20 mg daily.  Home Health:Home PT/OT Equipment/Devices: None  Discharge Condition: Stable CODE STATUS:Limited code Diet recommendation: Heart Healthy   Brief [facility] Course: This 88 years old female with PMH significant for COPD not on home oxygen, atrial fibrillation on Eliquis, arthritis, hypertension, hyperlipidemia, GERD presented to the ED with C/O: Nonproductive cough associated with chest congestion and body ache for last 2 to 3 days.  Patient describes cough is dry associated with congestion. She denies sick contacts,  recent travel. She has COPD and  does not use oxygen at baseline. She has been using inhalers without any improvement. She is up-to-date with COVID vaccination including booster.  Chest x-ray shows evidence of chronic lung disease.  Patient was admitted for COVID+ pneumonia.  Continued on home medications.  She was started on molnupiravir and Decadron as per COVID protocol.  Patient has also developed chest congestion with bilateral basilar crackles.  She has required Lasix for few days.  Patient was also given Tussionex for cough suppression.  Patient has made significant improvement throughout hospital course.  She is weaned down to room air.  Patient has completed 5 days of molnupiravir therapy.  Patient feels better and wants to be discharged.  Home health services arranged.  Patient is being discharged home.   Discharge Diagnoses:   Principal Problem:   Pneumonia due to COVID-19 virus Active Problems:   Paroxysmal atrial fibrillation (HCC)   S/P left THA, posterior   Overweight (BMI 25.0-29.9)  Acute hypoxic respiratory failure: COVID+ infection. Patient presented with cough, chest congestion, chest pain and generalized body ache. She was found to be hypoxic with SPO2 87% on room air, requiring 2 L of supplemental oxygen. Continue supplemental oxygen and wean as tolerated. Continue airborne and droplet precautions. Continue Decadron 6 mg daily for 10 days Completed molnapuravir for 5 days. Continue to monitor inflammatory markers. She is successfully weaned down to room air.   COPD: Continue home inhalers. At baseline she does not use oxygen. Continue supplemental oxygen and wean as tolerated Continue DuoNeb nebulization every 6 hours as needed   Paroxysmal A-fib: Heart rate well controlled. Continue Cardizem and Eliquis.   Hypertension Continue Cardizem, Aldactone, lisinopril. Resume labetalol, BP well controlled.   Major depression: Continue Remeron.   Chronic diastolic CHF.: Last echo 9/21  shows EF 55 to 60%. Received Lasix 40 mg IV x 2 2D echo shows LVEF 55 to 60% no RWMA BNP 720.  Start Lasix 20 mg daily. Monitor intake output charting.   GERD: Continue pantoprazole 40 mg daily.   Arthritis: Continue gabapentin and Robaxin.  Discharge Instructions  Discharge Instructions     Call MD for:  difficulty breathing, headache or visual disturbances   Complete by: As directed    Call MD for:  persistant dizziness or light-headedness   Complete by: As directed    Call MD for:  persistant nausea and vomiting   Complete by: As directed    Diet - low sodium heart healthy   Complete by:  As directed    Diet Carb Modified   Complete by: As directed    Discharge instructions   Complete by: As directed    Advised to follow-up with primary care physician in 1 week. Advised to take Decadron  6 mg daily for 4 more days. Patient has completed molnupiravir therapy for 5 days. Advised to continue DuoNeb nebulization as needed. Advised to take Lasix 20 mg daily.   Increase activity slowly   Complete by: As directed       Allergies as of 10/02/2022       Reactions   Keflex [cephalexin] Other (See Comments)   "makes pt feel really bad"        Medication List     STOP taking these medications    Vitamin E 400 units Tabs       TAKE these medications    acetaminophen 500 MG tablet Commonly known as: TYLENOL Take 2 tablets (1,000 mg total) by mouth every 8 (eight) hours.   albuterol 108 (90 Base) MCG/ACT inhaler Commonly known as: VENTOLIN HFA Inhale 2 puffs into the lungs every 4 (four) hours as needed.   amLODipine 10 MG tablet Commonly known as: NORVASC Take 10 mg by mouth every evening.   benazepril 20 MG tablet Commonly known as: LOTENSIN Take 20 mg by mouth 2 (two) times daily.   cetirizine 10 MG tablet Commonly known as: ZYRTEC Take 10 mg by mouth daily.   chlorpheniramine-HYDROcodone 10-8 MG/5ML Commonly known as: TUSSIONEX Take 5 mLs by mouth every 12 (twelve) hours as needed for up to 5 days for cough.   cyanocobalamin 1000 MCG tablet Take 1 tablet by mouth every other day. Vitamin B12   dexamethasone 6 MG tablet Commonly known as: DECADRON Take 1 tablet (6 mg total) by mouth daily for 4 days.   diltiazem 30 MG tablet Commonly known as: Cardizem Take 1-2 tablets (30-60 mg total) by mouth 4 (four) times daily as needed (for heart rates greater than 100 bpm during atrial fibrillation).   Eliquis 5 MG Tabs tablet Generic drug: apixaban TAKE 1 TABLET BY MOUTH TWICE A DAY   ferrous sulfate 325 (65 FE) MG tablet Commonly known as: FerrouSul Take 1 tablet (325 mg total) by mouth 3 (three) times daily with meals for 14 days. What changed: when to take this   furosemide 20 MG tablet Commonly known as: Lasix Take 1 tablet (20 mg total) by  mouth daily.   gabapentin 300 MG capsule Commonly known as: NEURONTIN Take 300 mg by mouth See admin instructions. Take one capsule (300 mg) in the morning and two capsules (600 mg) in the evening.   ipratropium-albuterol 0.5-2.5 (3) MG/3ML Soln Commonly known as: DUONEB Take 3 mLs by nebulization every 6 (six) hours as needed.   Klor-Con M20 20 MEQ tablet Generic drug: potassium chloride SA Take 20 mEq by mouth daily with supper.   labetalol 200 MG tablet Commonly known as: NORMODYNE Take 200 mg by mouth 2 (two) times daily.   methocarbamol 750 MG tablet Commonly known as: ROBAXIN Take 750 mg by mouth 3 (three) times daily as needed.   mirtazapine 15 MG tablet Commonly known as: REMERON Take 15 mg by mouth at bedtime.   multivitamin with minerals Tabs tablet Take 1 tablet by mouth daily.   OMEGA-3 FISH OIL PO Take 1 capsule by mouth daily.   pantoprazole 40 MG tablet Commonly known as: PROTONIX Take 40 mg by mouth daily.   pravastatin 20 MG  tablet Commonly known as: PRAVACHOL Take 20 mg by mouth at bedtime.   PRESERVISION AREDS 2 PO Take 1 tablet by mouth daily. Vision Shield   spironolactone 25 MG tablet Commonly known as: ALDACTONE Take 25 mg by mouth daily.   sucralfate 1 g tablet Commonly known as: CARAFATE Take 1 g by mouth 4 (four) times daily -  with meals and at bedtime.   vitamin C 1000 MG tablet Take 1,000 mg by mouth daily.   Vitamin D3 125 MCG (5000 UT) Tabs Take 5,000 Units by mouth daily.               Durable Medical Equipment  (From admission, onward)           Start     Ordered   10/02/22 1032  For home use only DME oxygen  Once       Question Answer Comment  Length of Need Lifetime   Mode or (Route) Nasal cannula   Liters per Minute 2   Frequency Continuous (stationary and portable oxygen unit needed)   Oxygen conserving device Yes   Oxygen delivery system Gas      10/02/22 1031            Follow-up  Information     Myrlene Broker, MD Follow up in 1 week(s).   Specialty: Family Medicine Contact information: [place] [address] [phone]                Allergies  Allergen Reactions   Keflex [Cephalexin] Other (See Comments)    "makes pt feel really bad"    Consultations: None   Procedures/Studies: DG CHEST PORT 1 VIEW  Result Date: 09/30/2022 CLINICAL DATA:  COVID positive sent to evaluate for pneumonia. EXAM: PORTABLE CHEST 1 VIEW COMPARISON:  September 26, 2022 FINDINGS: The heart size and mediastinal contours are within normal limits. The lungs are hyperinflated. Mild, diffuse, chronic appearing increased lung markings are seen. Stable, mild to moderate severity asymmetric reticulonodular opacity is seen within the right upper lobe. Mild atelectasis is seen within the bilateral lung bases. There is no evidence of a pleural effusion or pneumothorax. The visualized skeletal structures are unremarkable. IMPRESSION: Asymmetric chronic lung disease, most prominent within the right upper lobe. A mild component of superimposed right upper lobe infiltrate cannot be excluded. Electronically Signed   By: Virgina Norfolk M.D.   On: 09/30/2022 20:33   ECHOCARDIOGRAM COMPLETE  Result Date: 09/27/2022    ECHOCARDIOGRAM REPORT   Patient Name:   CHARNETTA SABIC Date of Exam: 09/27/2022 Medical Rec #:  [id number]      Height:       64.0 in Accession #:    [id number]     Weight:       191.8 lb Date of Birth:  1933/02/01      BSA:          1.922 m Patient Age:    59 years       BP:           174/79 mmHg Patient Gender: F              HR:           61 bpm. Exam Location:  Inpatient Procedure: 2D Echo, Cardiac Doppler and Color Doppler Indications:    Dyspnea  History:        Patient has no prior history of Echocardiogram examinations.  Arrythmias:Atrial Fibrillation. COVID +.  Sonographer:    Mikki Harbor Referring Phys: [phone] Jadynn Epping   Sonographer Comments: Image acquisition challenging due to respiratory motion. IMPRESSIONS  1. Left ventricular ejection fraction, by estimation, is 60 to 65%. The left ventricle has normal function. The left ventricle has no regional wall motion abnormalities. Left ventricular diastolic parameters were normal.  2. Right ventricular systolic function is normal. The right ventricular size is normal. There is moderately elevated pulmonary artery systolic pressure.  3. Left atrial size was moderately dilated.  4. The mitral valve is normal in structure. Trivial mitral valve regurgitation. No evidence of mitral stenosis. Severe mitral annular calcification.  5. The aortic valve is tricuspid. There is moderate calcification of the aortic valve. Aortic valve regurgitation is not visualized. Aortic valve sclerosis/calcification is present, without any evidence of aortic stenosis.  6. The inferior vena cava is normal in size with greater than 50% respiratory variability, suggesting right atrial pressure of 3 mmHg. FINDINGS  Left Ventricle: Left ventricular ejection fraction, by estimation, is 60 to 65%. The left ventricle has normal function. The left ventricle has no regional wall motion abnormalities. The left ventricular internal cavity size was normal in size. There is  no left ventricular hypertrophy. Left ventricular diastolic parameters were normal. Right Ventricle: The right ventricular size is normal. No increase in right ventricular wall thickness. Right ventricular systolic function is normal. There is moderately elevated pulmonary artery systolic pressure. The tricuspid regurgitant velocity is 3.08 m/s, and with an assumed right atrial pressure of 8 mmHg, the estimated right ventricular systolic pressure is 45.9 mmHg. Left Atrium: Left atrial size was moderately dilated. Right Atrium: Right atrial size was normal in size. Pericardium: There is no evidence of pericardial effusion. Mitral Valve: The mitral valve  is normal in structure. There is moderate thickening of the mitral valve leaflet(s). There is moderate calcification of the mitral valve leaflet(s). Severe mitral annular calcification. Trivial mitral valve regurgitation. No evidence of mitral valve stenosis. MV peak gradient, 3.3 mmHg. The mean mitral valve gradient is 1.0 mmHg. Tricuspid Valve: The tricuspid valve is normal in structure. Tricuspid valve regurgitation is mild . No evidence of tricuspid stenosis. Aortic Valve: The aortic valve is tricuspid. There is moderate calcification of the aortic valve. Aortic valve regurgitation is not visualized. Aortic valve sclerosis/calcification is present, without any evidence of aortic stenosis. Aortic valve mean gradient measures 3.0 mmHg. Aortic valve peak gradient measures 5.1 mmHg. Aortic valve area, by VTI measures 2.06 cm. Pulmonic Valve: The pulmonic valve was normal in structure. Pulmonic valve regurgitation is not visualized. No evidence of pulmonic stenosis. Aorta: The aortic root is normal in size and structure. Venous: The inferior vena cava is normal in size with greater than 50% respiratory variability, suggesting right atrial pressure of 3 mmHg. IAS/Shunts: No atrial level shunt detected by color flow Doppler.  LEFT VENTRICLE PLAX 2D LVIDd:         5.10 cm   Diastology LVIDs:         2.80 cm   LV e' medial:    9.25 cm/s LV PW:         1.00 cm   LV E/e' medial:  10.1 LV IVS:        1.00 cm   LV e' lateral:   10.20 cm/s LVOT diam:     1.90 cm   LV E/e' lateral: 9.1 LV SV:         62 LV SV Index:  32 LVOT Area:     2.84 cm  RIGHT VENTRICLE RV Basal diam:  3.75 cm RV Mid diam:    3.30 cm RV S prime:     16.80 cm/s TAPSE (M-mode): 2.3 cm LEFT ATRIUM              Index        RIGHT ATRIUM           Index LA diam:        4.50 cm  2.34 cm/m   RA Area:     21.20 cm LA Vol (A2C):   113.5 ml 59.06 ml/m  RA Volume:   64.60 ml  33.61 ml/m LA Vol (A4C):   108.0 ml 56.20 ml/m LA Biplane Vol: 111.0 ml 57.76  ml/m  AORTIC VALVE                    PULMONIC VALVE AV Area (Vmax):    2.18 cm     PV Vmax:       0.85 m/s AV Area (Vmean):   1.91 cm     PV Peak grad:  2.9 mmHg AV Area (VTI):     2.06 cm AV Vmax:           113.00 cm/s AV Vmean:          79.900 cm/s AV VTI:            0.303 m AV Peak Grad:      5.1 mmHg AV Mean Grad:      3.0 mmHg LVOT Vmax:         86.80 cm/s LVOT Vmean:        53.900 cm/s LVOT VTI:          0.220 m LVOT/AV VTI ratio: 0.73  AORTA Ao Root diam: 3.70 cm MITRAL VALVE               TRICUSPID VALVE MV Area (PHT): 3.12 cm    TR Peak grad:   37.9 mmHg MV Area VTI:   2.24 cm    TR Vmax:        308.00 cm/s MV Peak grad:  3.3 mmHg MV Mean grad:  1.0 mmHg    SHUNTS MV Vmax:       0.91 m/s    Systemic VTI:  0.22 m MV Vmean:      55.4 cm/s   Systemic Diam: 1.90 cm MV Decel Time: 243 msec MV E velocity: 93.00 cm/s MV A velocity: 72.80 cm/s MV E/A ratio:  1.28 Jenkins Rouge MD Electronically signed by Jenkins Rouge MD Signature Date/Time: 09/27/2022/4:14:48 PM    Final    DG Chest Portable 1 View  Result Date: 09/26/2022 CLINICAL DATA:  88 year old female with productive cough, headache and body ache for 2 days. EXAM: PORTABLE CHEST 1 VIEW COMPARISON:  Chest radiographs 02/27/2022. FINDINGS: Portable AP upright view at 0938 hours. Lower lung volumes. Stable cardiac size and mediastinal contours. Mild cardiomegaly. Chronic appearing upper lobe lung disease with asymmetric reticulonodular opacity greater on the right not definitely changed from April. No superimposed pneumothorax, pulmonary edema, pleural effusion or consolidation. Stable trachea. Osteopenia. No acute osseous abnormality identified. IMPRESSION: Evidence of chronic lung disease on radiographs this year. No acute cardiopulmonary abnormality identified. Electronically Signed   By: Genevie Ann M.D.   On: 09/26/2022 09:44     Subjective: Patient was seen and examined at bedside.  Overnight events noted.   Patient reports doing much better.  Cough still persists but getting better.   Daughter at bedside,  all questions answered. Patient is being discharged home.  Discharge Exam: Vitals:   10/02/22 0947 10/02/22 1043  BP:    Pulse:    Resp:    Temp:    SpO2: 97% 92%   Vitals:   10/02/22 0137 10/02/22 0443 10/02/22 0947 10/02/22 1043  BP:  110/84    Pulse:  66    Resp:  18    Temp:  98.1 F (36.7 C)    TempSrc:  Oral    SpO2: 97% 96% 97% 92%  Weight:      Height:        General: Pt is alert, awake, not in acute distress Cardiovascular: RRR, S1/S2 +, no rubs, no gallops Respiratory: CTA bilaterally, no wheezing, no rhonchi Abdominal: Soft, NT, ND, bowel sounds + Extremities: no edema, no cyanosis    The results of significant diagnostics from this hospitalization (including imaging, microbiology, ancillary and laboratory) are listed below for reference.     Microbiology: Recent Results (from the past 240 hour(s))  Resp Panel by RT-PCR (Flu A&B, Covid) Anterior Nasal Swab     Status: Abnormal   Collection Time: 09/26/22  9:47 AM   Specimen: Anterior Nasal Swab  Result Value Ref Range Status   SARS Coronavirus 2 by RT PCR POSITIVE (A) NEGATIVE Final    Comment: (NOTE) SARS-CoV-2 target nucleic acids are DETECTED.  The SARS-CoV-2 RNA is generally detectable in upper respiratory specimens during the acute phase of infection. Positive results are indicative of the presence of the identified virus, but do not rule out bacterial infection or co-infection with other pathogens not detected by the test. Clinical correlation with patient history and other diagnostic information is necessary to determine patient infection status. The expected result is Negative.  Fact Sheet for Patients: EntrepreneurPulse.com.au  Fact Sheet for Healthcare Providers: IncredibleEmployment.be  This test is not yet approved or cleared by the [place] FDA and  has been authorized for  detection and/or diagnosis of SARS-CoV-2 by FDA under an Emergency Use Authorization (EUA).  This EUA will remain in effect (meaning this test can be used) for the duration of  the COVID-19 declaration under Section 564(b)(1) of the A ct, 21 U.S.C. section 360bbb-3(b)(1), unless the authorization is terminated or revoked sooner.     Influenza A by PCR NEGATIVE NEGATIVE Final   Influenza B by PCR NEGATIVE NEGATIVE Final    Comment: (NOTE) The Xpert Xpress SARS-CoV-2/FLU/RSV plus assay is intended as an aid in the diagnosis of influenza from Nasopharyngeal swab specimens and should not be used as a sole basis for treatment. Nasal washings and aspirates are unacceptable for Xpert Xpress SARS-CoV-2/FLU/RSV testing.  Fact Sheet for Patients: EntrepreneurPulse.com.au  Fact Sheet for Healthcare Providers: IncredibleEmployment.be  This test is not yet approved or cleared by the [place] FDA and has been authorized for detection and/or diagnosis of SARS-CoV-2 by FDA under an Emergency Use Authorization (EUA). This EUA will remain in effect (meaning this test can be used) for the duration of the COVID-19 declaration under Section 564(b)(1) of the Act, 21 U.S.C. section 360bbb-3(b)(1), unless the authorization is terminated or revoked.  Performed at [facility], [employer] [address], [place], [place] [address]      Labs: BNP (last 3 results) Recent Labs    10/01/22 0758  BNP 0000000*   Basic Metabolic Panel: Recent Labs  Lab 09/27/22 0520 09/28/22 0333 09/29/22 0449 09/30/22 0400 10/01/22  0758  NA 136 133* 135 134* 134*  K 4.3 4.4 4.5 4.5 4.8  CL 104 99 101 98 97*  CO2 25 25 26 26 30   GLUCOSE 151* 129* 112* 126* 120*  BUN 17 19 14 18 19   CREATININE 0.70 0.68 0.40* 0.52 0.58  CALCIUM 8.7* 9.0 8.9 9.0 9.4  MG 1.7 1.7 2.0 2.0 2.1  PHOS 4.0 3.2 2.7 2.8 3.2   Liver Function Tests: Recent Labs  Lab 09/27/22 0520  09/28/22 0333 09/29/22 0449 09/30/22 0400 10/01/22 0758  AST 19 20 18 16 25   ALT 19 18 19 20  39  ALKPHOS 52 58 49 51 54  BILITOT 0.4 0.3 0.4 0.4 0.6  PROT 5.8* 6.7 5.9* 6.1* 6.6  ALBUMIN 3.0* 3.2* 3.0* 2.9* 3.2*   Recent Labs  Lab 09/26/22 0947  LIPASE 35   No results for input(s): "AMMONIA" in the last 168 hours. CBC: Recent Labs  Lab 09/27/22 0520 09/28/22 0333 09/29/22 0449 09/30/22 0400 10/01/22 0758  WBC 10.7* 14.6* 12.4* 11.7* 12.7*  NEUTROABS 9.1* 12.2* 9.7* 9.2* 9.9*  HGB 11.6* 12.8 12.3 12.7 12.6  HCT 36.4 39.1 37.7 39.8 39.6  MCV 87.1 86.1 85.1 87.5 86.5  PLT 176 204 203 193 253   Cardiac Enzymes: No results for input(s): "CKTOTAL", "CKMB", "CKMBINDEX", "TROPONINI" in the last 168 hours. BNP: Invalid input(s): "POCBNP" CBG: No results for input(s): "GLUCAP" in the last 168 hours. D-Dimer No results for input(s): "DDIMER" in the last 72 hours. Hgb A1c No results for input(s): "HGBA1C" in the last 72 hours. Lipid Profile No results for input(s): "CHOL", "HDL", "LDLCALC", "TRIG", "CHOLHDL", "LDLDIRECT" in the last 72 hours. Thyroid function studies No results for input(s): "TSH", "T4TOTAL", "T3FREE", "THYROIDAB" in the last 72 hours.  Invalid input(s): "FREET3" Anemia work up Recent Labs    09/30/22 0400 10/01/22 0758  FERRITIN 433* 562*   Urinalysis    Component Value Date/Time   COLORURINE YELLOW 09/17/2020 1145   APPEARANCEUR HAZY (A) 09/17/2020 1145   LABSPEC 1.012 09/17/2020 1145   PHURINE 5.0 09/17/2020 1145   GLUCOSEU NEGATIVE 09/17/2020 1145   HGBUR SMALL (A) 09/17/2020 1145   BILIRUBINUR NEGATIVE 09/17/2020 1145   [place] 09/17/2020 1145   PROTEINUR NEGATIVE 09/17/2020 1145   NITRITE NEGATIVE 09/17/2020 1145   LEUKOCYTESUR SMALL (A) 09/17/2020 1145   Sepsis Labs Recent Labs  Lab 09/28/22 0333 09/29/22 0449 09/30/22 0400 10/01/22 0758  WBC 14.6* 12.4* 11.7* 12.7*   Microbiology Recent Results (from the past 240  hour(s))  Resp Panel by RT-PCR (Flu A&B, Covid) Anterior Nasal Swab     Status: Abnormal   Collection Time: 09/26/22  9:47 AM   Specimen: Anterior Nasal Swab  Result Value Ref Range Status   SARS Coronavirus 2 by RT PCR POSITIVE (A) NEGATIVE Final    Comment: (NOTE) SARS-CoV-2 target nucleic acids are DETECTED.  The SARS-CoV-2 RNA is generally detectable in upper respiratory specimens during the acute phase of infection. Positive results are indicative of the presence of the identified virus, but do not rule out bacterial infection or co-infection with other pathogens not detected by the test. Clinical correlation with patient history and other diagnostic information is necessary to determine patient infection status. The expected result is Negative.  Fact Sheet for Patients: EntrepreneurPulse.com.au  Fact Sheet for Healthcare Providers: IncredibleEmployment.be  This test is not yet approved or cleared by the [place] FDA and  has been authorized for detection and/or diagnosis of SARS-CoV-2 by FDA under an  Emergency Use Authorization (EUA).  This EUA will remain in effect (meaning this test can be used) for the duration of  the COVID-19 declaration under Section 564(b)(1) of the A ct, 21 U.S.C. section 360bbb-3(b)(1), unless the authorization is terminated or revoked sooner.     Influenza A by PCR NEGATIVE NEGATIVE Final   Influenza B by PCR NEGATIVE NEGATIVE Final    Comment: (NOTE) The Xpert Xpress SARS-CoV-2/FLU/RSV plus assay is intended as an aid in the diagnosis of influenza from Nasopharyngeal swab specimens and should not be used as a sole basis for treatment. Nasal washings and aspirates are unacceptable for Xpert Xpress SARS-CoV-2/FLU/RSV testing.  Fact Sheet for Patients: EntrepreneurPulse.com.au  Fact Sheet for Healthcare Providers: IncredibleEmployment.be  This test is not yet  approved or cleared by the [place] FDA and has been authorized for detection and/or diagnosis of SARS-CoV-2 by FDA under an Emergency Use Authorization (EUA). This EUA will remain in effect (meaning this test can be used) for the duration of the COVID-19 declaration under Section 564(b)(1) of the Act, 21 U.S.C. section 360bbb-3(b)(1), unless the authorization is terminated or revoked.  Performed at [facility], [place] [address] [place], [place] [address]      Time coordinating discharge: Over 30 minutes  SIGNED:   Shawna Clamp, MD  [facility] 10/02/2022, 11:14 AM Pager   If 7PM-7AM, please contact night-coverage

## 2022-10-02 NOTE — Plan of Care (Signed)
Patient resting, minimal coughing while sleeping.  Tussinex held until patient awakens and asks for medication.  Iv intact and flushes. No s/s of distress during assessment.  Reports soreness in upper abdomen from coughing.

## 2022-10-02 NOTE — Discharge Instructions (Signed)
Advised to follow-up with primary care physician in 1 week. Advised to take Decadron 6 mg daily for 4 more days. Patient has completed molnupiravir therapy for 5 days. Advised to continue DuoNeb nebulization as needed. Advised to take Lasix 20 mg daily.

## 2022-10-02 NOTE — TOC Transition Note (Signed)
Transition of Care [facility]) - CM/SW Discharge Note   Patient Details  Name: [name] MRN: [id number] Date of Birth: 1933/10/21  Transition [facility] Care [facility]) CM/SW Contact:  Golda Acre, RN Phone Number: 10/02/2022, 11:03 AM   Clinical Narrative:    Patient dcd to go home .  Does not meet glines for o2 at home. Saturating at 94-97% on room air.   Final next level of care: Home w Home Health Services Barriers to Discharge: Continued Medical Work up   Patient Goals and CMS Choice Patient states their goals for this hospitalization and ongoing recovery are:: not stated CMS Medicare.gov Compare Post Acute Care list provided to:: Patient Choice offered to / list presented to : Patient  Discharge Placement                       Discharge Plan and Services   Discharge Planning Services: CM Consult            DME Arranged: Oxygen DME Agency: [employer] Date DME Agency Contacted: 10/02/22 Time DME Agency Contacted: 1028 Representative spoke with at DME Agency: [email or web address]            Social Determinants of Health (SDOH) Interventions     Readmission Risk Interventions   No data to display

## 2022-11-24 ENCOUNTER — Emergency Department (HOSPITAL_COMMUNITY): Payer: Medicare Other

## 2022-11-24 ENCOUNTER — Inpatient Hospital Stay (HOSPITAL_COMMUNITY)
Admission: EM | Admit: 2022-11-24 | Discharge: 2022-12-04 | DRG: 193 | Disposition: A | Discharge status: Home Health | Payer: Medicare Other | Attending: Internal Medicine | Admitting: Internal Medicine

## 2022-11-24 ENCOUNTER — Encounter (HOSPITAL_COMMUNITY): Payer: Self-pay

## 2022-11-24 ENCOUNTER — Other Ambulatory Visit: Payer: Self-pay

## 2022-11-24 DIAGNOSIS — Z8673 Personal history of transient ischemic attack (TIA), and cerebral infarction without residual deficits: Secondary | ICD-10-CM | POA: Diagnosis not present

## 2022-11-24 DIAGNOSIS — I110 Hypertensive heart disease with heart failure: Secondary | ICD-10-CM | POA: Diagnosis present

## 2022-11-24 DIAGNOSIS — I4819 Other persistent atrial fibrillation: Secondary | ICD-10-CM | POA: Diagnosis present

## 2022-11-24 DIAGNOSIS — J449 Chronic obstructive pulmonary disease, unspecified: Secondary | ICD-10-CM | POA: Diagnosis present

## 2022-11-24 DIAGNOSIS — E119 Type 2 diabetes mellitus without complications: Secondary | ICD-10-CM | POA: Diagnosis present

## 2022-11-24 DIAGNOSIS — D6869 Other thrombophilia: Secondary | ICD-10-CM | POA: Diagnosis not present

## 2022-11-24 DIAGNOSIS — J1000 Influenza due to other identified influenza virus with unspecified type of pneumonia: Principal | ICD-10-CM | POA: Diagnosis present

## 2022-11-24 DIAGNOSIS — J441 Chronic obstructive pulmonary disease with (acute) exacerbation: Secondary | ICD-10-CM | POA: Diagnosis not present

## 2022-11-24 DIAGNOSIS — I10 Essential (primary) hypertension: Secondary | ICD-10-CM | POA: Diagnosis present

## 2022-11-24 DIAGNOSIS — E785 Hyperlipidemia, unspecified: Secondary | ICD-10-CM | POA: Diagnosis present

## 2022-11-24 DIAGNOSIS — K219 Gastro-esophageal reflux disease without esophagitis: Secondary | ICD-10-CM | POA: Diagnosis present

## 2022-11-24 DIAGNOSIS — Z6832 Body mass index (BMI) 32.0-32.9, adult: Secondary | ICD-10-CM | POA: Diagnosis not present

## 2022-11-24 DIAGNOSIS — E871 Hypo-osmolality and hyponatremia: Secondary | ICD-10-CM | POA: Diagnosis present

## 2022-11-24 DIAGNOSIS — Z79899 Other long term (current) drug therapy: Secondary | ICD-10-CM

## 2022-11-24 DIAGNOSIS — I480 Paroxysmal atrial fibrillation: Secondary | ICD-10-CM | POA: Diagnosis not present

## 2022-11-24 DIAGNOSIS — J9811 Atelectasis: Secondary | ICD-10-CM | POA: Diagnosis present

## 2022-11-24 DIAGNOSIS — E669 Obesity, unspecified: Secondary | ICD-10-CM | POA: Diagnosis present

## 2022-11-24 DIAGNOSIS — R54 Age-related physical debility: Secondary | ICD-10-CM | POA: Diagnosis present

## 2022-11-24 DIAGNOSIS — Z888 Allergy status to other drugs, medicaments and biological substances status: Secondary | ICD-10-CM | POA: Diagnosis not present

## 2022-11-24 DIAGNOSIS — R918 Other nonspecific abnormal finding of lung field: Secondary | ICD-10-CM

## 2022-11-24 DIAGNOSIS — Z7722 Contact with and (suspected) exposure to environmental tobacco smoke (acute) (chronic): Secondary | ICD-10-CM | POA: Diagnosis present

## 2022-11-24 DIAGNOSIS — J189 Pneumonia, unspecified organism: Secondary | ICD-10-CM | POA: Diagnosis present

## 2022-11-24 DIAGNOSIS — Z833 Family history of diabetes mellitus: Secondary | ICD-10-CM

## 2022-11-24 DIAGNOSIS — Z8616 Personal history of COVID-19: Secondary | ICD-10-CM | POA: Diagnosis not present

## 2022-11-24 DIAGNOSIS — Z7901 Long term (current) use of anticoagulants: Secondary | ICD-10-CM | POA: Diagnosis not present

## 2022-11-24 DIAGNOSIS — J9601 Acute respiratory failure with hypoxia: Secondary | ICD-10-CM | POA: Diagnosis not present

## 2022-11-24 DIAGNOSIS — E875 Hyperkalemia: Secondary | ICD-10-CM | POA: Diagnosis not present

## 2022-11-24 DIAGNOSIS — J440 Chronic obstructive pulmonary disease with acute lower respiratory infection: Secondary | ICD-10-CM | POA: Diagnosis present

## 2022-11-24 DIAGNOSIS — J101 Influenza due to other identified influenza virus with other respiratory manifestations: Secondary | ICD-10-CM | POA: Diagnosis present

## 2022-11-24 DIAGNOSIS — I5032 Chronic diastolic (congestive) heart failure: Secondary | ICD-10-CM | POA: Diagnosis present

## 2022-11-24 DIAGNOSIS — Z1152 Encounter for screening for COVID-19: Secondary | ICD-10-CM

## 2022-11-24 DIAGNOSIS — I509 Heart failure, unspecified: Principal | ICD-10-CM

## 2022-11-24 DIAGNOSIS — I4891 Unspecified atrial fibrillation: Secondary | ICD-10-CM

## 2022-11-24 LAB — CBC
HCT: 33.2 % — ABNORMAL LOW (ref 36.0–46.0)
Hemoglobin: 10.8 g/dL — ABNORMAL LOW (ref 12.0–15.0)
MCH: 27.9 pg (ref 26.0–34.0)
MCHC: 32.5 g/dL (ref 30.0–36.0)
MCV: 85.8 fL (ref 80.0–100.0)
Platelets: 313 10*3/uL (ref 150–400)
RBC: 3.87 MIL/uL (ref 3.87–5.11)
RDW: 14.2 % (ref 11.5–15.5)
WBC: 11.1 10*3/uL — ABNORMAL HIGH (ref 4.0–10.5)
nRBC: 0 % (ref 0.0–0.2)

## 2022-11-24 LAB — BASIC METABOLIC PANEL
Anion gap: 9 (ref 5–15)
BUN: 16 mg/dL (ref 8–23)
CO2: 22 mmol/L (ref 22–32)
Calcium: 9.2 mg/dL (ref 8.9–10.3)
Chloride: 93 mmol/L — ABNORMAL LOW (ref 98–111)
Creatinine, Ser: 0.75 mg/dL (ref 0.44–1.00)
GFR, Estimated: >60 mL/min (ref >60)
Glucose, Bld: 132 mg/dL — ABNORMAL HIGH (ref 70–99)
Potassium: 5.5 mmol/L — ABNORMAL HIGH (ref 3.5–5.1)
Sodium: 124 mmol/L — ABNORMAL LOW (ref 135–145)

## 2022-11-24 LAB — BRAIN NATRIURETIC PEPTIDE: B Natriuretic Peptide: 390.2 pg/mL — ABNORMAL HIGH (ref 0.0–100.0)

## 2022-11-24 MED ORDER — ACETAMINOPHEN 650 MG RE SUPP: 650.0000 mg | Freq: Four times a day (QID) | RECTAL | Status: DC | PRN

## 2022-11-24 MED ORDER — ONDANSETRON HCL 4 MG/2ML IJ SOLN
4.0000 mg | Freq: Four times a day (QID) | INTRAMUSCULAR | Status: DC | PRN
  Administered 2022-11-28: 4 mg via INTRAVENOUS
  Filled 2022-11-24: qty 2

## 2022-11-24 MED ORDER — ACETAMINOPHEN 325 MG PO TABS
650.0000 mg | ORAL_TABLET | Freq: Four times a day (QID) | ORAL | Status: DC | PRN
  Administered 2022-11-25 – 2022-12-02 (×6): 650 mg via ORAL
  Filled 2022-11-24 (×6): qty 2

## 2022-11-24 MED ORDER — ONDANSETRON HCL 4 MG PO TABS: 4.0000 mg | ORAL_TABLET | Freq: Four times a day (QID) | ORAL | Status: DC | PRN

## 2022-11-24 MED ORDER — GUAIFENESIN ER 600 MG PO TB12
600.0000 mg | ORAL_TABLET | Freq: Two times a day (BID) | ORAL | Status: DC
  Administered 2022-11-25 – 2022-12-04 (×20): 600 mg via ORAL
  Filled 2022-11-24 (×20): qty 1

## 2022-11-24 MED ORDER — LEVOFLOXACIN IN D5W 750 MG/150ML IV SOLN
750.0000 mg | INTRAVENOUS | Status: DC
  Administered 2022-11-24: 750 mg via INTRAVENOUS
  Filled 2022-11-24: qty 150

## 2022-11-24 MED ORDER — IPRATROPIUM-ALBUTEROL 0.5-2.5 (3) MG/3ML IN SOLN
3.0000 mL | Freq: Four times a day (QID) | RESPIRATORY_TRACT | Status: DC
  Administered 2022-11-25: 3 mL via RESPIRATORY_TRACT

## 2022-11-24 MED ORDER — METHYLPREDNISOLONE SODIUM SUCC 40 MG IJ SOLR
40.0000 mg | Freq: Two times a day (BID) | INTRAMUSCULAR | Status: DC
  Administered 2022-11-25: 40 mg via INTRAVENOUS
  Filled 2022-11-24: qty 1

## 2022-11-24 MED ORDER — OSELTAMIVIR PHOSPHATE 75 MG PO CAPS
75.0000 mg | ORAL_CAPSULE | ORAL | Status: AC
  Administered 2022-11-25: 75 mg via ORAL
  Filled 2022-11-24: qty 1

## 2022-11-24 MED ORDER — SENNOSIDES-DOCUSATE SODIUM 8.6-50 MG PO TABS: 1.0000 | ORAL_TABLET | Freq: Every evening | ORAL | Status: DC | PRN

## 2022-11-24 MED ORDER — SODIUM CHLORIDE 0.9 % IV SOLN
500.0000 mg | INTRAVENOUS | Status: AC
  Administered 2022-11-25 – 2022-11-29 (×5): 500 mg via INTRAVENOUS
  Filled 2022-11-24 (×5): qty 5

## 2022-11-24 MED ORDER — ALBUTEROL SULFATE HFA 108 (90 BASE) MCG/ACT IN AERS
2.0000 | INHALATION_SPRAY | RESPIRATORY_TRACT | Status: DC | PRN
  Administered 2022-11-24: 2 via RESPIRATORY_TRACT
  Filled 2022-11-24: qty 6.7

## 2022-11-24 MED ORDER — DILTIAZEM HCL-DEXTROSE 125-5 MG/125ML-% IV SOLN (PREMIX)
5.0000 mg/h | INTRAVENOUS | Status: DC
  Administered 2022-11-24: 5 mg/h via INTRAVENOUS
  Administered 2022-11-25: 12.5 mg/h via INTRAVENOUS
  Filled 2022-11-24 (×2): qty 125

## 2022-11-24 MED ORDER — SODIUM CHLORIDE 0.9% FLUSH
3.0000 mL | Freq: Two times a day (BID) | INTRAVENOUS | Status: DC
  Administered 2022-11-25 – 2022-12-04 (×14): 3 mL via INTRAVENOUS

## 2022-11-24 MED ORDER — DILTIAZEM LOAD VIA INFUSION
10.0000 mg | Freq: Once | INTRAVENOUS | Status: AC
  Administered 2022-11-24: 10 mg via INTRAVENOUS
  Filled 2022-11-24: qty 10

## 2022-11-24 MED ORDER — ALBUTEROL SULFATE (2.5 MG/3ML) 0.083% IN NEBU
2.5000 mg | INHALATION_SOLUTION | RESPIRATORY_TRACT | Status: DC | PRN
  Filled 2022-11-24: qty 3

## 2022-11-24 MED ORDER — SODIUM CHLORIDE 0.9 % IV SOLN
2.0000 g | INTRAVENOUS | Status: AC
  Administered 2022-11-25 – 2022-11-29 (×5): 2 g via INTRAVENOUS
  Filled 2022-11-24 (×5): qty 20

## 2022-11-24 MED ORDER — FUROSEMIDE 10 MG/ML IJ SOLN
40.0000 mg | Freq: Once | INTRAMUSCULAR | Status: AC
  Administered 2022-11-24: 40 mg via INTRAVENOUS
  Filled 2022-11-24: qty 4

## 2022-11-24 MED ORDER — AEROCHAMBER Z-STAT PLUS/MEDIUM MISC
Status: AC
  Filled 2022-11-24: qty 1

## 2022-11-24 NOTE — ED Provider Notes (Signed)
[facility]-EMERGENCY DEPT Provider Note   CSN: [id number] Arrival date & time: 11/24/22  2103     History  Chief Complaint  Patient presents with   Shortness of Breath    [name] is a 89 y.o. female with a history of paroxysmal A-fib, on Eliquis, presenting to ED with generalized fatigue and shortness of breath.  Supplemental history provided by her daughter and son-in-law at bedside.  The patient has been having flulike symptoms for 8 days, beginning last Sunday.  She was diagnosed with influenza B 5 days ago.  She was outside the window for Tamiflu, according to her family.  At the same time as her diagnosis she had an x-ray which showed a right upper lobe infiltrate concerning for pneumonia, and she was given Rocephin and started on doxycycline and cefpodixime at home, which she has been taking for the past 5 days.  However she comes in today with worsening labored breathing.  Her family says she does not wear oxygen at home.  She feels like she is in a difficult time taking a full breath and continues to have coughing and feel fatigued.  Her family noted that the patient went "back into rapid A-fib", and they gave her several rounds of diltiazem at home, improving her heart rate from about 1 50-120, but it is still rapid.  Typically her son says the patient is in sinus rhythm.  She has been compliant with her medications  Patient does have chronic edema of the lower extremities and does not feel that her swelling is changed.  External records reviewed she had an echocardiogram in November 2023 with an EF of 60 to 65%,  HPI     Home Medications Prior to Admission medications   Medication Sig Start Date End Date Taking? Authorizing Provider  acetaminophen (TYLENOL) 500 MG tablet Take 2 tablets (1,000 mg total) by mouth every 8 (eight) hours. 09/16/20   Lanney Gins, PA-C  albuterol (VENTOLIN HFA) 108 (90 Base) MCG/ACT inhaler Inhale 2 puffs into the lungs  every 4 (four) hours as needed. 06/08/21   [provider]  amLODipine (NORVASC) 10 MG tablet Take 10 mg by mouth every evening.     [provider]  Ascorbic Acid (VITAMIN C) 1000 MG tablet Take 1,000 mg by mouth daily.    [provider]  benazepril (LOTENSIN) 20 MG tablet Take 20 mg by mouth 2 (two) times daily.    [provider]  cetirizine (ZYRTEC) 10 MG tablet Take 10 mg by mouth daily.     [provider]  Cholecalciferol (VITAMIN D3) 125 MCG (5000 UT) TABS Take 5,000 Units by mouth daily.    [provider]  cyanocobalamin 1000 MCG tablet Take 1 tablet by mouth every other day. Vitamin B12    [provider]  diltiazem (CARDIZEM) 30 MG tablet Take 1-2 tablets (30-60 mg total) by mouth 4 (four) times daily as needed (for heart rates greater than 100 bpm during atrial fibrillation). Patient not taking: Reported on 09/26/2022 06/30/21   Allred, Fayrene Fearing, MD  ELIQUIS 5 MG TABS tablet TAKE 1 TABLET BY MOUTH TWICE A DAY 07/16/22   Allred, Fayrene Fearing, MD  ferrous sulfate (FERROUSUL) 325 (65 FE) MG tablet Take 1 tablet (325 mg total) by mouth 3 (three) times daily with meals for 14 days. Patient taking differently: Take 325 mg by mouth 2 (two) times daily with a meal. 09/16/20 09/26/22  Lanney Gins, PA-C  furosemide (LASIX) 20 MG  tablet Take 1 tablet (20 mg total) by mouth daily. 10/02/22 10/02/23  Cipriano Bunker, MD  gabapentin (NEURONTIN) 300 MG capsule Take 300 mg by mouth See admin instructions. Take one capsule (300 mg) in the morning and two capsules (600 mg) in the evening.    [provider]  ipratropium-albuterol (DUONEB) 0.5-2.5 (3) MG/3ML SOLN Take 3 mLs by nebulization every 6 (six) hours as needed. 07/07/22   [provider]  KLOR-CON M20 20 MEQ tablet Take 20 mEq by mouth daily with supper. 07/19/22   [provider]  labetalol (NORMODYNE) 200 MG tablet Take 200 mg by mouth 2 (two) times daily.    [provider]  methocarbamol (ROBAXIN) 750 MG tablet Take 750 mg by mouth 3 (three) times daily as needed. 03/06/22   [provider]  mirtazapine (REMERON) 15 MG tablet Take 15 mg by mouth at bedtime. 02/27/22   [provider]  Multiple Vitamin (MULTIVITAMIN WITH MINERALS) TABS tablet Take 1 tablet by mouth daily.    [provider]  Multiple Vitamins-Minerals (PRESERVISION AREDS 2 PO) Take 1 tablet by mouth daily. [employer], Historical, MD  Omega-3 Fatty Acids (OMEGA-3 FISH OIL PO) Take 1 capsule by mouth daily.    [provider]  pantoprazole (PROTONIX) 40 MG tablet Take 40 mg by mouth daily. 05/13/21   [provider]  pravastatin (PRAVACHOL) 20 MG tablet Take 20 mg by mouth at bedtime.    [provider]  spironolactone (ALDACTONE) 25 MG tablet Take 25 mg by mouth daily.     [provider]  sucralfate (CARAFATE) 1 g tablet Take 1 g by mouth 4 (four) times daily -  with meals and at bedtime.    [provider]      Allergies    Keflex [cephalexin]    Review of Systems   Review of Systems  Physical Exam Updated Vital Signs BP (!) 140/107   Pulse (!) 27   Temp 97.6 F (36.4 C) (Oral)   Resp (!) 44   Ht 5\' 4"  (1.626 m)   Wt 87 kg   SpO2 98%   BMI 32.92 kg/m  Physical Exam Constitutional:      General: She is not in acute distress. HENT:     Head: Normocephalic and atraumatic.  Eyes:     Conjunctiva/sclera: Conjunctivae normal.     Pupils: Pupils are equal, round, and reactive to light.  Cardiovascular:     Rate and Rhythm: Normal rate and regular rhythm.  Pulmonary:     Effort: Pulmonary effort is normal. No respiratory distress.     Comments: RR 30, speaking in full sentences Diffuse rhonchi bilaterally On 2L [place] Abdominal:     General: There is no distension.     Tenderness: There is no abdominal tenderness.  Musculoskeletal:     Right lower leg: Edema present.     Left lower  leg: Edema present.  Skin:    General: Skin is warm and dry.  Neurological:     General: No focal deficit present.     Mental Status: She is alert. Mental status is at baseline.  Psychiatric:        Mood and Affect: Mood normal.        Behavior: Behavior normal.     ED Results / Procedures / Treatments   Labs (all labs ordered are listed, but only abnormal results are displayed) Labs Reviewed  BASIC METABOLIC PANEL - Abnormal;  Notable for the following components:      Result Value   Sodium 124 (*)    Potassium 5.5 (*)    Chloride 93 (*)    Glucose, Bld 132 (*)    All other components within normal limits  CBC - Abnormal; Notable for the following components:   WBC 11.1 (*)    Hemoglobin 10.8 (*)    HCT 33.2 (*)    All other components within normal limits  RESP PANEL BY RT-PCR (RSV, FLU A&B, COVID)  RVPGX2  BRAIN NATRIURETIC PEPTIDE    EKG EKG Interpretation  Date/Time:  Saturday November 24 2022 21:13:43 EST Ventricular Rate:  106 PR Interval:    QRS Duration: 99 QT Interval:  341 QTC Calculation: 453 R Axis:   4 Text Interpretation: Atrial fibrillation Confirmed by Alvester Chou [phone]) on 11/24/2022 9:16:07 PM  Radiology DG Chest 2 View  Result Date: 11/24/2022 CLINICAL DATA:  Shortness of breath. EXAM: CHEST - 2 VIEW COMPARISON:  November 20, 2022 FINDINGS: The heart size and mediastinal contours are within normal limits. The lungs are hyperinflated with mild right-sided volume loss noted. Chronic appearing increased interstitial lung markings are seen. Moderate severity right upper lobe infiltrate is also present. This is increased in severity when compared to the prior study. Mild areas of atelectasis and/or infiltrate are also seen within the bilateral lung bases. There is a small right pleural effusion. No pneumothorax is identified. The visualized skeletal structures are unremarkable. IMPRESSION: 1. COPD with moderate severity right upper lobe infiltrate. 2. Mild  bibasilar atelectasis and/or infiltrate. 3. Small right pleural effusion. Electronically Signed   By: Aram Candela M.D.   On: 11/24/2022 22:46    Procedures .Critical Care  Performed by: Terald Sleeper, MD Authorized by: Terald Sleeper, MD   Critical care provider statement:    Critical care time (minutes):  45   Critical care time was exclusive of:  Separately billable procedures and treating other patients   Critical care was necessary to treat or prevent imminent or life-threatening deterioration of the following conditions:  Respiratory failure and circulatory failure   Critical care was time spent personally by me on the following activities:  Ordering and performing treatments and interventions, ordering and review of laboratory studies, ordering and review of radiographic studies, pulse oximetry, review of old charts, examination of patient and evaluation of patient's response to treatment Comments:     IV diuresis for suspected congestive heart failure exacerbation, IV antibiotics for persistent pneumonia, IV rate control medication for A-fib with RVR     Medications Ordered in ED Medications  levofloxacin (LEVAQUIN) IVPB 750 mg (750 mg Intravenous New Bag/Given 11/24/22 2258)  diltiazem (CARDIZEM) 1 mg/mL load via infusion 10 mg (10 mg Intravenous Bolus from Bag 11/24/22 2301)    And  diltiazem (CARDIZEM) 125 mg in dextrose 5% 125 mL (1 mg/mL) infusion (5 mg/hr Intravenous New Bag/Given 11/24/22 2257)  furosemide (LASIX) injection 40 mg (40 mg Intravenous Given 11/24/22 2258)  aerochamber Z-Stat Plus/medium (  Given 11/24/22 2301)    ED Course/ Medical Decision Making/ A&P Clinical Course as of 11/24/22 2319  Sat Nov 24, 2022  2257 BMP hemolyzed- for hyperK [MT]  2318 Admitted to hospitalist Dr Allena Katz [MT]    Clinical Course User Index [MT] Terald Sleeper, MD                           Medical Decision Making Amount  and/or Complexity of Data Reviewed Labs:  ordered. Radiology: ordered.  Risk Prescription drug management. Decision regarding hospitalization.   This patient presents to the ED with concern for shortness of breath. This involves an extensive number of treatment options, and is a complaint that carries with it a high risk of complications and morbidity.  The differential diagnosis includes pleural effusion versus congestive heart failure versus pneumonia persistent versus anemia versus other  Co-morbidities that complicate the patient evaluation: History of A-fib at high risk of A-fib exacerbation and cardiac complication  Additional history obtained from patient's daughter and son-in-law at bedside  External records from outside source obtained and reviewed including cardiogram from November as noted above  I ordered and personally interpreted labs.  The pertinent results include: BMP hemolyzed, the repeat pending at the time of admission.  Creatinine within normal limits.  White blood cell count 11.1.  Hemoglobin near baseline at 10.8.  I ordered imaging studies including x-ray of the chest I independently visualized and interpreted imaging which showed persistent right upper lobe mass I agree with the radiologist interpretation  The patient was maintained on a cardiac monitor.  I personally viewed and interpreted the cardiac monitored which showed an underlying rhythm of: a fib HR 100-130 bpm  Per my interpretation the patient's ECG shows fib with RVR with no acute ischemic findings  I have reviewed the patients home medicines and have made adjustments as needed  Test Considered: Lower suspicion of acute pulmonary embolism in the setting and not feel she needed an emergent CT PE study.  After the interventions noted above, I reevaluated the patient and found that they have: stayed the same  *  She is now on day 8 of influenza symptoms.  At this time I suspect the patient experiencing congestive heart failure  exacerbation in the setting of uncontrolled A-fib, which may have been triggered by her influenza and superimposed pneumonia.  She has been now on 5 or 6 days of antibiotics for suspected community pneumonia, and is not clear whether she has failed these antibiotics or not.  I think is reasonable to switch her to a different class of antibiotic, Levaquin, at the time of admission.  She will also need IV diuresis that she demonstrate signs of volume overload on exam, which I think is making her hypoxic and short of breath.  She will need diltiazem rate control medication for her A-fib while in the hospital.  The patient does not have an advanced directive or living will per her report.  Her daughter is her next of kin.  They have not had any CODE STATUS or end-of-life discussion.  As for now they report the patient is full code, but they would need more time to discuss this.  Dispostion:  After consideration of the diagnostic results and the patients response to treatment, I feel that the patent would benefit from medical admission         Final Clinical Impression(s) / ED Diagnoses Final diagnoses:  Acute on chronic congestive heart failure, unspecified heart failure type ([place])  Atrial fibrillation with RVR ([place])  Influenza B  Mass of right lung    Rx / DC Orders ED Discharge Orders     None         Wyvonnia Dusky, MD 11/24/22 2320

## 2022-11-24 NOTE — H&P (Signed)
History and Physical    [name] WVP:[id number] DOB: 01-20-1933 DOA: 11/24/2022  PCP: Hadley Pen, MD  Patient coming from: Home  I have personally briefly reviewed patient's old medical records in [facility]  Chief Complaint: Dyspnea, A-fib  HPI: [name] is a 89 y.o. female with medical history significant for PAF on Eliquis, COPD not on supplemental O2 at home, TIA, HTN, HLD who presented to the ED for evaluation of dyspnea and A-fib.  History is supplemented by family at bedside.  Patient has been having flulike symptoms for about 1 week.  She was seen by primary care on 11/20/2022 at which time testing was positive for influenza B.  Chest x-ray was also obtained which showed a right upper lobe infiltrate.  She was started on antibiotics with IM ceftriaxone given in clinic and prescribed cefpodoxime and doxycycline for home.  Despite antibiotics patient has been having progressively worsening shortness of breath with cough.  She has been having upper airway rattling noises.  She has been fatigued.  She was noted to have gone into rapid A-fib with heart rate up to 150s per family.  She was given several doses of oral diltiazem which brought her heart rate down to around 120.  Due to worsening dyspnea and uncontrolled heart rate family brought her to the ED for further evaluation and management.  ED Course  Labs/Imaging on admission: I have personally reviewed following labs and imaging studies.  Initial vitals showed BP 132/93, pulse 105, RR 30, temp 97.6 F, SpO2 88% on room air per ED triage documentation.  Patient placed on 2 L O2 via [place] with subsequent SpO2 95%.  Labs show WBC 11.1, hemoglobin 10.8, platelets 313,000, sodium 124, potassium 5.5 (hemolysis noted), bicarb 22, BUN 16, creatinine 0.75, serum glucose 132.  2 view chest x-ray showed moderate severity right upper lobe infiltrate, mild bibasilar atelectasis, small right pleural effusion, emphysematous  changes.  Patient was given IV Lasix 40 mg, IV Levaquin, and started on IV diltiazem infusion.  The hospitalist service was consulted to admit for further evaluation and management.  Review of Systems: All systems reviewed and are negative except as documented in history of present illness above.   Past Medical History:  Diagnosis Date   Arthritis    Atrial fibrillation, transient (HCC)    post op hip surgery; May 2021   Diverticulitis    Dyspnea    with exertion    GERD (gastroesophageal reflux disease)    Hyperlipidemia    Hypertension    Stroke (HCC)    hx opf mini strokes    TIA (transient ischemic attack)     Past Surgical History:  Procedure Laterality Date   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/13/2020   Procedure: CONVERSION TO LEFT TOTAL HIP-POSTERIOR APPROACH;  Surgeon: Durene Romans, MD;  Location: [facility];  Service: Orthopedics;  Laterality: Left;  90 mins   HIP FRACTURE SURGERY Left 04/16/2020   KNEE SURGERY     WRIST SURGERY      Social History:  reports that she has never smoked. She has never used smokeless tobacco. She reports that she does not drink alcohol and does not use drugs.  Allergies  Allergen Reactions   Keflex [Cephalexin] Other (See Comments)    "makes pt feel really bad"    Family History  Problem Relation Age of Onset   Diabetes Mother      Prior to Admission medications   Medication Sig Start Date End Date  Taking? Authorizing Provider  acetaminophen (TYLENOL) 500 MG tablet Take 2 tablets (1,000 mg total) by mouth every 8 (eight) hours. 09/16/20   Lanney Gins, PA-C  albuterol (VENTOLIN HFA) 108 (90 Base) MCG/ACT inhaler Inhale 2 puffs into the lungs every 4 (four) hours as needed. 06/08/21   [provider]  amLODipine (NORVASC) 10 MG tablet Take 10 mg by mouth every evening.     [provider]  Ascorbic Acid (VITAMIN C) 1000 MG tablet Take 1,000 mg by mouth daily.    [provider]  benazepril (LOTENSIN) 20  MG tablet Take 20 mg by mouth 2 (two) times daily.    [provider]  cetirizine (ZYRTEC) 10 MG tablet Take 10 mg by mouth daily.     [provider]  Cholecalciferol (VITAMIN D3) 125 MCG (5000 UT) TABS Take 5,000 Units by mouth daily.    [provider]  cyanocobalamin 1000 MCG tablet Take 1 tablet by mouth every other day. Vitamin B12    [provider]  diltiazem (CARDIZEM) 30 MG tablet Take 1-2 tablets (30-60 mg total) by mouth 4 (four) times daily as needed (for heart rates greater than 100 bpm during atrial fibrillation). Patient not taking: Reported on 09/26/2022 06/30/21   Allred, Fayrene Fearing, MD  ELIQUIS 5 MG TABS tablet TAKE 1 TABLET BY MOUTH TWICE A DAY 07/16/22   Allred, Fayrene Fearing, MD  ferrous sulfate (FERROUSUL) 325 (65 FE) MG tablet Take 1 tablet (325 mg total) by mouth 3 (three) times daily with meals for 14 days. Patient taking differently: Take 325 mg by mouth 2 (two) times daily with a meal. 09/16/20 09/26/22  Lanney Gins, PA-C  furosemide (LASIX) 20 MG tablet Take 1 tablet (20 mg total) by mouth daily. 10/02/22 10/02/23  Cipriano Bunker, MD  gabapentin (NEURONTIN) 300 MG capsule Take 300 mg by mouth See admin instructions. Take one capsule (300 mg) in the morning and two capsules (600 mg) in the evening.    [provider]  ipratropium-albuterol (DUONEB) 0.5-2.5 (3) MG/3ML SOLN Take 3 mLs by nebulization every 6 (six) hours as needed. 07/07/22   [provider]  KLOR-CON M20 20 MEQ tablet Take 20 mEq by mouth daily with supper. 07/19/22   [provider]  labetalol (NORMODYNE) 200 MG tablet Take 200 mg by mouth 2 (two) times daily.    [provider]  methocarbamol (ROBAXIN) 750 MG tablet Take 750 mg by mouth 3 (three) times daily as needed. 03/06/22   [provider]  mirtazapine (REMERON) 15 MG tablet Take 15 mg by mouth at bedtime. 02/27/22   [provider]  Multiple Vitamin (MULTIVITAMIN WITH MINERALS)  TABS tablet Take 1 tablet by mouth daily.    [provider]  Multiple Vitamins-Minerals (PRESERVISION AREDS 2 PO) Take 1 tablet by mouth daily. [employer], Historical, MD  Omega-3 Fatty Acids (OMEGA-3 FISH OIL PO) Take 1 capsule by mouth daily.    [provider]  pantoprazole (PROTONIX) 40 MG tablet Take 40 mg by mouth daily. 05/13/21   [provider]  pravastatin (PRAVACHOL) 20 MG tablet Take 20 mg by mouth at bedtime.    [provider]  spironolactone (ALDACTONE) 25 MG tablet Take 25 mg by mouth daily.     [provider]  sucralfate (CARAFATE) 1 g tablet Take 1 g by mouth 4 (four) times daily -  with meals and at bedtime.    [provider]    Physical  Exam: Vitals:   11/24/22 2330 11/24/22 2345 11/25/22 0000 11/25/22 0015  BP: (!) 128/97  118/71   Pulse: 86 (!) 34 (!) 48 97  Resp: (!) 32 (!) 22 (!) 26 (!) 26  Temp:      TempSrc:      SpO2: 98% 92% 97% 96%  Weight:      Height:       Constitutional: Chronically ill-appearing woman resting in bed with head elevated.  Appears fatigued.  Speaking in full sentences. Eyes: EOMI, lids and conjunctivae normal ENMT: Mucous membranes are dry. Posterior pharynx clear of any exudate or lesions.Normal dentition.  Neck: normal, supple, no masses. Respiratory: Wet upper airway sounds, expiratory wheezing bilaterally.  Increased respiratory effort while on 2 L O2 via [place]. No accessory muscle use.  Cardiovascular: Irregularly irregular, no murmurs / rubs / gallops. No extremity edema.  Abdomen: no tenderness, no masses palpated.  Musculoskeletal: no clubbing / cyanosis. No joint deformity upper and lower extremities. Good ROM, no contractures.  Skin: no rashes, lesions, ulcers.  Neurologic: Sensation intact. Strength equal bilaterally. Psychiatric: Alert and oriented x 3.   EKG: Personally reviewed. Atrial fibrillation, rate 106.  Rate is faster when compared to  prior.  Assessment/Plan Principal Problem:   Acute respiratory failure with hypoxia (HCC) Active Problems:   Right upper lobe pneumonia   COPD with acute exacerbation (HCC)   Paroxysmal atrial fibrillation (HCC)   Influenza B   Hyponatremia   Essential hypertension   Hyperlipidemia   [name] is a 89 y.o. female with medical history significant for PAF on Eliquis, COPD not on supplemental O2 at home, TIA, HTN, HLD who is admitted with acute hypoxic respiratory failure in setting of influenza B infection associated with right upper lobe pneumonia and A-fib with RVR.  Assessment and Plan: * Acute respiratory failure with hypoxia (HCC) New hypoxia with SpO2 88% on room air.  Multifactorial due to right upper lobe pneumonia and COPD exacerbation.  Increased work of breathing on admission however SpO2 stable on 2 L O2 via [place]. -Continue antibiotics, steroids, nebulizers, and supplemental oxygen as above -BiPAP if needed  Right upper lobe pneumonia Worsening right upper lobe infiltrate noted on chest x-ray compared to previous from 1/2.  Concern is for secondary bacterial pneumonia failing outpatient antibiotics. -IV ceftriaxone and azithromycin -Follow blood cultures -Obtain CT chest to rule out mass -Strep pneumonia and Legionella antigens, sputum culture -Continue supplemental O2 as needed  COPD with acute exacerbation ([place]) Wheezing bilateral lung fields present on exam at time of admission.  Likely COPD exacerbation triggered in setting of influenza A and RUL pneumonia. -IV Solu-Medrol 40 mg twice daily -Start scheduled DuoNebs with albuterol as needed -Continue supplemental oxygen as needed  Influenza B Given severity of illness requiring hospitalization will go ahead and start on renal dosed Tamiflu.  Paroxysmal atrial fibrillation (HCC) Reportedly rapid A-fib at home with HR up to 150s.  Has been in 100-130s while in the ED and started on IV diltiazem infusion.  At risk  for increased heart rate with need for nebulizer treatment. -Continue IV diltiazem infusion for now -Continue Eliquis  Hyponatremia Sodium 124.  Difficult to assess volume status based on body habitus however suspect overall volume depleted in setting of influenza A and poor oral intake.  BNP 390.2 however no pulmonary edema seen on imaging. -S/p IV Lasix 40 mg in the ED -Hold further diuretics including home Lasix and spironolactone -Check urine studies and osmolality, repeat BMP in  a.m. -May need IV fluids based on clinical course  Hyperlipidemia Continue pravastatin.  Essential hypertension Holding home benazepril, amlodipine, labetalol while on diltiazem infusion.  Reintroduce as needed.  DVT prophylaxis:  apixaban (ELIQUIS) tablet 5 mg   Code Status: Full code, confirmed with patient on admission. Family Communication: Son and daughter at bedside Disposition Plan: From home, dispo pending clinical progress Consults called: None Severity of Illness: The appropriate patient status for this patient is INPATIENT. Inpatient status is judged to be reasonable and necessary in order to provide the required intensity of service to ensure the patient's safety. The patient's presenting symptoms, physical exam findings, and initial radiographic and laboratory data in the context of their chronic comorbidities is felt to place them at high risk for further clinical deterioration. Furthermore, it is not anticipated that the patient will be medically stable for discharge from the hospital within 2 midnights of admission.   * I certify that at the point of admission it is my clinical judgment that the patient will require inpatient hospital care spanning beyond 2 midnights from the point of admission due to high intensity of service, high risk for further deterioration and high frequency of surveillance required.Darreld Mclean MD [facility]  If 7PM-7AM, please contact  night-coverage [email or web address]  11/25/2022, 12:47 AM

## 2022-11-24 NOTE — ED Triage Notes (Addendum)
Pt was diagnosed with flu on Monday and since then has become dyspneic and appears to be back in afib per family.  Takes eliquis and metoprolol. No missed doses of eliquis.  89% in triage, not normally on home O2, improved to 95% with 2L in triage.  H/o COPD and afib Endorses productively yellow cough Denies fever Currently taking 2 unknown abx.

## 2022-11-24 NOTE — ED Provider Triage Note (Signed)
Emergency Medicine Provider Triage Evaluation Note  [name] , a 89 y.o. female  was evaluated in triage.  Pt complains of shortness of breath.  Patient was recently diagnosed with influenza B and has been dealing with this fairly well but recently had to have increasing shortness of breath.  She has a prior history of A-fib as well as COPD.  She was given a dose of DuoNeb prior to arrival here in the emergency department and given a dose of albuterol here in triage.  Patient is currently on Eliquis.  Review of Systems  Positive: As above Negative: As above  Physical Exam  BP (!) 132/93 (BP Location: Right Arm)   Pulse (!) 105   Temp 97.6 F (36.4 C) (Oral)   Resp (!) 30   Wt 87 kg   SpO2 95%   BMI 32.92 kg/m  Gen:   Awake, no distress   Resp:  Normal effort diffuse wheezing in all lung fields, some crackles heard in lung apices MSK:   Moves extremities without difficulty  Other:  Irregular rhythm  Medical Decision Making  Medically screening exam initiated at 9:31 PM.  Appropriate orders placed.  [name] was informed that the remainder of the evaluation will be completed by another provider, this initial triage assessment does not replace that evaluation, and the importance of remaining in the ED until their evaluation is complete.     Luvenia Heller, PA-C 11/24/22 2132

## 2022-11-25 ENCOUNTER — Inpatient Hospital Stay (HOSPITAL_COMMUNITY): Payer: Medicare Other

## 2022-11-25 DIAGNOSIS — J9601 Acute respiratory failure with hypoxia: Secondary | ICD-10-CM | POA: Diagnosis not present

## 2022-11-25 LAB — CBC
HCT: 32.3 % — ABNORMAL LOW (ref 36.0–46.0)
Hemoglobin: 10.6 g/dL — ABNORMAL LOW (ref 12.0–15.0)
MCH: 27.7 pg (ref 26.0–34.0)
MCHC: 32.8 g/dL (ref 30.0–36.0)
MCV: 84.3 fL (ref 80.0–100.0)
Platelets: 312 10*3/uL (ref 150–400)
RBC: 3.83 MIL/uL — ABNORMAL LOW (ref 3.87–5.11)
RDW: 14 % (ref 11.5–15.5)
WBC: 9.1 10*3/uL (ref 4.0–10.5)
nRBC: 0 % (ref 0.0–0.2)

## 2022-11-25 LAB — BASIC METABOLIC PANEL
Anion gap: 13 (ref 5–15)
BUN: 14 mg/dL (ref 8–23)
CO2: 19 mmol/L — ABNORMAL LOW (ref 22–32)
Calcium: 9.1 mg/dL (ref 8.9–10.3)
Chloride: 92 mmol/L — ABNORMAL LOW (ref 98–111)
Creatinine, Ser: 0.62 mg/dL (ref 0.44–1.00)
GFR, Estimated: >60 mL/min (ref >60)
Glucose, Bld: 138 mg/dL — ABNORMAL HIGH (ref 70–99)
Potassium: 4.9 mmol/L (ref 3.5–5.1)
Sodium: 124 mmol/L — ABNORMAL LOW (ref 135–145)

## 2022-11-25 LAB — RESP PANEL BY RT-PCR (RSV, FLU A&B, COVID)  RVPGX2
Influenza A by PCR: NEGATIVE
Influenza B by PCR: POSITIVE — AB
Resp Syncytial Virus by PCR: NEGATIVE
SARS Coronavirus 2 by RT PCR: NEGATIVE

## 2022-11-25 LAB — PROCALCITONIN: Procalcitonin: <0.1 ng/mL

## 2022-11-25 LAB — OSMOLALITY: Osmolality: 262 mOsm/kg — ABNORMAL LOW (ref 275–295)

## 2022-11-25 LAB — TSH: TSH: 0.666 u[IU]/mL (ref 0.350–4.500)

## 2022-11-25 LAB — MAGNESIUM: Magnesium: 1.3 mg/dL — ABNORMAL LOW (ref 1.7–2.4)

## 2022-11-25 LAB — POTASSIUM: Potassium: 4.9 mmol/L (ref 3.5–5.1)

## 2022-11-25 MED ORDER — METHYLPREDNISOLONE SODIUM SUCC 125 MG IJ SOLR
80.0000 mg | Freq: Two times a day (BID) | INTRAMUSCULAR | Status: DC
  Administered 2022-11-25 – 2022-11-26 (×4): 80 mg via INTRAVENOUS
  Filled 2022-11-25 (×4): qty 2

## 2022-11-25 MED ORDER — GABAPENTIN 300 MG PO CAPS
600.0000 mg | ORAL_CAPSULE | Freq: Every day | ORAL | Status: DC
  Administered 2022-11-25 – 2022-12-03 (×9): 600 mg via ORAL
  Filled 2022-11-25 (×9): qty 2

## 2022-11-25 MED ORDER — GABAPENTIN 300 MG PO CAPS
300.0000 mg | ORAL_CAPSULE | Freq: Every day | ORAL | Status: DC
  Administered 2022-11-25 – 2022-12-04 (×10): 300 mg via ORAL
  Filled 2022-11-25 (×10): qty 1

## 2022-11-25 MED ORDER — LEVALBUTEROL HCL 0.63 MG/3ML IN NEBU
0.6300 mg | INHALATION_SOLUTION | Freq: Four times a day (QID) | RESPIRATORY_TRACT | Status: DC
  Administered 2022-11-25 – 2022-11-26 (×5): 0.63 mg via RESPIRATORY_TRACT
  Filled 2022-11-25 (×5): qty 3

## 2022-11-25 MED ORDER — LABETALOL HCL 200 MG PO TABS
200.0000 mg | ORAL_TABLET | Freq: Two times a day (BID) | ORAL | Status: DC
  Administered 2022-11-25 – 2022-12-04 (×19): 200 mg via ORAL
  Filled 2022-11-25 (×19): qty 1

## 2022-11-25 MED ORDER — GABAPENTIN 300 MG PO CAPS: 300.0000 mg | ORAL_CAPSULE | ORAL | Status: DC

## 2022-11-25 MED ORDER — DILTIAZEM HCL 30 MG PO TABS
30.0000 mg | ORAL_TABLET | Freq: Four times a day (QID) | ORAL | Status: DC
  Administered 2022-11-25 – 2022-11-27 (×9): 30 mg via ORAL
  Filled 2022-11-25 (×9): qty 1

## 2022-11-25 MED ORDER — BUDESONIDE 0.5 MG/2ML IN SUSP
0.5000 mg | Freq: Three times a day (TID) | RESPIRATORY_TRACT | Status: DC
  Administered 2022-11-25 – 2022-11-26 (×5): 0.5 mg via RESPIRATORY_TRACT
  Filled 2022-11-25 (×5): qty 2

## 2022-11-25 MED ORDER — IPRATROPIUM-ALBUTEROL 0.5-2.5 (3) MG/3ML IN SOLN
RESPIRATORY_TRACT | Status: AC
  Filled 2022-11-25: qty 3

## 2022-11-25 MED ORDER — PRAVASTATIN SODIUM 20 MG PO TABS
20.0000 mg | ORAL_TABLET | Freq: Every day | ORAL | Status: DC
  Administered 2022-11-25 – 2022-12-03 (×9): 20 mg via ORAL
  Filled 2022-11-25 (×9): qty 1

## 2022-11-25 MED ORDER — MIRTAZAPINE 15 MG PO TBDP
15.0000 mg | ORAL_TABLET | Freq: Once | ORAL | Status: AC
  Administered 2022-11-25: 15 mg via ORAL
  Filled 2022-11-25: qty 1

## 2022-11-25 MED ORDER — PANTOPRAZOLE SODIUM 40 MG PO TBEC
40.0000 mg | DELAYED_RELEASE_TABLET | Freq: Every day | ORAL | Status: DC
  Administered 2022-11-25 – 2022-12-04 (×10): 40 mg via ORAL
  Filled 2022-11-25 (×10): qty 1

## 2022-11-25 MED ORDER — GUAIFENESIN-DM 100-10 MG/5ML PO SYRP
5.0000 mL | ORAL_SOLUTION | ORAL | Status: DC | PRN
  Administered 2022-11-25 – 2022-11-26 (×2): 5 mL via ORAL
  Filled 2022-11-25 (×2): qty 10

## 2022-11-25 MED ORDER — BUDESONIDE 0.5 MG/2ML IN SUSP: 0.5000 mg | Freq: Three times a day (TID) | RESPIRATORY_TRACT | Status: DC

## 2022-11-25 MED ORDER — APIXABAN 5 MG PO TABS
5.0000 mg | ORAL_TABLET | Freq: Two times a day (BID) | ORAL | Status: DC
  Administered 2022-11-25 – 2022-12-04 (×19): 5 mg via ORAL
  Filled 2022-11-25 (×19): qty 1

## 2022-11-25 MED ORDER — MAGNESIUM SULFATE 2 GM/50ML IV SOLN
2.0000 g | Freq: Once | INTRAVENOUS | Status: AC
  Administered 2022-11-25: 2 g via INTRAVENOUS
  Filled 2022-11-25: qty 50

## 2022-11-25 MED ORDER — OSELTAMIVIR PHOSPHATE 30 MG PO CAPS
30.0000 mg | ORAL_CAPSULE | Freq: Two times a day (BID) | ORAL | Status: AC
  Administered 2022-11-25 – 2022-11-29 (×9): 30 mg via ORAL
  Filled 2022-11-25 (×9): qty 1

## 2022-11-25 NOTE — Assessment & Plan Note (Signed)
Continue pravastatin 

## 2022-11-25 NOTE — Assessment & Plan Note (Addendum)
Sodium 124.  Difficult to assess volume status based on body habitus however suspect overall volume depleted in setting of influenza A and poor oral intake.  BNP 390.2 however no pulmonary edema seen on imaging. -S/p IV Lasix 40 mg in the ED -Hold further diuretics including home Lasix and spironolactone -Check urine studies and osmolality, repeat BMP in a.m. -May need IV fluids based on clinical course

## 2022-11-25 NOTE — Assessment & Plan Note (Signed)
Wheezing bilateral lung fields present on exam at time of admission.  Likely COPD exacerbation triggered in setting of influenza A and RUL pneumonia. -IV Solu-Medrol 40 mg twice daily -Start scheduled DuoNebs with albuterol as needed -Continue supplemental oxygen as needed

## 2022-11-25 NOTE — ED Notes (Signed)
ED TO INPATIENT HANDOFF REPORT  ED Nurse Name and Phone #: Mady Gemma Name/Age/Gender [name] 89 y.o. female Room/Bed: WA09/WA09  Code Status   Code Status: Full Code  Home/SNF/Other Home Patient oriented to: self, place, time, and situation Is this baseline? Yes   Triage Complete: Triage complete  Chief Complaint Acute respiratory failure with hypoxia [facility]) [J96.01]  Triage Note Pt was diagnosed with flu on Monday and since then has become dyspneic and appears to be back in afib per family.  Takes eliquis and metoprolol. No missed doses of eliquis.  89% in triage, not normally on home O2, improved to 95% with 2L in triage.  H/o COPD and afib Endorses productively yellow cough Denies fever Currently taking 2 unknown abx.    Allergies Allergies  Allergen Reactions   Keflex [Cephalexin] Other (See Comments)    "makes pt feel really bad"    Level of Care/Admitting Diagnosis ED Disposition     ED Disposition  Admit   Condition  --   Comment  Hospital Area: [facility] [[id number]]  Level of Care: Progressive [102]  Admit to Progressive based on following criteria: RESPIRATORY PROBLEMS hypoxemic/hypercapnic respiratory failure that is responsive to NIPPV (BiPAP) or High Flow Nasal Cannula (6-80 lpm). Frequent assessment/intervention, no > Q2 hrs < Q4 hrs, to maintain oxygenation and pulmonary hygiene.  Admit to Progressive based on following criteria: CARDIOVASCULAR & THORACIC of moderate stability with acute coronary syndrome symptoms/low risk myocardial infarction/hypertensive urgency/arrhythmias/heart failure potentially compromising stability and stable post cardiovascular intervention patients.  May admit patient to [facility] or [facility] if equivalent level of care is available:: No  Covid Evaluation: Confirmed COVID Negative  Diagnosis: Acute respiratory failure with hypoxia [facility]) [[id number]]  Admitting Physician: Charlsie Quest  [[id number]]  Attending Physician: Charlsie Quest [[id number]]  Certification:: I certify this patient will need inpatient services for at least 2 midnights          B Medical/Surgery History Past Medical History:  Diagnosis Date   Arthritis    Atrial fibrillation, transient (HCC)    post op hip surgery; May 2021   Diverticulitis    Dyspnea    with exertion    GERD (gastroesophageal reflux disease)    Hyperlipidemia    Hypertension    Stroke (HCC)    hx opf mini strokes    TIA (transient ischemic attack)    Past Surgical History:  Procedure Laterality Date   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/13/2020   Procedure: CONVERSION TO LEFT TOTAL HIP-POSTERIOR APPROACH;  Surgeon: Durene Romans, MD;  Location: [facility];  Service: Orthopedics;  Laterality: Left;  90 mins   HIP FRACTURE SURGERY Left 04/16/2020   KNEE SURGERY     WRIST SURGERY       A IV Location/Drains/Wounds Patient Lines/Drains/Airways Status     Active Line/Drains/Airways     Name Placement date Placement time Site Days   Peripheral IV 11/24/22 20 G Left Antecubital 11/24/22  2124  Antecubital  1   Peripheral IV 11/25/22 20 G Right Antecubital 11/25/22  0029  Antecubital  less than 1   External Urinary Catheter --  --  --  --   Incision (Closed) 09/13/20 Hip Left 09/13/20  1035  -- 803            Intake/Output Last 24 hours  Intake/Output Summary (Last 24 hours) at 11/25/2022 0044 Last data filed at 11/25/2022 0041 Gross per 24 hour  Intake 146.01  ml  Output --  Net 146.01 ml    Labs/Imaging Results for orders placed or performed during the hospital encounter of 11/24/22 (from the past 48 hour(s))  Basic metabolic panel     Status: Abnormal   Collection Time: 11/24/22  9:17 PM  Result Value Ref Range   Sodium 124 (L) 135 - 145 mmol/L   Potassium 5.5 (H) 3.5 - 5.1 mmol/L    Comment: HEMOLYSIS AT THIS LEVEL MAY AFFECT RESULT   Chloride 93 (L) 98 - 111 mmol/L   CO2 22 22 - 32 mmol/L   Glucose, Bld 132  (H) 70 - 99 mg/dL    Comment: Glucose reference range applies only to samples taken after fasting for at least 8 hours.   BUN 16 8 - 23 mg/dL   Creatinine, Ser 6.43 0.44 - 1.00 mg/dL   Calcium 9.2 8.9 - 32.9 mg/dL   GFR, Estimated >51 >88 mL/min    Comment: (NOTE) Calculated using the CKD-EPI Creatinine Equation (2021)    Anion gap 9 5 - 15    Comment: Performed at [facility], [address], [place], [place] [address]  CBC     Status: Abnormal   Collection Time: 11/24/22  9:17 PM  Result Value Ref Range   WBC 11.1 (H) 4.0 - 10.5 K/uL   RBC 3.87 3.87 - 5.11 MIL/uL   Hemoglobin 10.8 (L) 12.0 - 15.0 g/dL   HCT 63.0 (L) 16.0 - 10.9 %   MCV 85.8 80.0 - 100.0 fL   MCH 27.9 26.0 - 34.0 pg   MCHC 32.5 30.0 - 36.0 g/dL   RDW 32.3 55.7 - 32.2 %   Platelets 313 150 - 400 K/uL   nRBC 0.0 0.0 - 0.2 %    Comment: Performed at [facility], [address] [place], [place] [address]  Brain natriuretic peptide     Status: Abnormal   Collection Time: 11/24/22  9:17 PM  Result Value Ref Range   B Natriuretic Peptide 390.2 (H) 0.0 - 100.0 pg/mL    Comment: Performed at [facility], [address], [place], [place] [address]  Resp panel by RT-PCR (RSV, Flu A&B, Covid) Anterior Nasal Swab     Status: Abnormal   Collection Time: 11/24/22 11:08 PM   Specimen: Anterior Nasal Swab  Result Value Ref Range   SARS Coronavirus 2 by RT PCR NEGATIVE NEGATIVE    Comment: (NOTE) SARS-CoV-2 target nucleic acids are NOT DETECTED.  The SARS-CoV-2 RNA is generally detectable in upper respiratory specimens during the acute phase of infection. The lowest concentration of SARS-CoV-2 viral copies this assay can detect is 138 copies/mL. A negative result does not preclude SARS-Cov-2 infection and should not be used as the sole basis for treatment or other patient management decisions. A negative result may occur with  improper specimen  collection/handling, submission of specimen other than nasopharyngeal swab, presence of viral mutation(s) within the areas targeted by this assay, and inadequate number of viral copies(<138 copies/mL). A negative result must be combined with clinical observations, patient history, and epidemiological information. The expected result is Negative.  Fact Sheet for Patients:  BloggerCourse.com  Fact Sheet for Healthcare Providers:  SeriousBroker.it  This test is no t yet approved or cleared by the [place] FDA and  has been authorized for detection and/or diagnosis of SARS-CoV-2 by FDA under an Emergency Use Authorization (EUA). This EUA will remain  in effect (meaning this test can be used)  for the duration of the COVID-19 declaration under Section 564(b)(1) of the Act, 21 U.S.C.section 360bbb-3(b)(1), unless the authorization is terminated  or revoked sooner.       Influenza A by PCR NEGATIVE NEGATIVE   Influenza B by PCR POSITIVE (A) NEGATIVE    Comment: (NOTE) The Xpert Xpress SARS-CoV-2/FLU/RSV plus assay is intended as an aid in the diagnosis of influenza from Nasopharyngeal swab specimens and should not be used as a sole basis for treatment. Nasal washings and aspirates are unacceptable for Xpert Xpress SARS-CoV-2/FLU/RSV testing.  Fact Sheet for Patients: BloggerCourse.com  Fact Sheet for Healthcare Providers: SeriousBroker.it  This test is not yet approved or cleared by the [place] FDA and has been authorized for detection and/or diagnosis of SARS-CoV-2 by FDA under an Emergency Use Authorization (EUA). This EUA will remain in effect (meaning this test can be used) for the duration of the COVID-19 declaration under Section 564(b)(1) of the Act, 21 U.S.C. section 360bbb-3(b)(1), unless the authorization is terminated or revoked.     Resp Syncytial Virus by  PCR NEGATIVE NEGATIVE    Comment: (NOTE) Fact Sheet for Patients: BloggerCourse.com  Fact Sheet for Healthcare Providers: SeriousBroker.it  This test is not yet approved or cleared by the [place] FDA and has been authorized for detection and/or diagnosis of SARS-CoV-2 by FDA under an Emergency Use Authorization (EUA). This EUA will remain in effect (meaning this test can be used) for the duration of the COVID-19 declaration under Section 564(b)(1) of the Act, 21 U.S.C. section 360bbb-3(b)(1), unless the authorization is terminated or revoked.  Performed at [facility], [address], [place], [place] [address]    DG Chest 2 View  Result Date: 11/24/2022 CLINICAL DATA:  Shortness of breath. EXAM: CHEST - 2 VIEW COMPARISON:  November 20, 2022 FINDINGS: The heart size and mediastinal contours are within normal limits. The lungs are hyperinflated with mild right-sided volume loss noted. Chronic appearing increased interstitial lung markings are seen. Moderate severity right upper lobe infiltrate is also present. This is increased in severity when compared to the prior study. Mild areas of atelectasis and/or infiltrate are also seen within the bilateral lung bases. There is a small right pleural effusion. No pneumothorax is identified. The visualized skeletal structures are unremarkable. IMPRESSION: 1. COPD with moderate severity right upper lobe infiltrate. 2. Mild bibasilar atelectasis and/or infiltrate. 3. Small right pleural effusion. Electronically Signed   By: Aram Candela M.D.   On: 11/24/2022 22:46    Pending Labs Unresulted Labs (From admission, onward)     Start     Ordered   11/25/22 0500  CBC  Tomorrow morning,   R        11/24/22 2349   11/25/22 0500  Basic metabolic panel  Tomorrow morning,   R        11/24/22 2349   11/24/22 2346  Strep pneumoniae urinary antigen  (COPD / Pneumonia / Cellulitis /  Lower Extremity Wound)  Once,   R        11/24/22 2349   11/24/22 2346  Legionella Pneumophila Serogp 1 Ur Ag  (COPD / Pneumonia / Cellulitis / Lower Extremity Wound)  Once,   R        11/24/22 2349   11/24/22 2346  Expectorated Sputum Assessment w Gram Stain, Rflx to Resp Cult  (COPD / Pneumonia / Cellulitis / Lower Extremity Wound)  Once,   R        11/24/22 2349  11/24/22 2345  Magnesium  Once,   R        11/24/22 2344   11/24/22 2324  Procalcitonin - Baseline  Once,   R        11/24/22 2323   11/24/22 2323  Osmolality  Once,   R        11/24/22 2323   11/24/22 2323  Osmolality, urine  Once,   R        11/24/22 2323   11/24/22 2323  Sodium, urine, random  Once,   R        11/24/22 2323   11/24/22 2323  Urinalysis, Routine w reflex microscopic  Once,   R        11/24/22 2323   11/24/22 2323  Potassium  Once,   R        11/24/22 2323            Vitals/Pain Today's Vitals   11/24/22 2330 11/24/22 2345 11/25/22 0000 11/25/22 0015  BP: (!) 128/97  118/71   Pulse: 86 (!) 34 (!) 48 97  Resp: (!) 32 (!) 22 (!) 26 (!) 26  Temp:      TempSrc:      SpO2: 98% 92% 97% 96%  Weight:      Height:      PainSc:        Isolation Precautions Airborne and Contact precautions  Medications Medications  diltiazem (CARDIZEM) 1 mg/mL load via infusion 10 mg (10 mg Intravenous Bolus from Bag 11/24/22 2301)    And  diltiazem (CARDIZEM) 125 mg in dextrose 5% 125 mL (1 mg/mL) infusion (5 mg/hr Intravenous New Bag/Given 11/24/22 2257)  cefTRIAXone (ROCEPHIN) 2 g in sodium chloride 0.9 % 100 mL IVPB (has no administration in time range)  azithromycin (ZITHROMAX) 500 mg in sodium chloride 0.9 % 250 mL IVPB (has no administration in time range)  ipratropium-albuterol (DUONEB) 0.5-2.5 (3) MG/3ML nebulizer solution 3 mL (has no administration in time range)  albuterol (PROVENTIL) (2.5 MG/3ML) 0.083% nebulizer solution 2.5 mg (has no administration in time range)  methylPREDNISolone sodium succinate  (SOLU-MEDROL) 40 mg/mL injection 40 mg (40 mg Intravenous Given 11/25/22 0040)  sodium chloride flush (NS) 0.9 % injection 3 mL (3 mLs Intravenous Given 11/25/22 0031)  acetaminophen (TYLENOL) tablet 650 mg (has no administration in time range)    Or  acetaminophen (TYLENOL) suppository 650 mg (has no administration in time range)  ondansetron (ZOFRAN) tablet 4 mg (has no administration in time range)    Or  ondansetron (ZOFRAN) injection 4 mg (has no administration in time range)  senna-docusate (Senokot-S) tablet 1 tablet (has no administration in time range)  guaiFENesin (MUCINEX) 12 hr tablet 600 mg (has no administration in time range)  oseltamivir (TAMIFLU) capsule 75 mg (has no administration in time range)  ipratropium-albuterol (DUONEB) 0.5-2.5 (3) MG/3ML nebulizer solution (has no administration in time range)  oseltamivir (TAMIFLU) capsule 30 mg (has no administration in time range)  apixaban (ELIQUIS) tablet 5 mg (has no administration in time range)  gabapentin (NEURONTIN) capsule 300 mg (has no administration in time range)  pantoprazole (PROTONIX) EC tablet 40 mg (has no administration in time range)  pravastatin (PRAVACHOL) tablet 20 mg (has no administration in time range)  furosemide (LASIX) injection 40 mg (40 mg Intravenous Given 11/24/22 2258)  aerochamber Z-Stat Plus/medium (  Given 11/24/22 2301)    Mobility walks with device High fall risk   Focused Assessments    R Recommendations: See Admitting  Provider Note  Report given to:   Additional Notes: pt was placed on BIPAP.

## 2022-11-25 NOTE — Progress Notes (Signed)
Patient's heart rate in the 70s to 80s, and soft BP. Dr. Manuella Ghazi notified, okay to stop the IV Cardizem and continue the PO Cardizem, and placed parameters on labetalol. Will continue to assess patient. Still on 4L of oxygen, oxygen saturation in the high 90s. Very SOB with activity

## 2022-11-25 NOTE — Assessment & Plan Note (Signed)
Worsening right upper lobe infiltrate noted on chest x-ray compared to previous from 1/2.  Concern is for secondary bacterial pneumonia failing outpatient antibiotics. -IV ceftriaxone and azithromycin -Follow blood cultures -Obtain CT chest to rule out mass -Strep pneumonia and Legionella antigens, sputum culture -Continue supplemental O2 as needed

## 2022-11-25 NOTE — Assessment & Plan Note (Signed)
Reportedly rapid A-fib at home with HR up to 150s.  Has been in 100-130s while in the ED and started on IV diltiazem infusion.  At risk for increased heart rate with need for nebulizer treatment. -Continue IV diltiazem infusion for now -Continue Eliquis

## 2022-11-25 NOTE — TOC Progression Note (Signed)
Transition of Care [facility]) - Progression Note    Patient Details  Name: [name] MRN: [id number] Date of Birth: 07-05-1933  Transition of Care [facility]) CM/SW Contact  Servando Snare, [place] Phone Number: 11/25/2022, 12:08 PM  Clinical Narrative:    Transition of Care (TOC) Screening Note   Patient Details  Name: [name] Date of Birth: September 01, 1933   Transition of Care [facility]) CM/SW Contact:    Servando Snare, LCSW Phone Number: 11/25/2022, 12:08 PM    Transition of Care Department [facility]) has reviewed patient and no TOC needs have been identified at this time. We will continue to monitor patient advancement through interdisciplinary progression rounds. If new patient transition needs arise, please place a TOC consult.           Expected Discharge Plan and Services                                               Social Determinants of Health (SDOH) Interventions SDOH Screenings   Food Insecurity: No Food Insecurity (11/25/2022)  Housing: Low Risk  (11/25/2022)  Transportation Needs: No Transportation Needs (11/25/2022)  Utilities: Not At Risk (11/25/2022)  Tobacco Use: Low Risk  (11/24/2022)    Readmission Risk Interventions     No data to display

## 2022-11-25 NOTE — Hospital Course (Signed)
[name] is a 89 y.o. female with medical history significant for PAF on Eliquis, COPD not on supplemental O2 at home, TIA, HTN, HLD who is admitted with acute hypoxic respiratory failure in setting of influenza B infection associated with right upper lobe pneumonia and A-fib with RVR.

## 2022-11-25 NOTE — Assessment & Plan Note (Signed)
Holding home benazepril, amlodipine, labetalol while on diltiazem infusion.  Reintroduce as needed.

## 2022-11-25 NOTE — Progress Notes (Signed)
PROGRESS NOTE    [name]  LPF:[id number] DOB: 07/01/1933 DOA: 11/24/2022 PCP: Hadley Pen, MD   Brief Narrative:  [name] is a 89 y.o. female with medical history significant for PAF on Eliquis, COPD not on supplemental O2 at home, TIA, HTN, HLD who is admitted with acute hypoxic respiratory failure in setting of influenza B infection associated with right upper lobe pneumonia and A-fib with RVR.    Assessment & Plan:   Principal Problem:   Acute respiratory failure with hypoxia (HCC) Active Problems:   Right upper lobe pneumonia   COPD with acute exacerbation (HCC)   Paroxysmal atrial fibrillation (HCC)   Influenza B   Hyponatremia   Essential hypertension   Hyperlipidemia  Assessment and Plan:   Acute respiratory failure with hypoxia (HCC) New hypoxia with SpO2 88% on room air.  Multifactorial due to right upper lobe pneumonia and COPD exacerbation.  Increased work of breathing on admission however SpO2 stable on 2 L O2 via [place]. -Continue antibiotics, steroids, nebulizers, and supplemental oxygen as above -IV Solu-Medrol increased to 80 twice daily -Add Pulmicort 3 times daily -Nebulizers changed to Xopenex -Add flutter valve   Right upper lobe pneumonia Worsening right upper lobe infiltrate noted on chest x-ray compared to previous from 1/2.  Concern is for secondary bacterial pneumonia failing outpatient antibiotics. -IV ceftriaxone and azithromycin -Follow blood cultures -Obtain CT chest to rule out mass-pending -Strep pneumonia and Legionella antigens, sputum culture -Continue supplemental O2 as needed and wean   COPD with acute exacerbation (HCC) Wheezing bilateral lung fields present on exam at time of admission.  Likely COPD exacerbation triggered in setting of influenza A and RUL pneumonia. -IV Solu-Medrol 80 mg twice daily and added Pulmicort as above -Change DuoNebs to Xopenex due to elevated heart rate -Continue supplemental oxygen as  needed   Influenza B Given severity of illness requiring hospitalization will go ahead and start on renal dosed Tamiflu.  Hypomagnesemia Replete and reevaluate in a.m.   Paroxysmal atrial fibrillation (HCC) Reportedly rapid A-fib at home with HR up to 150s.  Has been in 100-130s while in the ED and started on IV diltiazem infusion.  At risk for increased heart rate with need for nebulizer treatment. -Continue IV diltiazem infusion for now and wean as tolerated -Resume home labetalol and start Cardizem 30 mg every 6 hours to help wean -Continue Eliquis   Hyponatremia Sodium 124.  Difficult to assess volume status based on body habitus however suspect overall volume depleted in setting of influenza A and poor oral intake.  BNP 390.2 however no pulmonary edema seen on imaging. -S/p IV Lasix 40 mg in the ED -Hold further diuretics including home Lasix and spironolactone -Check urine studies and osmolality, repeat BMP in a.m. -TSH 0.666 -May need IV fluids based on clinical course   Hyperlipidemia Continue pravastatin.   Essential hypertension Holding home benazepril, amlodipine for now, resumed diltiazem and labetalol to assist with heart rate, monitor closely  Obesity BMI 32.92    DVT prophylaxis:Eliquis Code Status: Full Family Communication: Daughter at bedside 1/7 Disposition Plan:  Status is: Inpatient Remains inpatient appropriate because: Need for IV medications.  Consultants:  None  Procedures:  None  Antimicrobials:  Anti-infectives (From admission, onward)    Start     Dose/Rate Route Frequency Ordered Stop   11/25/22 2300  azithromycin (ZITHROMAX) 500 mg in sodium chloride 0.9 % 250 mL IVPB        500 mg 250 mL/hr over 60  Minutes Intravenous Every 24 hours 11/24/22 2349 11/30/22 2259   11/25/22 2200  cefTRIAXone (ROCEPHIN) 2 g in sodium chloride 0.9 % 100 mL IVPB        2 g 200 mL/hr over 30 Minutes Intravenous Every 24 hours 11/24/22 2349 11/30/22 2159    11/25/22 1000  oseltamivir (TAMIFLU) capsule 30 mg        30 mg Oral 2 times daily 11/25/22 0024 11/29/22 2159   11/25/22 0030  oseltamivir (TAMIFLU) capsule 75 mg        75 mg Oral NOW 11/24/22 2349 11/25/22 0158   11/24/22 2245  levofloxacin (LEVAQUIN) IVPB 750 mg  Status:  Discontinued        750 mg 100 mL/hr over 90 Minutes Intravenous Every 24 hours 11/24/22 2231 11/24/22 2349       Subjective: Patient seen and evaluated today with ongoing cough with congestion and wheezing.  Continues to have elevated heart rates this morning and does not feel very well.  Objective: Vitals:   11/25/22 0638 11/25/22 0700 11/25/22 0751 11/25/22 0900  BP: 137/89 129/73  (!) 135/93  Pulse:    (!) 138  Resp:  (!) 22  (!) 24  Temp:    97.7 F (36.5 C)  TempSrc:    Oral  SpO2:  99% 97% 97%  Weight:      Height:        Intake/Output Summary (Last 24 hours) at 11/25/2022 0945 Last data filed at 11/25/2022 0900 Gross per 24 hour  Intake 199.12 ml  Output 1250 ml  Net -1050.88 ml   Filed Weights   11/24/22 2113 11/24/22 2317 11/25/22 0500  Weight: 87 kg 87 kg 90.6 kg    Examination:  General exam: Appears minimally anxious. Respiratory system: Diffuse wheezing bilaterally. Respiratory effort normal.  4 L nasal cannula Cardiovascular system: S1 & S2 heard, irregular and tachycardic.  Gastrointestinal system: Abdomen is soft Central nervous system: Alert and awake Extremities: No edema Skin: No significant lesions noted Psychiatry: Flat affect.    Data Reviewed: I have personally reviewed following labs and imaging studies  CBC: Recent Labs  Lab 11/24/22 2117 11/25/22 0542  WBC 11.1* 9.1  HGB 10.8* 10.6*  HCT 33.2* 32.3*  MCV 85.8 84.3  PLT 313 312   Basic Metabolic Panel: Recent Labs  Lab 11/24/22 2117 11/25/22 0548  NA 124* 124*  K 5.5* 4.9  4.9  CL 93* 92*  CO2 22 19*  GLUCOSE 132* 138*  BUN 16 14  CREATININE 0.75 0.62  CALCIUM 9.2 9.1  MG  --  1.3*    GFR: Estimated Creatinine Clearance: 54 mL/min (by C-G formula based on SCr of 0.62 mg/dL). Liver Function Tests: No results for input(s): "AST", "ALT", "ALKPHOS", "BILITOT", "PROT", "ALBUMIN" in the last 168 hours. No results for input(s): "LIPASE", "AMYLASE" in the last 168 hours. No results for input(s): "AMMONIA" in the last 168 hours. Coagulation Profile: No results for input(s): "INR", "PROTIME" in the last 168 hours. Cardiac Enzymes: No results for input(s): "CKTOTAL", "CKMB", "CKMBINDEX", "TROPONINI" in the last 168 hours. BNP (last 3 results) No results for input(s): "PROBNP" in the last 8760 hours. HbA1C: No results for input(s): "HGBA1C" in the last 72 hours. CBG: No results for input(s): "GLUCAP" in the last 168 hours. Lipid Profile: No results for input(s): "CHOL", "HDL", "LDLCALC", "TRIG", "CHOLHDL", "LDLDIRECT" in the last 72 hours. Thyroid Function Tests: Recent Labs    11/25/22 0548  TSH 0.666   Anemia Panel: No  results for input(s): "VITAMINB12", "FOLATE", "FERRITIN", "TIBC", "IRON", "RETICCTPCT" in the last 72 hours. Sepsis Labs: Recent Labs  Lab 11/25/22 0548  PROCALCITON <0.10    Recent Results (from the past 240 hour(s))  Resp panel by RT-PCR (RSV, Flu A&B, Covid) Anterior Nasal Swab     Status: Abnormal   Collection Time: 11/24/22 11:08 PM   Specimen: Anterior Nasal Swab  Result Value Ref Range Status   SARS Coronavirus 2 by RT PCR NEGATIVE NEGATIVE Final    Comment: (NOTE) SARS-CoV-2 target nucleic acids are NOT DETECTED.  The SARS-CoV-2 RNA is generally detectable in upper respiratory specimens during the acute phase of infection. The lowest concentration of SARS-CoV-2 viral copies this assay can detect is 138 copies/mL. A negative result does not preclude SARS-Cov-2 infection and should not be used as the sole basis for treatment or other patient management decisions. A negative result may occur with  improper specimen collection/handling,  submission of specimen other than nasopharyngeal swab, presence of viral mutation(s) within the areas targeted by this assay, and inadequate number of viral copies(<138 copies/mL). A negative result must be combined with clinical observations, patient history, and epidemiological information. The expected result is Negative.  Fact Sheet for Patients:  BloggerCourse.com  Fact Sheet for Healthcare Providers:  SeriousBroker.it  This test is no t yet approved or cleared by the [place] FDA and  has been authorized for detection and/or diagnosis of SARS-CoV-2 by FDA under an Emergency Use Authorization (EUA). This EUA will remain  in effect (meaning this test can be used) for the duration of the COVID-19 declaration under Section 564(b)(1) of the Act, 21 U.S.C.section 360bbb-3(b)(1), unless the authorization is terminated  or revoked sooner.       Influenza A by PCR NEGATIVE NEGATIVE Final   Influenza B by PCR POSITIVE (A) NEGATIVE Final    Comment: (NOTE) The Xpert Xpress SARS-CoV-2/FLU/RSV plus assay is intended as an aid in the diagnosis of influenza from Nasopharyngeal swab specimens and should not be used as a sole basis for treatment. Nasal washings and aspirates are unacceptable for Xpert Xpress SARS-CoV-2/FLU/RSV testing.  Fact Sheet for Patients: BloggerCourse.com  Fact Sheet for Healthcare Providers: SeriousBroker.it  This test is not yet approved or cleared by the [place] FDA and has been authorized for detection and/or diagnosis of SARS-CoV-2 by FDA under an Emergency Use Authorization (EUA). This EUA will remain in effect (meaning this test can be used) for the duration of the COVID-19 declaration under Section 564(b)(1) of the Act, 21 U.S.C. section 360bbb-3(b)(1), unless the authorization is terminated or revoked.     Resp Syncytial Virus by PCR  NEGATIVE NEGATIVE Final    Comment: (NOTE) Fact Sheet for Patients: BloggerCourse.com  Fact Sheet for Healthcare Providers: SeriousBroker.it  This test is not yet approved or cleared by the [place] FDA and has been authorized for detection and/or diagnosis of SARS-CoV-2 by FDA under an Emergency Use Authorization (EUA). This EUA will remain in effect (meaning this test can be used) for the duration of the COVID-19 declaration under Section 564(b)(1) of the Act, 21 U.S.C. section 360bbb-3(b)(1), unless the authorization is terminated or revoked.  Performed at [facility], [address], [place], [place] [address]          Radiology Studies: DG Chest 2 View  Result Date: 11/24/2022 CLINICAL DATA:  Shortness of breath. EXAM: CHEST - 2 VIEW COMPARISON:  November 20, 2022 FINDINGS: The heart size and mediastinal contours are within normal  limits. The lungs are hyperinflated with mild right-sided volume loss noted. Chronic appearing increased interstitial lung markings are seen. Moderate severity right upper lobe infiltrate is also present. This is increased in severity when compared to the prior study. Mild areas of atelectasis and/or infiltrate are also seen within the bilateral lung bases. There is a small right pleural effusion. No pneumothorax is identified. The visualized skeletal structures are unremarkable. IMPRESSION: 1. COPD with moderate severity right upper lobe infiltrate. 2. Mild bibasilar atelectasis and/or infiltrate. 3. Small right pleural effusion. Electronically Signed   By: Virgina Norfolk M.D.   On: 11/24/2022 22:46        Scheduled Meds:  apixaban  5 mg Oral BID   budesonide (PULMICORT) nebulizer solution  0.5 mg Nebulization TID   diltiazem  30 mg Oral Q6H   gabapentin  300 mg Oral Daily   And   gabapentin  600 mg Oral QHS   guaiFENesin  600 mg Oral BID   ipratropium-albuterol        labetalol  200 mg Oral BID   levalbuterol  0.63 mg Nebulization Q6H   methylPREDNISolone (SOLU-MEDROL) injection  80 mg Intravenous Q12H   oseltamivir  30 mg Oral BID   pantoprazole  40 mg Oral Daily   pravastatin  20 mg Oral QHS   sodium chloride flush  3 mL Intravenous Q12H   Continuous Infusions:  azithromycin     cefTRIAXone (ROCEPHIN)  IV     diltiazem (CARDIZEM) infusion 12.5 mg/hr (11/25/22 0702)   magnesium sulfate bolus IVPB       LOS: 1 day    Time spent: 35 minutes    Shelbi Vaccaro Darleen Crocker, DO [facility]  If 7PM-7AM, please contact night-coverage [email or web address] 11/25/2022, 9:45 AM

## 2022-11-25 NOTE — Progress Notes (Signed)
PHARMACY NOTE:  ANTIMICROBIAL RENAL DOSAGE ADJUSTMENT  Current antimicrobial regimen includes a mismatch between antimicrobial dosage and estimated renal function.  As per policy approved by the Pharmacy & Therapeutics and Medical Executive Committees, the antimicrobial dosage will be adjusted accordingly.  Current antimicrobial dosage:  Tamiflu 75mg  PO BID  Indication: +Influenza B  Renal Function:  Estimated Creatinine Clearance: 52.9 mL/min (by C-G formula based on SCr of 0.75 mg/dL). []      On intermittent HD, scheduled: []      On CRRT    Antimicrobial dosage has been changed to:  Tamiflu 75mg  PO x1 then 30mg  PO BID x9 doses  Additional comments:   Thank you for allowing pharmacy to be a part of this patient's care.  Netta Cedars, [facility] 11/25/2022 12:25 AM

## 2022-11-25 NOTE — Assessment & Plan Note (Signed)
Given severity of illness requiring hospitalization will go ahead and start on renal dosed Tamiflu.

## 2022-11-25 NOTE — Assessment & Plan Note (Addendum)
New hypoxia with SpO2 88% on room air.  Multifactorial due to right upper lobe pneumonia and COPD exacerbation.  Increased work of breathing on admission however SpO2 stable on 2 L O2 via [place]. -Continue antibiotics, steroids, nebulizers, and supplemental oxygen as above -BiPAP if needed

## 2022-11-26 DIAGNOSIS — J9601 Acute respiratory failure with hypoxia: Secondary | ICD-10-CM | POA: Diagnosis not present

## 2022-11-26 LAB — BASIC METABOLIC PANEL
Anion gap: 9 (ref 5–15)
BUN: 18 mg/dL (ref 8–23)
CO2: 23 mmol/L (ref 22–32)
Calcium: 9.3 mg/dL (ref 8.9–10.3)
Chloride: 96 mmol/L — ABNORMAL LOW (ref 98–111)
Creatinine, Ser: 0.7 mg/dL (ref 0.44–1.00)
GFR, Estimated: >60 mL/min (ref >60)
Glucose, Bld: 171 mg/dL — ABNORMAL HIGH (ref 70–99)
Potassium: 4.8 mmol/L (ref 3.5–5.1)
Sodium: 128 mmol/L — ABNORMAL LOW (ref 135–145)

## 2022-11-26 LAB — OSMOLALITY, URINE: Osmolality, Ur: 219 mOsm/kg — ABNORMAL LOW (ref 300–900)

## 2022-11-26 LAB — URINALYSIS, ROUTINE W REFLEX MICROSCOPIC
Bilirubin Urine: NEGATIVE
Glucose, UA: NEGATIVE mg/dL
Hgb urine dipstick: NEGATIVE
Ketones, ur: NEGATIVE mg/dL
Leukocytes,Ua: NEGATIVE
Nitrite: NEGATIVE
Protein, ur: NEGATIVE mg/dL
Specific Gravity, Urine: 1.008 (ref 1.005–1.030)
pH: 5 (ref 5.0–8.0)

## 2022-11-26 LAB — CBC
HCT: 34.8 % — ABNORMAL LOW (ref 36.0–46.0)
Hemoglobin: 10.9 g/dL — ABNORMAL LOW (ref 12.0–15.0)
MCH: 27.5 pg (ref 26.0–34.0)
MCHC: 31.3 g/dL (ref 30.0–36.0)
MCV: 87.9 fL (ref 80.0–100.0)
Platelets: 341 10*3/uL (ref 150–400)
RBC: 3.96 MIL/uL (ref 3.87–5.11)
RDW: 13.9 % (ref 11.5–15.5)
WBC: 9.7 10*3/uL (ref 4.0–10.5)
nRBC: 0 % (ref 0.0–0.2)

## 2022-11-26 LAB — STREP PNEUMONIAE URINARY ANTIGEN: Strep Pneumo Urinary Antigen: NEGATIVE

## 2022-11-26 LAB — SODIUM, URINE, RANDOM: Sodium, Ur: <10 mmol/L

## 2022-11-26 LAB — MAGNESIUM: Magnesium: 2.1 mg/dL (ref 1.7–2.4)

## 2022-11-26 MED ORDER — GUAIFENESIN-DM 100-10 MG/5ML PO SYRP: 10.0000 mL | ORAL_SOLUTION | Freq: Four times a day (QID) | ORAL | Status: DC

## 2022-11-26 MED ORDER — POLYETHYLENE GLYCOL 3350 17 G PO PACK
17.0000 g | PACK | Freq: Every day | ORAL | Status: DC
  Administered 2022-11-27 – 2022-12-04 (×8): 17 g via ORAL
  Filled 2022-11-26 (×9): qty 1

## 2022-11-26 MED ORDER — MIRTAZAPINE 15 MG PO TABS
15.0000 mg | ORAL_TABLET | Freq: Every day | ORAL | Status: DC
  Administered 2022-11-26 – 2022-12-03 (×8): 15 mg via ORAL
  Filled 2022-11-26 (×8): qty 1

## 2022-11-26 MED ORDER — LEVALBUTEROL HCL 0.63 MG/3ML IN NEBU
0.6300 mg | INHALATION_SOLUTION | Freq: Two times a day (BID) | RESPIRATORY_TRACT | Status: DC
  Administered 2022-11-26 – 2022-11-27 (×2): 0.63 mg via RESPIRATORY_TRACT
  Filled 2022-11-26 (×2): qty 3

## 2022-11-26 MED ORDER — SENNOSIDES-DOCUSATE SODIUM 8.6-50 MG PO TABS
1.0000 | ORAL_TABLET | Freq: Two times a day (BID) | ORAL | Status: DC
  Administered 2022-11-26 – 2022-12-04 (×17): 1 via ORAL
  Filled 2022-11-26 (×17): qty 1

## 2022-11-26 MED ORDER — GUAIFENESIN-DM 100-10 MG/5ML PO SYRP: 10.0000 mL | ORAL_SOLUTION | Freq: Four times a day (QID) | ORAL | Status: DC | PRN

## 2022-11-26 NOTE — Progress Notes (Signed)
Patient still in A-fib, fluctuating heart rate, on PO Cardizem.  Dr. Tawanna Solo made aware. Will continue to assess patient.

## 2022-11-26 NOTE — Progress Notes (Signed)
OT Cancellation Note  Patient Details Name: [name] MRN: [id number] DOB: [birth year]   Cancelled Treatment:    Reason Eval/Treat Not Completed: Other (comment) Patient was in room with continued elevated HR at rest with patient reporting increased burning in R elbow IV site with no fluids running. Patient attempting to pull at line to "stop the burning" Nurse called into room. Nurse in room to assist patient at this time.OT to continue to follow and check back as schedule will allow.   Rennie Plowman, MS Acute Rehabilitation Department Office# [phone]  11/26/2022, 1:04 PM

## 2022-11-26 NOTE — Evaluation (Signed)
Physical Therapy Evaluation Patient Details Name: [name] MRN: [id number] DOB: 20-Jul-1933 Today's Date: 11/26/2022  History of Present Illness  89 yo female admitted with acute respiratory failure, Pna, +flu, COPD exac. Hx of COVID, COPD-not on O2, Afib  Clinical Impression  On eval, pt required Min A for mobility. She was able to stand and take a few side steps along the bedside. HR up to 143 bpm, O2 96% on 3L. Audible wheezing and dyspnea 2/4 with minimal activity. Will plan to follow and progress activity as tolerated. Daughter was present during session-plan is for pt to d/c back home once medically stable. Will recommend HHPT f/u.        Recommendations for follow up therapy are one component of a multi-disciplinary discharge planning process, led by the attending physician.  Recommendations may be updated based on patient status, additional functional criteria and insurance authorization.  Follow Up Recommendations Home health PT      Assistance Recommended at Discharge Frequent or constant Supervision/Assistance  Patient can return home with the following  Assistance with cooking/housework;Assist for transportation;Help with stairs or ramp for entrance;A little help with walking and/or transfers;A little help with bathing/dressing/bathroom    Equipment Recommendations None recommended by PT  Recommendations for Other Services       Functional Status Assessment Patient has had a recent decline in their functional status and demonstrates the ability to make significant improvements in function in a reasonable and predictable amount of time.     Precautions / Restrictions Precautions Precaution Comments: monitor HR, O2 Restrictions Weight Bearing Restrictions: No      Mobility  Bed Mobility Overal bed mobility: Needs Assistance Bed Mobility: Supine to Sit, Sit to Supine     Supine to sit: Min guard, HOB elevated Sit to supine: Min guard, HOB elevated   General  bed mobility comments: Min guard A for mobility.    Transfers Overall transfer level: Needs assistance Equipment used: 1 person hand held assist Transfers: Sit to/from Stand Sit to Stand: Min assist           General transfer comment: Small assist to steady. Cues for safety.    Ambulation/Gait Ambulation/Gait assistance: Min assist             General Gait Details: side steps along bedside with 1 HHA and pt holding on to bedrail. assist to steady.  Stairs            Wheelchair Mobility    Modified Rankin (Stroke Patients Only)       Balance Overall balance assessment: Needs assistance         Standing balance support: Bilateral upper extremity supported, During functional activity, Reliant on assistive device for balance Standing balance-Leahy Scale: Poor                               Pertinent Vitals/Pain Pain Assessment Pain Assessment: No/denies pain    Home Living Family/patient expects to be discharged to:: Private residence Living Arrangements: Children Available Help at Discharge: Family;Available PRN/intermittently Type of Home: House Home Access: Stairs to enter   Entrance Stairs-Number of Steps: 1   Home Layout: Able to live on main level with bedroom/bathroom Home Equipment: Rollator (4 wheels)      Prior Function Prior Level of Function : Independent/Modified Independent             Mobility Comments: uses rollator. ADLs Comments: receives help in/out of shower  from daughter     Hand Dominance   Dominant Hand: Right    Extremity/Trunk Assessment   Upper Extremity Assessment Upper Extremity Assessment: Generalized weakness    Lower Extremity Assessment Lower Extremity Assessment: Generalized weakness    Cervical / Trunk Assessment Cervical / Trunk Assessment: Normal  Communication   Communication: HOH  Cognition Arousal/Alertness: Awake/alert Behavior During Therapy: WFL for tasks  assessed/performed Overall Cognitive Status: Within Functional Limits for tasks assessed                                          General Comments      Exercises     Assessment/Plan    PT Assessment Patient needs continued PT services  PT Problem List Decreased mobility;Decreased activity tolerance;Decreased balance;Decreased strength;Decreased knowledge of use of DME       PT Treatment Interventions DME instruction;Gait training;Therapeutic exercise;Balance training;Functional mobility training;Therapeutic activities;Patient/family education    PT Goals (Current goals can be found in the Care Plan section)  Acute Rehab PT Goals Patient Stated Goal: home soon PT Goal Formulation: With patient/family Time For Goal Achievement: 12/10/22 Potential to Achieve Goals: Good    Frequency Min 3X/week     Co-evaluation               AM-PAC PT "6 Clicks" Mobility  Outcome Measure Help needed turning from your back to your side while in a flat bed without using bedrails?: A Little Help needed moving from lying on your back to sitting on the side of a flat bed without using bedrails?: A Little Help needed moving to and from a bed to a chair (including a wheelchair)?: A Little Help needed standing up from a chair using your arms (e.g., wheelchair or bedside chair)?: A Little Help needed to walk in hospital room?: A Little Help needed climbing 3-5 steps with a railing? : A Lot 6 Click Score: 17    End of Session Equipment Utilized During Treatment: Oxygen Activity Tolerance:  (limited by HR, O2) Patient left: in bed;with call bell/phone within reach;with bed alarm set;with family/visitor present   PT Visit Diagnosis: Muscle weakness (generalized) (M62.81);Difficulty in walking, not elsewhere classified (R26.2)    Time: 4196-2229 PT Time Calculation (min) (ACUTE ONLY): 15 min   Charges:   PT Evaluation $PT Eval Low Complexity: [place], PT Acute Rehabilitation  Office: [phone]

## 2022-11-26 NOTE — Progress Notes (Addendum)
PROGRESS NOTE  [name]  VQQ:[id number] DOB: [birth year] DOA: 11/24/2022 PCP: Hadley Pen, MD   Brief Narrative: Patient is a 89 year old female with history of paroxysmal A-fib on Eliquis, COPD not on supplemental oxygen at home, TIA, hypertension, hyperlipidemia who presented to the emergency room with complaints of shortness of breath, fast heartbeat.  She was having flulike symptoms for a week and was tested positive for influenza B as an outpatient.  Chest x-ray had shown right upper lobe infiltrate was prescribed antibiotic but she had progressive worsening shortness of breath, cough.  On presentation, she was in A-fib with RVR.  She was saturating 88% on room air.  She had to be   placed on 2 L of oxygen per minute.  Chest x-ray showed moderately severe right upper lobe infiltrate, emphysematous changes.  Patient is currently being managed for COPD exacerbation, pneumonia, A-fib with RVR.  Cardiology consulted today.  Assessment & Plan:  Principal Problem:   Acute respiratory failure with hypoxia (HCC) Active Problems:   Right upper lobe pneumonia   COPD with acute exacerbation (HCC)   Paroxysmal atrial fibrillation (HCC)   Influenza B   Hyponatremia   Essential hypertension   Hyperlipidemia   Acute respiratory failure with hypoxia: Secondary to pneumonia, COPD exacerbation, flu.  Not on oxygen at home.  Hypoxia on arrival.  Continue steroids, bronchodilators, supplemental oxygen as needed.  Continue flutter valve, incentive centimeter.  On 4 L of oxygen this morning.  Will try to wean.  Continue incentive centimeter  Right upper lobe pneumonia: Diagnosed with pneumonia as an outpatient, was on oral antibiotics for that did not help.  Currently on ceftriaxone, transition.  Cultures have been negative so far.  Strep pneumoniae, Legionella antigen negative pending. CT chest without contrast done on 1/7 showed extensive heterogenous, groundglass airspace opacity predominantly in  the right upper lobe consistent with multifocal infection, pulmonary edema.  Also multiple calcified mediastinal, right hilar nodes consistent with  sequela of prior granulomatous infection.  COPD with acute exacerbation: Presented with wheezing, hypoxia.  Likely triggered with flu and pneumonia.  Continue steroids, Pulmicort, bronchodilators.  Continue Solu-Medrol today.  She is wheezing today.  Low threshold to call pulmonology consult if she continues to require oxygen, wheezing  Influenza B: Diagnosed as an outpatient.  Started on renally dosed Tamiflu  A-fib with RVR: Presented with heart rate in the range of 120s.  Required diltiazem infusion in the emergency department.  On Eliquis for anticoagulation.  Remains in A-fib today with heart rate ranging in the 110s to 120s.  Was following with EP as an outpatient.  Will get cardiology consult  Hyponatremia: Sodium of 124 on presentation, 128 today.  Difficulty in assessing volume status.  Elevated BNP.  Given a dose of Lasix 40 mg in the emergency department.  Continue to monitor.  Home Lasix, spironolactone on hold.  Hyperlipidemia: Continue statin  Hypertension: On benazepril, amlodipine at home.  Obesity: BMI of 32.9.  Debility/deconditioning: Will consult PT/OT        DVT prophylaxis: apixaban (ELIQUIS) tablet 5 mg     Code Status: Full Code  Family Communication: daughter at bedside  Patient status:Inpatient  Patient is from :Home  Anticipated discharge EP:[facility] vs [facility]  Estimated DC date:Not sure   Consultants: Cardiology  Procedures:None  Antimicrobials:  Anti-infectives (From admission, onward)    Start     Dose/Rate Route Frequency Ordered Stop   11/25/22 2300  azithromycin (ZITHROMAX) 500 mg in sodium chloride 0.9 %  250 mL IVPB        500 mg 250 mL/hr over 60 Minutes Intravenous Every 24 hours 11/24/22 2349 11/30/22 2259   11/25/22 2200  cefTRIAXone (ROCEPHIN) 2 g in sodium chloride 0.9 % 100 mL IVPB         2 g 200 mL/hr over 30 Minutes Intravenous Every 24 hours 11/24/22 2349 11/30/22 2159   11/25/22 1000  oseltamivir (TAMIFLU) capsule 30 mg        30 mg Oral 2 times daily 11/25/22 0024 11/29/22 2159   11/25/22 0030  oseltamivir (TAMIFLU) capsule 75 mg        75 mg Oral NOW 11/24/22 2349 11/25/22 0158   11/24/22 2245  levofloxacin (LEVAQUIN) IVPB 750 mg  Status:  Discontinued        750 mg 100 mL/hr over 90 Minutes Intravenous Every 24 hours 11/24/22 2231 11/24/22 2349       Subjective:  Patient seen and examined at bedside today.  On 3 to 4 L of oxygen per minute.  Daughter at bedside.  As per the daughter, she feels better today.  States he is less short of breath and less coughing.  EKG monitor at bedside shows heart rate in the range of 110-120.  Objective: Vitals:   11/26/22 0430 11/26/22 0635 11/26/22 0730 11/26/22 0944  BP: 120/75 108/71  121/74  Pulse:    (!) 113  Resp:      Temp:      TempSrc:      SpO2:   97%   Weight:      Height:        Intake/Output Summary (Last 24 hours) at 11/26/2022 1000 Last data filed at 11/26/2022 1093 Gross per 24 hour  Intake 846.9 ml  Output 1300 ml  Net -453.1 ml   Filed Weights   11/24/22 2317 11/25/22 0500 11/26/22 0343  Weight: 87 kg 90.6 kg 87.3 kg    Examination:  General exam: Overall comfortable, not in distress, weak, deconditioned HEENT: PERRL Respiratory system: Bilateral expiratory wheezing Cardiovascular system: Irregularly irregular rhythm, RVR Gastrointestinal system: Abdomen is nondistended, soft and nontender. Central nervous system: Alert and oriented Extremities: trace bilateral lower extremity edema, no clubbing ,no cyanosis Skin: No rashes, no ulcers,no icterus     Data Reviewed: I have personally reviewed following labs and imaging studies  CBC: Recent Labs  Lab 11/24/22 2117 11/25/22 0542 11/26/22 0534  WBC 11.1* 9.1 9.7  HGB 10.8* 10.6* 10.9*  HCT 33.2* 32.3* 34.8*  MCV 85.8 84.3 87.9  PLT  313 312 341   Basic Metabolic Panel: Recent Labs  Lab 11/24/22 2117 11/25/22 0548 11/26/22 0534  NA 124* 124* 128*  K 5.5* 4.9  4.9 4.8  CL 93* 92* 96*  CO2 22 19* 23  GLUCOSE 132* 138* 171*  BUN 16 14 18   CREATININE 0.75 0.62 0.70  CALCIUM 9.2 9.1 9.3  MG  --  1.3* 2.1     Recent Results (from the past 240 hour(s))  Resp panel by RT-PCR (RSV, Flu A&B, Covid) Anterior Nasal Swab     Status: Abnormal   Collection Time: 11/24/22 11:08 PM   Specimen: Anterior Nasal Swab  Result Value Ref Range Status   SARS Coronavirus 2 by RT PCR NEGATIVE NEGATIVE Final    Comment: (NOTE) SARS-CoV-2 target nucleic acids are NOT DETECTED.  The SARS-CoV-2 RNA is generally detectable in upper respiratory specimens during the acute phase of infection. The lowest concentration of SARS-CoV-2 viral copies this  assay can detect is 138 copies/mL. A negative result does not preclude SARS-Cov-2 infection and should not be used as the sole basis for treatment or other patient management decisions. A negative result may occur with  improper specimen collection/handling, submission of specimen other than nasopharyngeal swab, presence of viral mutation(s) within the areas targeted by this assay, and inadequate number of viral copies(<138 copies/mL). A negative result must be combined with clinical observations, patient history, and epidemiological information. The expected result is Negative.  Fact Sheet for Patients:  EntrepreneurPulse.com.au  Fact Sheet for Healthcare Providers:  IncredibleEmployment.be  This test is no t yet approved or cleared by the [place] FDA and  has been authorized for detection and/or diagnosis of SARS-CoV-2 by FDA under an Emergency Use Authorization (EUA). This EUA will remain  in effect (meaning this test can be used) for the duration of the COVID-19 declaration under Section 564(b)(1) of the Act, 21 U.S.C.section  360bbb-3(b)(1), unless the authorization is terminated  or revoked sooner.       Influenza A by PCR NEGATIVE NEGATIVE Final   Influenza B by PCR POSITIVE (A) NEGATIVE Final    Comment: (NOTE) The Xpert Xpress SARS-CoV-2/FLU/RSV plus assay is intended as an aid in the diagnosis of influenza from Nasopharyngeal swab specimens and should not be used as a sole basis for treatment. Nasal washings and aspirates are unacceptable for Xpert Xpress SARS-CoV-2/FLU/RSV testing.  Fact Sheet for Patients: EntrepreneurPulse.com.au  Fact Sheet for Healthcare Providers: IncredibleEmployment.be  This test is not yet approved or cleared by the [place] FDA and has been authorized for detection and/or diagnosis of SARS-CoV-2 by FDA under an Emergency Use Authorization (EUA). This EUA will remain in effect (meaning this test can be used) for the duration of the COVID-19 declaration under Section 564(b)(1) of the Act, 21 U.S.C. section 360bbb-3(b)(1), unless the authorization is terminated or revoked.     Resp Syncytial Virus by PCR NEGATIVE NEGATIVE Final    Comment: (NOTE) Fact Sheet for Patients: EntrepreneurPulse.com.au  Fact Sheet for Healthcare Providers: IncredibleEmployment.be  This test is not yet approved or cleared by the [place] FDA and has been authorized for detection and/or diagnosis of SARS-CoV-2 by FDA under an Emergency Use Authorization (EUA). This EUA will remain in effect (meaning this test can be used) for the duration of the COVID-19 declaration under Section 564(b)(1) of the Act, 21 U.S.C. section 360bbb-3(b)(1), unless the authorization is terminated or revoked.  Performed at [facility], [place] [address] [place], [place] [address]      Radiology Studies: CT CHEST WO CONTRAST  Result Date: 11/25/2022 CLINICAL DATA:  Shortness of breath, pneumonia EXAM: CT CHEST  WITHOUT CONTRAST TECHNIQUE: Multidetector CT imaging of the chest was performed following the standard protocol without IV contrast. RADIATION DOSE REDUCTION: This exam was performed according to the departmental dose-optimization program which includes automated exposure control, adjustment of the mA and/or kV according to patient size and/or use of iterative reconstruction technique. COMPARISON:  None Available. FINDINGS: Cardiovascular: Aortic atherosclerosis. Cardiomegaly. Right coronary artery calcifications. Enlargement of the main pulmonary artery measuring up to 3.9 cm in caliber. No pericardial effusion. Mediastinum/Nodes: Multiple calcified mediastinal and right hilar lymph nodes. Thyroid gland, trachea, and esophagus demonstrate no significant findings. Lungs/Pleura: Small right pleural effusion and associated atelectasis or consolidation. Extensive heterogeneous and ground-glass airspace opacity as well as interlobular septal thickening, predominantly seen in the right upper lobe (series 7, image 50). Trace left pleural effusion with associated dependent atelectasis  or consolidation. Upper Abdomen: No acute abnormality. Musculoskeletal: No chest wall abnormality. Multiple subacute appearing fractures of the lateral right ribs, involving at least the eighth and ninth ribs (series 7, image 105). IMPRESSION: 1. Extensive heterogeneous and ground-glass airspace opacity as well as interlobular septal thickening in the right lung, predominantly seen in the right upper lobe. Findings are consistent with multifocal infection and/or pulmonary edema. 2. Small right and trace left pleural effusions with associated atelectasis or consolidation. 3. Multiple calcified mediastinal and right hilar lymph nodes, consistent with sequelae of prior granulomatous infection. There is most likely some underlying chronic scarring and architectural distortion of the right lung secondary to this process. 4. Cardiomegaly and  coronary artery disease. 5. Enlargement of the main pulmonary artery, as can be seen in pulmonary hypertension. 6. Multiple subacute appearing fractures of the lateral right ribs, involving at least the eighth and ninth ribs. Aortic Atherosclerosis (ICD10-I70.0). Electronically Signed   By: Jearld Lesch M.D.   On: 11/25/2022 17:48   DG Chest 2 View  Result Date: 11/24/2022 CLINICAL DATA:  Shortness of breath. EXAM: CHEST - 2 VIEW COMPARISON:  November 20, 2022 FINDINGS: The heart size and mediastinal contours are within normal limits. The lungs are hyperinflated with mild right-sided volume loss noted. Chronic appearing increased interstitial lung markings are seen. Moderate severity right upper lobe infiltrate is also present. This is increased in severity when compared to the prior study. Mild areas of atelectasis and/or infiltrate are also seen within the bilateral lung bases. There is a small right pleural effusion. No pneumothorax is identified. The visualized skeletal structures are unremarkable. IMPRESSION: 1. COPD with moderate severity right upper lobe infiltrate. 2. Mild bibasilar atelectasis and/or infiltrate. 3. Small right pleural effusion. Electronically Signed   By: Aram Candela M.D.   On: 11/24/2022 22:46    Scheduled Meds:  apixaban  5 mg Oral BID   budesonide (PULMICORT) nebulizer solution  0.5 mg Nebulization TID   diltiazem  30 mg Oral Q6H   gabapentin  300 mg Oral Daily   And   gabapentin  600 mg Oral QHS   guaiFENesin  600 mg Oral BID   labetalol  200 mg Oral BID   levalbuterol  0.63 mg Nebulization BID   methylPREDNISolone (SOLU-MEDROL) injection  80 mg Intravenous Q12H   oseltamivir  30 mg Oral BID   pantoprazole  40 mg Oral Daily   pravastatin  20 mg Oral QHS   sodium chloride flush  3 mL Intravenous Q12H   Continuous Infusions:  azithromycin 500 mg (11/25/22 2338)   cefTRIAXone (ROCEPHIN)  IV 2 g (11/25/22 2143)   diltiazem (CARDIZEM) infusion Stopped (11/25/22  1147)     LOS: 2 days   Burnadette Pop, MD [facility] P1/06/2023, 10:00 AM

## 2022-11-27 DIAGNOSIS — J9601 Acute respiratory failure with hypoxia: Secondary | ICD-10-CM | POA: Diagnosis not present

## 2022-11-27 DIAGNOSIS — I5032 Chronic diastolic (congestive) heart failure: Secondary | ICD-10-CM

## 2022-11-27 DIAGNOSIS — J441 Chronic obstructive pulmonary disease with (acute) exacerbation: Secondary | ICD-10-CM

## 2022-11-27 DIAGNOSIS — I480 Paroxysmal atrial fibrillation: Secondary | ICD-10-CM

## 2022-11-27 DIAGNOSIS — I4891 Unspecified atrial fibrillation: Secondary | ICD-10-CM

## 2022-11-27 DIAGNOSIS — J101 Influenza due to other identified influenza virus with other respiratory manifestations: Secondary | ICD-10-CM

## 2022-11-27 LAB — BASIC METABOLIC PANEL
Anion gap: 8 (ref 5–15)
BUN: 27 mg/dL — ABNORMAL HIGH (ref 8–23)
CO2: 25 mmol/L (ref 22–32)
Calcium: 9 mg/dL (ref 8.9–10.3)
Chloride: 98 mmol/L (ref 98–111)
Creatinine, Ser: 0.88 mg/dL (ref 0.44–1.00)
GFR, Estimated: >60 mL/min (ref >60)
Glucose, Bld: 158 mg/dL — ABNORMAL HIGH (ref 70–99)
Potassium: 5 mmol/L (ref 3.5–5.1)
Sodium: 131 mmol/L — ABNORMAL LOW (ref 135–145)

## 2022-11-27 LAB — LEGIONELLA PNEUMOPHILA SEROGP 1 UR AG: L. pneumophila Serogp 1 Ur Ag: NEGATIVE

## 2022-11-27 MED ORDER — BUDESONIDE 0.5 MG/2ML IN SUSP
0.5000 mg | Freq: Two times a day (BID) | RESPIRATORY_TRACT | Status: DC
  Administered 2022-11-27: 0.5 mg via RESPIRATORY_TRACT
  Filled 2022-11-27: qty 2

## 2022-11-27 MED ORDER — REVEFENACIN 175 MCG/3ML IN SOLN
175.0000 ug | Freq: Every day | RESPIRATORY_TRACT | Status: DC
  Administered 2022-11-28 – 2022-12-04 (×7): 175 ug via RESPIRATORY_TRACT
  Filled 2022-11-27 (×8): qty 3

## 2022-11-27 MED ORDER — METHYLPREDNISOLONE SODIUM SUCC 125 MG IJ SOLR
60.0000 mg | INTRAMUSCULAR | Status: DC
  Administered 2022-11-27 – 2022-11-28 (×2): 60 mg via INTRAVENOUS
  Filled 2022-11-27 (×2): qty 2

## 2022-11-27 MED ORDER — ALBUTEROL SULFATE (2.5 MG/3ML) 0.083% IN NEBU: 3.0000 mL | INHALATION_SOLUTION | RESPIRATORY_TRACT | Status: DC | PRN

## 2022-11-27 MED ORDER — SODIUM CHLORIDE 3 % IN NEBU
4.0000 mL | INHALATION_SOLUTION | Freq: Two times a day (BID) | RESPIRATORY_TRACT | Status: AC
  Administered 2022-11-27 – 2022-12-01 (×8): 4 mL via RESPIRATORY_TRACT
  Filled 2022-11-27 (×8): qty 4

## 2022-11-27 MED ORDER — FLUTICASONE FUROATE-VILANTEROL 200-25 MCG/ACT IN AEPB
1.0000 | INHALATION_SPRAY | Freq: Every day | RESPIRATORY_TRACT | Status: DC
  Filled 2022-11-27: qty 28

## 2022-11-27 MED ORDER — DILTIAZEM HCL-DEXTROSE 125-5 MG/125ML-% IV SOLN (PREMIX)
5.0000 mg/h | INTRAVENOUS | Status: DC
  Administered 2022-11-27 – 2022-11-29 (×3): 5 mg/h via INTRAVENOUS
  Administered 2022-11-30: 7.5 mg/h via INTRAVENOUS
  Filled 2022-11-27 (×5): qty 125

## 2022-11-27 MED ORDER — HYDROCOD POLI-CHLORPHE POLI ER 10-8 MG/5ML PO SUER
5.0000 mL | Freq: Two times a day (BID) | ORAL | Status: DC
  Administered 2022-11-27 – 2022-11-28 (×3): 5 mL via ORAL
  Filled 2022-11-27 (×3): qty 5

## 2022-11-27 MED ORDER — ARFORMOTEROL TARTRATE 15 MCG/2ML IN NEBU
15.0000 ug | INHALATION_SOLUTION | Freq: Two times a day (BID) | RESPIRATORY_TRACT | Status: DC
  Administered 2022-11-27 – 2022-12-04 (×14): 15 ug via RESPIRATORY_TRACT
  Filled 2022-11-27 (×14): qty 2

## 2022-11-27 MED ORDER — IPRATROPIUM-ALBUTEROL 0.5-2.5 (3) MG/3ML IN SOLN
3.0000 mL | Freq: Four times a day (QID) | RESPIRATORY_TRACT | Status: DC | PRN
  Administered 2022-11-28: 3 mL via RESPIRATORY_TRACT
  Filled 2022-11-27: qty 3

## 2022-11-27 MED ORDER — UMECLIDINIUM BROMIDE 62.5 MCG/ACT IN AEPB
1.0000 | INHALATION_SPRAY | Freq: Every day | RESPIRATORY_TRACT | Status: DC
  Filled 2022-11-27: qty 7

## 2022-11-27 MED ORDER — BUDESONIDE 0.5 MG/2ML IN SUSP
0.5000 mg | Freq: Two times a day (BID) | RESPIRATORY_TRACT | Status: DC
  Administered 2022-11-28 – 2022-12-04 (×13): 0.5 mg via RESPIRATORY_TRACT
  Filled 2022-11-27 (×13): qty 2

## 2022-11-27 MED ORDER — TIOTROPIUM BROMIDE MONOHYDRATE 18 MCG IN CAPS: 18.0000 ug | ORAL_CAPSULE | Freq: Every day | RESPIRATORY_TRACT | Status: DC

## 2022-11-27 NOTE — Progress Notes (Signed)
PROGRESS NOTE  [name]  GGY:[id number] DOB: 03-05-1933 DOA: 11/24/2022 PCP: Myrlene Broker, MD   Brief Narrative: Patient is a 89 year old female with history of paroxysmal A-fib on Eliquis, COPD not on supplemental oxygen at home, TIA, hypertension, hyperlipidemia who presented to the emergency room with complaints of shortness of breath, fast heartbeat.  She was having flulike symptoms for a week and was tested positive for influenza B as an outpatient.  Chest x-ray had shown right upper lobe infiltrate was prescribed antibiotic but she had progressive worsening shortness of breath, cough.  On presentation, she was in A-fib with RVR.  She was saturating 88% on room air.  She had to be   placed on 2 L of oxygen per minute.  Chest x-ray showed moderately severe right upper lobe infiltrate, emphysematous changes.  Patient is currently being managed for COPD exacerbation, pneumonia, A-fib with RVR.  Cardiology,PCCM consulted.  Assessment & Plan:  Principal Problem:   Acute respiratory failure with hypoxia (HCC) Active Problems:   Right upper lobe pneumonia   COPD with acute exacerbation (HCC)   Paroxysmal atrial fibrillation (HCC)   Influenza B   Hyponatremia   Essential hypertension   Hyperlipidemia   Acute respiratory failure with hypoxia: Secondary to pneumonia, COPD exacerbation, flu.  Not on oxygen at home.  Hypoxic on arrival.  Continue steroids, bronchodilators, supplemental oxygen as needed.  Continue flutter valve, incentive centimeter.  On 4 L of oxygen this morning.  Will try to wean but not successful.  Continue incentive spirometer.  PCCM consulted today.  Wheezing is better today  Right upper lobe pneumonia: Diagnosed with pneumonia as an outpatient, was on oral antibiotics for that did not help.  Currently on ceftriaxone, transition.  Cultures have been negative so far.  Strep pneumoniae negative, Legionella antigen pending. CT chest without contrast done on 1/7 showed  extensive heterogenous, groundglass airspace opacity predominantly in the right upper lobe consistent with multifocal infection, pulmonary edema.  Also multiple calcified mediastinal, right hilar nodes consistent with  sequela of prior granulomatous infection.  COPD with acute exacerbation: Presented with wheezing, hypoxia.  Likely triggered with flu and pneumonia.  Continue steroids, Pulmicort, bronchodilators.  Continue Solu-Medrol today.  Wheezing is better.  Consulted pulmonology, does not follow with any pulmonologist on outpatient  Influenza B: Diagnosed as an outpatient.  Started on renally dosed Tamiflu  A-fib with RVR: Presented with heart rate in the range of 120s.  Required diltiazem infusion in the emergency department.  On Eliquis for anticoagulation.  Remains in A-fib today with heart rate ranging in the 110s to 120s.  Was following with EP as an outpatient. cardiology consulted  Hyponatremia: Sodium of 124 on presentation, 131 today.  Difficulty in assessing volume status.  Elevated BNP.  Given a dose of Lasix 40 mg in the emergency department.  Continue to monitor.  Home Lasix, spironolactone on hold.  Hyperlipidemia: Continue statin  Hypertension: On benazepril, amlodipine at home.  Obesity: BMI of 32.9.  Debility/deconditioning: Home health recommended by PT/OT        DVT prophylaxis: apixaban (ELIQUIS) tablet 5 mg     Code Status: Full Code  Family Communication: daughter at bedside  Patient status:Inpatient  Patient is from :Home  Anticipated discharge OJ:[id number]   Estimated DC date:Not sure, needs to optimize heart rate, respiratory status before discharge   Consultants: Cardiology, pulmonology  Procedures:None  Antimicrobials:  Anti-infectives (From admission, onward)    Start     Dose/Rate Route Frequency Ordered  Stop   11/25/22 2300  azithromycin (ZITHROMAX) 500 mg in sodium chloride 0.9 % 250 mL IVPB        500 mg 250 mL/hr over 60 Minutes  Intravenous Every 24 hours 11/24/22 2349 11/30/22 2259   11/25/22 2200  cefTRIAXone (ROCEPHIN) 2 g in sodium chloride 0.9 % 100 mL IVPB        2 g 200 mL/hr over 30 Minutes Intravenous Every 24 hours 11/24/22 2349 11/30/22 2159   11/25/22 1000  oseltamivir (TAMIFLU) capsule 30 mg        30 mg Oral 2 times daily 11/25/22 0024 11/29/22 2159   11/25/22 0030  oseltamivir (TAMIFLU) capsule 75 mg        75 mg Oral NOW 11/24/22 2349 11/25/22 0158   11/24/22 2245  levofloxacin (LEVAQUIN) IVPB 750 mg  Status:  Discontinued        750 mg 100 mL/hr over 90 Minutes Intravenous Every 24 hours 11/24/22 2231 11/24/22 2349       Subjective:  Patient seen and examined at bedside today.  Heart heart rate is still ranging between 110s to 120.  Continues to require 4 L of oxygen but she says she is feeling better.  She noticed to be coughing today.  She does not complain of worsening shortness of breath.  Objective: Vitals:   11/27/22 0457 11/27/22 0500 11/27/22 0836 11/27/22 0911  BP: (!) 135/94  122/77   Pulse:   (!) 112   Resp: (!) 21     Temp: 98.9 F (37.2 C)     TempSrc: Oral     SpO2: 99%   97%  Weight:  89.2 kg    Height:        Intake/Output Summary (Last 24 hours) at 11/27/2022 1124 Last data filed at 11/26/2022 1800 Gross per 24 hour  Intake --  Output 700 ml  Net -700 ml   Filed Weights   11/25/22 0500 11/26/22 0343 11/27/22 0500  Weight: 90.6 kg 87.3 kg 89.2 kg    Examination:   General exam: Overall comfortable, not in distress, weak, deconditioned HEENT: PERRL Respiratory system: Bilateral expiratory wheeze, diminished sounds Cardiovascular system: A-fib with RVR Gastrointestinal system: Abdomen is nondistended, soft and nontender. Central nervous system: Alert and oriented Extremities: No edema, no clubbing ,no cyanosis Skin: No rashes, no ulcers,no icterus     Data Reviewed: I have personally reviewed following labs and imaging studies  CBC: Recent Labs  Lab  11/24/22 2117 11/25/22 0542 11/26/22 0534  WBC 11.1* 9.1 9.7  HGB 10.8* 10.6* 10.9*  HCT 33.2* 32.3* 34.8*  MCV 85.8 84.3 87.9  PLT 313 312 341   Basic Metabolic Panel: Recent Labs  Lab 11/24/22 2117 11/25/22 0548 11/26/22 0534 11/27/22 0507  NA 124* 124* 128* 131*  K 5.5* 4.9  4.9 4.8 5.0  CL 93* 92* 96* 98  CO2 22 19* 23 25  GLUCOSE 132* 138* 171* 158*  BUN 16 14 18  27*  CREATININE 0.75 0.62 0.70 0.88  CALCIUM 9.2 9.1 9.3 9.0  MG  --  1.3* 2.1  --      Recent Results (from the past 240 hour(s))  Resp panel by RT-PCR (RSV, Flu A&B, Covid) Anterior Nasal Swab     Status: Abnormal   Collection Time: 11/24/22 11:08 PM   Specimen: Anterior Nasal Swab  Result Value Ref Range Status   SARS Coronavirus 2 by RT PCR NEGATIVE NEGATIVE Final    Comment: (NOTE) SARS-CoV-2 target nucleic acids  are NOT DETECTED.  The SARS-CoV-2 RNA is generally detectable in upper respiratory specimens during the acute phase of infection. The lowest concentration of SARS-CoV-2 viral copies this assay can detect is 138 copies/mL. A negative result does not preclude SARS-Cov-2 infection and should not be used as the sole basis for treatment or other patient management decisions. A negative result may occur with  improper specimen collection/handling, submission of specimen other than nasopharyngeal swab, presence of viral mutation(s) within the areas targeted by this assay, and inadequate number of viral copies(<138 copies/mL). A negative result must be combined with clinical observations, patient history, and epidemiological information. The expected result is Negative.  Fact Sheet for Patients:  BloggerCourse.com  Fact Sheet for Healthcare Providers:  SeriousBroker.it  This test is no t yet approved or cleared by the [place] FDA and  has been authorized for detection and/or diagnosis of SARS-CoV-2 by FDA under an Emergency Use  Authorization (EUA). This EUA will remain  in effect (meaning this test can be used) for the duration of the COVID-19 declaration under Section 564(b)(1) of the Act, 21 U.S.C.section 360bbb-3(b)(1), unless the authorization is terminated  or revoked sooner.       Influenza A by PCR NEGATIVE NEGATIVE Final   Influenza B by PCR POSITIVE (A) NEGATIVE Final    Comment: (NOTE) The Xpert Xpress SARS-CoV-2/FLU/RSV plus assay is intended as an aid in the diagnosis of influenza from Nasopharyngeal swab specimens and should not be used as a sole basis for treatment. Nasal washings and aspirates are unacceptable for Xpert Xpress SARS-CoV-2/FLU/RSV testing.  Fact Sheet for Patients: BloggerCourse.com  Fact Sheet for Healthcare Providers: SeriousBroker.it  This test is not yet approved or cleared by the [place] FDA and has been authorized for detection and/or diagnosis of SARS-CoV-2 by FDA under an Emergency Use Authorization (EUA). This EUA will remain in effect (meaning this test can be used) for the duration of the COVID-19 declaration under Section 564(b)(1) of the Act, 21 U.S.C. section 360bbb-3(b)(1), unless the authorization is terminated or revoked.     Resp Syncytial Virus by PCR NEGATIVE NEGATIVE Final    Comment: (NOTE) Fact Sheet for Patients: BloggerCourse.com  Fact Sheet for Healthcare Providers: SeriousBroker.it  This test is not yet approved or cleared by the [place] FDA and has been authorized for detection and/or diagnosis of SARS-CoV-2 by FDA under an Emergency Use Authorization (EUA). This EUA will remain in effect (meaning this test can be used) for the duration of the COVID-19 declaration under Section 564(b)(1) of the Act, 21 U.S.C. section 360bbb-3(b)(1), unless the authorization is terminated or revoked.  Performed at [facility],  [address] [place], [place] [address]      Radiology Studies: CT CHEST WO CONTRAST  Result Date: 11/25/2022 CLINICAL DATA:  Shortness of breath, pneumonia EXAM: CT CHEST WITHOUT CONTRAST TECHNIQUE: Multidetector CT imaging of the chest was performed following the standard protocol without IV contrast. RADIATION DOSE REDUCTION: This exam was performed according to the departmental dose-optimization program which includes automated exposure control, adjustment of the mA and/or kV according to patient size and/or use of iterative reconstruction technique. COMPARISON:  None Available. FINDINGS: Cardiovascular: Aortic atherosclerosis. Cardiomegaly. Right coronary artery calcifications. Enlargement of the main pulmonary artery measuring up to 3.9 cm in caliber. No pericardial effusion. Mediastinum/Nodes: Multiple calcified mediastinal and right hilar lymph nodes. Thyroid gland, trachea, and esophagus demonstrate no significant findings. Lungs/Pleura: Small right pleural effusion and associated atelectasis or consolidation. Extensive heterogeneous and  ground-glass airspace opacity as well as interlobular septal thickening, predominantly seen in the right upper lobe (series 7, image 50). Trace left pleural effusion with associated dependent atelectasis or consolidation. Upper Abdomen: No acute abnormality. Musculoskeletal: No chest wall abnormality. Multiple subacute appearing fractures of the lateral right ribs, involving at least the eighth and ninth ribs (series 7, image 105). IMPRESSION: 1. Extensive heterogeneous and ground-glass airspace opacity as well as interlobular septal thickening in the right lung, predominantly seen in the right upper lobe. Findings are consistent with multifocal infection and/or pulmonary edema. 2. Small right and trace left pleural effusions with associated atelectasis or consolidation. 3. Multiple calcified mediastinal and right hilar lymph nodes, consistent with sequelae of  prior granulomatous infection. There is most likely some underlying chronic scarring and architectural distortion of the right lung secondary to this process. 4. Cardiomegaly and coronary artery disease. 5. Enlargement of the main pulmonary artery, as can be seen in pulmonary hypertension. 6. Multiple subacute appearing fractures of the lateral right ribs, involving at least the eighth and ninth ribs. Aortic Atherosclerosis (ICD10-I70.0). Electronically Signed   By: Jearld Lesch M.D.   On: 11/25/2022 17:48    Scheduled Meds:  apixaban  5 mg Oral BID   chlorpheniramine-HYDROcodone  5 mL Oral Q12H   fluticasone furoate-vilanterol  1 puff Inhalation Daily   gabapentin  300 mg Oral Daily   And   gabapentin  600 mg Oral QHS   guaiFENesin  600 mg Oral BID   labetalol  200 mg Oral BID   methylPREDNISolone (SOLU-MEDROL) injection  80 mg Intravenous Q12H   mirtazapine  15 mg Oral QHS   oseltamivir  30 mg Oral BID   pantoprazole  40 mg Oral Daily   polyethylene glycol  17 g Oral Daily   pravastatin  20 mg Oral QHS   senna-docusate  1 tablet Oral BID   sodium chloride flush  3 mL Intravenous Q12H   umeclidinium bromide  1 puff Inhalation Daily   Continuous Infusions:  azithromycin 500 mg (11/26/22 2350)   cefTRIAXone (ROCEPHIN)  IV 2 g (11/26/22 2129)   diltiazem (CARDIZEM) infusion       LOS: 3 days   Burnadette Pop, MD [facility] P1/07/2023, 11:24 AM

## 2022-11-27 NOTE — Evaluation (Signed)
Clinical/Bedside Swallow Evaluation Patient Details  Name: [name] MRN: [id number] Date of Birth: 04/21/1933  Today's Date: 11/27/2022 Time: SLP Start Time (ACUTE ONLY): 31 SLP Stop Time (ACUTE ONLY): 1345 SLP Time Calculation (min) (ACUTE ONLY): 10 min  Past Medical History:  Past Medical History:  Diagnosis Date   Arthritis    Atrial fibrillation, transient ([place])    post op hip surgery; May 2021   Diverticulitis    Dyspnea    with exertion    GERD (gastroesophageal reflux disease)    Hyperlipidemia    Hypertension    Stroke ([place])    hx opf mini strokes    TIA (transient ischemic attack)    Past Surgical History:  Past Surgical History:  Procedure Laterality Date   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/13/2020   Procedure: CONVERSION TO LEFT TOTAL HIP-POSTERIOR APPROACH;  Surgeon: Paralee Cancel, MD;  Location: [facility];  Service: Orthopedics;  Laterality: Left;  90 mins   HIP FRACTURE SURGERY Left 04/16/2020   KNEE SURGERY     WRIST SURGERY     HPI:  66 year olf female admitted with acute respiratory failure, PNA, +flu, COPD exacerbation. Hx of COVID, COPD, HTN, HLD, arthritis).    Assessment / Plan / Recommendation  Clinical Impression  Pt and daughter denies current or past dysphagia (pt's daughter is an SLP who worked at [employer]). Pt has upper and lower partials, not donned as she stated she did not need to wear them with her sandwich at lunch. She states an occasional (once a year) globus sensation when eating but denies GERD. Her coordination of swallow and respirations was within normal limits. There were no indications of compromised airway and efficient and complete oral clearance with regular texture. Recommend she continue with regular, thin liquids. No further ST needed. SLP Visit Diagnosis: Dysphagia, unspecified (R13.10)    Aspiration Risk  No limitations    Diet Recommendation Regular;Thin liquid   Liquid Administration via: Straw;Cup Medication  Administration: Whole meds with liquid Supervision: Patient able to self feed Compensations: Slow rate;Small sips/bites Postural Changes: Seated upright at 90 degrees    Other  Recommendations Oral Care Recommendations: Oral care BID    Recommendations for follow up therapy are one component of a multi-disciplinary discharge planning process, led by the attending physician.  Recommendations may be updated based on patient status, additional functional criteria and insurance authorization.  Follow up Recommendations No SLP follow up      Assistance Recommended at Discharge    Functional Status Assessment Patient has not had a recent decline in their functional status  Frequency and Duration            Prognosis        Swallow Study   General Date of Onset: 11/24/22 HPI: 35 year olf female admitted with acute respiratory failure, PNA, +flu, COPD exacerbation. Hx of COVID, COPD, HTN, HLD, arthritis). Type of Study: Bedside Swallow Evaluation Previous Swallow Assessment:  (none) Diet Prior to this Study: Regular;Thin liquids Temperature Spikes Noted: No Respiratory Status: Nasal cannula History of Recent Intubation: No Behavior/Cognition: Alert;Pleasant mood;Cooperative Oral Cavity Assessment: Within Functional Limits Oral Care Completed by SLP: No Oral Cavity - Dentition: Other (Comment) (upper and lower partials) Vision: Functional for self-feeding Self-Feeding Abilities: Able to feed self Patient Positioning: Upright in bed Baseline Vocal Quality: Normal Volitional Cough: Strong;Congested Volitional Swallow: Able to elicit    Oral/Motor/Sensory Function Overall Oral Motor/Sensory Function: Within functional limits   Ice Chips Ice chips:  Not tested   Thin Liquid Thin Liquid: Within functional limits Presentation: Cup;Straw    Nectar Thick Nectar Thick Liquid: Not tested   Honey Thick Honey Thick Liquid: Not tested   Puree Puree: Within functional limits   Solid      Solid: Within functional limits      Royce Macadamia 11/27/2022,1:58 PM

## 2022-11-27 NOTE — Consult Note (Signed)
NAME:  [name], MRN:  [id number], DOB:  1933/05/21, LOS: 3 ADMISSION DATE:  11/24/2022, CONSULTATION DATE: 1/9 REFERRING MD:  Dr. Renford Dills, CHIEF COMPLAINT:  hypoxia   History of Present Illness:  89 year old female with past medical history as below, which is significant for atrial fibrillation on Eliquis/diltiazem, GERD, COPD, hypertension, and stroke.  She presented to her PCP on 1/2 with complaints of productive cough and wheezing for the previous 5 days.  She progressed to develop shortness of breath as well.  Workup at that time included a point-of-care flu test which was positive for flu B as well as chest x-ray demonstrating right upper lobe pneumonia.  The patient was provided with an injection of ceftriaxone as well as a course of oral cephalosporin and doxycycline.  However, her symptoms worsened including the development of rapid ventricular response with atrial fibrillation and rates in the 120s to 150s.  Discussed her to present to [facility] emergency department on 1/6. She was hypoxic in the ED to 88% on room air. This improved with supplemental oxygen. She was admitted to the hospitalist service who initiated treatment for pneumonia with CTX/azithromycin, COPD exacerbation with steroids/nebs, influenza B with tamiflu, and AF-RVR with diltiazem. Despite two days of therapy her symptoms have not improved and PCCM has been consulted.   Pertinent  Medical History   has a past medical history of Arthritis, Atrial fibrillation, transient (HCC), Diverticulitis, Dyspnea, GERD (gastroesophageal reflux disease), Hyperlipidemia, Hypertension, Stroke (HCC), and TIA (transient ischemic attack). Never smoker, but significant second hand smoke exposure.    Significant Hospital Events: Including procedures, antibiotic start and stop dates in addition to other pertinent events   1/6 admit for Flux, PNA, AF RVR 1/8 PCCM consult for persistent wheeze and oxygen requirement.   Interim History /  Subjective:    Objective   Blood pressure 122/77, pulse (!) 112, temperature 98.9 F (37.2 C), temperature source Oral, resp. rate (!) 21, height 5\' 4"  (1.626 m), weight 89.2 kg, SpO2 97 %.        Intake/Output Summary (Last 24 hours) at 11/27/2022 1117 Last data filed at 11/26/2022 1800 Gross per 24 hour  Intake --  Output 700 ml  Net -700 ml   Filed Weights   11/25/22 0500 11/26/22 0343 11/27/22 0500  Weight: 90.6 kg 87.3 kg 89.2 kg    Examination: General: overweight elderly female in NAD HENT: [place]/AT, PERRL, no JVD Lungs: Upper airway wheeze transmitted through all lung fields.  Cardiovascular: IRIR, tachycardic to the low 100s.  Abdomen: Soft, NT, ND Extremities: no acute deformity and no significant edema.  Neuro: Alert, oriented, non-focal  Resolved Hospital Problem list     Assessment & Plan:   Flu B Possible superimposed bacterial pneumonia. PCT undetectably low x 2.  Hypoxia - Supplemental O2 to keep sats > 92% - Agree with tamiflu - Agree with CTX/azithro course. S/p 1 week of outpatient abx.  - Mobility and incentive spirometry - Add flutter valve - Legionella urine test pending  COPD with acute exacerbation - change breo/incruse to nebs - budesonide, brovana, yupelri - Systemic steroids reasonable here and for CAP tx. Reduce dose.   Atrial fib, RVR: likely due to flu and increased respiratory demand.  Chronic HFpEF: does not appear volume up on exam.  - Cardiology consulting - Diltiazem, labetalol per home regimen - Eliquis - Does not seem to be a role for additional diuresis.     Best Practice (right click and "Reselect all SmartList Selections"  daily)   Per [facility]  Labs   CBC: Recent Labs  Lab 11/24/22 2117 11/25/22 0542 11/26/22 0534  WBC 11.1* 9.1 9.7  HGB 10.8* 10.6* 10.9*  HCT 33.2* 32.3* 34.8*  MCV 85.8 84.3 87.9  PLT 313 312 680    Basic Metabolic Panel: Recent Labs  Lab 11/24/22 2117 11/25/22 0548 11/26/22 0534  11/27/22 0507  NA 124* 124* 128* 131*  K 5.5* 4.9  4.9 4.8 5.0  CL 93* 92* 96* 98  CO2 22 19* 23 25  GLUCOSE 132* 138* 171* 158*  BUN 16 14 18  27*  CREATININE 0.75 0.62 0.70 0.88  CALCIUM 9.2 9.1 9.3 9.0  MG  --  1.3* 2.1  --    GFR: Estimated Creatinine Clearance: 48.7 mL/min (by C-G formula based on SCr of 0.88 mg/dL). Recent Labs  Lab 11/24/22 2117 11/25/22 0542 11/25/22 0548 11/26/22 0534  PROCALCITON  --   --  <0.10  --   WBC 11.1* 9.1  --  9.7    Liver Function Tests: No results for input(s): "AST", "ALT", "ALKPHOS", "BILITOT", "PROT", "ALBUMIN" in the last 168 hours. No results for input(s): "LIPASE", "AMYLASE" in the last 168 hours. No results for input(s): "AMMONIA" in the last 168 hours.  ABG No results found for: "PHART", "PCO2ART", "PO2ART", "HCO3", "TCO2", "ACIDBASEDEF", "O2SAT"   Coagulation Profile: No results for input(s): "INR", "PROTIME" in the last 168 hours.  Cardiac Enzymes: No results for input(s): "CKTOTAL", "CKMB", "CKMBINDEX", "TROPONINI" in the last 168 hours.  HbA1C: Hgb A1c MFr Bld  Date/Time Value Ref Range Status  09/13/2020 12:05 PM 5.8 (H) 4.8 - 5.6 % Final    Comment:    (NOTE)         Prediabetes: 5.7 - 6.4         Diabetes: >6.4         Glycemic control for adults with diabetes: <7.0   07/08/2020 11:30 AM 5.6 4.8 - 5.6 % Final    Comment:             Prediabetes: 5.7 - 6.4          Diabetes: >6.4          Glycemic control for adults with diabetes: <7.0     CBG: No results for input(s): "GLUCAP" in the last 168 hours.  Review of Systems:   Bolds are positive  Constitutional: weight loss, gain, night sweats, Fevers, chills, fatigue .  HEENT: headaches, Sore throat, sneezing, nasal congestion, post nasal drip, Difficulty swallowing, Tooth/dental problems, visual complaints visual changes, ear ache CV:  chest pain, radiates:,Orthopnea, PND, swelling in lower extremities, dizziness, palpitations, syncope.  GI  heartburn,  indigestion, abdominal pain, nausea, vomiting, diarrhea, change in bowel habits, loss of appetite, bloody stools.  Resp: cough, productive: , hemoptysis, dyspnea, chest pain, pleuritic.  Skin: rash or itching or icterus GU: dysuria, change in color of urine, urgency or frequency. flank pain, hematuria  MS: joint pain or swelling. decreased range of motion  Psych: change in mood or affect. depression or anxiety.  Neuro: difficulty with speech, weakness, numbness, ataxia    Past Medical History:  She,  has a past medical history of Arthritis, Atrial fibrillation, transient ([place]), Diverticulitis, Dyspnea, GERD (gastroesophageal reflux disease), Hyperlipidemia, Hypertension, Stroke ([place]), and TIA (transient ischemic attack).   Surgical History:   Past Surgical History:  Procedure Laterality Date   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/13/2020   Procedure: CONVERSION TO LEFT TOTAL HIP-POSTERIOR APPROACH;  Surgeon: Alvan Dame,  Molli Hazard, MD;  Location: [facility] ORS;  Service: Orthopedics;  Laterality: Left;  90 mins   HIP FRACTURE SURGERY Left 04/16/2020   KNEE SURGERY     WRIST SURGERY       Social History:   reports that she has never smoked. She has never used smokeless tobacco. She reports that she does not drink alcohol and does not use drugs.   Family History:  Her family history includes Diabetes in her mother.   Allergies Allergies  Allergen Reactions   Keflex [Cephalexin] Other (See Comments)    "makes pt feel really bad"     Home Medications  Prior to Admission medications   Medication Sig Start Date End Date Taking? Authorizing Provider  albuterol (VENTOLIN HFA) 108 (90 Base) MCG/ACT inhaler Inhale 2 puffs into the lungs every 4 (four) hours as needed for wheezing or shortness of breath. 06/08/21  Yes [provider]  amLODipine (NORVASC) 10 MG tablet Take 10 mg by mouth every evening.    Yes [provider]  Ascorbic Acid (VITAMIN C) 1000 MG tablet Take 1,000 mg by mouth  daily.   Yes [provider]  benazepril (LOTENSIN) 20 MG tablet Take 20 mg by mouth 2 (two) times daily.   Yes [provider]  cetirizine (ZYRTEC) 10 MG tablet Take 10 mg by mouth daily.    Yes [provider]  Cholecalciferol (VITAMIN D3) 125 MCG (5000 UT) TABS Take 5,000 Units by mouth daily.   Yes [provider]  cyanocobalamin 1000 MCG tablet Take 1 tablet by mouth every other day. Vitamin B12   Yes [provider]  diltiazem (CARDIZEM) 30 MG tablet Take 1-2 tablets (30-60 mg total) by mouth 4 (four) times daily as needed (for heart rates greater than 100 bpm during atrial fibrillation). 06/30/21  Yes Allred, Fayrene Fearing, MD  ELIQUIS 5 MG TABS tablet TAKE 1 TABLET BY MOUTH TWICE A DAY 07/16/22  Yes Allred, Fayrene Fearing, MD  furosemide (LASIX) 20 MG tablet Take 1 tablet (20 mg total) by mouth daily. 10/02/22 10/02/23 Yes Cipriano Bunker, MD  gabapentin (NEURONTIN) 300 MG capsule Take 300 mg by mouth See admin instructions. Take one capsule (300 mg) in the morning and two capsules (600 mg) in the evening.   Yes [provider]  ipratropium-albuterol (DUONEB) 0.5-2.5 (3) MG/3ML SOLN Take 3 mLs by nebulization every 6 (six) hours as needed (shortness of breath). 07/07/22  Yes [provider]  KLOR-CON M20 20 MEQ tablet Take 20 mEq by mouth daily with supper. 07/19/22  Yes [provider]  labetalol (NORMODYNE) 200 MG tablet Take 200 mg by mouth 2 (two) times daily.   Yes [provider]  methocarbamol (ROBAXIN) 750 MG tablet Take 750 mg by mouth 3 (three) times daily as needed for muscle spasms. 03/06/22  Yes [provider]  mirtazapine (REMERON) 15 MG tablet Take 15 mg by mouth at bedtime. 02/27/22  Yes [provider]  Multiple Vitamin (MULTIVITAMIN WITH MINERALS) TABS tablet Take 1 tablet by mouth daily.   Yes [provider]  Multiple Vitamins-Minerals (PRESERVISION AREDS 2 PO) Take 1 tablet by mouth daily.  [employer], Historical, MD  Omega-3 Fatty Acids (OMEGA-3 FISH OIL PO) Take 1 capsule by mouth daily.   Yes [provider]  pantoprazole (PROTONIX) 40 MG tablet Take 40 mg by mouth daily. 05/13/21  Yes [provider]  pravastatin (PRAVACHOL) 20 MG tablet Take 20 mg by mouth at bedtime.  Yes [provider]  spironolactone (ALDACTONE) 25 MG tablet Take 25 mg by mouth daily.    Yes [provider]  sucralfate (CARAFATE) 1 g tablet Take 1 g by mouth 4 (four) times daily -  with meals and at bedtime.   Yes [provider]  acetaminophen (TYLENOL) 500 MG tablet Take 2 tablets (1,000 mg total) by mouth every 8 (eight) hours. 09/16/20   Lanney Gins, PA-C  ferrous sulfate (FERROUSUL) 325 (65 FE) MG tablet Take 1 tablet (325 mg total) by mouth 3 (three) times daily with meals for 14 days. Patient taking differently: Take 325 mg by mouth 2 (two) times daily with a meal. 09/16/20 09/26/22  Lanney Gins, PA-C     Critical care time:       Joneen Roach, AGACNP-BC [facility]  See Amion for personal pager PCCM on call pager [phone] until 7pm. Please call Elink 7p-7a. [phone]  11/27/2022 11:28 AM

## 2022-11-27 NOTE — Plan of Care (Signed)
  Problem: Activity: Goal: Ability to tolerate increased activity will improve Outcome: Progressing Goal: Will verbalize the importance of balancing activity with adequate rest periods Outcome: Progressing   Problem: Respiratory: Goal: Ability to maintain a clear airway will improve Outcome: Progressing   Problem: Clinical Measurements: Goal: Ability to maintain a body temperature in the normal range will improve Outcome: Progressing

## 2022-11-27 NOTE — Evaluation (Signed)
Occupational Therapy Evaluation Patient Details Name: [name] MRN: [id number] DOB: 1933-07-23 Today's Date: 11/27/2022   History of Present Illness  (50 year olf female admitted with acute respiratory failure, PNA, +flu, COPD exacerbation. Hx of COVID, COPD, HTN, HLD, arthritis)   Clinical Impression   The patient is currently presenting below her baseline level of functioning for self-care management, as she is limited by deconditioning, generalized weakness, compromised endurance, and reports of chronic R knee discomfort due to arthritis. She will benefit from further OT services to maximize her safety and independence with self-care tasks & to decrease the risk for further weakness and deconditioning.       Recommendations for follow up therapy are one component of a multi-disciplinary discharge planning process, led by the attending physician.  Recommendations may be updated based on patient status, additional functional criteria and insurance authorization.   Follow Up Recommendations  Home health OT     Assistance Recommended at Discharge Intermittent Supervision/Assistance  Patient can return home with the following A little help with walking and/or transfers;Assistance with cooking/housework;Help with stairs or ramp for entrance;A little help with bathing/dressing/bathroom    Functional Status Assessment  Patient has had a recent decline in their functional status and demonstrates the ability to make significant improvements in function in a reasonable and predictable amount of time.  Equipment Recommendations  None recommended by OT       Precautions / Restrictions Precautions Precaution Comments: monitor HR, O2 Restrictions Weight Bearing Restrictions: No      Mobility Bed Mobility      General bed mobility comments: Pt was received seated in the bedside chair    Transfers Overall transfer level: Needs assistance Equipment used: Rolling walker (2  wheels) Transfers: Sit to/from Stand Sit to Stand: Min guard                  Balance     Sitting balance-Leahy Scale: Good         Standing balance comment: min guard to min assist with RW           ADL either performed or assessed with clinical judgement   ADL Overall ADL's : Needs assistance/impaired Eating/Feeding: Independent;Sitting   Grooming: Set up;Sitting           Upper Body Dressing : Minimal assistance;Sitting   Lower Body Dressing: Moderate assistance           Vision Baseline Vision/History: 1 Wears glasses              Pertinent Vitals/Pain Pain Assessment Pain Assessment: No/denies pain     Hand Dominance Right   Extremity/Trunk Assessment Upper Extremity Assessment Upper Extremity Assessment: Overall WFL for tasks assessed   Lower Extremity Assessment Lower Extremity Assessment: Generalized weakness       Communication     Cognition Arousal/Alertness: Awake/alert Behavior During Therapy: WFL for tasks assessed/performed Overall Cognitive Status: Within Functional Limits for tasks assessed                 Home Living Family/patient expects to be discharged to:: Private residence Living Arrangements: Children Available Help at Discharge: Family;Available PRN/intermittently Type of Home: House Home Access: Stairs to enter Entrance Stairs-Number of Steps: 1   Home Layout: Able to live on main level with bedroom/bathroom      Home Equipment: Rollator (4 wheels)          Prior Functioning/Environment Prior Level of Function : Independent/Modified Independent  Mobility Comments: uses rollator. ADLs Comments: receives help in/out of shower from daughter        OT Problem List: Decreased strength;Decreased activity tolerance;Impaired balance (sitting and/or standing);Decreased knowledge of use of DME or AE      OT Treatment/Interventions: Self-care/ADL training;Therapeutic  exercise;Therapeutic activities;Energy conservation;DME and/or AE instruction;Patient/family education;Balance training    OT Goals(Current goals can be found in the care plan section) Acute Rehab OT Goals Patient Stated Goal: to get better OT Goal Formulation: With patient Time For Goal Achievement: 12/11/22 Potential to Achieve Goals: Good ADL Goals Pt Will Perform Grooming: standing Pt Will Perform Lower Body Dressing: with supervision;sit to/from stand Pt Will Transfer to Toilet: with supervision;ambulating Pt Will Perform Toileting - Clothing Manipulation and hygiene: with supervision;sit to/from stand Pt/caregiver will Perform Home Exercise Program: Increased strength;Both right and left upper extremity;With theraband;With Supervision  OT Frequency: Min 2X/week       AM-PAC OT "6 Clicks" Daily Activity     Outcome Measure Help from another person eating meals?: None Help from another person taking care of personal grooming?: A Little Help from another person toileting, which includes using toliet, bedpan, or urinal?: A Little Help from another person bathing (including washing, rinsing, drying)?: A Lot Help from another person to put on and taking off regular upper body clothing?: A Little Help from another person to put on and taking off regular lower body clothing?: A Lot 6 Click Score: 17   End of Session Equipment Utilized During Treatment: Gait belt;Rolling walker (2 wheels) Nurse Communication: Mobility status  Activity Tolerance: Patient limited by fatigue Patient left: in chair;with call bell/phone within reach;with chair alarm set  OT Visit Diagnosis: Unsteadiness on feet (R26.81);Muscle weakness (generalized) (M62.81)                Time: 4481-8563 OT Time Calculation (min): 15 min Charges:  OT General Charges $OT Visit: 1 Visit OT Evaluation $OT Eval Moderate Complexity: 1 Mod    Hether Anselmo L Baylin Cabal, OTR/L 11/27/2022, 1:10 PM

## 2022-11-27 NOTE — Consult Note (Signed)
Cardiology Consultation   Patient ID: [name] MRN: [id number]; DOB: [birth year]  Admit date: 11/24/2022 Date of Consult: 11/27/2022  PCP:  Hadley Pen, MD   [facility] Providers Cardiologist:  Hillis Range, MD   {   Patient Profile:   [name] is a 89 y.o. female with a history of paroxysmal atrial fibrillation on Eliquis, chronic diastolic CHF, hypertension, hyperlipidemia, TIAs, COPD, GERD, and diverticulitis who is being seen 11/27/2022 for the evaluation of atrial fibrillation with RVR in the setting of COPD exacerbation and influenza B at the request of Dr. Renford Dills.  History of Present Illness:   [name] is a 89 year old female with the above history who was previously followed by Dr. Johney Frame. She has a history of paroxysmal atrial fibrillation and has been maintained on Labetalol and Eliquis as well as PRN Diltiazem. She also has diastolic CHF. Patient was last seen by Dr. Johney Frame in 03/2022 at which time patient was doing well with no cardiac complaints. She was admitted in 09/2022 for acute hypoxic respiratory failure secondary to COVID pneumonia. She also received a couple of dose of IV Lasix that admission. Echo at that time showed LVEF of 60-65% with no regional wall motion abnormalities, normal RV, left atrial enlargement, and moderately elevated PASP.  Patient presented to [facility] ED on 11/24/2022 for further evaluation of shortness of breath and recurrent atrial fibrillation after being diagnosed with the influenza B and pneumonia on 11/20/2022. Upon arrival to the ED, patient was mildly tachycardia, tachypneic with respiratory rate of 30, and hypoxic with O2 sat of 88%. She was placed on 2L of O2 via nasal cannula with improvement in O2 sat. EKG showed atrial fibrillation, rate 106 bpm, with no acute ischemic changes. WBC 11.1, Hgb 10.8, Plts 313. Na 124, K 5.5, Glucose 132, BUN 16, Cr 0.75. BNP 390. Chest x-ray consistent with COPD with moderate  severity of right upper lobe infiltrate as well as mild bibasilar atelectasis and/or infiltrates and small right pleural effusion. Respiratory panel was positive for influenza B but negative for COVID and influenza A.  She was admitted with acute hypoxic respiratory failure secondary to COPD exacerbation and pneumonia and was treated with antibiotics, steroids, nebulizers, and supplemental O2. She was also started on IV Diltiazem for her rapid atrial fibrillation. Cardiology was consulted to assist with rapid atrial fibrillation.  At the time of this evaluation, patient is resting comfortably in no acute distress. She states she feels like she is breathing much better but still has a productive cough. She developed sudden onset of weakness, shortness of breath, and cough over New Years Weekend. She went to PCP office on 11/20/2022 and was diagnosed with pneumonia and influenza B. She was started on antibiotics but shortness of breath and cough worsened which is why she decided to come to the ED. She was even having shortness of breath at rest and describes trouble breathing laying down flat and waking up feeling short of breath. However, no worsening edema from baseline. It sounds like this was due to COPD exacerbation and pneumonia rather than true orthopnea/PND from CHF. She denies any of these symptoms prior to current illness. She denies any chest pain. She does describe some palpitations and irregular heart breath since being diagnosed with pneumonia and influenza. She also reports some intermittent dizziness with position changes but no syncope. She had body aches and chills initially and a subjective fever but this has resolved. No abnormal bleeding in  urine or stools.  Patient's son-in-law is a physician so will periodically check her vitals and use Kardia mobile device. She will occasionally have mild episodes of paradoxical atrial fibrillation and will take a dose of PRN Diltiazem with improvement.    Past Medical History:  Diagnosis Date   Arthritis    Atrial fibrillation, transient (HCC)    post op hip surgery; May 2021   Diverticulitis    Dyspnea    with exertion    GERD (gastroesophageal reflux disease)    Hyperlipidemia    Hypertension    Stroke (HCC)    hx opf mini strokes    TIA (transient ischemic attack)     Past Surgical History:  Procedure Laterality Date   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/13/2020   Procedure: CONVERSION TO LEFT TOTAL HIP-POSTERIOR APPROACH;  Surgeon: Durene Romans, MD;  Location: [facility];  Service: Orthopedics;  Laterality: Left;  90 mins   HIP FRACTURE SURGERY Left 04/16/2020   KNEE SURGERY     WRIST SURGERY       Home Medications:  Prior to Admission medications   Medication Sig Start Date End Date Taking? Authorizing Provider  albuterol (VENTOLIN HFA) 108 (90 Base) MCG/ACT inhaler Inhale 2 puffs into the lungs every 4 (four) hours as needed for wheezing or shortness of breath. 06/08/21  Yes [provider]  amLODipine (NORVASC) 10 MG tablet Take 10 mg by mouth every evening.    Yes [provider]  Ascorbic Acid (VITAMIN C) 1000 MG tablet Take 1,000 mg by mouth daily.   Yes [provider]  benazepril (LOTENSIN) 20 MG tablet Take 20 mg by mouth 2 (two) times daily.   Yes [provider]  cetirizine (ZYRTEC) 10 MG tablet Take 10 mg by mouth daily.    Yes [provider]  Cholecalciferol (VITAMIN D3) 125 MCG (5000 UT) TABS Take 5,000 Units by mouth daily.   Yes [provider]  cyanocobalamin 1000 MCG tablet Take 1 tablet by mouth every other day. Vitamin B12   Yes [provider]  diltiazem (CARDIZEM) 30 MG tablet Take 1-2 tablets (30-60 mg total) by mouth 4 (four) times daily as needed (for heart rates greater than 100 bpm during atrial fibrillation). 06/30/21  Yes Allred, Fayrene Fearing, MD  ELIQUIS 5 MG TABS tablet TAKE 1 TABLET BY MOUTH TWICE A DAY 07/16/22  Yes Allred, Fayrene Fearing, MD   furosemide (LASIX) 20 MG tablet Take 1 tablet (20 mg total) by mouth daily. 10/02/22 10/02/23 Yes Cipriano Bunker, MD  gabapentin (NEURONTIN) 300 MG capsule Take 300 mg by mouth See admin instructions. Take one capsule (300 mg) in the morning and two capsules (600 mg) in the evening.   Yes [provider]  ipratropium-albuterol (DUONEB) 0.5-2.5 (3) MG/3ML SOLN Take 3 mLs by nebulization every 6 (six) hours as needed (shortness of breath). 07/07/22  Yes [provider]  KLOR-CON M20 20 MEQ tablet Take 20 mEq by mouth daily with supper. 07/19/22  Yes [provider]  labetalol (NORMODYNE) 200 MG tablet Take 200 mg by mouth 2 (two) times daily.   Yes [provider]  methocarbamol (ROBAXIN) 750 MG tablet Take 750 mg by mouth 3 (three) times daily as needed for muscle spasms. 03/06/22  Yes [provider]  mirtazapine (REMERON) 15 MG tablet Take 15 mg by mouth at bedtime. 02/27/22  Yes [provider]  Multiple Vitamin (MULTIVITAMIN WITH MINERALS) TABS tablet Take 1 tablet by mouth daily.   Yes  [provider]  Multiple Vitamins-Minerals (PRESERVISION AREDS 2 PO) Take 1 tablet by mouth daily. [employer], Historical, MD  Omega-3 Fatty Acids (OMEGA-3 FISH OIL PO) Take 1 capsule by mouth daily.   Yes [provider]  pantoprazole (PROTONIX) 40 MG tablet Take 40 mg by mouth daily. 05/13/21  Yes [provider]  pravastatin (PRAVACHOL) 20 MG tablet Take 20 mg by mouth at bedtime.   Yes [provider]  spironolactone (ALDACTONE) 25 MG tablet Take 25 mg by mouth daily.    Yes [provider]  sucralfate (CARAFATE) 1 g tablet Take 1 g by mouth 4 (four) times daily -  with meals and at bedtime.   Yes [provider]  acetaminophen (TYLENOL) 500 MG tablet Take 2 tablets (1,000 mg total) by mouth every 8 (eight) hours. 09/16/20   Danae Orleans, PA-C  ferrous sulfate (FERROUSUL) 325 (65 FE) MG  tablet Take 1 tablet (325 mg total) by mouth 3 (three) times daily with meals for 14 days. Patient taking differently: Take 325 mg by mouth 2 (two) times daily with a meal. 09/16/20 09/26/22  Danae Orleans, PA-C    Inpatient Medications: Scheduled Meds:  apixaban  5 mg Oral BID   chlorpheniramine-HYDROcodone  5 mL Oral Q12H   diltiazem  30 mg Oral Q6H   fluticasone furoate-vilanterol  1 puff Inhalation Daily   gabapentin  300 mg Oral Daily   And   gabapentin  600 mg Oral QHS   guaiFENesin  600 mg Oral BID   labetalol  200 mg Oral BID   methylPREDNISolone (SOLU-MEDROL) injection  80 mg Intravenous Q12H   mirtazapine  15 mg Oral QHS   oseltamivir  30 mg Oral BID   pantoprazole  40 mg Oral Daily   polyethylene glycol  17 g Oral Daily   pravastatin  20 mg Oral QHS   senna-docusate  1 tablet Oral BID   sodium chloride flush  3 mL Intravenous Q12H   umeclidinium bromide  1 puff Inhalation Daily   Continuous Infusions:  azithromycin 500 mg (11/26/22 2350)   cefTRIAXone (ROCEPHIN)  IV 2 g (11/26/22 2129)   PRN Meds: acetaminophen **OR** acetaminophen, ondansetron **OR** ondansetron (ZOFRAN) IV  Allergies:    Allergies  Allergen Reactions   Keflex [Cephalexin] Other (See Comments)    "makes pt feel really bad"    Social History:   Social History   Socioeconomic History   Marital status: Widowed    Spouse name: Not on file   Number of children: Not on file   Years of education: Not on file   Highest education level: Not on file  Occupational History   Not on file  Tobacco Use   Smoking status: Never   Smokeless tobacco: Never  Substance and Sexual Activity   Alcohol use: Never   Drug use: Never   Sexual activity: Not on file  Other Topics Concern   Not on file  Social History Narrative   Lives in [place] but currently staying with daughter in May.   homemaker   Social Determinants of Health   Financial Resource Strain: Not on file  Food Insecurity: No  [place] (11/25/2022)   Hunger Vital Sign    Worried About Running Out of Food in the Last Year: Never true    Ran Out of Food in the Last Year: Never true  Transportation Needs: No Transportation Needs (11/25/2022)   PRAPARE - Transportation    Lack  of Transportation (Medical): No    Lack of Transportation (Non-Medical): No  Physical Activity: Not on file  Stress: Not on file  Social Connections: Not on file  Intimate Partner Violence: Not At Risk (11/25/2022)   Humiliation, Afraid, Rape, and Kick questionnaire    Fear of Current or Ex-Partner: No    Emotionally Abused: No    Physically Abused: No    Sexually Abused: No    Family History:   Family History  Problem Relation Age of Onset   Diabetes Mother      ROS:  Please see the history of present illness.  Review of Systems  Constitutional:  Positive for chills, fever and malaise/fatigue.  HENT:  Negative for congestion.   Respiratory:  Positive for cough, sputum production and shortness of breath.   Cardiovascular:  Positive for palpitations. Negative for chest pain.  Gastrointestinal:  Negative for blood in stool and melena.  Genitourinary:  Negative for hematuria.  Musculoskeletal:  Positive for myalgias.  Neurological:  Positive for dizziness. Negative for loss of consciousness.  Endo/Heme/Allergies:  Does not bruise/bleed easily.  Psychiatric/Behavioral:  Negative for substance abuse.    Physical Exam/Data:   Vitals:   11/27/22 0457 11/27/22 0500 11/27/22 0836 11/27/22 0911  BP: (!) 135/94  122/77   Pulse:   (!) 112   Resp: (!) 21     Temp: 98.9 F (37.2 C)     TempSrc: Oral     SpO2: 99%   97%  Weight:  89.2 kg    Height:        Intake/Output Summary (Last 24 hours) at 11/27/2022 1123 Last data filed at 11/26/2022 1800 Gross per 24 hour  Intake --  Output 700 ml  Net -700 ml      11/27/2022    5:00 AM 11/26/2022    3:43 AM 11/25/2022    5:00 AM  Last 3 Weights  Weight (lbs) 196 lb 10.4 oz 192 lb 7.4 oz  199 lb 11.8 oz  Weight (kg) 89.2 kg 87.3 kg 90.6 kg     Body mass index is 33.76 kg/m.  General: 89 y.o. Caucasian female resting comfortably in no acute distress. HEENT: Normocephalic and atraumatic. Sclera clear.  Neck: Supple.  No JVD. Heart: Tachycardic with irregularly irregular rhythm. No murmurs, gallops, or rubs. Radial pulses 2+ and equal bilaterally. Lungs: Mild increased work of breathing. Mildly decrease breath sounds but no significant wheezes, rhonchi, or rales.  Abdomen: Soft, non-distended, and non-tender to palpation.  Extremities: No lower extremity edema.    Skin: Warm and dry. Neuro: Alert and oriented x3. No focal deficits. Psych: Normal affect. Responds appropriately.   EKG:  The EKG was personally reviewed and demonstrates:  Atrial fibrillation, rate 106 bpm, with no acute ischemic changes.  Telemetry:  Telemetry was personally reviewed and demonstrates:  Atrial fibrillation with rates mostly in the 90s to 110s (occasionally in the 120s).  Relevant CV Studies:  Echocardiogram 09/27/2022: Impressions:  1. Left ventricular ejection fraction, by estimation, is 60 to 65%. The  left ventricle has normal function. The left ventricle has no regional  wall motion abnormalities. Left ventricular diastolic parameters were  normal.   2. Right ventricular systolic function is normal. The right ventricular  size is normal. There is moderately elevated pulmonary artery systolic  pressure.   3. Left atrial size was moderately dilated.   4. The mitral valve is normal in structure. Trivial mitral valve  regurgitation. No evidence of mitral stenosis. Severe mitral  annular  calcification.   5. The aortic valve is tricuspid. There is moderate calcification of the  aortic valve. Aortic valve regurgitation is not visualized. Aortic valve  sclerosis/calcification is present, without any evidence of aortic  stenosis.   6. The inferior vena cava is normal in size with greater than  50%  respiratory variability, suggesting right atrial pressure of 3 mmHg.    Laboratory Data:  High Sensitivity Troponin:  No results for input(s): "TROPONINIHS" in the last 720 hours.   Chemistry Recent Labs  Lab 11/25/22 0548 11/26/22 0534 11/27/22 0507  NA 124* 128* 131*  K 4.9  4.9 4.8 5.0  CL 92* 96* 98  CO2 19* 23 25  GLUCOSE 138* 171* 158*  BUN 14 18 27*  CREATININE 0.62 0.70 0.88  CALCIUM 9.1 9.3 9.0  MG 1.3* 2.1  --   GFRNONAA >60 >60 >60  ANIONGAP 13 9 8     No results for input(s): "PROT", "ALBUMIN", "AST", "ALT", "ALKPHOS", "BILITOT" in the last 168 hours. Lipids No results for input(s): "CHOL", "TRIG", "HDL", "LABVLDL", "LDLCALC", "CHOLHDL" in the last 168 hours.  Hematology Recent Labs  Lab 11/24/22 2117 11/25/22 0542 11/26/22 0534  WBC 11.1* 9.1 9.7  RBC 3.87 3.83* 3.96  HGB 10.8* 10.6* 10.9*  HCT 33.2* 32.3* 34.8*  MCV 85.8 84.3 87.9  MCH 27.9 27.7 27.5  MCHC 32.5 32.8 31.3  RDW 14.2 14.0 13.9  PLT 313 312 341   Thyroid  Recent Labs  Lab 11/25/22 0548  TSH 0.666    BNP Recent Labs  Lab 11/24/22 2117  BNP 390.2*    DDimer No results for input(s): "DDIMER" in the last 168 hours.   Radiology/Studies:  CT CHEST WO CONTRAST  Result Date: 11/25/2022 CLINICAL DATA:  Shortness of breath, pneumonia EXAM: CT CHEST WITHOUT CONTRAST TECHNIQUE: Multidetector CT imaging of the chest was performed following the standard protocol without IV contrast. RADIATION DOSE REDUCTION: This exam was performed according to the departmental dose-optimization program which includes automated exposure control, adjustment of the mA and/or kV according to patient size and/or use of iterative reconstruction technique. COMPARISON:  None Available. FINDINGS: Cardiovascular: Aortic atherosclerosis. Cardiomegaly. Right coronary artery calcifications. Enlargement of the main pulmonary artery measuring up to 3.9 cm in caliber. No pericardial effusion. Mediastinum/Nodes: Multiple  calcified mediastinal and right hilar lymph nodes. Thyroid gland, trachea, and esophagus demonstrate no significant findings. Lungs/Pleura: Small right pleural effusion and associated atelectasis or consolidation. Extensive heterogeneous and ground-glass airspace opacity as well as interlobular septal thickening, predominantly seen in the right upper lobe (series 7, image 50). Trace left pleural effusion with associated dependent atelectasis or consolidation. Upper Abdomen: No acute abnormality. Musculoskeletal: No chest wall abnormality. Multiple subacute appearing fractures of the lateral right ribs, involving at least the eighth and ninth ribs (series 7, image 105). IMPRESSION: 1. Extensive heterogeneous and ground-glass airspace opacity as well as interlobular septal thickening in the right lung, predominantly seen in the right upper lobe. Findings are consistent with multifocal infection and/or pulmonary edema. 2. Small right and trace left pleural effusions with associated atelectasis or consolidation. 3. Multiple calcified mediastinal and right hilar lymph nodes, consistent with sequelae of prior granulomatous infection. There is most likely some underlying chronic scarring and architectural distortion of the right lung secondary to this process. 4. Cardiomegaly and coronary artery disease. 5. Enlargement of the main pulmonary artery, as can be seen in pulmonary hypertension. 6. Multiple subacute appearing fractures of the lateral right ribs, involving at least the  eighth and ninth ribs. Aortic Atherosclerosis (ICD10-I70.0). Electronically Signed   By: Jearld Lesch M.D.   On: 11/25/2022 17:48   DG Chest 2 View  Result Date: 11/24/2022 CLINICAL DATA:  Shortness of breath. EXAM: CHEST - 2 VIEW COMPARISON:  November 20, 2022 FINDINGS: The heart size and mediastinal contours are within normal limits. The lungs are hyperinflated with mild right-sided volume loss noted. Chronic appearing increased interstitial  lung markings are seen. Moderate severity right upper lobe infiltrate is also present. This is increased in severity when compared to the prior study. Mild areas of atelectasis and/or infiltrate are also seen within the bilateral lung bases. There is a small right pleural effusion. No pneumothorax is identified. The visualized skeletal structures are unremarkable. IMPRESSION: 1. COPD with moderate severity right upper lobe infiltrate. 2. Mild bibasilar atelectasis and/or infiltrate. 3. Small right pleural effusion. Electronically Signed   By: Aram Candela M.D.   On: 11/24/2022 22:46     Assessment and Plan:   Paroxysmal Atrial Fibrillation Patient has a history of paroxysmal atrial fibrillation. She was admitted with acute hypoxic respiratory failure secondary to COPD exacerbation, pneumonia, and influenza B and was noted in rapid atrial fibrillation. Recent Echo in 09/2022 showed normal LV function. She was initially started on IV Diltiazem and then transitioned to PO Cardizem but rates have remained elevated.  - Currently in atrial fibrillation with rates mostly in the 90s to 110s but occasionally in the 120s. Daughter states this is improved from the 130s to 140s yesterday. - Currently on home Labetolol 200mg  twice daily and Diltiazem 30mg  every 6 hours. Will continue Labetolol. Will stop PO Cardizem for now and put her back on IV Diltiazem while she continues to be treated for COPD exacerbation and pneumonia. However, hopefully will be able to wean this off in 1-2 day.  - Continue chronic anticoagulation with Eliquis 5mg  twice daily. - Suspect this will improve with treatment of underlying illnesses. If she is still in atrial fibrillation at follow-up visit after she has recovered from respiratory illness, can consider outpatient DCCV.  Chronic Diastolic CHF BNP mildly elevated in the 300s. Chest x-ray consistent with COPD and pneumonia. Recent Echo in 09/2022 showed LVEF of 60-65% with no  regional wall motion abnormalities, normal RV, left atrial enlargement, and moderately elevated PASP. She received 1 dose of IV Lasix in the ED. - Does not appear significantly volume overloaded on exam. - No additional diuresis necessary at this time.  Hypertension BP well controlled. - Continue rate control agents as above.  Hyperlipidemia - Continue Pravastatin 20mg  daily.  Otherwise, per primary team: - Acute hypoxic respiratory failure - COPD exacerbation - Right upper lobe pneumonia - Influenza B - Type 2 diabetes mellitus   Risk Assessment/Risk Scores:    New York Heart Association (NYHA) Functional Class NYHA Class III. This was primarily due to underlying respiratory illness (COPD exacerbation and pneumonia)  CHA2DS2-VASc Score = 6  This indicates a 9.7% annual risk of stroke. The patient's score is based upon: CHF History: 1 HTN History: 1 Diabetes History: 1 Stroke History: 0 Vascular Disease History: 0 Age Score: 2 Gender Score: 1   For questions or updates, please contact [facility] Please consult [email or web address] for contact info under    Signed, , PA-C  11/27/2022 11:23 AM

## 2022-11-28 DIAGNOSIS — J9601 Acute respiratory failure with hypoxia: Secondary | ICD-10-CM | POA: Diagnosis not present

## 2022-11-28 LAB — CBC
HCT: 33.7 % — ABNORMAL LOW (ref 36.0–46.0)
Hemoglobin: 10.8 g/dL — ABNORMAL LOW (ref 12.0–15.0)
MCH: 28.1 pg (ref 26.0–34.0)
MCHC: 32 g/dL (ref 30.0–36.0)
MCV: 87.8 fL (ref 80.0–100.0)
Platelets: 417 10*3/uL — ABNORMAL HIGH (ref 150–400)
RBC: 3.84 MIL/uL — ABNORMAL LOW (ref 3.87–5.11)
RDW: 14.1 % (ref 11.5–15.5)
WBC: 14.9 10*3/uL — ABNORMAL HIGH (ref 4.0–10.5)
nRBC: 0 % (ref 0.0–0.2)

## 2022-11-28 LAB — BASIC METABOLIC PANEL
Anion gap: 6 (ref 5–15)
BUN: 32 mg/dL — ABNORMAL HIGH (ref 8–23)
CO2: 28 mmol/L (ref 22–32)
Calcium: 8.9 mg/dL (ref 8.9–10.3)
Chloride: 98 mmol/L (ref 98–111)
Creatinine, Ser: 0.77 mg/dL (ref 0.44–1.00)
GFR, Estimated: >60 mL/min (ref >60)
Glucose, Bld: 141 mg/dL — ABNORMAL HIGH (ref 70–99)
Potassium: 5.3 mmol/L — ABNORMAL HIGH (ref 3.5–5.1)
Sodium: 132 mmol/L — ABNORMAL LOW (ref 135–145)

## 2022-11-28 MED ORDER — BISACODYL 10 MG RE SUPP
10.0000 mg | Freq: Once | RECTAL | Status: AC
  Administered 2022-11-28: 10 mg via RECTAL
  Filled 2022-11-28: qty 1

## 2022-11-28 MED ORDER — SODIUM ZIRCONIUM CYCLOSILICATE 10 G PO PACK
10.0000 g | PACK | Freq: Once | ORAL | Status: AC
  Administered 2022-11-28: 10 g via ORAL
  Filled 2022-11-28: qty 1

## 2022-11-28 NOTE — TOC Transition Note (Signed)
Transition of Care [facility]) - CM/SW Discharge Note   Patient Details  Name: [name] MRN: [id number] Date of Birth: Mar 23, 1933  Transition of Care [facility]) CM/SW Contact:  Dessa Phi, RN Phone Number: 11/28/2022, 12:45 PM   Clinical Narrative: No preference for [place] per Pat(dtr) [facility] rep Claiborne Billings aware-HHPT/OT Monitor on 02 if needed.      Final next level of care: Home w Home Health Services Barriers to Discharge: No Barriers Identified   Patient Goals and CMS Choice CMS Medicare.gov Compare Post Acute Care list provided to:: Patient Represenative (must comment) Choice offered to / list presented to : Adult Children  Discharge Placement                         Discharge Plan and Services Additional resources added to the After Visit Summary for     Discharge Planning Services: CM Consult Post Acute Care Choice: Home Health                    HH Arranged: PT, OT [facility] Agency: [place] Date [place]: 11/28/22 Time [place]: 1244 Representative spoke with at [place]: kelly  Social Determinants of Health ([place]) Interventions SDOH Screenings   Food Insecurity: No Food Insecurity (11/25/2022)  Housing: Low Risk  (11/25/2022)  Transportation Needs: No Transportation Needs (11/25/2022)  Utilities: Not At Risk (11/25/2022)  Tobacco Use: Low Risk  (11/24/2022)     Readmission Risk Interventions     No data to display

## 2022-11-28 NOTE — Plan of Care (Signed)
  Problem: Education: Goal: Knowledge of disease or condition will improve Outcome: Progressing Goal: Knowledge of the prescribed therapeutic regimen will improve Outcome: Progressing Goal: Individualized Educational Video(s) Outcome: Progressing   Problem: Activity: Goal: Ability to tolerate increased activity will improve Outcome: Progressing Goal: Will verbalize the importance of balancing activity with adequate rest periods Outcome: Progressing   Problem: Respiratory: Goal: Ability to maintain a clear airway will improve Outcome: Progressing Goal: Levels of oxygenation will improve Outcome: Progressing Goal: Ability to maintain adequate ventilation will improve Outcome: Progressing   Problem: Activity: Goal: Ability to tolerate increased activity will improve Outcome: Progressing   Problem: Clinical Measurements: Goal: Ability to maintain a body temperature in the normal range will improve Outcome: Progressing   Problem: Respiratory: Goal: Ability to maintain adequate ventilation will improve Outcome: Progressing Goal: Ability to maintain a clear airway will improve Outcome: Progressing   Problem: Education: Goal: Knowledge of disease or condition will improve Outcome: Progressing Goal: Understanding of medication regimen will improve Outcome: Progressing Goal: Individualized Educational Video(s) Outcome: Progressing   Problem: Activity: Goal: Ability to tolerate increased activity will improve Outcome: Progressing   Problem: Cardiac: Goal: Ability to achieve and maintain adequate cardiopulmonary perfusion will improve Outcome: Progressing   Problem: Health Behavior/Discharge Planning: Goal: Ability to safely manage health-related needs after discharge will improve Outcome: Progressing   Problem: Education: Goal: Knowledge of General Education information will improve Description: Including pain rating scale, medication(s)/side effects and  non-pharmacologic comfort measures Outcome: Progressing   Problem: Health Behavior/Discharge Planning: Goal: Ability to manage health-related needs will improve Outcome: Progressing   Problem: Clinical Measurements: Goal: Ability to maintain clinical measurements within normal limits will improve Outcome: Progressing Goal: Will remain free from infection Outcome: Progressing Goal: Diagnostic test results will improve Outcome: Progressing Goal: Respiratory complications will improve Outcome: Progressing Goal: Cardiovascular complication will be avoided Outcome: Progressing   Problem: Activity: Goal: Risk for activity intolerance will decrease Outcome: Progressing   Problem: Nutrition: Goal: Adequate nutrition will be maintained Outcome: Progressing   Problem: Coping: Goal: Level of anxiety will decrease Outcome: Progressing   Problem: Elimination: Goal: Will not experience complications related to bowel motility Outcome: Progressing Goal: Will not experience complications related to urinary retention Outcome: Progressing   Problem: Pain Managment: Goal: General experience of comfort will improve Outcome: Progressing   Problem: Safety: Goal: Ability to remain free from injury will improve Outcome: Progressing   Problem: Skin Integrity: Goal: Risk for impaired skin integrity will decrease Outcome: Progressing

## 2022-11-28 NOTE — Progress Notes (Signed)
PT Cancellation Note  Patient Details Name: [name] MRN: [id number] DOB: 07/30/1933   Cancelled Treatment:    Reason Eval/Treat Not Completed: Other (comment). Arrived at pt's bedside and she reports she got up earlier today and got "dizzy headed", reports she is tired and worn out, politely declines therapy. Will continue efforts.    Talbot Grumbling PT, DPT 11/28/22, 2:34 PM

## 2022-11-28 NOTE — Progress Notes (Signed)
Occupational Therapy Treatment Patient Details Name: [name] MRN: [id number] DOB: 14-Jan-1933 Today's Date: 11/28/2022   History of present illness 89 yr old female admitted with acute respiratory failure, flu, and COPD exacerbation. PMH: COVID, COPD, HTN, HLD, arthritis   OT comments  Patient presented with feelings of dizziness and slight nausea during progressive out of bed activity. She remains on 4L O2 via nasal cannula and her heart rate increased up to 130 bpm during activity. She required intermittent therapeutic rest breaks and cues to implement pursed lip breathing, given shortness of breath with activity and quick fatigue. Continue OT plan of care    Recommendations for follow up therapy are one component of a multi-disciplinary discharge planning process, led by the attending physician.  Recommendations may be updated based on patient status, additional functional criteria and insurance authorization.    Follow Up Recommendations  Home health OT     Assistance Recommended at Discharge Intermittent Supervision/Assistance  Patient can return home with the following  A little help with walking and/or transfers;Assistance with cooking/housework;Help with stairs or ramp for entrance;A little help with bathing/dressing/bathroom   Equipment Recommendations  None recommended by OT       Precautions / Restrictions Precautions Precaution Comments: monitor heart rate and O2 saturation Restrictions Weight Bearing Restrictions: No       Mobility Bed Mobility Overal bed mobility: Needs Assistance Bed Mobility: Supine to Sit, Sit to Supine     Supine to sit: Min guard, HOB elevated Sit to supine: Min guard, HOB elevated        Transfers Overall transfer level: Needs assistance Equipment used: Rolling walker (2 wheels) Transfers: Sit to/from Stand Sit to Stand: Min guard, From elevated surface           General transfer comment: required assist for steadying and  for full trunk extension once standing         ADL either performed or assessed with clinical judgement   ADL Overall ADL's : Needs assistance/impaired Eating/Feeding: Independent;Sitting   Grooming: Min guard Grooming Details (indicate cue type and reason): She required steadying assist standing at sink level, in order to perform face and hand washing. She was instructed on implementing deep breathing exercises and appropriate pacing, given shortness of breath with activity and quick fatigue.             Lower Body Dressing: Moderate assistance Lower Body Dressing Details (indicate cue type and reason): She demonstrated the crossed leg technique to donn her socks with supervision seated EOB; she required increased time and effort. She subsequently required assistance to doff her socks at the end of the session, due to fatigue.                               Cognition Arousal/Alertness: Awake/alert Behavior During Therapy: WFL for tasks assessed/performed Overall Cognitive Status: Within Functional Limits for tasks assessed                        Pertinent Vitals/ Pain       Pain Assessment Pain Assessment: No/denies pain         Frequency  Min 2X/week        Progress Toward Goals  OT Goals(current goals can now be found in the care plan section)  Progress towards OT goals: Progressing toward goals  Acute Rehab OT Goals Patient Stated Goal: to get better OT Goal Formulation:  With patient Time For Goal Achievement: 12/11/22 Potential to Achieve Goals: Good  Plan Discharge plan remains appropriate       AM-PAC OT "6 Clicks" Daily Activity     Outcome Measure   Help from another person eating meals?: None Help from another person taking care of personal grooming?: A Little Help from another person toileting, which includes using toliet, bedpan, or urinal?: A Little Help from another person bathing (including washing, rinsing, drying)?: A  Lot Help from another person to put on and taking off regular upper body clothing?: None Help from another person to put on and taking off regular lower body clothing?: A Lot 6 Click Score: 18    End of Session Equipment Utilized During Treatment: Gait belt;Rolling walker (2 wheels);Oxygen  OT Visit Diagnosis: Unsteadiness on feet (R26.81);Muscle weakness (generalized) (M62.81)   Activity Tolerance Patient limited by fatigue   Patient Left in bed;with call bell/phone within reach;with bed alarm set   Nurse Communication Mobility status        Time: 6256-3893 OT Time Calculation (min): 27 min  Charges: OT General Charges $OT Visit: 1 Visit OT Treatments $Self Care/Home Management : 8-22 mins $Therapeutic Activity: 8-22 mins     Leota Sauers, OTR/L 11/28/2022, 11:52 AM

## 2022-11-28 NOTE — Progress Notes (Addendum)
PROGRESS NOTE  [name]  LXB:[id number] DOB: 11-12-1933 DOA: 11/24/2022 PCP: Hadley Pen, MD   Brief Narrative: Patient is a 89 year old female with history of paroxysmal A-fib on Eliquis, COPD not on supplemental oxygen at home, TIA, hypertension, hyperlipidemia who presented to the emergency room with complaints of shortness of breath, fast heartbeat.  She was having flulike symptoms for a week and was tested positive for influenza B as an outpatient.  Chest x-ray had shown right upper lobe infiltrate was prescribed antibiotic but she had progressive worsening shortness of breath, cough.  On presentation, she was in A-fib with RVR.  She was saturating 88% on room air.  She had to be   placed on 2 L of oxygen per minute.  Chest x-ray showed moderately severe right upper lobe infiltrate, emphysematous changes.  Patient is currently being managed for COPD exacerbation, pneumonia, A-fib with RVR.  Cardiology,PCCM consulted.  Assessment & Plan:  Principal Problem:   Acute respiratory failure with hypoxia (HCC) Active Problems:   Right upper lobe pneumonia   COPD with acute exacerbation (HCC)   Paroxysmal atrial fibrillation (HCC)   Influenza B   Hyponatremia   Essential hypertension   Hyperlipidemia   Atrial fibrillation with RVR (HCC)   Acute respiratory failure with hypoxia: Secondary to pneumonia, COPD exacerbation, flu.  Not on oxygen at home.  Hypoxic on arrival.  Continue steroids, bronchodilators, supplemental oxygen as needed.  Continue flutter valve, incentive centimeter.  On 3 L of oxygen this morning.  Will try to wean .  Continue incentive spirometer.  PCCM consulted , recommended to continue current management.  Added hypertonic saline.  On discharge, she has been recommended to continue Brovana, Pulmicort, as needed DuoNeb.  She was offered to set up follow-up appointment at pulmonary clinic but family declined.  She still wheezing so we will continue steroids.  She may  end up with qualifying oxygen at home for discharge  Right upper lobe pneumonia: Diagnosed with pneumonia as an outpatient, was on oral antibiotics for that did not help.  Currently on ceftriaxone, azithro.  Cultures have been negative so far.  Strep pneumoniae negative, Legionella antigen negative. CT chest without contrast done on 1/7 showed extensive heterogenous, groundglass airspace opacity predominantly in the right upper lobe consistent with multifocal infection, pulmonary edema.  Also multiple calcified mediastinal, right hilar nodes consistent with  sequela of prior granulomatous infection. Developed  leukocytosis daily, most likely contributed by steroids.  COPD with acute exacerbation: Presented with wheezing, hypoxia.  Likely triggered with flu and pneumonia.  Continue steroids, Pulmicort, bronchodilators.  Continue Solu-Medrol today.  Wheezing persist.  Consulted pulmonology, does not follow with any pulmonologist on outpatient  Influenza B: Diagnosed as an outpatient.  Started on renally dosed Tamiflu  A-fib with RVR: Presented with heart rate in the range of 120s.  On Eliquis for anticoagulation.  Remains in A-fib today with heart rate ranging in the 110s to 120s.  Was following with EP as an outpatient. cardiology consulted, recommended to continue Cardizem drip  Hyperkalemia: Potassium 5.3 today.  Given a dose of Lokelma.  Check BMP tomorrow  Hyponatremia: Sodium of 124 on presentation, 132.  Elevated BNP.  Given a dose of Lasix 40 mg in the emergency department.  Continue to monitor.  Home Lasix, spironolactone on hold.  Currently appears euvolemic.  Hyperlipidemia: Continue statin  Hypertension: On benazepril, amlodipine at home.  Obesity: BMI of 32.9.  Debility/deconditioning: Home health recommended by PT/OT  DVT prophylaxis: apixaban (ELIQUIS) tablet 5 mg     Code Status: Full Code  Family Communication: daughter at bedside  Patient  status:Inpatient  Patient is from :Home  Anticipated discharge ZO:XWRU   Estimated DC date:Not sure, needs to optimize heart rate, respiratory status before discharge   Consultants: Cardiology, pulmonology  Procedures:None  Antimicrobials:  Anti-infectives (From admission, onward)    Start     Dose/Rate Route Frequency Ordered Stop   11/25/22 2300  azithromycin (ZITHROMAX) 500 mg in sodium chloride 0.9 % 250 mL IVPB        500 mg 250 mL/hr over 60 Minutes Intravenous Every 24 hours 11/24/22 2349 11/30/22 2259   11/25/22 2200  cefTRIAXone (ROCEPHIN) 2 g in sodium chloride 0.9 % 100 mL IVPB        2 g 200 mL/hr over 30 Minutes Intravenous Every 24 hours 11/24/22 2349 11/30/22 2159   11/25/22 1000  oseltamivir (TAMIFLU) capsule 30 mg        30 mg Oral 2 times daily 11/25/22 0024 11/29/22 2159   11/25/22 0030  oseltamivir (TAMIFLU) capsule 75 mg        75 mg Oral NOW 11/24/22 2349 11/25/22 0158   11/24/22 2245  levofloxacin (LEVAQUIN) IVPB 750 mg  Status:  Discontinued        750 mg 100 mL/hr over 90 Minutes Intravenous Every 24 hours 11/24/22 2231 11/24/22 2349       Subjective:  Patient seen and examined at bedside today.  Still on 2 to 3 L of oxygen per minute.  Still coughing.  Same as yesterday.  Heart rate in the range of 110-120.  She denies any worsening shortness of breath or cough.  Daughter at bedside   Objective: Vitals:   11/28/22 0600 11/28/22 0640 11/28/22 0752 11/28/22 0755  BP: 129/78     Pulse: 89     Resp: 16     Temp:  98.6 F (37 C)    TempSrc:  Oral    SpO2: 99%  93% 98%  Weight:      Height:        Intake/Output Summary (Last 24 hours) at 11/28/2022 1118 Last data filed at 11/28/2022 0932 Gross per 24 hour  Intake 1025.51 ml  Output 575 ml  Net 450.51 ml   Filed Weights   11/26/22 0343 11/27/22 0500 11/28/22 0241  Weight: 87.3 kg 89.2 kg 88.4 kg    Examination:   General exam: Overall comfortable, not in distress, weak,  deconditioned HEENT: PERRL Respiratory system: Bilateral diminished breath sounds, bilateral expiratory wheezing Cardiovascular system: Irregular regular rhythm  gastrointestinal system: Abdomen is nondistended, soft and nontender. Central nervous system: Alert and oriented Extremities: No edema, no clubbing ,no cyanosis Skin: No rashes, no ulcers,no icterus     Data Reviewed: I have personally reviewed following labs and imaging studies  CBC: Recent Labs  Lab 11/24/22 2117 11/25/22 0542 11/26/22 0534 11/28/22 0507  WBC 11.1* 9.1 9.7 14.9*  HGB 10.8* 10.6* 10.9* 10.8*  HCT 33.2* 32.3* 34.8* 33.7*  MCV 85.8 84.3 87.9 87.8  PLT 313 312 341 417*   Basic Metabolic Panel: Recent Labs  Lab 11/24/22 2117 11/25/22 0548 11/26/22 0534 11/27/22 0507 11/28/22 0507  NA 124* 124* 128* 131* 132*  K 5.5* 4.9  4.9 4.8 5.0 5.3*  CL 93* 92* 96* 98 98  CO2 22 19* 23 25 28   GLUCOSE 132* 138* 171* 158* 141*  BUN 16 14 18  27* 32*  CREATININE 0.75  0.62 0.70 0.88 0.77  CALCIUM 9.2 9.1 9.3 9.0 8.9  MG  --  1.3* 2.1  --   --      Recent Results (from the past 240 hour(s))  Resp panel by RT-PCR (RSV, Flu A&B, Covid) Anterior Nasal Swab     Status: Abnormal   Collection Time: 11/24/22 11:08 PM   Specimen: Anterior Nasal Swab  Result Value Ref Range Status   SARS Coronavirus 2 by RT PCR NEGATIVE NEGATIVE Final    Comment: (NOTE) SARS-CoV-2 target nucleic acids are NOT DETECTED.  The SARS-CoV-2 RNA is generally detectable in upper respiratory specimens during the acute phase of infection. The lowest concentration of SARS-CoV-2 viral copies this assay can detect is 138 copies/mL. A negative result does not preclude SARS-Cov-2 infection and should not be used as the sole basis for treatment or other patient management decisions. A negative result may occur with  improper specimen collection/handling, submission of specimen other than nasopharyngeal swab, presence of viral mutation(s)  within the areas targeted by this assay, and inadequate number of viral copies(<138 copies/mL). A negative result must be combined with clinical observations, patient history, and epidemiological information. The expected result is Negative.  Fact Sheet for Patients:  EntrepreneurPulse.com.au  Fact Sheet for Healthcare Providers:  IncredibleEmployment.be  This test is no t yet approved or cleared by the [place] FDA and  has been authorized for detection and/or diagnosis of SARS-CoV-2 by FDA under an Emergency Use Authorization (EUA). This EUA will remain  in effect (meaning this test can be used) for the duration of the COVID-19 declaration under Section 564(b)(1) of the Act, 21 U.S.C.section 360bbb-3(b)(1), unless the authorization is terminated  or revoked sooner.       Influenza A by PCR NEGATIVE NEGATIVE Final   Influenza B by PCR POSITIVE (A) NEGATIVE Final    Comment: (NOTE) The Xpert Xpress SARS-CoV-2/FLU/RSV plus assay is intended as an aid in the diagnosis of influenza from Nasopharyngeal swab specimens and should not be used as a sole basis for treatment. Nasal washings and aspirates are unacceptable for Xpert Xpress SARS-CoV-2/FLU/RSV testing.  Fact Sheet for Patients: EntrepreneurPulse.com.au  Fact Sheet for Healthcare Providers: IncredibleEmployment.be  This test is not yet approved or cleared by the [place] FDA and has been authorized for detection and/or diagnosis of SARS-CoV-2 by FDA under an Emergency Use Authorization (EUA). This EUA will remain in effect (meaning this test can be used) for the duration of the COVID-19 declaration under Section 564(b)(1) of the Act, 21 U.S.C. section 360bbb-3(b)(1), unless the authorization is terminated or revoked.     Resp Syncytial Virus by PCR NEGATIVE NEGATIVE Final    Comment: (NOTE) Fact Sheet for  Patients: EntrepreneurPulse.com.au  Fact Sheet for Healthcare Providers: IncredibleEmployment.be  This test is not yet approved or cleared by the [place] FDA and has been authorized for detection and/or diagnosis of SARS-CoV-2 by FDA under an Emergency Use Authorization (EUA). This EUA will remain in effect (meaning this test can be used) for the duration of the COVID-19 declaration under Section 564(b)(1) of the Act, 21 U.S.C. section 360bbb-3(b)(1), unless the authorization is terminated or revoked.  Performed at [facility], [place] [address], [place], [place] [address]      Radiology Studies: No results found.  Scheduled Meds:  apixaban  5 mg Oral BID   arformoterol  15 mcg Nebulization BID   budesonide (PULMICORT) nebulizer solution  0.5 mg Nebulization BID   chlorpheniramine-HYDROcodone  5 mL  Oral Q12H   gabapentin  300 mg Oral Daily   And   gabapentin  600 mg Oral QHS   guaiFENesin  600 mg Oral BID   labetalol  200 mg Oral BID   methylPREDNISolone (SOLU-MEDROL) injection  60 mg Intravenous Q24H   mirtazapine  15 mg Oral QHS   oseltamivir  30 mg Oral BID   pantoprazole  40 mg Oral Daily   polyethylene glycol  17 g Oral Daily   pravastatin  20 mg Oral QHS   revefenacin  175 mcg Nebulization Daily   senna-docusate  1 tablet Oral BID   sodium chloride flush  3 mL Intravenous Q12H   sodium chloride HYPERTONIC  4 mL Nebulization BID   Continuous Infusions:  azithromycin 500 mg (11/27/22 2308)   cefTRIAXone (ROCEPHIN)  IV 2 g (11/27/22 2103)   diltiazem (CARDIZEM) infusion 5 mg/hr (11/28/22 0728)     LOS: 4 days   Shelly Coss, MD [facility] P1/08/2023, 11:18 AM

## 2022-11-28 NOTE — Plan of Care (Signed)
Rates look ok. Continue IV dilt gtt. Once she is ready for dispo, she can resume her home medications. We can work on follow up for her. Cardiology will sign off

## 2022-11-29 DIAGNOSIS — J9601 Acute respiratory failure with hypoxia: Secondary | ICD-10-CM | POA: Diagnosis not present

## 2022-11-29 DIAGNOSIS — I5032 Chronic diastolic (congestive) heart failure: Secondary | ICD-10-CM | POA: Diagnosis not present

## 2022-11-29 DIAGNOSIS — I480 Paroxysmal atrial fibrillation: Secondary | ICD-10-CM | POA: Diagnosis not present

## 2022-11-29 LAB — BASIC METABOLIC PANEL
Anion gap: 8 (ref 5–15)
BUN: 29 mg/dL — ABNORMAL HIGH (ref 8–23)
CO2: 28 mmol/L (ref 22–32)
Calcium: 9 mg/dL (ref 8.9–10.3)
Chloride: 101 mmol/L (ref 98–111)
Creatinine, Ser: 0.82 mg/dL (ref 0.44–1.00)
GFR, Estimated: >60 mL/min (ref >60)
Glucose, Bld: 144 mg/dL — ABNORMAL HIGH (ref 70–99)
Potassium: 5 mmol/L (ref 3.5–5.1)
Sodium: 137 mmol/L (ref 135–145)

## 2022-11-29 LAB — CBC
HCT: 36.1 % (ref 36.0–46.0)
Hemoglobin: 11.2 g/dL — ABNORMAL LOW (ref 12.0–15.0)
MCH: 27.3 pg (ref 26.0–34.0)
MCHC: 31 g/dL (ref 30.0–36.0)
MCV: 88 fL (ref 80.0–100.0)
Platelets: 446 10*3/uL — ABNORMAL HIGH (ref 150–400)
RBC: 4.1 MIL/uL (ref 3.87–5.11)
RDW: 14.1 % (ref 11.5–15.5)
WBC: 15.8 10*3/uL — ABNORMAL HIGH (ref 4.0–10.5)
nRBC: 0 % (ref 0.0–0.2)

## 2022-11-29 MED ORDER — PREDNISONE 20 MG PO TABS
40.0000 mg | ORAL_TABLET | Freq: Every day | ORAL | Status: AC
  Administered 2022-11-29 – 2022-12-01 (×3): 40 mg via ORAL
  Filled 2022-11-29 (×3): qty 2

## 2022-11-29 MED ORDER — PREDNISONE 5 MG PO TABS: 10.0000 mg | ORAL_TABLET | Freq: Every day | ORAL | Status: DC

## 2022-11-29 MED ORDER — PREDNISONE 20 MG PO TABS
20.0000 mg | ORAL_TABLET | Freq: Every day | ORAL | Status: DC
  Administered 2022-12-03 – 2022-12-04 (×2): 20 mg via ORAL
  Filled 2022-11-29 (×2): qty 1

## 2022-11-29 NOTE — Progress Notes (Signed)
PROGRESS NOTE  [name]  GHW:[id number] DOB: [birth year] DOA: 11/24/2022 PCP: Myrlene Broker, MD   Brief Narrative: Patient is a 89 year old female with history of paroxysmal A-fib on Eliquis, COPD not on supplemental oxygen at home, TIA, hypertension, hyperlipidemia who presented to the emergency room with complaints of shortness of breath, fast heartbeat.  She was having flulike symptoms for a week and was tested positive for influenza B as an outpatient.  Chest x-ray had shown right upper lobe infiltrate was prescribed antibiotic but she had progressive worsening shortness of breath, cough.  On presentation, she was in A-fib with RVR.  She was saturating 88% on room air.  She had to be   placed on 2 L of oxygen per minute.  Chest x-ray showed moderately severe right upper lobe infiltrate, emphysematous changes.  Patient is currently being managed for COPD exacerbation, pneumonia, A-fib with RVR.  Cardiology,PCCM were consulted.  Assessment & Plan:  Principal Problem:   Acute respiratory failure with hypoxia (HCC) Active Problems:   Right upper lobe pneumonia   COPD with acute exacerbation (HCC)   Paroxysmal atrial fibrillation (HCC)   Influenza B   Hyponatremia   Essential hypertension   Hyperlipidemia   Atrial fibrillation with RVR (HCC)   Acute respiratory failure with hypoxia: Secondary to pneumonia, COPD exacerbation, flu.  Not on oxygen at home.  Hypoxic on arrival.  Continue steroids, bronchodilators, supplemental oxygen as needed.  Continue flutter valve, incentive centimeter  PCCM consulted , recommended to continue current management.  Added hypertonic saline.  On discharge, she has been recommended to continue Brovana, Pulmicort, as needed DuoNeb.  She was offered to set up follow-up appointment at pulmonary clinic but family declined.   This morning, she has been weaned to room air.  Wheezing is  better today.  Will change steroids to oral if possible   Right upper lobe  pneumonia: Diagnosed with pneumonia as an outpatient, was on oral antibiotics for that did not help.  Currently on ceftriaxone, azithro.  Cultures have been negative so far.  Strep pneumoniae negative, Legionella antigen negative. CT chest without contrast done on 1/7 showed extensive heterogenous, groundglass airspace opacity predominantly in the right upper lobe consistent with multifocal infection, pulmonary edema.  Also multiple calcified mediastinal, right hilar nodes consistent with  sequela of prior granulomatous infection. Developed  leukocytosis , most likely contributed by steroids.  COPD with acute exacerbation: Presented with wheezing, hypoxia.  Likely triggered with flu and pneumonia.  Continue steroids, Pulmicort, bronchodilators.  Pulmonology were following.  Will change steroids to oral if secondary to be on room air today.  Influenza B: Diagnosed as an outpatient.  Started on renally dosed Tamiflu  A-fib with RVR: Presented with heart rate in the range of 120s.  On Eliquis for anticoagulation.  Remains in A-fib today with heart rate ranging in the in low 100s  Was following with EP as an outpatient. cardiology consulted, recommended to continue Cardizem drip.  Will change Cardizem drip to oral at some point if her heart rate remains controlled.  Hyperkalemia: Resolved  Hyponatremia: Resolved.Home Lasix, spironolactone on hold.  Currently appears euvolemic.  Will resume on discharge.  Hyperlipidemia: Continue statin  Hypertension: On benazepril, amlodipine ,labetelol at home.  Obesity: BMI of 32.9.  Debility/deconditioning: Home health recommended by PT/OT        DVT prophylaxis: apixaban (ELIQUIS) tablet 5 mg     Code Status: Full Code  Family Communication: called and discussed with daughter on phone today  Patient status:Inpatient  Patient is from :Home  Anticipated discharge RW:ERXV   Estimated DC date:Not sure, needs to optimize heart rate, respiratory  status before discharge.may be 1-2 days more   Consultants: Cardiology, pulmonology  Procedures:None  Antimicrobials:  Anti-infectives (From admission, onward)    Start     Dose/Rate Route Frequency Ordered Stop   11/25/22 2300  azithromycin (ZITHROMAX) 500 mg in sodium chloride 0.9 % 250 mL IVPB        500 mg 250 mL/hr over 60 Minutes Intravenous Every 24 hours 11/24/22 2349 11/30/22 2259   11/25/22 2200  cefTRIAXone (ROCEPHIN) 2 g in sodium chloride 0.9 % 100 mL IVPB        2 g 200 mL/hr over 30 Minutes Intravenous Every 24 hours 11/24/22 2349 11/30/22 2159   11/25/22 1000  oseltamivir (TAMIFLU) capsule 30 mg        30 mg Oral 2 times daily 11/25/22 0024 11/29/22 0938   11/25/22 0030  oseltamivir (TAMIFLU) capsule 75 mg        75 mg Oral NOW 11/24/22 2349 11/25/22 0158   11/24/22 2245  levofloxacin (LEVAQUIN) IVPB 750 mg  Status:  Discontinued        750 mg 100 mL/hr over 90 Minutes Intravenous Every 24 hours 11/24/22 2231 11/24/22 2349       Subjective:  Patient seen and examined at bedside today.  She was on nebulization treatment.  She was on room air this morning.  She says she feels better.  She had a bowel movement yesterday.  EKG monitor was still showing A-fib with RVR when she was on neb treatment  Objective: Vitals:   11/28/22 2300 11/29/22 0558 11/29/22 0833 11/29/22 0835  BP:  123/77    Pulse:  (!) 102    Resp:  18    Temp: 98 F (36.7 C) 98 F (36.7 C)    TempSrc: Oral Oral    SpO2:  94% 92% 93%  Weight:  89.4 kg    Height:        Intake/Output Summary (Last 24 hours) at 11/29/2022 1021 Last data filed at 11/29/2022 0900 Gross per 24 hour  Intake 832.58 ml  Output 1250 ml  Net -417.42 ml   Filed Weights   11/27/22 0500 11/28/22 0241 11/29/22 0558  Weight: 89.2 kg 88.4 kg 89.4 kg    Examination:   General exam: Overall comfortable, not in distress,weak, deconditioned HEENT: PERRL Respiratory system: Diminished bilaterally, mild expiratory  wheezing  cardiovascular system: Irregular regular rhythm Gastrointestinal system: Abdomen is nondistended, soft and nontender. Central nervous system: Alert and oriented Extremities: No edema, no clubbing ,no cyanosis Skin: No rashes, no ulcers,no icterus       Data Reviewed: I have personally reviewed following labs and imaging studies  CBC: Recent Labs  Lab 11/24/22 2117 11/25/22 0542 11/26/22 0534 11/28/22 0507 11/29/22 0518  WBC 11.1* 9.1 9.7 14.9* 15.8*  HGB 10.8* 10.6* 10.9* 10.8* 11.2*  HCT 33.2* 32.3* 34.8* 33.7* 36.1  MCV 85.8 84.3 87.9 87.8 88.0  PLT 313 312 341 417* 400*   Basic Metabolic Panel: Recent Labs  Lab 11/25/22 0548 11/26/22 0534 11/27/22 0507 11/28/22 0507 11/29/22 0518  NA 124* 128* 131* 132* 137  K 4.9  4.9 4.8 5.0 5.3* 5.0  CL 92* 96* 98 98 101  CO2 19* 23 25 28 28   GLUCOSE 138* 171* 158* 141* 144*  BUN 14 18 27* 32* 29*  CREATININE 0.62 0.70 0.88 0.77 0.82  CALCIUM  9.1 9.3 9.0 8.9 9.0  MG 1.3* 2.1  --   --   --      Recent Results (from the past 240 hour(s))  Resp panel by RT-PCR (RSV, Flu A&B, Covid) Anterior Nasal Swab     Status: Abnormal   Collection Time: 11/24/22 11:08 PM   Specimen: Anterior Nasal Swab  Result Value Ref Range Status   SARS Coronavirus 2 by RT PCR NEGATIVE NEGATIVE Final    Comment: (NOTE) SARS-CoV-2 target nucleic acids are NOT DETECTED.  The SARS-CoV-2 RNA is generally detectable in upper respiratory specimens during the acute phase of infection. The lowest concentration of SARS-CoV-2 viral copies this assay can detect is 138 copies/mL. A negative result does not preclude SARS-Cov-2 infection and should not be used as the sole basis for treatment or other patient management decisions. A negative result may occur with  improper specimen collection/handling, submission of specimen other than nasopharyngeal swab, presence of viral mutation(s) within the areas targeted by this assay, and inadequate number of  viral copies(<138 copies/mL). A negative result must be combined with clinical observations, patient history, and epidemiological information. The expected result is Negative.  Fact Sheet for Patients:  BloggerCourse.com  Fact Sheet for Healthcare Providers:  SeriousBroker.it  This test is no t yet approved or cleared by the [place] FDA and  has been authorized for detection and/or diagnosis of SARS-CoV-2 by FDA under an Emergency Use Authorization (EUA). This EUA will remain  in effect (meaning this test can be used) for the duration of the COVID-19 declaration under Section 564(b)(1) of the Act, 21 U.S.C.section 360bbb-3(b)(1), unless the authorization is terminated  or revoked sooner.       Influenza A by PCR NEGATIVE NEGATIVE Final   Influenza B by PCR POSITIVE (A) NEGATIVE Final    Comment: (NOTE) The Xpert Xpress SARS-CoV-2/FLU/RSV plus assay is intended as an aid in the diagnosis of influenza from Nasopharyngeal swab specimens and should not be used as a sole basis for treatment. Nasal washings and aspirates are unacceptable for Xpert Xpress SARS-CoV-2/FLU/RSV testing.  Fact Sheet for Patients: BloggerCourse.com  Fact Sheet for Healthcare Providers: SeriousBroker.it  This test is not yet approved or cleared by the [place] FDA and has been authorized for detection and/or diagnosis of SARS-CoV-2 by FDA under an Emergency Use Authorization (EUA). This EUA will remain in effect (meaning this test can be used) for the duration of the COVID-19 declaration under Section 564(b)(1) of the Act, 21 U.S.C. section 360bbb-3(b)(1), unless the authorization is terminated or revoked.     Resp Syncytial Virus by PCR NEGATIVE NEGATIVE Final    Comment: (NOTE) Fact Sheet for Patients: BloggerCourse.com  Fact Sheet for Healthcare  Providers: SeriousBroker.it  This test is not yet approved or cleared by the [place] FDA and has been authorized for detection and/or diagnosis of SARS-CoV-2 by FDA under an Emergency Use Authorization (EUA). This EUA will remain in effect (meaning this test can be used) for the duration of the COVID-19 declaration under Section 564(b)(1) of the Act, 21 U.S.C. section 360bbb-3(b)(1), unless the authorization is terminated or revoked.  Performed at [facility], [address], [place], [place] [address]      Radiology Studies: No results found.  Scheduled Meds:  apixaban  5 mg Oral BID   arformoterol  15 mcg Nebulization BID   budesonide (PULMICORT) nebulizer solution  0.5 mg Nebulization BID   gabapentin  300 mg Oral Daily   And  gabapentin  600 mg Oral QHS   guaiFENesin  600 mg Oral BID   labetalol  200 mg Oral BID   methylPREDNISolone (SOLU-MEDROL) injection  60 mg Intravenous Q24H   mirtazapine  15 mg Oral QHS   pantoprazole  40 mg Oral Daily   polyethylene glycol  17 g Oral Daily   pravastatin  20 mg Oral QHS   revefenacin  175 mcg Nebulization Daily   senna-docusate  1 tablet Oral BID   sodium chloride flush  3 mL Intravenous Q12H   sodium chloride HYPERTONIC  4 mL Nebulization BID   Continuous Infusions:  azithromycin 500 mg (11/28/22 2243)   cefTRIAXone (ROCEPHIN)  IV 2 g (11/28/22 2146)   diltiazem (CARDIZEM) infusion 5 mg/hr (11/29/22 0641)     LOS: 5 days   Burnadette Pop, MD [facility] P1/09/2023, 10:21 AM

## 2022-11-29 NOTE — Progress Notes (Signed)
Physical Therapy Treatment Patient Details Name: [name] MRN: [id number] DOB: 1933/03/25 Today's Date: 11/29/2022   History of Present Illness 89 yr old female admitted with acute respiratory failure, flu, and COPD exacerbation. PMH: COVID, COPD, HTN, HLD, arthritis    PT Comments    General Comments: at first, pt appears AxO x 3 but as time passed, noted pt to have difficulty expressing her desire to change the TV channel and using the TV guide sheet to select which program she wanted.  Slightly delayed/foggy. Pt already OOB in recliner. General transfer comment: 50% VC's on proper hand placement and safety withn turn completion.  Assisted on/off BSC.  Assisted with peri care. General Gait Details: Transfers only due to increased HR 148 while on BSC and visible "wearnyness".  Mod c/o fatigue.  Weak. Assisted back to recliner and positioned to comfort.  D  Recommendations for follow up therapy are one component of a multi-disciplinary discharge planning process, led by the attending physician.  Recommendations may be updated based on patient status, additional functional criteria and insurance authorization.  Follow Up Recommendations  Home health PT     Assistance Recommended at Discharge Frequent or constant Supervision/Assistance  Patient can return home with the following Assistance with cooking/housework;Assist for transportation;Help with stairs or ramp for entrance;A little help with walking and/or transfers;A little help with bathing/dressing/bathroom   Equipment Recommendations  None recommended by PT    Recommendations for Other Services       Precautions / Restrictions Precautions Precautions: None Precaution Comments: monitor heart rate and O2 saturation Restrictions Weight Bearing Restrictions: No     Mobility  Bed Mobility               General bed mobility comments: OOB in recliner    Transfers Overall transfer level: Needs assistance Equipment  used: Rolling walker (2 wheels) Transfers: Sit to/from Stand Sit to Stand: Min guard, Min assist           General transfer comment: 50% VC's on proper hand placement and safety withn turn completion.  Assisted on/off BSC.  Assisted with peri care.    Ambulation/Gait               General Gait Details: Transfers only due to increased HR 148 while on BSC and visible "wearnyness".  Mod c/o fatigue.  Weak.   Stairs             Wheelchair Mobility    Modified Rankin (Stroke Patients Only)       Balance                                            Cognition Arousal/Alertness: Awake/alert Behavior During Therapy: WFL for tasks assessed/performed Overall Cognitive Status: Impaired/Different from baseline Area of Impairment: Problem solving, Safety/judgement                         Safety/Judgement: Decreased awareness of deficits   Problem Solving: Slow processing General Comments: at first, pt appears AxO x 3 but as time passed, noted pt to have difficulty expressing her desire to change the TV channel and using the TV guide sheet to select which program she wanted.  Slightly delayed/foggy.        Exercises      General Comments        Pertinent Vitals/Pain  Pain Assessment Pain Assessment: No/denies pain    Home Living                          Prior Function            PT Goals (current goals can now be found in the care plan section) Progress towards PT goals: Progressing toward goals    Frequency    Min 3X/week      PT Plan Current plan remains appropriate    Co-evaluation              AM-PAC PT "6 Clicks" Mobility   Outcome Measure  Help needed turning from your back to your side while in a flat bed without using bedrails?: A Little Help needed moving from lying on your back to sitting on the side of a flat bed without using bedrails?: A Little Help needed moving to and from a bed to a  chair (including a wheelchair)?: A Little Help needed standing up from a chair using your arms (e.g., wheelchair or bedside chair)?: A Little Help needed to walk in hospital room?: A Lot Help needed climbing 3-5 steps with a railing? : A Lot 6 Click Score: 16    End of Session Equipment Utilized During Treatment: Gait belt Activity Tolerance: Patient limited by fatigue Patient left: in chair;with call bell/phone within reach;with chair alarm set Nurse Communication: Mobility status PT Visit Diagnosis: Muscle weakness (generalized) (M62.81);Difficulty in walking, not elsewhere classified (R26.2)     Time: 1405-1430 PT Time Calculation (min) (ACUTE ONLY): 25 min  Charges:  $Therapeutic Activity: 23-37 mins                     Rica Koyanagi  PTA Acute  Rehabilitation Services Office M-F          [phone] Weekend pager [phone]

## 2022-11-29 NOTE — Progress Notes (Signed)
Rounding Note    Patient Name: [name] Date of Encounter: 11/29/2022  [place] Cardiologist: Janina Mayo, MD   Subjective   She does not feel much different. Does not feel like she is ready for dispo  Inpatient Medications    Scheduled Meds:  apixaban  5 mg Oral BID   arformoterol  15 mcg Nebulization BID   budesonide (PULMICORT) nebulizer solution  0.5 mg Nebulization BID   gabapentin  300 mg Oral Daily   And   gabapentin  600 mg Oral QHS   guaiFENesin  600 mg Oral BID   labetalol  200 mg Oral BID   methylPREDNISolone (SOLU-MEDROL) injection  60 mg Intravenous Q24H   mirtazapine  15 mg Oral QHS   pantoprazole  40 mg Oral Daily   polyethylene glycol  17 g Oral Daily   pravastatin  20 mg Oral QHS   revefenacin  175 mcg Nebulization Daily   senna-docusate  1 tablet Oral BID   sodium chloride flush  3 mL Intravenous Q12H   sodium chloride HYPERTONIC  4 mL Nebulization BID   Continuous Infusions:  azithromycin 500 mg (11/28/22 2243)   cefTRIAXone (ROCEPHIN)  IV 2 g (11/28/22 2146)   diltiazem (CARDIZEM) infusion 5 mg/hr (11/29/22 0641)   PRN Meds: acetaminophen **OR** acetaminophen, albuterol, ipratropium-albuterol, ondansetron **OR** ondansetron (ZOFRAN) IV   Vital Signs    Vitals:   11/28/22 2300 11/29/22 0558 11/29/22 0833 11/29/22 0835  BP:  123/77    Pulse:  (!) 102    Resp:  18    Temp: 98 F (36.7 C) 98 F (36.7 C)    TempSrc: Oral Oral    SpO2:  94% 92% 93%  Weight:  89.4 kg    Height:        Intake/Output Summary (Last 24 hours) at 11/29/2022 1146 Last data filed at 11/29/2022 0900 Gross per 24 hour  Intake 832.58 ml  Output 550 ml  Net 282.58 ml      11/29/2022    5:58 AM 11/28/2022    2:41 AM 11/27/2022    5:00 AM  Last 3 Weights  Weight (lbs) 197 lb 1.5 oz 194 lb 14.4 oz 196 lb 10.4 oz  Weight (kg) 89.4 kg 88.406 kg 89.2 kg      Telemetry    Afib, rates up to 140s, controlled on IV dilt - Personally  Reviewed  ECG    No new - Personally Reviewed  Physical Exam   Vitals:   11/29/22 0833 11/29/22 0835  BP:    Pulse:    Resp:    Temp:    SpO2: 92% 93%    GEN: in a chair, mildly diaphoretic Cardiac: IRRR, no murmurs, rubs, or gallops.  Respiratory: Clear to auscultation bilaterally. GI: Soft, nontender, non-distended  MS: No edema; No deformity. Neuro:  Nonfocal  Psych: Normal affect   Labs    High Sensitivity Troponin:  No results for input(s): "TROPONINIHS" in the last 720 hours.   Chemistry Recent Labs  Lab 11/25/22 0548 11/26/22 0534 11/27/22 0507 11/28/22 0507 11/29/22 0518  NA 124* 128* 131* 132* 137  K 4.9  4.9 4.8 5.0 5.3* 5.0  CL 92* 96* 98 98 101  CO2 19* 23 25 28 28   GLUCOSE 138* 171* 158* 141* 144*  BUN 14 18 27* 32* 29*  CREATININE 0.62 0.70 0.88 0.77 0.82  CALCIUM 9.1 9.3 9.0 8.9 9.0  MG 1.3* 2.1  --   --   --  GFRNONAA >60 >60 >60 >60 >60  ANIONGAP 13 9 8 6 8     Lipids No results for input(s): "CHOL", "TRIG", "HDL", "LABVLDL", "LDLCALC", "CHOLHDL" in the last 168 hours.  Hematology Recent Labs  Lab 11/26/22 0534 11/28/22 0507 11/29/22 0518  WBC 9.7 14.9* 15.8*  RBC 3.96 3.84* 4.10  HGB 10.9* 10.8* 11.2*  HCT 34.8* 33.7* 36.1  MCV 87.9 87.8 88.0  MCH 27.5 28.1 27.3  MCHC 31.3 32.0 31.0  RDW 13.9 14.1 14.1  PLT 341 417* 446*   Thyroid  Recent Labs  Lab 11/25/22 0548  TSH 0.666    BNP Recent Labs  Lab 11/24/22 2117  BNP 390.2*    DDimer No results for input(s): "DDIMER" in the last 168 hours.   Radiology    No results found.  Cardiac Studies   TTE 09/27/2022   1. Left ventricular ejection fraction, by estimation, is 60 to 65%. The  left ventricle has normal function. The left ventricle has no regional  wall motion abnormalities. Left ventricular diastolic parameters were  normal.   2. Right ventricular systolic function is normal. The right ventricular  size is normal. There is moderately elevated pulmonary  artery systolic  pressure.   3. Left atrial size was moderately dilated.   4. The mitral valve is normal in structure. Trivial mitral valve  regurgitation. No evidence of mitral stenosis. Severe mitral annular  calcification.   5. The aortic valve is tricuspid. There is moderate calcification of the  aortic valve. Aortic valve regurgitation is not visualized. Aortic valve  sclerosis/calcification is present, without any evidence of aortic  stenosis.   6. The inferior vena cava is normal in size with greater than 50%  respiratory variability, suggesting right atrial pressure of 3 mmHg.   Patient Profile      89 y.o. female with history of paroxysmal atrial fibrillation on Eliquis, chronic diastolic CHF, hypertension, hyperlipidemia, TIAs, COPD, GERD, and diverticulitis who is being seen 11/27/2022 for the evaluation of atrial fibrillation with RVR in the setting of COPD exacerbation and influenza B at the request of Dr. Tawanna Solo.   Assessment & Plan    pAF: no changes in her clinical status. She does not look like she is ready for discharge. She's deconditioned. Recommend PT/OT, ambulation if feasible. Continue to recommend IV dilt 5 mg/hr while she is recovering from influenza B. Continue supportive care. When she is up, ambulating and feels closer to discharge, can consolidate the diltiazem to in an oral dose (diltiazem 120 mg CD). Can continue home labetalol 200 mg BID. Can continue home dilt 30 q6H PRN. Continue eliquis 5 mg BID.   HTN: continue norvasc 10 mg at night.   Time Spent Directly with Patient:  I have spent a total of 35 minutes with the patient reviewing hospital notes, telemetry, EKGs, labs and examining the patient as well as establishing an assessment and plan that was discussed personally with the patient.  > 50% of time was spent in direct patient care.   For questions or updates, please contact [place] Please consult [email or web address] for contact info under         Signed, Janina Mayo, MD  11/29/2022, 11:46 AM

## 2022-11-30 DIAGNOSIS — J9601 Acute respiratory failure with hypoxia: Secondary | ICD-10-CM | POA: Diagnosis not present

## 2022-11-30 LAB — CBC
HCT: 35.8 % — ABNORMAL LOW (ref 36.0–46.0)
Hemoglobin: 11.3 g/dL — ABNORMAL LOW (ref 12.0–15.0)
MCH: 27.7 pg (ref 26.0–34.0)
MCHC: 31.6 g/dL (ref 30.0–36.0)
MCV: 87.7 fL (ref 80.0–100.0)
Platelets: 419 10*3/uL — ABNORMAL HIGH (ref 150–400)
RBC: 4.08 MIL/uL (ref 3.87–5.11)
RDW: 14 % (ref 11.5–15.5)
WBC: 16.1 10*3/uL — ABNORMAL HIGH (ref 4.0–10.5)
nRBC: 0 % (ref 0.0–0.2)

## 2022-11-30 MED ORDER — DILTIAZEM HCL ER COATED BEADS 120 MG PO CP24
120.0000 mg | ORAL_CAPSULE | Freq: Every day | ORAL | Status: DC
  Administered 2022-11-30: 120 mg via ORAL
  Filled 2022-11-30: qty 1

## 2022-11-30 MED ORDER — DILTIAZEM HCL-DEXTROSE 125-5 MG/125ML-% IV SOLN (PREMIX)
5.0000 mg/h | INTRAVENOUS | Status: DC
  Administered 2022-11-30: 7.5 mg/h via INTRAVENOUS
  Administered 2022-11-30: 5 mg/h via INTRAVENOUS
  Filled 2022-11-30 (×2): qty 125

## 2022-11-30 NOTE — Plan of Care (Signed)
Spoke with Mrs. Prindle daughter. She is hesitant to take her mother home , concerned she will come back. Agree her mother is still deconditioned. Heart rates are ok. Changing IV dilt to PO dilt. I discussed that she will have some tachycardia while she recovers. I will see her 1/24 to evaluate her. For now, she can be discharged if she is improved from a PNA standpoint. I discussed this with her daughter.

## 2022-11-30 NOTE — Care Management Important Message (Signed)
Important Message  Patient Details  Name: [name] MRN: [id number] Date of Birth: [birth year]   Medicare Important Message Given:  Yes     Memory Argue 11/30/2022, 12:09 PM

## 2022-11-30 NOTE — Progress Notes (Signed)
PROGRESS NOTE  [name]  NGE:[id number] DOB: November 26, 1932 DOA: 11/24/2022 PCP: Myrlene Broker, MD   Brief Narrative: Patient is a 89 year old female with history of paroxysmal A-fib on Eliquis, COPD not on supplemental oxygen at home, TIA, hypertension, hyperlipidemia who presented to the emergency room with complaints of shortness of breath, fast heartbeat.  She was having flulike symptoms for a week and was tested positive for influenza B as an outpatient.  Chest x-ray had shown right upper lobe infiltrate was prescribed antibiotic but she had progressive worsening shortness of breath, cough.  On presentation, she was in A-fib with RVR.  She was saturating 88% on room air.  She had to be   placed on 2 L of oxygen per minute.  Chest x-ray showed moderately severe right upper lobe infiltrate, emphysematous changes.  Patient is currently being managed for COPD exacerbation, pneumonia, A-fib with RVR.  Cardiology,PCCM were consulted.  Assessment & Plan:  Principal Problem:   Acute respiratory failure with hypoxia (HCC) Active Problems:   Right upper lobe pneumonia   COPD with acute exacerbation (HCC)   Paroxysmal atrial fibrillation (HCC)   Influenza B   Hyponatremia   Essential hypertension   Hyperlipidemia   Atrial fibrillation with RVR (HCC)   Acute respiratory failure with hypoxia/COPD exacerbation: Secondary to pneumonia, COPD exacerbation, flu.  Not on oxygen at home.  Hypoxic on arrival.  Continue steroids, bronchodilators.  Continue flutter valve, incentive centimeter  PCCM consulted , recommended to continue current management.  Added hypertonic saline.  On discharge, she has been recommended to continue Brovana, Pulmicort, as needed DuoNeb.  She was offered to set up follow-up appointment at pulmonary clinic but family declined.   Since 1/11, she has been weaned to room air.  No wheezing today, on oral steroids now  Right upper lobe pneumonia: Diagnosed with pneumonia as an  outpatient, was on oral antibiotics for that did not help.She completed the course of ceftriaxone, azithro.  Cultures have been negative so far.  Strep pneumoniae negative, Legionella antigen negative. CT chest without contrast done on 1/7 showed extensive heterogenous, groundglass airspace opacity predominantly in the right upper lobe consistent with multifocal infection, pulmonary edema.  Also multiple calcified mediastinal, right hilar nodes consistent with  sequela of prior granulomatous infection. Developed  leukocytosis , most likely contributed by steroids.  Influenza B: Diagnosed as an outpatient.  Finished the course of  Tamiflu  A-fib with RVR: Presented with heart rate in the range of 120s.  On Eliquis for anticoagulation.  Remains in A-fib today with heart rate ranging from 90-120  Was following with EP as an outpatient. cardiology consulted and following.  Rate is better than yesterday.  Cardizem changed to oral.  Hyponatremia: Resolved.Home Lasix, spironolactone on hold.  Currently appears euvolemic.  Will resume on discharge.  Hyperlipidemia: Continue statin  Hypertension: On benazepril, amlodipine ,labetelol at home.BP stable  Obesity: BMI of 32.9.  Debility/deconditioning: Home health recommended by PT/OT        DVT prophylaxis: apixaban (ELIQUIS) tablet 5 mg     Code Status: Full Code  Family Communication: Discussed with daughter at bedside  Patient status:Inpatient  Patient is from :Home  Anticipated discharge MW:NUUV   Estimated DC date: In 1-2 days.  Waiting for heart rate to be better before discharge   Consultants: Cardiology, pulmonology  Procedures:None  Antimicrobials:  Anti-infectives (From admission, onward)    Start     Dose/Rate Route Frequency Ordered Stop   11/25/22 2300  azithromycin (  ZITHROMAX) 500 mg in sodium chloride 0.9 % 250 mL IVPB        500 mg 250 mL/hr over 60 Minutes Intravenous Every 24 hours 11/24/22 2349 11/29/22 2349    11/25/22 2200  cefTRIAXone (ROCEPHIN) 2 g in sodium chloride 0.9 % 100 mL IVPB        2 g 200 mL/hr over 30 Minutes Intravenous Every 24 hours 11/24/22 2349 11/29/22 2158   11/25/22 1000  oseltamivir (TAMIFLU) capsule 30 mg        30 mg Oral 2 times daily 11/25/22 0024 11/29/22 0938   11/25/22 0030  oseltamivir (TAMIFLU) capsule 75 mg        75 mg Oral NOW 11/24/22 2349 11/25/22 0158   11/24/22 2245  levofloxacin (LEVAQUIN) IVPB 750 mg  Status:  Discontinued        750 mg 100 mL/hr over 90 Minutes Intravenous Every 24 hours 11/24/22 2231 11/24/22 2349       Subjective:  Patient seen and examined at bedside today.  Hemodynamically stable.  She looks comfortable.  She was sitting in the chair.  She was on room air.  Not coughing.  Daughter at bedside.  She was not in any kind of respite distress.  Heart rate is still in the range of 90-120 with A-fib  Objective: Vitals:   11/29/22 2024 11/29/22 2056 11/30/22 0500 11/30/22 0921  BP:  (!) 166/91 126/77   Pulse:  (!) 118 90   Resp:  20 16   Temp:  98.3 F (36.8 C) 97.8 F (36.6 C)   TempSrc:  Oral Oral   SpO2: 95% 96% 96% 96%  Weight:      Height:        Intake/Output Summary (Last 24 hours) at 11/30/2022 1120 Last data filed at 11/30/2022 0542 Gross per 24 hour  Intake --  Output 1850 ml  Net -1850 ml   Filed Weights   11/27/22 0500 11/28/22 0241 11/29/22 0558  Weight: 89.2 kg 88.4 kg 89.4 kg    Examination:   General exam: Overall comfortable, not in distress HEENT: PERRL Respiratory system:  no wheezes or crackles , diminished sounds bilaterally Cardiovascular system: Irregular regular rhythm Gastrointestinal system: Abdomen is nondistended, soft and nontender. Central nervous system: Alert and oriented Extremities: No edema, no clubbing ,no cyanosis Skin: No rashes, no ulcers,no icterus         Data Reviewed: I have personally reviewed following labs and imaging studies  CBC: Recent Labs  Lab 11/25/22 0542  11/26/22 0534 11/28/22 0507 11/29/22 0518 11/30/22 0450  WBC 9.1 9.7 14.9* 15.8* 16.1*  HGB 10.6* 10.9* 10.8* 11.2* 11.3*  HCT 32.3* 34.8* 33.7* 36.1 35.8*  MCV 84.3 87.9 87.8 88.0 87.7  PLT 312 341 417* 446* 419*   Basic Metabolic Panel: Recent Labs  Lab 11/25/22 0548 11/26/22 0534 11/27/22 0507 11/28/22 0507 11/29/22 0518  NA 124* 128* 131* 132* 137  K 4.9  4.9 4.8 5.0 5.3* 5.0  CL 92* 96* 98 98 101  CO2 19* 23 25 28 28   GLUCOSE 138* 171* 158* 141* 144*  BUN 14 18 27* 32* 29*  CREATININE 0.62 0.70 0.88 0.77 0.82  CALCIUM 9.1 9.3 9.0 8.9 9.0  MG 1.3* 2.1  --   --   --      Recent Results (from the past 240 hour(s))  Resp panel by RT-PCR (RSV, Flu A&B, Covid) Anterior Nasal Swab     Status: Abnormal   Collection Time: 11/24/22 11:08  PM   Specimen: Anterior Nasal Swab  Result Value Ref Range Status   SARS Coronavirus 2 by RT PCR NEGATIVE NEGATIVE Final    Comment: (NOTE) SARS-CoV-2 target nucleic acids are NOT DETECTED.  The SARS-CoV-2 RNA is generally detectable in upper respiratory specimens during the acute phase of infection. The lowest concentration of SARS-CoV-2 viral copies this assay can detect is 138 copies/mL. A negative result does not preclude SARS-Cov-2 infection and should not be used as the sole basis for treatment or other patient management decisions. A negative result may occur with  improper specimen collection/handling, submission of specimen other than nasopharyngeal swab, presence of viral mutation(s) within the areas targeted by this assay, and inadequate number of viral copies(<138 copies/mL). A negative result must be combined with clinical observations, patient history, and epidemiological information. The expected result is Negative.  Fact Sheet for Patients:  EntrepreneurPulse.com.au  Fact Sheet for Healthcare Providers:  IncredibleEmployment.be  This test is no t yet approved or cleared by the  [place] FDA and  has been authorized for detection and/or diagnosis of SARS-CoV-2 by FDA under an Emergency Use Authorization (EUA). This EUA will remain  in effect (meaning this test can be used) for the duration of the COVID-19 declaration under Section 564(b)(1) of the Act, 21 U.S.C.section 360bbb-3(b)(1), unless the authorization is terminated  or revoked sooner.       Influenza A by PCR NEGATIVE NEGATIVE Final   Influenza B by PCR POSITIVE (A) NEGATIVE Final    Comment: (NOTE) The Xpert Xpress SARS-CoV-2/FLU/RSV plus assay is intended as an aid in the diagnosis of influenza from Nasopharyngeal swab specimens and should not be used as a sole basis for treatment. Nasal washings and aspirates are unacceptable for Xpert Xpress SARS-CoV-2/FLU/RSV testing.  Fact Sheet for Patients: EntrepreneurPulse.com.au  Fact Sheet for Healthcare Providers: IncredibleEmployment.be  This test is not yet approved or cleared by the [place] FDA and has been authorized for detection and/or diagnosis of SARS-CoV-2 by FDA under an Emergency Use Authorization (EUA). This EUA will remain in effect (meaning this test can be used) for the duration of the COVID-19 declaration under Section 564(b)(1) of the Act, 21 U.S.C. section 360bbb-3(b)(1), unless the authorization is terminated or revoked.     Resp Syncytial Virus by PCR NEGATIVE NEGATIVE Final    Comment: (NOTE) Fact Sheet for Patients: EntrepreneurPulse.com.au  Fact Sheet for Healthcare Providers: IncredibleEmployment.be  This test is not yet approved or cleared by the [place] FDA and has been authorized for detection and/or diagnosis of SARS-CoV-2 by FDA under an Emergency Use Authorization (EUA). This EUA will remain in effect (meaning this test can be used) for the duration of the COVID-19 declaration under Section 564(b)(1) of the Act, 21  U.S.C. section 360bbb-3(b)(1), unless the authorization is terminated or revoked.  Performed at [facility], [place] [address] [place], [place] [address]      Radiology Studies: No results found.  Scheduled Meds:  apixaban  5 mg Oral BID   arformoterol  15 mcg Nebulization BID   budesonide (PULMICORT) nebulizer solution  0.5 mg Nebulization BID   diltiazem  120 mg Oral Daily   gabapentin  300 mg Oral Daily   And   gabapentin  600 mg Oral QHS   guaiFENesin  600 mg Oral BID   labetalol  200 mg Oral BID   mirtazapine  15 mg Oral QHS   pantoprazole  40 mg Oral Daily   polyethylene glycol  17  g Oral Daily   pravastatin  20 mg Oral QHS   [START ON 12/06/2022] predniSONE  10 mg Oral Q breakfast   [START ON 12/03/2022] predniSONE  20 mg Oral Q breakfast   predniSONE  40 mg Oral Q breakfast   revefenacin  175 mcg Nebulization Daily   senna-docusate  1 tablet Oral BID   sodium chloride flush  3 mL Intravenous Q12H   sodium chloride HYPERTONIC  4 mL Nebulization BID   Continuous Infusions:     LOS: 6 days   Burnadette Pop, MD [facility] P1/10/2023, 11:20 AM

## 2022-11-30 NOTE — Progress Notes (Signed)
Physical Therapy Treatment Patient Details Name: [name] MRN: [id number] DOB: 05/30/1933 Today's Date: 11/30/2022   History of Present Illness 89 yr old female admitted with acute respiratory failure, flu, and COPD exacerbation. PMH: COVID, COPD, HTN, HLD, arthritis    PT Comments    General Comments: AxO x 3 slight impairment related to her current medical status.  Pleasant and cooperative.  Fatigues easily and noted increased anxiety when SOB.  "I can't do anything when I can't breath". Assisted with amb while monitoring vitals.  General transfer comment: 50% VC's on proper hand placement and safety withn turn completion. General Gait Details: limited distance of 16 feet due to dyspnea/fatigue/weakness.  HR range 78 - 138 (A Fib) and RA avg 95% with 3/4 dyspnea.  Returned to room in recliner.  Too fatigued to walk back.  Pt plans to return home.  Will need family support.   Recommendations for follow up therapy are one component of a multi-disciplinary discharge planning process, led by the attending physician.  Recommendations may be updated based on patient status, additional functional criteria and insurance authorization.  Follow Up Recommendations  Home health PT     Assistance Recommended at Discharge Frequent or constant Supervision/Assistance  Patient can return home with the following Assistance with cooking/housework;Assist for transportation;Help with stairs or ramp for entrance;A little help with walking and/or transfers;A little help with bathing/dressing/bathroom   Equipment Recommendations  None recommended by PT    Recommendations for Other Services       Precautions / Restrictions Precautions Precautions: None Precaution Comments: monitor heart rate and O2 saturation Restrictions Weight Bearing Restrictions: No     Mobility  Bed Mobility               General bed mobility comments: OOB in recliner    Transfers Overall transfer level: Needs  assistance Equipment used: Rolling walker (2 wheels) Transfers: Sit to/from Stand Sit to Stand: Min guard, Min assist           General transfer comment: 50% VC's on proper hand placement and safety withn turn completion.    Ambulation/Gait Ambulation/Gait assistance: Min guard, Min assist Gait Distance (Feet): 16 Feet Assistive device: Rolling walker (2 wheels) Gait Pattern/deviations: Step-through pattern, Decreased stride length Gait velocity: decreased     General Gait Details: limited distance of 16 feet due to dyspnea/fatigue/weakness.  HR range 78 - 138 (A Fib) and RA avg 95% with 3/4 dyspnea.   Stairs             Wheelchair Mobility    Modified Rankin (Stroke Patients Only)       Balance                                            Cognition Arousal/Alertness: Awake/alert   Overall Cognitive Status: Within Functional Limits for tasks assessed                                 General Comments: AxO x 3 slight impairment related to her current medical status.  Pleasant and cooperative.  Fatigues easily and noted increased anxiety when SOB.  "I can't do anything when I can't breath".        Exercises      General Comments        Pertinent Vitals/Pain  Pain Assessment Pain Assessment: No/denies pain    Home Living                          Prior Function            PT Goals (current goals can now be found in the care plan section) Progress towards PT goals: Progressing toward goals    Frequency    Min 3X/week      PT Plan Current plan remains appropriate    Co-evaluation              AM-PAC PT "6 Clicks" Mobility   Outcome Measure  Help needed turning from your back to your side while in a flat bed without using bedrails?: A Little Help needed moving from lying on your back to sitting on the side of a flat bed without using bedrails?: A Little Help needed moving to and from a bed to  a chair (including a wheelchair)?: A Little Help needed standing up from a chair using your arms (e.g., wheelchair or bedside chair)?: A Little Help needed to walk in hospital room?: A Lot Help needed climbing 3-5 steps with a railing? : A Lot 6 Click Score: 16    End of Session Equipment Utilized During Treatment: Gait belt Activity Tolerance: Patient limited by fatigue Patient left: in chair;with call bell/phone within reach;with chair alarm set Nurse Communication: Mobility status PT Visit Diagnosis: Muscle weakness (generalized) (M62.81);Difficulty in walking, not elsewhere classified (R26.2)     Time: 6808-8110 PT Time Calculation (min) (ACUTE ONLY): 25 min  Charges:  $Gait Training: 8-22 mins $Therapeutic Activity: 8-22 mins                     Rica Koyanagi  PTA Acute  Rehabilitation Services Office M-F          [phone] Weekend pager [phone]

## 2022-12-01 DIAGNOSIS — I10 Essential (primary) hypertension: Secondary | ICD-10-CM | POA: Diagnosis not present

## 2022-12-01 DIAGNOSIS — J441 Chronic obstructive pulmonary disease with (acute) exacerbation: Secondary | ICD-10-CM | POA: Diagnosis not present

## 2022-12-01 DIAGNOSIS — J9601 Acute respiratory failure with hypoxia: Secondary | ICD-10-CM | POA: Diagnosis not present

## 2022-12-01 DIAGNOSIS — I4891 Unspecified atrial fibrillation: Secondary | ICD-10-CM | POA: Diagnosis not present

## 2022-12-01 LAB — CBC
HCT: 38.6 % (ref 36.0–46.0)
Hemoglobin: 12.4 g/dL (ref 12.0–15.0)
MCH: 28.1 pg (ref 26.0–34.0)
MCHC: 32.1 g/dL (ref 30.0–36.0)
MCV: 87.5 fL (ref 80.0–100.0)
Platelets: 418 10*3/uL — ABNORMAL HIGH (ref 150–400)
RBC: 4.41 MIL/uL (ref 3.87–5.11)
RDW: 14.2 % (ref 11.5–15.5)
WBC: 16.4 10*3/uL — ABNORMAL HIGH (ref 4.0–10.5)
nRBC: 0 % (ref 0.0–0.2)

## 2022-12-01 LAB — BASIC METABOLIC PANEL
Anion gap: 9 (ref 5–15)
BUN: 26 mg/dL — ABNORMAL HIGH (ref 8–23)
CO2: 31 mmol/L (ref 22–32)
Calcium: 9.1 mg/dL (ref 8.9–10.3)
Chloride: 98 mmol/L (ref 98–111)
Creatinine, Ser: 0.82 mg/dL (ref 0.44–1.00)
GFR, Estimated: >60 mL/min (ref >60)
Glucose, Bld: 98 mg/dL (ref 70–99)
Potassium: 4.4 mmol/L (ref 3.5–5.1)
Sodium: 138 mmol/L (ref 135–145)

## 2022-12-01 MED ORDER — SPIRONOLACTONE 25 MG PO TABS
25.0000 mg | ORAL_TABLET | Freq: Every day | ORAL | Status: DC
  Administered 2022-12-01 – 2022-12-04 (×4): 25 mg via ORAL
  Filled 2022-12-01 (×4): qty 1

## 2022-12-01 MED ORDER — AMIODARONE HCL IN DEXTROSE 360-4.14 MG/200ML-% IV SOLN
60.0000 mg/h | INTRAVENOUS | Status: DC
  Administered 2022-12-01: 60 mg/h via INTRAVENOUS
  Filled 2022-12-01: qty 200

## 2022-12-01 MED ORDER — AMIODARONE HCL IN DEXTROSE 360-4.14 MG/200ML-% IV SOLN
30.0000 mg/h | INTRAVENOUS | Status: DC
  Administered 2022-12-01 – 2022-12-02 (×3): 30 mg/h via INTRAVENOUS
  Filled 2022-12-01 (×6): qty 200

## 2022-12-01 MED ORDER — AMIODARONE LOAD VIA INFUSION
150.0000 mg | Freq: Once | INTRAVENOUS | Status: AC
  Administered 2022-12-01: 150 mg via INTRAVENOUS
  Filled 2022-12-01: qty 83.34

## 2022-12-01 NOTE — Progress Notes (Signed)
PROGRESS NOTE  [name]  WCB:[id number] DOB: March 06, 1933 DOA: 11/24/2022 PCP: Myrlene Broker, MD   Brief Narrative: Patient is a 89 year old female with history of paroxysmal A-fib on Eliquis, COPD not on supplemental oxygen at home, TIA, hypertension, hyperlipidemia who presented to the emergency room with complaints of shortness of breath, fast heartbeat.  She was having flulike symptoms for a week and was tested positive for influenza B as an outpatient.  Chest x-ray had shown right upper lobe infiltrate was prescribed antibiotic but she had progressive worsening shortness of breath, cough.  On presentation, she was in A-fib with RVR.  She was saturating 88% on room air.  She had to be   placed on 2 L of oxygen per minute.  Chest x-ray showed moderately severe right upper lobe infiltrate, emphysematous changes.  Patient is currently being managed for COPD exacerbation, pneumonia, A-fib with RVR.  Cardiology,PCCM were consulted.  Assessment & Plan:  Principal Problem:   Acute respiratory failure with hypoxia (HCC) Active Problems:   Right upper lobe pneumonia   COPD with acute exacerbation (HCC)   Paroxysmal atrial fibrillation (HCC)   Influenza B   Hyponatremia   Essential hypertension   Hyperlipidemia   Atrial fibrillation with RVR (HCC)   Acute respiratory failure with hypoxia/COPD exacerbation: Secondary to pneumonia, COPD exacerbation, flu.  Not on oxygen at home.  Hypoxic on arrival.  Continue steroids, bronchodilators.  Continue flutter valve, incentive centimeter  PCCM consulted , recommended to continue current management.  Added hypertonic saline.  On discharge, she has been recommended to continue Brovana, Pulmicort, as needed DuoNeb.  She was offered to set up follow-up appointment at pulmonary clinic but family declined.   Since 1/11, she has been weaned to room air.  No significant wheezing today, on oral steroids now  Right upper lobe pneumonia: Diagnosed with  pneumonia as an outpatient, was on oral antibiotics for that did not help.She completed the course of ceftriaxone, azithro.  Cultures have been negative so far.  Strep pneumoniae negative, Legionella antigen negative. CT chest without contrast done on 1/7 showed extensive heterogenous, groundglass airspace opacity predominantly in the right upper lobe consistent with multifocal infection, pulmonary edema.  Also multiple calcified mediastinal, right hilar nodes consistent with  sequela of prior granulomatous infection. Developed  leukocytosis , most likely contributed by steroids.  Continue to monitor.  Influenza B: Diagnosed as an outpatient.  Finished the course of  Tamiflu  A-fib with RVR:  On Eliquis for anticoagulation.Was following with EP as an outpatient.  Hospital course remarkable for persistent A-fib with RVR .cardiology consulted and following.  Was changed to Cardizem oral on 1/12 but again went to A-fib with RVR so restarted on Cardizem drip.  Also started on amiodarone drip IV today, plan for short course  Hyperlipidemia: Continue statin  Hypertension: On benazepril, amlodipine ,labetelol at home.BP stable  Obesity: BMI of 32.9.  Debility/deconditioning: Home health recommended by PT/OT        DVT prophylaxis: apixaban (ELIQUIS) tablet 5 mg     Code Status: Full Code  Family Communication: Discussed with daughter at bedside on 1/12  Patient status:Inpatient  Patient is from :Home  Anticipated discharge [place]:[facility]   Estimated DC date: In 1-2 days.  Waiting for heart rate to be better before discharge   Consultants: Cardiology, pulmonology  Procedures:None  Antimicrobials:  Anti-infectives (From admission, onward)    Start     Dose/Rate Route Frequency Ordered Stop   11/25/22 2300  azithromycin (ZITHROMAX)  500 mg in sodium chloride 0.9 % 250 mL IVPB        500 mg 250 mL/hr over 60 Minutes Intravenous Every 24 hours 11/24/22 2349 11/29/22 2349   11/25/22 2200   cefTRIAXone (ROCEPHIN) 2 g in sodium chloride 0.9 % 100 mL IVPB        2 g 200 mL/hr over 30 Minutes Intravenous Every 24 hours 11/24/22 2349 11/29/22 2158   11/25/22 1000  oseltamivir (TAMIFLU) capsule 30 mg        30 mg Oral 2 times daily 11/25/22 0024 11/29/22 0938   11/25/22 0030  oseltamivir (TAMIFLU) capsule 75 mg        75 mg Oral NOW 11/24/22 2349 11/25/22 0158   11/24/22 2245  levofloxacin (LEVAQUIN) IVPB 750 mg  Status:  Discontinued        750 mg 100 mL/hr over 90 Minutes Intravenous Every 24 hours 11/24/22 2231 11/24/22 2349       Subjective:  Patient seen and examined at bedside today.  Remains on room air.  Had to be started on Cardizem drip last night because her heart rate was considered in the range of 130s.  Cardiology consulted.  Started on IV amiodarone.  Denies any worsening shortness of breath or cough.  Objective: Vitals:   11/29/22 2024 11/29/22 2056 11/30/22 0500 11/30/22 0921  BP:  (!) 166/91 126/77   Pulse:  (!) 118 90   Resp:  20 16   Temp:  98.3 F (36.8 C) 97.8 F (36.6 C)   TempSrc:  Oral Oral   SpO2: 95% 96% 96% 96%  Weight:      Height:        Intake/Output Summary (Last 24 hours) at 11/30/2022 1120 Last data filed at 11/30/2022 0542 Gross per 24 hour  Intake --  Output 1850 ml  Net -1850 ml   Filed Weights   11/27/22 0500 11/28/22 0241 11/29/22 0558  Weight: 89.2 kg 88.4 kg 89.4 kg    Examination:   General exam: Overall comfortable, not in distress, pleasant deconditioned elderly female HEENT: PERRL Respiratory system: Mildly diminished sounds bilaterally, mild expiratory wheezing Cardiovascular system: Irregular regular rhythm  gastrointestinal system: Abdomen is nondistended, soft and nontender. Central nervous system: Alert and oriented Extremities: No edema, no clubbing ,no cyanosis Skin: No rashes, no ulcers,no icterus        Data Reviewed: I have personally reviewed following labs and imaging studies  CBC: Recent Labs   Lab 11/25/22 0542 11/26/22 0534 11/28/22 0507 11/29/22 0518 11/30/22 0450  WBC 9.1 9.7 14.9* 15.8* 16.1*  HGB 10.6* 10.9* 10.8* 11.2* 11.3*  HCT 32.3* 34.8* 33.7* 36.1 35.8*  MCV 84.3 87.9 87.8 88.0 87.7  PLT 312 341 417* 446* 419*   Basic Metabolic Panel: Recent Labs  Lab 11/25/22 0548 11/26/22 0534 11/27/22 0507 11/28/22 0507 11/29/22 0518  NA 124* 128* 131* 132* 137  K 4.9  4.9 4.8 5.0 5.3* 5.0  CL 92* 96* 98 98 101  CO2 19* 23 25 28 28   GLUCOSE 138* 171* 158* 141* 144*  BUN 14 18 27* 32* 29*  CREATININE 0.62 0.70 0.88 0.77 0.82  CALCIUM 9.1 9.3 9.0 8.9 9.0  MG 1.3* 2.1  --   --   --      Recent Results (from the past 240 hour(s))  Resp panel by RT-PCR (RSV, Flu A&B, Covid) Anterior Nasal Swab     Status: Abnormal   Collection Time: 11/24/22 11:08 PM   Specimen: Anterior  Nasal Swab  Result Value Ref Range Status   SARS Coronavirus 2 by RT PCR NEGATIVE NEGATIVE Final    Comment: (NOTE) SARS-CoV-2 target nucleic acids are NOT DETECTED.  The SARS-CoV-2 RNA is generally detectable in upper respiratory specimens during the acute phase of infection. The lowest concentration of SARS-CoV-2 viral copies this assay can detect is 138 copies/mL. A negative result does not preclude SARS-Cov-2 infection and should not be used as the sole basis for treatment or other patient management decisions. A negative result may occur with  improper specimen collection/handling, submission of specimen other than nasopharyngeal swab, presence of viral mutation(s) within the areas targeted by this assay, and inadequate number of viral copies(<138 copies/mL). A negative result must be combined with clinical observations, patient history, and epidemiological information. The expected result is Negative.  Fact Sheet for Patients:  BloggerCourse.com  Fact Sheet for Healthcare Providers:  SeriousBroker.it  This test is no t yet  approved or cleared by the [place] FDA and  has been authorized for detection and/or diagnosis of SARS-CoV-2 by FDA under an Emergency Use Authorization (EUA). This EUA will remain  in effect (meaning this test can be used) for the duration of the COVID-19 declaration under Section 564(b)(1) of the Act, 21 U.S.C.section 360bbb-3(b)(1), unless the authorization is terminated  or revoked sooner.       Influenza A by PCR NEGATIVE NEGATIVE Final   Influenza B by PCR POSITIVE (A) NEGATIVE Final    Comment: (NOTE) The Xpert Xpress SARS-CoV-2/FLU/RSV plus assay is intended as an aid in the diagnosis of influenza from Nasopharyngeal swab specimens and should not be used as a sole basis for treatment. Nasal washings and aspirates are unacceptable for Xpert Xpress SARS-CoV-2/FLU/RSV testing.  Fact Sheet for Patients: BloggerCourse.com  Fact Sheet for Healthcare Providers: SeriousBroker.it  This test is not yet approved or cleared by the [place] FDA and has been authorized for detection and/or diagnosis of SARS-CoV-2 by FDA under an Emergency Use Authorization (EUA). This EUA will remain in effect (meaning this test can be used) for the duration of the COVID-19 declaration under Section 564(b)(1) of the Act, 21 U.S.C. section 360bbb-3(b)(1), unless the authorization is terminated or revoked.     Resp Syncytial Virus by PCR NEGATIVE NEGATIVE Final    Comment: (NOTE) Fact Sheet for Patients: BloggerCourse.com  Fact Sheet for Healthcare Providers: SeriousBroker.it  This test is not yet approved or cleared by the [place] FDA and has been authorized for detection and/or diagnosis of SARS-CoV-2 by FDA under an Emergency Use Authorization (EUA). This EUA will remain in effect (meaning this test can be used) for the duration of the COVID-19 declaration under Section 564(b)(1)  of the Act, 21 U.S.C. section 360bbb-3(b)(1), unless the authorization is terminated or revoked.  Performed at [facility], [address], [place], [place] [address]      Radiology Studies: No results found.  Scheduled Meds:  apixaban  5 mg Oral BID   arformoterol  15 mcg Nebulization BID   budesonide (PULMICORT) nebulizer solution  0.5 mg Nebulization BID   diltiazem  120 mg Oral Daily   gabapentin  300 mg Oral Daily   And   gabapentin  600 mg Oral QHS   guaiFENesin  600 mg Oral BID   labetalol  200 mg Oral BID   mirtazapine  15 mg Oral QHS   pantoprazole  40 mg Oral Daily   polyethylene glycol  17 g Oral Daily  pravastatin  20 mg Oral QHS   [START ON 12/06/2022] predniSONE  10 mg Oral Q breakfast   [START ON 12/03/2022] predniSONE  20 mg Oral Q breakfast   predniSONE  40 mg Oral Q breakfast   revefenacin  175 mcg Nebulization Daily   senna-docusate  1 tablet Oral BID   sodium chloride flush  3 mL Intravenous Q12H   sodium chloride HYPERTONIC  4 mL Nebulization BID   Continuous Infusions:     LOS: 6 days   Shelly Coss, MD [facility] P1/10/2023, 11:20 AM

## 2022-12-01 NOTE — Progress Notes (Signed)
Rounding Note    Patient Name: [name] Date of Encounter: 12/02/2022  [place] Cardiologist: Janina Mayo, MD   Subjective   Feeling better this morning. Breathing stable. HR improved and was weaned off dilt gtt.   Inpatient Medications    Scheduled Meds:  apixaban  5 mg Oral BID   arformoterol  15 mcg Nebulization BID   budesonide (PULMICORT) nebulizer solution  0.5 mg Nebulization BID   diltiazem  240 mg Oral Daily   gabapentin  300 mg Oral Daily   And   gabapentin  600 mg Oral QHS   guaiFENesin  600 mg Oral BID   labetalol  200 mg Oral BID   mirtazapine  15 mg Oral QHS   pantoprazole  40 mg Oral Daily   polyethylene glycol  17 g Oral Daily   pravastatin  20 mg Oral QHS   [START ON 12/06/2022] predniSONE  10 mg Oral Q breakfast   [START ON 12/03/2022] predniSONE  20 mg Oral Q breakfast   revefenacin  175 mcg Nebulization Daily   senna-docusate  1 tablet Oral BID   sodium chloride flush  3 mL Intravenous Q12H   spironolactone  25 mg Oral Daily   Continuous Infusions:  amiodarone 30 mg/hr (12/02/22 0840)   PRN Meds: acetaminophen **OR** acetaminophen, albuterol, diltiazem, ipratropium-albuterol, ondansetron **OR** ondansetron (ZOFRAN) IV   Vital Signs    Vitals:   12/01/22 2143 12/01/22 2200 12/02/22 0649 12/02/22 0657  BP: 127/74 (!) 143/93    Pulse:  63  77  Resp:  20  19  Temp: 98 F (36.7 C)  97.7 F (36.5 C)   TempSrc: Oral  Oral   SpO2:  95%  93%  Weight:      Height:        Intake/Output Summary (Last 24 hours) at 12/02/2022 0915 Last data filed at 12/02/2022 0649 Gross per 24 hour  Intake 1094.87 ml  Output 950 ml  Net 144.87 ml      12/01/2022    4:13 AM 11/29/2022    5:58 AM 11/28/2022    2:41 AM  Last 3 Weights  Weight (lbs) 191 lb 6.4 oz 197 lb 1.5 oz 194 lb 14.4 oz  Weight (kg) 86.818 kg 89.4 kg 88.406 kg      Telemetry    Afib with HR 80-100s - Personally Reviewed  ECG    No new tracing - Personally  Reviewed  Physical Exam   GEN: Elderly female, comfortable, sitting in chair  Neck: No JVD Cardiac: Irregular, 2/6 systolic murmur Respiratory: Rhonchorous throughout. GI: Soft, nontender, non-distended  MS: No edema; No deformity. Neuro:  Nonfocal  Psych: Normal affect   Labs    High Sensitivity Troponin:  No results for input(s): "TROPONINIHS" in the last 720 hours.   Chemistry Recent Labs  Lab 11/26/22 0534 11/27/22 0507 11/28/22 0507 11/29/22 0518 12/01/22 0706  NA 128*   < > 132* 137 138  K 4.8   < > 5.3* 5.0 4.4  CL 96*   < > 98 101 98  CO2 23   < > 28 28 31   GLUCOSE 171*   < > 141* 144* 98  BUN 18   < > 32* 29* 26*  CREATININE 0.70   < > 0.77 0.82 0.82  CALCIUM 9.3   < > 8.9 9.0 9.1  MG 2.1  --   --   --   --   GFRNONAA >60   < > >  60 >60 >60  ANIONGAP 9   < > 6 8 9    < > = values in this interval not displayed.    Lipids No results for input(s): "CHOL", "TRIG", "HDL", "LABVLDL", "LDLCALC", "CHOLHDL" in the last 168 hours.  Hematology Recent Labs  Lab 11/30/22 0450 12/01/22 0706 12/02/22 0543  WBC 16.1* 16.4* 18.6*  RBC 4.08 4.41 4.24  HGB 11.3* 12.4 11.8*  HCT 35.8* 38.6 37.6  MCV 87.7 87.5 88.7  MCH 27.7 28.1 27.8  MCHC 31.6 32.1 31.4  RDW 14.0 14.2 14.2  PLT 419* 418* 399   Thyroid  No results for input(s): "TSH", "FREET4" in the last 168 hours.  BNP No results for input(s): "BNP", "PROBNP" in the last 168 hours.  DDimer No results for input(s): "DDIMER" in the last 168 hours.   Radiology    No results found.  Cardiac Studies   TTE 09/27/22: IMPRESSIONS     1. Left ventricular ejection fraction, by estimation, is 60 to 65%. The  left ventricle has normal function. The left ventricle has no regional  wall motion abnormalities. Left ventricular diastolic parameters were  normal.   2. Right ventricular systolic function is normal. The right ventricular  size is normal. There is moderately elevated pulmonary artery systolic  pressure.    3. Left atrial size was moderately dilated.   4. The mitral valve is normal in structure. Trivial mitral valve  regurgitation. No evidence of mitral stenosis. Severe mitral annular  calcification.   5. The aortic valve is tricuspid. There is moderate calcification of the  aortic valve. Aortic valve regurgitation is not visualized. Aortic valve  sclerosis/calcification is present, without any evidence of aortic  stenosis.   6. The inferior vena cava is normal in size with greater than 50%  respiratory variability, suggesting right atrial pressure of 3 mmHg.   Patient Profile     89 y.o. female with history of paroxysmal atrial fibrillation on Eliquis, chronic diastolic CHF, hypertension, hyperlipidemia, TIAs, COPD, GERD, and diverticulitis who presented with influenza B and COPD exacerbation with course complicated by Afib with RVR for which Cardiology was consulted.   Assessment & Plan    #Paroxysmal Afib: CHADs-vasc 6. Patient with known history of Afib. She was admitted with acute hypoxic respiratory failure in the setting of influenza B and an acute COPD exacerbation. Her course has been complicated by Afib with RVR. Was placed on dilt gtt and ultimately transitioned to PO but developed recurrent RVR on 1/12 and was placed back on dilt gtt and amiodarone for additional rate control. Will stop dilt gtt today and start PO dilt with breakthrough dosing as needed. Continue amiodarone gtt today. Hopefully can change to PO amiodarone tomorrow. -Continue amiodarone gtt today; hopefully can change to PO tomorrow -Off dilt gtt; will start dilt 240mg  daily with dilt 30mg  q8prn for HR>120s -If needed, can add dig for additional rate control; avoiding BB due to acute COPD exacerbation -Will plan for short course of amiodarone to help with rate/rhythm control as not a good long term option in the setting of underlying COPD -Continue apixaban 5mg  BID for [facility] -If remains in Afib despite improvement  from a respiratory standpoint, can consider DCCV in the future  #Chronic Diastolic HF: Echo in 09/2022 showed LVEF of 60-65% with no regional wall motion abnormalities, normal RV, left atrial enlargement, and moderately elevated PASP. Appears euvolemic on exam. Currently holding home lasix.  -Continue home spironolactone 25mg  daily -Holding home lasix  #Acute Hypoxic  Respiratory Failure: #Acute COPD Exacerbation: #Influenza B: -Management per primary       For questions or updates, please contact [place] Please consult [email or web address] for contact info under        Signed, Freada Bergeron, MD  12/02/2022, 9:15 AM

## 2022-12-01 NOTE — Progress Notes (Signed)
Rounding Note    Patient Name: [name] Date of Encounter: 12/01/2022  [facility] Cardiologist: Maisie Fus, MD   Subjective   Patient states she continues to feel weak. Breathing is stable. No chest pain or palpitations.  Dilt gtt resumed last night due to persistent Afib with RVR   Inpatient Medications    Scheduled Meds:  apixaban  5 mg Oral BID   arformoterol  15 mcg Nebulization BID   budesonide (PULMICORT) nebulizer solution  0.5 mg Nebulization BID   gabapentin  300 mg Oral Daily   And   gabapentin  600 mg Oral QHS   guaiFENesin  600 mg Oral BID   labetalol  200 mg Oral BID   mirtazapine  15 mg Oral QHS   pantoprazole  40 mg Oral Daily   polyethylene glycol  17 g Oral Daily   pravastatin  20 mg Oral QHS   [START ON 12/06/2022] predniSONE  10 mg Oral Q breakfast   [START ON 12/03/2022] predniSONE  20 mg Oral Q breakfast   predniSONE  40 mg Oral Q breakfast   revefenacin  175 mcg Nebulization Daily   senna-docusate  1 tablet Oral BID   sodium chloride flush  3 mL Intravenous Q12H   sodium chloride HYPERTONIC  4 mL Nebulization BID   Continuous Infusions:  diltiazem (CARDIZEM) infusion 7.5 mg/hr (11/30/22 2025)   PRN Meds: acetaminophen **OR** acetaminophen, albuterol, ipratropium-albuterol, ondansetron **OR** ondansetron (ZOFRAN) IV   Vital Signs    Vitals:   12/01/22 0201 12/01/22 0413 12/01/22 0416 12/01/22 0700  BP: 129/75  119/73 128/81  Pulse: 91  89 86  Resp: 19  16 18   Temp: 98 F (36.7 C)  98.4 F (36.9 C)   TempSrc: Oral  Axillary   SpO2: 94%  94% 94%  Weight:  86.8 kg    Height:        Intake/Output Summary (Last 24 hours) at 12/01/2022 0748 Last data filed at 12/01/2022 0413 Gross per 24 hour  Intake 909.94 ml  Output 1750 ml  Net -840.06 ml      12/01/2022    4:13 AM 11/29/2022    5:58 AM 11/28/2022    2:41 AM  Last 3 Weights  Weight (lbs) 191 lb 6.4 oz 197 lb 1.5 oz 194 lb 14.4 oz  Weight (kg) 86.818 kg 89.4  kg 88.406 kg      Telemetry    Afib with HR 80-100s - Personally Reviewed  ECG    No new tracing - Personally Reviewed  Physical Exam   GEN: Elderly female, NAD   Neck: No JVD Cardiac: Irregular, 2/6 systolic murmur Respiratory: Rhonchorous throughout GI: Soft, nontender, non-distended  MS: No edema; No deformity. Neuro:  Nonfocal  Psych: Normal affect   Labs    High Sensitivity Troponin:  No results for input(s): "TROPONINIHS" in the last 720 hours.   Chemistry Recent Labs  Lab 11/25/22 0548 11/26/22 0534 11/27/22 0507 11/28/22 0507 11/29/22 0518  NA 124* 128* 131* 132* 137  K 4.9  4.9 4.8 5.0 5.3* 5.0  CL 92* 96* 98 98 101  CO2 19* 23 25 28 28   GLUCOSE 138* 171* 158* 141* 144*  BUN 14 18 27* 32* 29*  CREATININE 0.62 0.70 0.88 0.77 0.82  CALCIUM 9.1 9.3 9.0 8.9 9.0  MG 1.3* 2.1  --   --   --   GFRNONAA >60 >60 >60 >60 >60  ANIONGAP 13 9 8 6  8  Lipids No results for input(s): "CHOL", "TRIG", "HDL", "LABVLDL", "LDLCALC", "CHOLHDL" in the last 168 hours.  Hematology Recent Labs  Lab 11/29/22 0518 11/30/22 0450 12/01/22 0706  WBC 15.8* 16.1* 16.4*  RBC 4.10 4.08 4.41  HGB 11.2* 11.3* 12.4  HCT 36.1 35.8* 38.6  MCV 88.0 87.7 87.5  MCH 27.3 27.7 28.1  MCHC 31.0 31.6 32.1  RDW 14.1 14.0 14.2  PLT 446* 419* 418*   Thyroid  Recent Labs  Lab 11/25/22 0548  TSH 0.666    BNP Recent Labs  Lab 11/24/22 2117  BNP 390.2*    DDimer No results for input(s): "DDIMER" in the last 168 hours.   Radiology    No results found.  Cardiac Studies   TTE 09/27/22: IMPRESSIONS     1. Left ventricular ejection fraction, by estimation, is 60 to 65%. The  left ventricle has normal function. The left ventricle has no regional  wall motion abnormalities. Left ventricular diastolic parameters were  normal.   2. Right ventricular systolic function is normal. The right ventricular  size is normal. There is moderately elevated pulmonary artery systolic   pressure.   3. Left atrial size was moderately dilated.   4. The mitral valve is normal in structure. Trivial mitral valve  regurgitation. No evidence of mitral stenosis. Severe mitral annular  calcification.   5. The aortic valve is tricuspid. There is moderate calcification of the  aortic valve. Aortic valve regurgitation is not visualized. Aortic valve  sclerosis/calcification is present, without any evidence of aortic  stenosis.   6. The inferior vena cava is normal in size with greater than 50%  respiratory variability, suggesting right atrial pressure of 3 mmHg.   Patient Profile     89 y.o. female with history of paroxysmal atrial fibrillation on Eliquis, chronic diastolic CHF, hypertension, hyperlipidemia, TIAs, COPD, GERD, and diverticulitis who presented with influenza B and COPD exacerbation with course complicated by Afib with RVR for which Cardiology was consulted.   Assessment & Plan    #Paroxysmal Afib: CHADs-vasc 6. Patient with known history of Afib. She was admitted with acute hypoxic respiratory failure in the setting of influenza B and an acute COPD exacerbation. Her course has been complicated by Afib with RVR. Was placed on dilt gtt and ultimately transitioned to PO but developed recurrent RVR on 1/12 and was placed back on dilt gtt. Currently, HR much better controlled in the 80-100s. Overall, suspect rates will continue to improve with improvement of respiratory status. Given difficulty with rate control with PO medications, will plan to load with amiodarone today and transition to PO as able. Hopefully, we can control her rates with PO amio and dilt. Can consider DCCV in the future as she recovers from her acute illness if remains in Afib. -Start IV amiodarone load and wean dilt gtt as able -Hopefully, can manage to rate control with PO dilt and amio going forward -Will plan for short course of amiodarone to help with rate/rhythm control as not a good long term option  in the setting of underlying COPD -Continue apixaban 5mg  BID for [facility] -If remains in Afib despite improvement from a respiratory standpoint, can consider DCCV in the future  #Chronic Diastolic HF: Echo in 64/3329 showed LVEF of 60-65% with no regional wall motion abnormalities, normal RV, left atrial enlargement, and moderately elevated PASP. Appears euvolemic on exam. Currently holding home lasix. Will resume home spironolactone. -Resume home spironolactone -Holding home lasix  #Acute Hypoxic Respiratory Failure: #Acute COPD Exacerbation: #  Influenza B: -Management per primary       For questions or updates, please contact [place] Please consult [email or web address] for contact info under        Signed, Freada Bergeron, MD  12/01/2022, 7:48 AM

## 2022-12-02 ENCOUNTER — Inpatient Hospital Stay (HOSPITAL_COMMUNITY): Payer: Medicare Other

## 2022-12-02 DIAGNOSIS — J9601 Acute respiratory failure with hypoxia: Secondary | ICD-10-CM | POA: Diagnosis not present

## 2022-12-02 DIAGNOSIS — J441 Chronic obstructive pulmonary disease with (acute) exacerbation: Secondary | ICD-10-CM | POA: Diagnosis not present

## 2022-12-02 DIAGNOSIS — I10 Essential (primary) hypertension: Secondary | ICD-10-CM | POA: Diagnosis not present

## 2022-12-02 DIAGNOSIS — I4891 Unspecified atrial fibrillation: Secondary | ICD-10-CM | POA: Diagnosis not present

## 2022-12-02 LAB — CBC
HCT: 37.6 % (ref 36.0–46.0)
Hemoglobin: 11.8 g/dL — ABNORMAL LOW (ref 12.0–15.0)
MCH: 27.8 pg (ref 26.0–34.0)
MCHC: 31.4 g/dL (ref 30.0–36.0)
MCV: 88.7 fL (ref 80.0–100.0)
Platelets: 399 10*3/uL (ref 150–400)
RBC: 4.24 MIL/uL (ref 3.87–5.11)
RDW: 14.2 % (ref 11.5–15.5)
WBC: 18.6 10*3/uL — ABNORMAL HIGH (ref 4.0–10.5)
nRBC: 0 % (ref 0.0–0.2)

## 2022-12-02 MED ORDER — DILTIAZEM HCL 30 MG PO TABS: 30.0000 mg | ORAL_TABLET | Freq: Three times a day (TID) | ORAL | Status: DC | PRN

## 2022-12-02 MED ORDER — DILTIAZEM HCL ER COATED BEADS 240 MG PO CP24
240.0000 mg | ORAL_CAPSULE | Freq: Every day | ORAL | Status: DC
  Administered 2022-12-02 – 2022-12-04 (×3): 240 mg via ORAL
  Filled 2022-12-02 (×3): qty 1

## 2022-12-02 NOTE — Progress Notes (Signed)
PROGRESS NOTE  [name]  WCB:[id number] DOB: 02-03-1933 DOA: 11/24/2022 PCP: Myrlene Broker, MD   Brief Narrative: Patient is a 89 year old female with history of paroxysmal A-fib on Eliquis, COPD not on supplemental oxygen at home, TIA, hypertension, hyperlipidemia who presented to the emergency room with complaints of shortness of breath, fast heartbeat.  She was having flulike symptoms for a week and was tested positive for influenza B as an outpatient.  Chest x-ray had shown right upper lobe infiltrate was prescribed antibiotic but she had progressive worsening shortness of breath, cough.  On presentation, she was in A-fib with RVR.  She was saturating 88% on room air.  She had to be   placed on 2 L of oxygen per minute.  Chest x-ray showed moderately severe right upper lobe infiltrate, emphysematous changes.  Patient is currently being managed for COPD exacerbation, pneumonia, A-fib with RVR.  Cardiology,PCCM were consulted.  Assessment & Plan:  Principal Problem:   Acute respiratory failure with hypoxia (HCC) Active Problems:   Right upper lobe pneumonia   COPD with acute exacerbation (HCC)   Paroxysmal atrial fibrillation (HCC)   Influenza B   Hyponatremia   Essential hypertension   Hyperlipidemia   Atrial fibrillation with RVR (HCC)   Acute respiratory failure with hypoxia/COPD exacerbation: Secondary to pneumonia, COPD exacerbation, flu.  Not on oxygen at home.  Hypoxic on arrival.  Continue steroids, bronchodilators.  Continue flutter valve, incentive centimeter  PCCM consulted , recommended to continue current management.  Added hypertonic saline.  On discharge, she has been recommended to continue Brovana, Pulmicort, as needed DuoNeb.  She was offered to set up follow-up appointment at pulmonary clinic but family declined.   Since 1/11, she has been weaned to room air.  No significant wheezing today, on oral steroids now.  Checking follow-up chest x-ray today.  Right  upper lobe pneumonia: Diagnosed with pneumonia as an outpatient, was on oral antibiotics for that did not help.She completed the course of ceftriaxone, azithro.  Cultures have been negative so far.  Strep pneumoniae negative, Legionella antigen negative. CT chest without contrast done on 1/7 showed extensive heterogenous, groundglass airspace opacity predominantly in the right upper lobe consistent with multifocal infection, pulmonary edema.  Also multiple calcified mediastinal, right hilar nodes consistent with  sequela of prior granulomatous infection. Developed  leukocytosis , most likely contributed by steroids.  Continue to monitor.  Checking follow-up chest x-ray  Influenza B: Diagnosed as an outpatient.  Finished the course of  Tamiflu  A-fib with RVR:  On Eliquis for anticoagulation.Was following with EP as an outpatient.  Hospital course remarkable for persistent A-fib with RVR .cardiology consulted and following.  Was changed to Cardizem oral on 1/12 but again went to A-fib with RVR so restarted on Cardizem drip.Started on amiodarone drip IV today, plan for short course.  Cardizem changed to oral.  Remains in A-fib with RVR this morning  Hyperlipidemia: Continue statin  Hypertension: On benazepril, amlodipine ,labetelol at home.BP stable  Obesity: BMI of 32.9.  Debility/deconditioning: Home health recommended by PT/OT        DVT prophylaxis: apixaban (ELIQUIS) tablet 5 mg     Code Status: Full Code  Family Communication: Discussed with daughter at bedside on 1/12  Patient status:Inpatient  Patient is from :Home  Anticipated discharge [place]:[facility]   Estimated DC date: In 1-2 days.  Waiting for heart rate to be better before discharge   Consultants: Cardiology, pulmonology  Procedures:None  Antimicrobials:  Anti-infectives (From admission,  onward)    Start     Dose/Rate Route Frequency Ordered Stop   11/25/22 2300  azithromycin (ZITHROMAX) 500 mg in sodium chloride 0.9  % 250 mL IVPB        500 mg 250 mL/hr over 60 Minutes Intravenous Every 24 hours 11/24/22 2349 11/29/22 2349   11/25/22 2200  cefTRIAXone (ROCEPHIN) 2 g in sodium chloride 0.9 % 100 mL IVPB        2 g 200 mL/hr over 30 Minutes Intravenous Every 24 hours 11/24/22 2349 11/29/22 2158   11/25/22 1000  oseltamivir (TAMIFLU) capsule 30 mg        30 mg Oral 2 times daily 11/25/22 0024 11/29/22 0938   11/25/22 0030  oseltamivir (TAMIFLU) capsule 75 mg        75 mg Oral NOW 11/24/22 2349 11/25/22 0158   11/24/22 2245  levofloxacin (LEVAQUIN) IVPB 750 mg  Status:  Discontinued        750 mg 100 mL/hr over 90 Minutes Intravenous Every 24 hours 11/24/22 2231 11/24/22 2349       Subjective:  Patient seen and examined at bedside today.  Hemodynamically stable.  Remains in A-fib with heart rate in the range of 100-120.  On room air.  Has some cough.  Denies any worsening shortness of breath.  Objective: Vitals:   12/01/22 2200 12/02/22 0649 12/02/22 0657 12/02/22 0942  BP: (!) 143/93   126/78  Pulse: 63  77 (!) 114  Resp: 20  19 20   Temp:  97.7 F (36.5 C)  97.7 F (36.5 C)  TempSrc:  Oral  Axillary  SpO2: 95%  93% 97%  Weight:      Height:        Intake/Output Summary (Last 24 hours) at 12/02/2022 1037 Last data filed at 12/02/2022 0955 Gross per 24 hour  Intake 854.87 ml  Output 950 ml  Net -95.13 ml   Filed Weights   11/28/22 0241 11/29/22 0558 12/01/22 0413  Weight: 88.4 kg 89.4 kg 86.8 kg    Examination:    General exam: Overall comfortable, not in distress, pleasant elderly female HEENT: PERRL Respiratory system: Mild air sounds bilaterally, scattered rhonchi, few wheezing  cardiovascular system: Irregular regular rhythm Gastrointestinal system: Abdomen is nondistended, soft and nontender. Central nervous system: Alert and oriented Extremities: No edema, no clubbing ,no cyanosis Skin: No rashes, no ulcers,no icterus       Data Reviewed: I have personally reviewed  following labs and imaging studies  CBC: Recent Labs  Lab 11/28/22 0507 11/29/22 0518 11/30/22 0450 12/01/22 0706 12/02/22 0543  WBC 14.9* 15.8* 16.1* 16.4* 18.6*  HGB 10.8* 11.2* 11.3* 12.4 11.8*  HCT 33.7* 36.1 35.8* 38.6 37.6  MCV 87.8 88.0 87.7 87.5 88.7  PLT 417* 446* 419* 418* 063   Basic Metabolic Panel: Recent Labs  Lab 11/26/22 0534 11/27/22 0507 11/28/22 0507 11/29/22 0518 12/01/22 0706  NA 128* 131* 132* 137 138  K 4.8 5.0 5.3* 5.0 4.4  CL 96* 98 98 101 98  CO2 23 25 28 28 31   GLUCOSE 171* 158* 141* 144* 98  BUN 18 27* 32* 29* 26*  CREATININE 0.70 0.88 0.77 0.82 0.82  CALCIUM 9.3 9.0 8.9 9.0 9.1  MG 2.1  --   --   --   --      Recent Results (from the past 240 hour(s))  Resp panel by RT-PCR (RSV, Flu A&B, Covid) Anterior Nasal Swab     Status: Abnormal  Collection Time: 11/24/22 11:08 PM   Specimen: Anterior Nasal Swab  Result Value Ref Range Status   SARS Coronavirus 2 by RT PCR NEGATIVE NEGATIVE Final    Comment: (NOTE) SARS-CoV-2 target nucleic acids are NOT DETECTED.  The SARS-CoV-2 RNA is generally detectable in upper respiratory specimens during the acute phase of infection. The lowest concentration of SARS-CoV-2 viral copies this assay can detect is 138 copies/mL. A negative result does not preclude SARS-Cov-2 infection and should not be used as the sole basis for treatment or other patient management decisions. A negative result may occur with  improper specimen collection/handling, submission of specimen other than nasopharyngeal swab, presence of viral mutation(s) within the areas targeted by this assay, and inadequate number of viral copies(<138 copies/mL). A negative result must be combined with clinical observations, patient history, and epidemiological information. The expected result is Negative.  Fact Sheet for Patients:  BloggerCourse.com  Fact Sheet for Healthcare Providers:   SeriousBroker.it  This test is no t yet approved or cleared by the [place] FDA and  has been authorized for detection and/or diagnosis of SARS-CoV-2 by FDA under an Emergency Use Authorization (EUA). This EUA will remain  in effect (meaning this test can be used) for the duration of the COVID-19 declaration under Section 564(b)(1) of the Act, 21 U.S.C.section 360bbb-3(b)(1), unless the authorization is terminated  or revoked sooner.       Influenza A by PCR NEGATIVE NEGATIVE Final   Influenza B by PCR POSITIVE (A) NEGATIVE Final    Comment: (NOTE) The Xpert Xpress SARS-CoV-2/FLU/RSV plus assay is intended as an aid in the diagnosis of influenza from Nasopharyngeal swab specimens and should not be used as a sole basis for treatment. Nasal washings and aspirates are unacceptable for Xpert Xpress SARS-CoV-2/FLU/RSV testing.  Fact Sheet for Patients: BloggerCourse.com  Fact Sheet for Healthcare Providers: SeriousBroker.it  This test is not yet approved or cleared by the [place] FDA and has been authorized for detection and/or diagnosis of SARS-CoV-2 by FDA under an Emergency Use Authorization (EUA). This EUA will remain in effect (meaning this test can be used) for the duration of the COVID-19 declaration under Section 564(b)(1) of the Act, 21 U.S.C. section 360bbb-3(b)(1), unless the authorization is terminated or revoked.     Resp Syncytial Virus by PCR NEGATIVE NEGATIVE Final    Comment: (NOTE) Fact Sheet for Patients: BloggerCourse.com  Fact Sheet for Healthcare Providers: SeriousBroker.it  This test is not yet approved or cleared by the [place] FDA and has been authorized for detection and/or diagnosis of SARS-CoV-2 by FDA under an Emergency Use Authorization (EUA). This EUA will remain in effect (meaning this test can be used)  for the duration of the COVID-19 declaration under Section 564(b)(1) of the Act, 21 U.S.C. section 360bbb-3(b)(1), unless the authorization is terminated or revoked.  Performed at [facility], [address], [place], [place] [address]      Radiology Studies: No results found.  Scheduled Meds:  apixaban  5 mg Oral BID   arformoterol  15 mcg Nebulization BID   budesonide (PULMICORT) nebulizer solution  0.5 mg Nebulization BID   diltiazem  240 mg Oral Daily   gabapentin  300 mg Oral Daily   And   gabapentin  600 mg Oral QHS   guaiFENesin  600 mg Oral BID   labetalol  200 mg Oral BID   mirtazapine  15 mg Oral QHS   pantoprazole  40 mg Oral Daily  polyethylene glycol  17 g Oral Daily   pravastatin  20 mg Oral QHS   [START ON 12/06/2022] predniSONE  10 mg Oral Q breakfast   [START ON 12/03/2022] predniSONE  20 mg Oral Q breakfast   revefenacin  175 mcg Nebulization Daily   senna-docusate  1 tablet Oral BID   sodium chloride flush  3 mL Intravenous Q12H   spironolactone  25 mg Oral Daily   Continuous Infusions:  amiodarone 30 mg/hr (12/02/22 0840)      LOS: 8 days   Burnadette Pop, MD [facility] P1/14/2024, 10:37 AM

## 2022-12-03 DIAGNOSIS — I4891 Unspecified atrial fibrillation: Secondary | ICD-10-CM | POA: Diagnosis not present

## 2022-12-03 DIAGNOSIS — J9601 Acute respiratory failure with hypoxia: Secondary | ICD-10-CM | POA: Diagnosis not present

## 2022-12-03 LAB — BASIC METABOLIC PANEL
Anion gap: 7 (ref 5–15)
BUN: 29 mg/dL — ABNORMAL HIGH (ref 8–23)
CO2: 27 mmol/L (ref 22–32)
Calcium: 8.3 mg/dL — ABNORMAL LOW (ref 8.9–10.3)
Chloride: 101 mmol/L (ref 98–111)
Creatinine, Ser: 0.81 mg/dL (ref 0.44–1.00)
GFR, Estimated: >60 mL/min (ref >60)
Glucose, Bld: 87 mg/dL (ref 70–99)
Potassium: 4.2 mmol/L (ref 3.5–5.1)
Sodium: 135 mmol/L (ref 135–145)

## 2022-12-03 LAB — CBC
HCT: 34.9 % — ABNORMAL LOW (ref 36.0–46.0)
Hemoglobin: 10.9 g/dL — ABNORMAL LOW (ref 12.0–15.0)
MCH: 27.9 pg (ref 26.0–34.0)
MCHC: 31.2 g/dL (ref 30.0–36.0)
MCV: 89.3 fL (ref 80.0–100.0)
Platelets: 349 10*3/uL (ref 150–400)
RBC: 3.91 MIL/uL (ref 3.87–5.11)
RDW: 14.5 % (ref 11.5–15.5)
WBC: 14.8 10*3/uL — ABNORMAL HIGH (ref 4.0–10.5)
nRBC: 0 % (ref 0.0–0.2)

## 2022-12-03 MED ORDER — AMIODARONE HCL 200 MG PO TABS
200.0000 mg | ORAL_TABLET | Freq: Two times a day (BID) | ORAL | Status: DC
  Administered 2022-12-03 – 2022-12-04 (×3): 200 mg via ORAL
  Filled 2022-12-03 (×3): qty 1

## 2022-12-03 MED ORDER — AMIODARONE HCL 200 MG PO TABS: 200.0000 mg | ORAL_TABLET | Freq: Every day | ORAL | Status: DC

## 2022-12-03 MED ORDER — AMIODARONE HCL 200 MG PO TABS: 400.0000 mg | ORAL_TABLET | Freq: Every day | ORAL | Status: DC

## 2022-12-03 MED ORDER — AMIODARONE HCL 200 MG PO TABS: 200.0000 mg | ORAL_TABLET | Freq: Two times a day (BID) | ORAL | Status: DC

## 2022-12-03 NOTE — Progress Notes (Signed)
PROGRESS NOTE  [name]  VQQ:[id number] DOB: [birth year] DOA: 11/24/2022 PCP: Myrlene Broker, MD   Brief Narrative: Patient is a 89 year old female with history of paroxysmal A-fib on Eliquis, COPD not on supplemental oxygen at home, TIA, hypertension, hyperlipidemia who presented to the emergency room with complaints of shortness of breath, fast heartbeat.  She was having flulike symptoms for a week and was tested positive for influenza B as an outpatient.  Chest x-ray had shown right upper lobe infiltrate was prescribed antibiotic but she had progressive worsening shortness of breath, cough.  On presentation, she was in A-fib with RVR.  She was saturating 88% on room air.  She had to be   placed on 2 L of oxygen per minute.  Chest x-ray showed moderately severe right upper lobe infiltrate, emphysematous changes.  Patient is currently being managed for COPD exacerbation, pneumonia, A-fib with RVR.  Cardiology,PCCM were consulted.  Currently she is on room air, heart rate is stable after restarting on amiodarone.  Plan for discharge tomorrow.  Assessment & Plan:  Principal Problem:   Acute respiratory failure with hypoxia (HCC) Active Problems:   Right upper lobe pneumonia   COPD with acute exacerbation (HCC)   Paroxysmal atrial fibrillation (HCC)   Influenza B   Hyponatremia   Essential hypertension   Hyperlipidemia   Atrial fibrillation with RVR (HCC)   Acute respiratory failure with hypoxia/COPD exacerbation: Secondary to pneumonia, COPD exacerbation, flu.  Not on oxygen at home.  Hypoxic on arrival.  Continue steroids, bronchodilators.  Continue flutter valve, incentive centimeter  PCCM consulted , recommended to continue current management.  Added hypertonic saline.  On discharge, she has been recommended to continue Brovana, Pulmicort, as needed DuoNeb.  She was offered to set up follow-up appointment at pulmonary clinic but family declined.   Since 1/11, she has been weaned to  room air.  No significant wheezing today, on oral steroids now.  Follow-up as chest x-ray on 1/14 showed improvement, no new infiltrates.  Right upper lobe pneumonia: Diagnosed with pneumonia as an outpatient, was on oral antibiotics for that did not help.She completed the course of ceftriaxone, azithro.  Cultures have been negative so far.  Strep pneumoniae negative, Legionella antigen negative. CT chest without contrast done on 1/7 showed extensive heterogenous, groundglass airspace opacity predominantly in the right upper lobe consistent with multifocal infection, pulmonary edema.  Also multiple calcified mediastinal, right hilar nodes consistent with  sequela of prior granulomatous infection. Developed  leukocytosis , most likely contributed by steroids.  Influenza B: Diagnosed as an outpatient.  Finished the course of  Tamiflu  A-fib with RVR:  On Eliquis for anticoagulation.Was following with EP as an outpatient.  Hospital course remarkable for persistent A-fib with RVR .cardiology consulted and following.  Was changed to Cardizem oral on 1/12 but again went to A-fib with RVR so restarted on Cardizem drip.Started on amiodarone drip finally improving the heart rate.  Currently on oral Cardizem and oral amiodarone.  She will follow-up with A-fib clinic as an outpatient.  Hyperlipidemia: Continue statin  Hypertension: On benazepril, amlodipine ,labetelol at home.BP stable  Obesity: BMI of 32.9.  Debility/deconditioning: Home health recommended by PT/OT        DVT prophylaxis: apixaban (ELIQUIS) tablet 5 mg     Code Status: Full Code  Family Communication: Discussed with daughter at bedside   Patient status:Inpatient  Patient is from :Home  Anticipated discharge EP:[facility]   Estimated DC date: Tomorrow  Consultants: Cardiology, pulmonology  Procedures:None  Antimicrobials:  Anti-infectives (From admission, onward)    Start     Dose/Rate Route Frequency Ordered Stop    11/25/22 2300  azithromycin (ZITHROMAX) 500 mg in sodium chloride 0.9 % 250 mL IVPB        500 mg 250 mL/hr over 60 Minutes Intravenous Every 24 hours 11/24/22 2349 11/29/22 2349   11/25/22 2200  cefTRIAXone (ROCEPHIN) 2 g in sodium chloride 0.9 % 100 mL IVPB        2 g 200 mL/hr over 30 Minutes Intravenous Every 24 hours 11/24/22 2349 11/29/22 2158   11/25/22 1000  oseltamivir (TAMIFLU) capsule 30 mg        30 mg Oral 2 times daily 11/25/22 0024 11/29/22 0938   11/25/22 0030  oseltamivir (TAMIFLU) capsule 75 mg        75 mg Oral NOW 11/24/22 2349 11/25/22 0158   11/24/22 2245  levofloxacin (LEVAQUIN) IVPB 750 mg  Status:  Discontinued        750 mg 100 mL/hr over 90 Minutes Intravenous Every 24 hours 11/24/22 2231 11/24/22 2349       Subjective:  Seen and examined at bedside today and hemodynamically stable.  On normal sinus rhythm on monitor.  Heart rate well-controlled.  On room air.  Objective: Vitals:   12/03/22 0628 12/03/22 0629 12/03/22 0630 12/03/22 1043  BP:    134/64  Pulse:    69  Resp: 20 (!) 21 18   Temp:      TempSrc:      SpO2:      Weight:      Height:        Intake/Output Summary (Last 24 hours) at 12/03/2022 1148 Last data filed at 12/03/2022 0600 Gross per 24 hour  Intake 591.89 ml  Output --  Net 591.89 ml   Filed Weights   11/28/22 0241 11/29/22 0558 12/01/22 0413  Weight: 88.4 kg 89.4 kg 86.8 kg    Examination:    General exam: Overall comfortable, not in distress HEENT: PERRL Respiratory system: Mild scattered rhonchi, diminished sounds Cardiovascular system: Normal sinus rhythm  gastrointestinal system: Abdomen is nondistended, soft and nontender. Central nervous system: Alert and oriented Extremities: No edema, no clubbing ,no cyanosis Skin: No rashes, no ulcers,no icterus       Data Reviewed: I have personally reviewed following labs and imaging studies  CBC: Recent Labs  Lab 11/29/22 0518 11/30/22 0450 12/01/22 0706  12/02/22 0543 12/03/22 0547  WBC 15.8* 16.1* 16.4* 18.6* 14.8*  HGB 11.2* 11.3* 12.4 11.8* 10.9*  HCT 36.1 35.8* 38.6 37.6 34.9*  MCV 88.0 87.7 87.5 88.7 89.3  PLT 446* 419* 418* 399 349   Basic Metabolic Panel: Recent Labs  Lab 11/27/22 0507 11/28/22 0507 11/29/22 0518 12/01/22 0706 12/03/22 0547  NA 131* 132* 137 138 135  K 5.0 5.3* 5.0 4.4 4.2  CL 98 98 101 98 101  CO2 25 28 28 31 27   GLUCOSE 158* 141* 144* 98 87  BUN 27* 32* 29* 26* 29*  CREATININE 0.88 0.77 0.82 0.82 0.81  CALCIUM 9.0 8.9 9.0 9.1 8.3*     Recent Results (from the past 240 hour(s))  Resp panel by RT-PCR (RSV, Flu A&B, Covid) Anterior Nasal Swab     Status: Abnormal   Collection Time: 11/24/22 11:08 PM   Specimen: Anterior Nasal Swab  Result Value Ref Range Status   SARS Coronavirus 2 by RT PCR NEGATIVE NEGATIVE Final    Comment: (NOTE) SARS-CoV-2 target  nucleic acids are NOT DETECTED.  The SARS-CoV-2 RNA is generally detectable in upper respiratory specimens during the acute phase of infection. The lowest concentration of SARS-CoV-2 viral copies this assay can detect is 138 copies/mL. A negative result does not preclude SARS-Cov-2 infection and should not be used as the sole basis for treatment or other patient management decisions. A negative result may occur with  improper specimen collection/handling, submission of specimen other than nasopharyngeal swab, presence of viral mutation(s) within the areas targeted by this assay, and inadequate number of viral copies(<138 copies/mL). A negative result must be combined with clinical observations, patient history, and epidemiological information. The expected result is Negative.  Fact Sheet for Patients:  BloggerCourse.com  Fact Sheet for Healthcare Providers:  SeriousBroker.it  This test is no t yet approved or cleared by the [place] FDA and  has been authorized for detection and/or  diagnosis of SARS-CoV-2 by FDA under an Emergency Use Authorization (EUA). This EUA will remain  in effect (meaning this test can be used) for the duration of the COVID-19 declaration under Section 564(b)(1) of the Act, 21 U.S.C.section 360bbb-3(b)(1), unless the authorization is terminated  or revoked sooner.       Influenza A by PCR NEGATIVE NEGATIVE Final   Influenza B by PCR POSITIVE (A) NEGATIVE Final    Comment: (NOTE) The Xpert Xpress SARS-CoV-2/FLU/RSV plus assay is intended as an aid in the diagnosis of influenza from Nasopharyngeal swab specimens and should not be used as a sole basis for treatment. Nasal washings and aspirates are unacceptable for Xpert Xpress SARS-CoV-2/FLU/RSV testing.  Fact Sheet for Patients: BloggerCourse.com  Fact Sheet for Healthcare Providers: SeriousBroker.it  This test is not yet approved or cleared by the [place] FDA and has been authorized for detection and/or diagnosis of SARS-CoV-2 by FDA under an Emergency Use Authorization (EUA). This EUA will remain in effect (meaning this test can be used) for the duration of the COVID-19 declaration under Section 564(b)(1) of the Act, 21 U.S.C. section 360bbb-3(b)(1), unless the authorization is terminated or revoked.     Resp Syncytial Virus by PCR NEGATIVE NEGATIVE Final    Comment: (NOTE) Fact Sheet for Patients: BloggerCourse.com  Fact Sheet for Healthcare Providers: SeriousBroker.it  This test is not yet approved or cleared by the [place] FDA and has been authorized for detection and/or diagnosis of SARS-CoV-2 by FDA under an Emergency Use Authorization (EUA). This EUA will remain in effect (meaning this test can be used) for the duration of the COVID-19 declaration under Section 564(b)(1) of the Act, 21 U.S.C. section 360bbb-3(b)(1), unless the authorization is terminated  or revoked.  Performed at [facility], [address], [place], [place] [address]      Radiology Studies: DG CHEST PORT 1 VIEW  Result Date: 12/02/2022 CLINICAL DATA:  Leukocytosis. Cough, shortness of breath. History of flu. EXAM: PORTABLE CHEST 1 VIEW COMPARISON:  Radiograph 11/24/2022.  CT 11/25/2022 FINDINGS: Significant patient rotation. Improving patchy airspace disease in the right upper lobe. Small right pleural effusion persists, possibly improved. No new or progressive consolidation. Streaky opacities at the left lung base typical of atelectasis. Stable heart size and mediastinal contours allowing for patient rotation. Calcified mediastinal lymph nodes again seen. No pulmonary edema. No pneumothorax. IMPRESSION: Improving patchy airspace disease in the right upper lobe. Small right pleural effusion persists, possibly improved. No new or progressive consolidation. Electronically Signed   By: Narda Rutherford M.D.   On: 12/02/2022 10:51    Scheduled  Meds:  amiodarone  200 mg Oral BID   apixaban  5 mg Oral BID   arformoterol  15 mcg Nebulization BID   budesonide (PULMICORT) nebulizer solution  0.5 mg Nebulization BID   diltiazem  240 mg Oral Daily   gabapentin  300 mg Oral Daily   And   gabapentin  600 mg Oral QHS   guaiFENesin  600 mg Oral BID   labetalol  200 mg Oral BID   mirtazapine  15 mg Oral QHS   pantoprazole  40 mg Oral Daily   polyethylene glycol  17 g Oral Daily   pravastatin  20 mg Oral QHS   [START ON 12/06/2022] predniSONE  10 mg Oral Q breakfast   predniSONE  20 mg Oral Q breakfast   revefenacin  175 mcg Nebulization Daily   senna-docusate  1 tablet Oral BID   sodium chloride flush  3 mL Intravenous Q12H   spironolactone  25 mg Oral Daily   Continuous Infusions:      LOS: 9 days   Shelly Coss, MD [facility] P1/15/2024, 11:48 AM

## 2022-12-03 NOTE — Progress Notes (Signed)
Physical Therapy Treatment Patient Details Name: Katelyn [name] MRN: [id number] DOB: 07-Dec-1932 Today's Date: 12/03/2022   History of Present Illness 89 yr old female admitted with acute respiratory failure, flu, and COPD exacerbation. PMH: COVID, COPD, HTN, HLD, arthritis    PT Comments    General Comments: AxO x 3 very pleasant Lady feeling "better" and stated "I think they are letting me go home tomorrow". Resting HR 70 and RA 97%.  Assisted with amb in hallway went well.  General transfer comment: 25% VC's on proper hand placement and safety withn turn completion.  General Gait Details: tolerated an increased distance but did require a seated rest break after 20 fet.  Pt uses a Rollator at home "which has a seat". Avg HR with activity was 84 and RA 98% with mild dyspnea 1/4.  Seated rest break needed for fatigue/weakness vs dyspnea. Pt plans to return home where she lives with family.    Recommendations for follow up therapy are one component of a multi-disciplinary discharge planning process, led by the attending physician.  Recommendations may be updated based on patient status, additional functional criteria and insurance authorization.  Follow Up Recommendations  Home health PT     Assistance Recommended at Discharge    Patient can return home with the following Assistance with cooking/housework;Assist for transportation;Help with stairs or ramp for entrance;A little help with walking and/or transfers;A little help with bathing/dressing/bathroom   Equipment Recommendations  None recommended by PT    Recommendations for Other Services       Precautions / Restrictions Precautions Precaution Comments: monitor heart rate and O2 saturation Restrictions Weight Bearing Restrictions: No     Mobility  Bed Mobility               General bed mobility comments: OOB in recliner    Transfers Overall transfer level: Needs assistance Equipment used: Rolling walker (2  wheels) Transfers: Sit to/from Stand Sit to Stand: Supervision, [name] guard           General transfer comment: 25% VC's on proper hand placement and safety withn turn completion.    Ambulation/Gait Ambulation/Gait assistance: Supervision, [name] guard Gait Distance (Feet): 50 Feet (25 feet x 2 one seated rest break) Assistive device: Rolling walker (2 wheels) Gait Pattern/deviations: Step-through pattern, Decreased stride length Gait velocity: decreased     General Gait Details: tolerated an increased distance but did require a seated rest break after 20 fet.  Pt uses a Rollator at home "which has a seat".   Stairs             Wheelchair Mobility    Modified Rankin (Stroke Patients Only)       Balance                                            Cognition Arousal/Alertness: Awake/alert Behavior During Therapy: WFL for tasks assessed/performed Overall Cognitive Status: Within Functional Limits for tasks assessed                                 General Comments: AxO x 3 very pleasant Lady feeling "better" and stated "I think they are letting me go home tomorrow".        Exercises      General Comments  Pertinent Vitals/Pain Pain Assessment Pain Assessment: No/denies pain    Home Living                          Prior Function            PT Goals (current goals can now be found in the care plan section) Progress towards PT goals: Progressing toward goals    Frequency    [name] 3X/week      PT Plan Current plan remains appropriate    Co-evaluation              AM-PAC PT "6 Clicks" Mobility   Outcome Measure  Help needed turning from your back to your side while in a flat bed without using bedrails?: A Little Help needed moving from lying on your back to sitting on the side of a flat bed without using bedrails?: A Little Help needed moving to and from a bed to a chair (including a  wheelchair)?: A Little Help needed standing up from a chair using your arms (e.g., wheelchair or bedside chair)?: A Little Help needed to walk in hospital room?: A Little Help needed climbing 3-5 steps with a railing? : A Lot 6 Click Score: 17    End of Session Equipment Utilized During Treatment: Gait belt Activity Tolerance: Patient limited by fatigue Patient left: in chair;with call bell/phone within reach;with chair alarm set Nurse Communication: Mobility status PT Visit Diagnosis: Muscle weakness (generalized) (M62.81);Difficulty in walking, not elsewhere classified (R26.2)     Time: 9604-5409 PT Time Calculation ([name]) (ACUTE ONLY): 26 [name]  Charges:  $Gait Training: 8-22 mins $Therapeutic Activity: 8-22 mins                     Rica Koyanagi  PTA Acute  Rehabilitation Services Office M-F          [phone] Weekend pager [phone]

## 2022-12-03 NOTE — Plan of Care (Signed)
  Problem: Activity: Goal: Ability to tolerate increased activity will improve Outcome: Progressing Goal: Will verbalize the importance of balancing activity with adequate rest periods Outcome: Progressing   Problem: Respiratory: Goal: Ability to maintain a clear airway will improve Outcome: Progressing   Problem: Clinical Measurements: Goal: Diagnostic test results will improve Outcome: Progressing

## 2022-12-03 NOTE — Progress Notes (Signed)
Pt HR 59 BP 119/55. Notified provider. Stopped amio gtt.

## 2022-12-03 NOTE — Care Management Important Message (Signed)
Important Message  Patient Details IM Letter given. Name: [name] MRN: [id number] Date of Birth: December 30, 1932   Medicare Important Message Given:  Yes     Kerin Salen 12/03/2022, 1:32 PM

## 2022-12-03 NOTE — Progress Notes (Addendum)
Rounding Note    Patient Name: [name] Date of Encounter: 12/03/2022  [facility] Cardiologist: Maisie Fus, MD   Subjective   Patient reports that she continues to feel better every day. Denies chest pain, palpitations or racing heartbeat.   Inpatient Medications    Scheduled Meds:  amiodarone  200 mg Oral BID   apixaban  5 mg Oral BID   arformoterol  15 mcg Nebulization BID   budesonide (PULMICORT) nebulizer solution  0.5 mg Nebulization BID   diltiazem  240 mg Oral Daily   gabapentin  300 mg Oral Daily   And   gabapentin  600 mg Oral QHS   guaiFENesin  600 mg Oral BID   labetalol  200 mg Oral BID   mirtazapine  15 mg Oral QHS   pantoprazole  40 mg Oral Daily   polyethylene glycol  17 g Oral Daily   pravastatin  20 mg Oral QHS   [START ON 12/06/2022] predniSONE  10 mg Oral Q breakfast   predniSONE  20 mg Oral Q breakfast   revefenacin  175 mcg Nebulization Daily   senna-docusate  1 tablet Oral BID   sodium chloride flush  3 mL Intravenous Q12H   spironolactone  25 mg Oral Daily   Continuous Infusions:  PRN Meds: acetaminophen **OR** acetaminophen, albuterol, diltiazem, ipratropium-albuterol, ondansetron **OR** ondansetron (ZOFRAN) IV   Vital Signs    Vitals:   12/03/22 0627 12/03/22 0628 12/03/22 0629 12/03/22 0630  BP:      Pulse:      Resp: 18 20 (!) 21 18  Temp:      TempSrc:      SpO2:      Weight:      Height:        Intake/Output Summary (Last 24 hours) at 12/03/2022 0841 Last data filed at 12/03/2022 0600 Gross per 24 hour  Intake 711.89 ml  Output --  Net 711.89 ml      12/01/2022    4:13 AM 11/29/2022    5:58 AM 11/28/2022    2:41 AM  Last 3 Weights  Weight (lbs) 191 lb 6.4 oz 197 lb 1.5 oz 194 lb 14.4 oz  Weight (kg) 86.818 kg 89.4 kg 88.406 kg      Telemetry    Conversion to NSR around 2150 on 1/14. Sinus rates around 60 bpm - Personally Reviewed  ECG    No new tracing - Personally Reviewed  Physical Exam    GEN: No acute distress.   Neck: No JVD Cardiac: RRR, no murmurs, rubs, or gallops.  Respiratory: RUL rhonchi that radiates into other fields. No rales appreciated GI: Soft, nontender, non-distended  MS: No edema; No deformity. Neuro:  Nonfocal  Psych: Normal affect   Labs    High Sensitivity Troponin:  No results for input(s): "TROPONINIHS" in the last 720 hours.   Chemistry Recent Labs  Lab 11/29/22 0518 12/01/22 0706 12/03/22 0547  NA 137 138 135  K 5.0 4.4 4.2  CL 101 98 101  CO2 28 31 27   GLUCOSE 144* 98 87  BUN 29* 26* 29*  CREATININE 0.82 0.82 0.81  CALCIUM 9.0 9.1 8.3*  GFRNONAA >60 >60 >60  ANIONGAP 8 9 7     Lipids No results for input(s): "CHOL", "TRIG", "HDL", "LABVLDL", "LDLCALC", "CHOLHDL" in the last 168 hours.  Hematology Recent Labs  Lab 12/01/22 0706 12/02/22 0543 12/03/22 0547  WBC 16.4* 18.6* 14.8*  RBC 4.41 4.24 3.91  HGB 12.4  11.8* 10.9*  HCT 38.6 37.6 34.9*  MCV 87.5 88.7 89.3  MCH 28.1 27.8 27.9  MCHC 32.1 31.4 31.2  RDW 14.2 14.2 14.5  PLT 418* 399 349   Thyroid No results for input(s): "TSH", "FREET4" in the last 168 hours.  BNPNo results for input(s): "BNP", "PROBNP" in the last 168 hours.  DDimer No results for input(s): "DDIMER" in the last 168 hours.   Radiology    DG CHEST PORT 1 VIEW  Result Date: 12/02/2022 CLINICAL DATA:  Leukocytosis. Cough, shortness of breath. History of flu. EXAM: PORTABLE CHEST 1 VIEW COMPARISON:  Radiograph 11/24/2022.  CT 11/25/2022 FINDINGS: Significant patient rotation. Improving patchy airspace disease in the right upper lobe. Small right pleural effusion persists, possibly improved. No new or progressive consolidation. Streaky opacities at the left lung base typical of atelectasis. Stable heart size and mediastinal contours allowing for patient rotation. Calcified mediastinal lymph nodes again seen. No pulmonary edema. No pneumothorax. IMPRESSION: Improving patchy airspace disease in the right upper  lobe. Small right pleural effusion persists, possibly improved. No new or progressive consolidation. Electronically Signed   By: Keith Rake M.D.   On: 12/02/2022 10:51    Cardiac Studies   09/27/22 TTE  IMPRESSIONS    1. Left ventricular ejection fraction, by estimation, is 60 to 65%. The  left ventricle has normal function. The left ventricle has no regional  wall motion abnormalities. Left ventricular diastolic parameters were  normal.   2. Right ventricular systolic function is normal. The right ventricular  size is normal. There is moderately elevated pulmonary artery systolic  pressure.   3. Left atrial size was moderately dilated.   4. The mitral valve is normal in structure. Trivial mitral valve  regurgitation. No evidence of mitral stenosis. Severe mitral annular  calcification.   5. The aortic valve is tricuspid. There is moderate calcification of the  aortic valve. Aortic valve regurgitation is not visualized. Aortic valve  sclerosis/calcification is present, without any evidence of aortic  stenosis.   6. The inferior vena cava is normal in size with greater than 50%  respiratory variability, suggesting right atrial pressure of 3 mmHg.   FINDINGS   Left Ventricle: Left ventricular ejection fraction, by estimation, is 60  to 65%. The left ventricle has normal function. The left ventricle has no  regional wall motion abnormalities. The left ventricular internal cavity  size was normal in size. There is   no left ventricular hypertrophy. Left ventricular diastolic parameters  were normal.   Right Ventricle: The right ventricular size is normal. No increase in  right ventricular wall thickness. Right ventricular systolic function is  normal. There is moderately elevated pulmonary artery systolic pressure.  The tricuspid regurgitant velocity is  3.08 m/s, and with an assumed right atrial pressure of 8 mmHg, the  estimated right ventricular systolic pressure is 42.7  mmHg.   Left Atrium: Left atrial size was moderately dilated.   Right Atrium: Right atrial size was normal in size.   Pericardium: There is no evidence of pericardial effusion.   Mitral Valve: The mitral valve is normal in structure. There is moderate  thickening of the mitral valve leaflet(s). There is moderate calcification  of the mitral valve leaflet(s). Severe mitral annular calcification.  Trivial mitral valve regurgitation.  No evidence of mitral valve stenosis. MV peak gradient, 3.3 mmHg. The mean  mitral valve gradient is 1.0 mmHg.   Tricuspid Valve: The tricuspid valve is normal in structure. Tricuspid  valve regurgitation  is mild . No evidence of tricuspid stenosis.   Aortic Valve: The aortic valve is tricuspid. There is moderate  calcification of the aortic valve. Aortic valve regurgitation is not  visualized. Aortic valve sclerosis/calcification is present, without any  evidence of aortic stenosis. Aortic valve mean  gradient measures 3.0 mmHg. Aortic valve peak gradient measures 5.1 mmHg.  Aortic valve area, by VTI measures 2.06 cm.   Pulmonic Valve: The pulmonic valve was normal in structure. Pulmonic valve  regurgitation is not visualized. No evidence of pulmonic stenosis.   Aorta: The aortic root is normal in size and structure.   Venous: The inferior vena cava is normal in size with greater than 50%  respiratory variability, suggesting right atrial pressure of 3 mmHg.   IAS/Shunts: No atrial level shunt detected by color flow Doppler.   Patient Profile     89 y.o. female with history of paroxysmal atrial fibrillation on Eliquis, chronic diastolic CHF, hypertension, hyperlipidemia, TIAs, COPD, GERD, and diverticulitis who presented with influenza B and COPD exacerbation with course complicated by Afib with RVR for which Cardiology was consulted.   Assessment & Plan     Paroxysmal atrial fibrillation, acute RVR Secondary hypercoagulable state  This  patient has a known history of afib, followed by Dr. Rayann Heman. Has generally maintained NSR per recent afib clinic notes though was found with RVR this admission in the setting of hypoxic respiratory failure/influenza B. Initially rates were managed with Diltiazem infusion which was subsequently stopped and then resumed with recurrence of RVR. Amiodarone was added for additional rate control.  Patient now transitioned to oral diltiazem 240mg  daily with stable rates in the upper 50s-low 60s following spontaneous conversion to NSR around 2150 on 1/14. Continue PRN Diltiazem 30mg  every 8 hours as needed for HR greater than 120bpm.  Amiodarone 200mg  PO BID ordered and infusion stopped. Given recurrence of afib this admission, short course of amiodarone recommended 2/2 underlying COPD, likely 1 month. Can plan to de-escalate rhythm/rate control as patient's respiratory status continues to improve.  Remains on home Labetalol 200mg  BID. Continue Eliquis 5mg .  Chronic Diastolic CHF  Patient with LVEF 60-65% on November 2023 echo. BNP 390.2 as of 1/6 and patient is without significant evidence of volume overload on physical exam.   Continue Spironolactone 25mg  (home dose) Patient on daily lasix 20mg , held in the setting of acute illness. Per daughter at bedside, does not sound like patient has been taking every day. Suspect that she would do well with PRN dosing at discharge.   Acute respiratory failure Acute COPD exacerbation with influenza B  Patient with flu-like symptoms x1 week found with influenza B. CXR with RUL infiltrate and patient initially required O2 support. She is now back on room air and endorses improved respiratory status. Continue management per primary team.   Hyperlipidemia  Continue Pravastatin  Hypertension  Home regimen likely to need some adjustment with Diltiazem. Currently stable BP.       For questions or updates, please contact [place] Please consult  [email or web address] for contact info under        Signed, Lily Kocher, PA-C  12/03/2022, 8:41 AM

## 2022-12-04 DIAGNOSIS — J9601 Acute respiratory failure with hypoxia: Secondary | ICD-10-CM | POA: Diagnosis not present

## 2022-12-04 MED ORDER — DILTIAZEM HCL ER COATED BEADS 240 MG PO CP24: 240.0000 mg | ORAL_CAPSULE | Freq: Every day | ORAL | 1 refills | Status: DC

## 2022-12-04 MED ORDER — IPRATROPIUM-ALBUTEROL 0.5-2.5 (3) MG/3ML IN SOLN: 3.0000 mL | Freq: Four times a day (QID) | RESPIRATORY_TRACT | 0 refills | Status: DC | PRN

## 2022-12-04 MED ORDER — PREDNISONE 10 MG PO TABS: 10.0000 mg | ORAL_TABLET | Freq: Every day | ORAL | 0 refills | Status: AC

## 2022-12-04 MED ORDER — POLYETHYLENE GLYCOL 3350 17 G PO PACK: 17.0000 g | PACK | Freq: Every day | ORAL | 0 refills | Status: DC | PRN

## 2022-12-04 MED ORDER — AMIODARONE HCL 200 MG PO TABS: 200.0000 mg | ORAL_TABLET | Freq: Two times a day (BID) | ORAL | 0 refills | Status: DC

## 2022-12-04 MED ORDER — PREDNISONE 20 MG PO TABS: 20.0000 mg | ORAL_TABLET | Freq: Every day | ORAL | 0 refills | Status: AC

## 2022-12-04 MED ORDER — AMIODARONE HCL 200 MG PO TABS: 200.0000 mg | ORAL_TABLET | Freq: Every day | ORAL | 0 refills | Status: DC

## 2022-12-04 NOTE — TOC Progression Note (Signed)
Transition of Care [facility]) - Progression Note    Patient Details  Name: [name] MRN: [id number] Date of Birth: 1933-03-22  Transition of Care [facility] CM/SW Contact  Purcell Mouton, RN Phone Number: 12/04/2022, 12:16 PM  Clinical Narrative:     [facility] HH rep called to inform that pt was discharging today.   Expected Discharge Plan: [place] Barriers to Discharge: No Barriers Identified  Expected Discharge Plan and Services   Discharge Planning Services: CM Consult Post Acute Care Choice: [place] arrangements for the past 2 months: Single Family Home Expected Discharge Date: 12/04/22                         HH Arranged: PT, OT HH Agency: [facility] Date HH Agency Contacted: 11/28/22 Time [place]: 1244 Representative spoke with at [place]: kelly   Social Determinants of Health ([place]) Interventions SDOH Screenings   Food Insecurity: No Food Insecurity (11/25/2022)  Housing: Low Risk  (11/25/2022)  Transportation Needs: No Transportation Needs (11/25/2022)  Utilities: Not At Risk (11/25/2022)  Tobacco Use: Low Risk  (11/24/2022)    Readmission Risk Interventions     No data to display

## 2022-12-04 NOTE — Discharge Summary (Signed)
Physician Discharge Summary  [name] STM:[id number] DOB: Aug 22, 1933 DOA: 11/24/2022  PCP: Myrlene Broker, MD  Admit date: 11/24/2022 Discharge date: 12/04/2022  Admitted From: Home Disposition:  Home  Discharge Condition:Stable CODE STATUS:FULL Diet recommendation: Heart Healthy  Brief/Interim Summary:  Patient is a 89 year old female with history of paroxysmal A-fib on Eliquis, COPD not on supplemental oxygen at home, TIA, hypertension, hyperlipidemia who presented to the emergency room with complaints of shortness of breath, fast heartbeat.  She was having flulike symptoms for a week and was tested positive for influenza B as an outpatient.  Chest x-ray had shown right upper lobe infiltrate was prescribed antibiotic but she had progressive worsening shortness of breath, cough.  On presentation, she was in A-fib with RVR.  She was saturating 88% on room air.  She had to be   placed on 2 L of oxygen per minute.  Chest x-ray showed moderately severe right upper lobe infiltrate, emphysematous changes.  Patient was managed for COPD exacerbation, pneumonia, A-fib with RVR.  Cardiology,PCCM were consulted.  Had prolonged hospital course.  Currently she is on room air, heart rate is stable after restarting on amiodarone, on normal sinus rhythm.  Medically stable for discharge home today.  Following problems were addressed during the hospitalization:  Acute respiratory failure with hypoxia/COPD exacerbation: Secondary to pneumonia, COPD exacerbation, flu.  Not on oxygen at home.  Hypoxic on arrival.  Continue steroids, bronchodilators.  Continue flutter valve, incentive centimeter  PCCM consulted , recommended to continue current management.  Added hypertonic saline.  On discharge, she has been recommended to continue Brovana, Pulmicort, as needed DuoNeb.  She was offered to set up follow-up appointment at pulmonary clinic but family declined.   Since 1/11, she has been weaned to room air.  No  significant wheezing today, on oral steroids now.  Follow-up as chest x-ray on 1/14 showed improvement, no new infiltrates.   Right upper lobe pneumonia: Diagnosed with pneumonia as an outpatient, was on oral antibiotics for that did not help.She completed the course of ceftriaxone, azithro.  Cultures have been negative so far.  Strep pneumoniae negative, Legionella antigen negative. CT chest without contrast done on 1/7 showed extensive heterogenous, groundglass airspace opacity predominantly in the right upper lobe consistent with multifocal infection, pulmonary edema.  Also multiple calcified mediastinal, right hilar nodes consistent with  sequela of prior granulomatous infection. Developed  leukocytosis , most likely contributed by steroids.Improving   Influenza B: Diagnosed as an outpatient.  Finished the course of  Tamiflu   A-fib with RVR:  On Eliquis for anticoagulation.Was following with EP as an outpatient.  Hospital course remarkable for persistent A-fib with RVR .cardiology consulted and following.  Was changed to Cardizem oral on 1/12 but again went to A-fib with RVR so restarted on Cardizem drip.Started on amiodarone drip finally improving the heart rate.  Currently on oral Cardizem and oral amiodarone.  She will follow-up with cardiology as an outpatient.   Hyperlipidemia: Continue statin   Hypertension: Continue Cardizem, spironolactone, labetalol   Obesity: BMI of 32.9.   Debility/deconditioning: Home health recommended by PT/OT   Discharge Diagnoses:  Principal Problem:   Acute respiratory failure with hypoxia ([place]) Active Problems:   Right upper lobe pneumonia   COPD with acute exacerbation (HCC)   Paroxysmal atrial fibrillation (HCC)   Influenza B   Hyponatremia   Essential hypertension   Hyperlipidemia   Atrial fibrillation with RVR [facility])    Discharge Instructions  Discharge Instructions  Diet - low sodium heart healthy   Complete by: As directed     Discharge instructions   Complete by: As directed    1)Please take prescribed medications as instructed 2)Follow up with your PCP in a week.   3)Follow up with cardiology as an outpatient.  Appointment has been scheduled on 12/12/22. 4)Follow up with pulmonology through the referral of your PCP.   Increase activity slowly   Complete by: As directed       Allergies as of 12/04/2022       Reactions   Keflex [cephalexin] Other (See Comments)   "makes pt feel really bad"        Medication List     STOP taking these medications    amLODipine 10 MG tablet Commonly known as: NORVASC   benazepril 20 MG tablet Commonly known as: LOTENSIN   diltiazem 30 MG tablet Commonly known as: Cardizem   furosemide 20 MG tablet Commonly known as: Lasix   Klor-Con M20 20 MEQ tablet Generic drug: potassium chloride SA       TAKE these medications    acetaminophen 500 MG tablet Commonly known as: TYLENOL Take 2 tablets (1,000 mg total) by mouth every 8 (eight) hours.   albuterol 108 (90 Base) MCG/ACT inhaler Commonly known as: VENTOLIN HFA Inhale 2 puffs into the lungs every 4 (four) hours as needed for wheezing or shortness of breath.   amiodarone 200 MG tablet Commonly known as: PACERONE Take 1 tablet (200 mg total) by mouth 2 (two) times daily for 6 days.   amiodarone 200 MG tablet Commonly known as: Pacerone Take 1 tablet (200 mg total) by mouth daily. Start taking on: December 10, 2022   cetirizine 10 MG tablet Commonly known as: ZYRTEC Take 10 mg by mouth daily.   cyanocobalamin 1000 MCG tablet Take 1 tablet by mouth every other day. Vitamin B12   diltiazem 240 MG 24 hr capsule Commonly known as: CARDIZEM CD Take 1 capsule (240 mg total) by mouth daily. Start taking on: December 05, 2022   Eliquis 5 MG Tabs tablet Generic drug: apixaban TAKE 1 TABLET BY MOUTH TWICE A DAY   ferrous sulfate 325 (65 FE) MG tablet Commonly known as: FerrouSul Take 1 tablet (325 mg  total) by mouth 3 (three) times daily with meals for 14 days. What changed: when to take this   gabapentin 300 MG capsule Commonly known as: NEURONTIN Take 300 mg by mouth See admin instructions. Take one capsule (300 mg) in the morning and two capsules (600 mg) in the evening.   ipratropium-albuterol 0.5-2.5 (3) MG/3ML Soln Commonly known as: DUONEB Take 3 mLs by nebulization every 6 (six) hours as needed (shortness of breath).   labetalol 200 MG tablet Commonly known as: NORMODYNE Take 200 mg by mouth 2 (two) times daily.   methocarbamol 750 MG tablet Commonly known as: ROBAXIN Take 750 mg by mouth 3 (three) times daily as needed for muscle spasms.   mirtazapine 15 MG tablet Commonly known as: REMERON Take 15 mg by mouth at bedtime.   multivitamin with minerals Tabs tablet Take 1 tablet by mouth daily.   OMEGA-3 FISH OIL PO Take 1 capsule by mouth daily.   pantoprazole 40 MG tablet Commonly known as: PROTONIX Take 40 mg by mouth daily.   polyethylene glycol 17 g packet Commonly known as: MIRALAX / GLYCOLAX Take 17 g by mouth daily as needed.   pravastatin 20 MG tablet Commonly known as: PRAVACHOL Take 20  mg by mouth at bedtime.   predniSONE 20 MG tablet Commonly known as: DELTASONE Take 1 tablet (20 mg total) by mouth daily with breakfast for 1 day. Start taking on: December 05, 2022   predniSONE 10 MG tablet Commonly known as: DELTASONE Take 1 tablet (10 mg total) by mouth daily with breakfast for 3 days. Start taking on: December 06, 2022   PRESERVISION AREDS 2 PO Take 1 tablet by mouth daily. Vision Shield   spironolactone 25 MG tablet Commonly known as: ALDACTONE Take 25 mg by mouth daily.   sucralfate 1 g tablet Commonly known as: CARAFATE Take 1 g by mouth 4 (four) times daily -  with meals and at bedtime.   vitamin C 1000 MG tablet Take 1,000 mg by mouth daily.   Vitamin D3 125 MCG (5000 UT) Tabs Take 5,000 Units by mouth daily.         Follow-up Information     [facility], [place] Follow up.   Specialty: [place] Why: [facility] physical therapy/occupational therapy Contact information: [place] [address] [phone]         Janina Mayo, MD Follow up.   Specialty: Cardiology Why: Hospital follow-up with Cardiology scheduled for 12/12/2022 at 9:40am at our [facility] office. Please arrive 15 minutes early for check-in. If this date/time does not work for you, pleaes call our office to reschedule. Contact information: [address] [place] [address] [phone]                Allergies  Allergen Reactions   Keflex [Cephalexin] Other (See Comments)    "makes pt feel really bad"    Consultations: Cardiology, pulmonology   Procedures/Studies: DG CHEST PORT 1 VIEW  Result Date: 12/02/2022 CLINICAL DATA:  Leukocytosis. Cough, shortness of breath. History of flu. EXAM: PORTABLE CHEST 1 VIEW COMPARISON:  Radiograph 11/24/2022.  CT 11/25/2022 FINDINGS: Significant patient rotation. Improving patchy airspace disease in the right upper lobe. Small right pleural effusion persists, possibly improved. No new or progressive consolidation. Streaky opacities at the left lung base typical of atelectasis. Stable heart size and mediastinal contours allowing for patient rotation. Calcified mediastinal lymph nodes again seen. No pulmonary edema. No pneumothorax. IMPRESSION: Improving patchy airspace disease in the right upper lobe. Small right pleural effusion persists, possibly improved. No new or progressive consolidation. Electronically Signed   By: Keith Rake M.D.   On: 12/02/2022 10:51   CT CHEST WO CONTRAST  Result Date: 11/25/2022 CLINICAL DATA:  Shortness of breath, pneumonia EXAM: CT CHEST WITHOUT CONTRAST TECHNIQUE: Multidetector CT imaging of the chest was performed following the standard protocol without IV contrast. RADIATION DOSE REDUCTION: This exam  was performed according to the departmental dose-optimization program which includes automated exposure control, adjustment of the mA and/or kV according to patient size and/or use of iterative reconstruction technique. COMPARISON:  None Available. FINDINGS: Cardiovascular: Aortic atherosclerosis. Cardiomegaly. Right coronary artery calcifications. Enlargement of the main pulmonary artery measuring up to 3.9 cm in caliber. No pericardial effusion. Mediastinum/Nodes: Multiple calcified mediastinal and right hilar lymph nodes. Thyroid gland, trachea, and esophagus demonstrate no significant findings. Lungs/Pleura: Small right pleural effusion and associated atelectasis or consolidation. Extensive heterogeneous and ground-glass airspace opacity as well as interlobular septal thickening, predominantly seen in the right upper lobe (series 7, image 50). Trace left pleural effusion with associated dependent atelectasis or consolidation. Upper Abdomen: No acute abnormality. Musculoskeletal: No chest wall abnormality. Multiple subacute appearing fractures of  the lateral right ribs, involving at least the eighth and ninth ribs (series 7, image 105). IMPRESSION: 1. Extensive heterogeneous and ground-glass airspace opacity as well as interlobular septal thickening in the right lung, predominantly seen in the right upper lobe. Findings are consistent with multifocal infection and/or pulmonary edema. 2. Small right and trace left pleural effusions with associated atelectasis or consolidation. 3. Multiple calcified mediastinal and right hilar lymph nodes, consistent with sequelae of prior granulomatous infection. There is most likely some underlying chronic scarring and architectural distortion of the right lung secondary to this process. 4. Cardiomegaly and coronary artery disease. 5. Enlargement of the main pulmonary artery, as can be seen in pulmonary hypertension. 6. Multiple subacute appearing fractures of the lateral right  ribs, involving at least the eighth and ninth ribs. Aortic Atherosclerosis (ICD10-I70.0). Electronically Signed   By: Delanna Ahmadi M.D.   On: 11/25/2022 17:48   DG Chest 2 View  Result Date: 11/24/2022 CLINICAL DATA:  Shortness of breath. EXAM: CHEST - 2 VIEW COMPARISON:  November 20, 2022 FINDINGS: The heart size and mediastinal contours are within normal limits. The lungs are hyperinflated with mild right-sided volume loss noted. Chronic appearing increased interstitial lung markings are seen. Moderate severity right upper lobe infiltrate is also present. This is increased in severity when compared to the prior study. Mild areas of atelectasis and/or infiltrate are also seen within the bilateral lung bases. There is a small right pleural effusion. No pneumothorax is identified. The visualized skeletal structures are unremarkable. IMPRESSION: 1. COPD with moderate severity right upper lobe infiltrate. 2. Mild bibasilar atelectasis and/or infiltrate. 3. Small right pleural effusion. Electronically Signed   By: Virgina Norfolk M.D.   On: 11/24/2022 22:46      Subjective: Patient seen and examined at bedside today.  Hemodynamically stable for discharge.  On room air.  Normal sinus rhythm.  I called the daughter and discussed about the discharge plan.  Discharge Exam: Vitals:   12/04/22 0753 12/04/22 0924  BP:  135/62  Pulse:  64  Resp:    Temp:    SpO2: 96%    Vitals:   12/04/22 0600 12/04/22 0653 12/04/22 0753 12/04/22 0924  BP: 137/64   135/62  Pulse:    64  Resp: 17     Temp:  98.6 F (37 C)    TempSrc:      SpO2:  96% 96%   Weight:      Height:        General: Pt is alert, awake, not in acute distress Cardiovascular: RRR, S1/S2 +, no rubs, no gallops Respiratory: CTA bilaterally, no wheezing, no rhonchi Abdominal: Soft, NT, ND, bowel sounds + Extremities: no edema, no cyanosis    The results of significant diagnostics from this hospitalization (including imaging,  microbiology, ancillary and laboratory) are listed below for reference.     Microbiology: Recent Results (from the past 240 hour(s))  Resp panel by RT-PCR (RSV, Flu A&B, Covid) Anterior Nasal Swab     Status: Abnormal   Collection Time: 11/24/22 11:08 PM   Specimen: Anterior Nasal Swab  Result Value Ref Range Status   SARS Coronavirus 2 by RT PCR NEGATIVE NEGATIVE Final    Comment: (NOTE) SARS-CoV-2 target nucleic acids are NOT DETECTED.  The SARS-CoV-2 RNA is generally detectable in upper respiratory specimens during the acute phase of infection. The lowest concentration of SARS-CoV-2 viral copies this assay can detect is 138 copies/mL. A negative result does not preclude SARS-Cov-2 infection and  should not be used as the sole basis for treatment or other patient management decisions. A negative result may occur with  improper specimen collection/handling, submission of specimen other than nasopharyngeal swab, presence of viral mutation(s) within the areas targeted by this assay, and inadequate number of viral copies(<138 copies/mL). A negative result must be combined with clinical observations, patient history, and epidemiological information. The expected result is Negative.  Fact Sheet for Patients:  EntrepreneurPulse.com.au  Fact Sheet for Healthcare Providers:  IncredibleEmployment.be  This test is no t yet approved or cleared by the [place] FDA and  has been authorized for detection and/or diagnosis of SARS-CoV-2 by FDA under an Emergency Use Authorization (EUA). This EUA will remain  in effect (meaning this test can be used) for the duration of the COVID-19 declaration under Section 564(b)(1) of the Act, 21 U.S.C.section 360bbb-3(b)(1), unless the authorization is terminated  or revoked sooner.       Influenza A by PCR NEGATIVE NEGATIVE Final   Influenza B by PCR POSITIVE (A) NEGATIVE Final    Comment: (NOTE) The Xpert  Xpress SARS-CoV-2/FLU/RSV plus assay is intended as an aid in the diagnosis of influenza from Nasopharyngeal swab specimens and should not be used as a sole basis for treatment. Nasal washings and aspirates are unacceptable for Xpert Xpress SARS-CoV-2/FLU/RSV testing.  Fact Sheet for Patients: EntrepreneurPulse.com.au  Fact Sheet for Healthcare Providers: IncredibleEmployment.be  This test is not yet approved or cleared by the [place] FDA and has been authorized for detection and/or diagnosis of SARS-CoV-2 by FDA under an Emergency Use Authorization (EUA). This EUA will remain in effect (meaning this test can be used) for the duration of the COVID-19 declaration under Section 564(b)(1) of the Act, 21 U.S.C. section 360bbb-3(b)(1), unless the authorization is terminated or revoked.     Resp Syncytial Virus by PCR NEGATIVE NEGATIVE Final    Comment: (NOTE) Fact Sheet for Patients: EntrepreneurPulse.com.au  Fact Sheet for Healthcare Providers: IncredibleEmployment.be  This test is not yet approved or cleared by the [place] FDA and has been authorized for detection and/or diagnosis of SARS-CoV-2 by FDA under an Emergency Use Authorization (EUA). This EUA will remain in effect (meaning this test can be used) for the duration of the COVID-19 declaration under Section 564(b)(1) of the Act, 21 U.S.C. section 360bbb-3(b)(1), unless the authorization is terminated or revoked.  Performed at [facility], [place] [address] [place], [place] [address]      Labs: BNP (last 3 results) Recent Labs    10/01/22 0758 11/24/22 2117  BNP 703.6* [id number]   Basic Metabolic Panel: Recent Labs  Lab 11/28/22 0507 11/29/22 0518 12/01/22 0706 12/03/22 0547  NA 132* 137 138 135  K 5.3* 5.0 4.4 4.2  CL 98 101 98 101  CO2 28 28 31 27   GLUCOSE 141* 144* 98 87  BUN 32* 29* 26* 29*  CREATININE  0.77 0.82 0.82 0.81  CALCIUM 8.9 9.0 9.1 8.3*   Liver Function Tests: No results for input(s): "AST", "ALT", "ALKPHOS", "BILITOT", "PROT", "ALBUMIN" in the last 168 hours. No results for input(s): "LIPASE", "AMYLASE" in the last 168 hours. No results for input(s): "AMMONIA" in the last 168 hours. CBC: Recent Labs  Lab 11/29/22 0518 11/30/22 0450 12/01/22 0706 12/02/22 0543 12/03/22 0547  WBC 15.8* 16.1* 16.4* 18.6* 14.8*  HGB 11.2* 11.3* 12.4 11.8* 10.9*  HCT 36.1 35.8* 38.6 37.6 34.9*  MCV 88.0 87.7 87.5 88.7 89.3  PLT 446* 419* 418* 399 349  Cardiac Enzymes: No results for input(s): "CKTOTAL", "CKMB", "CKMBINDEX", "TROPONINI" in the last 168 hours. BNP: Invalid input(s): "POCBNP" CBG: No results for input(s): "GLUCAP" in the last 168 hours. D-Dimer No results for input(s): "DDIMER" in the last 72 hours. Hgb A1c No results for input(s): "HGBA1C" in the last 72 hours. Lipid Profile No results for input(s): "CHOL", "HDL", "LDLCALC", "TRIG", "CHOLHDL", "LDLDIRECT" in the last 72 hours. Thyroid function studies No results for input(s): "TSH", "T4TOTAL", "T3FREE", "THYROIDAB" in the last 72 hours.  Invalid input(s): "FREET3" Anemia work up No results for input(s): "VITAMINB12", "FOLATE", "FERRITIN", "TIBC", "IRON", "RETICCTPCT" in the last 72 hours. Urinalysis    Component Value Date/Time   COLORURINE STRAW (A) 11/24/2022 0833   APPEARANCEUR CLEAR 11/24/2022 0833   LABSPEC 1.008 11/24/2022 0833   PHURINE 5.0 11/24/2022 0833   GLUCOSEU NEGATIVE 11/24/2022 0833   HGBUR NEGATIVE 11/24/2022 0833   BILIRUBINUR NEGATIVE 11/24/2022 0833   KETONESUR NEGATIVE 11/24/2022 0833   PROTEINUR NEGATIVE 11/24/2022 0833   NITRITE NEGATIVE 11/24/2022 0833   LEUKOCYTESUR NEGATIVE 11/24/2022 0833   Sepsis Labs Recent Labs  Lab 11/30/22 0450 12/01/22 0706 12/02/22 0543 12/03/22 0547  WBC 16.1* 16.4* 18.6* 14.8*   Microbiology Recent Results (from the past 240 hour(s))  Resp  panel by RT-PCR (RSV, Flu A&B, Covid) Anterior Nasal Swab     Status: Abnormal   Collection Time: 11/24/22 11:08 PM   Specimen: Anterior Nasal Swab  Result Value Ref Range Status   SARS Coronavirus 2 by RT PCR NEGATIVE NEGATIVE Final    Comment: (NOTE) SARS-CoV-2 target nucleic acids are NOT DETECTED.  The SARS-CoV-2 RNA is generally detectable in upper respiratory specimens during the acute phase of infection. The lowest concentration of SARS-CoV-2 viral copies this assay can detect is 138 copies/mL. A negative result does not preclude SARS-Cov-2 infection and should not be used as the sole basis for treatment or other patient management decisions. A negative result may occur with  improper specimen collection/handling, submission of specimen other than nasopharyngeal swab, presence of viral mutation(s) within the areas targeted by this assay, and inadequate number of viral copies(<138 copies/mL). A negative result must be combined with clinical observations, patient history, and epidemiological information. The expected result is Negative.  Fact Sheet for Patients:  BloggerCourse.com  Fact Sheet for Healthcare Providers:  SeriousBroker.it  This test is no t yet approved or cleared by the [place] FDA and  has been authorized for detection and/or diagnosis of SARS-CoV-2 by FDA under an Emergency Use Authorization (EUA). This EUA will remain  in effect (meaning this test can be used) for the duration of the COVID-19 declaration under Section 564(b)(1) of the Act, 21 U.S.C.section 360bbb-3(b)(1), unless the authorization is terminated  or revoked sooner.       Influenza A by PCR NEGATIVE NEGATIVE Final   Influenza B by PCR POSITIVE (A) NEGATIVE Final    Comment: (NOTE) The Xpert Xpress SARS-CoV-2/FLU/RSV plus assay is intended as an aid in the diagnosis of influenza from Nasopharyngeal swab specimens and should not be used  as a sole basis for treatment. Nasal washings and aspirates are unacceptable for Xpert Xpress SARS-CoV-2/FLU/RSV testing.  Fact Sheet for Patients: BloggerCourse.com  Fact Sheet for Healthcare Providers: SeriousBroker.it  This test is not yet approved or cleared by the [place] FDA and has been authorized for detection and/or diagnosis of SARS-CoV-2 by FDA under an Emergency Use Authorization (EUA). This EUA will remain in effect (meaning this test can be used) for  the duration of the COVID-19 declaration under Section 564(b)(1) of the Act, 21 U.S.C. section 360bbb-3(b)(1), unless the authorization is terminated or revoked.     Resp Syncytial Virus by PCR NEGATIVE NEGATIVE Final    Comment: (NOTE) Fact Sheet for Patients: EntrepreneurPulse.com.au  Fact Sheet for Healthcare Providers: IncredibleEmployment.be  This test is not yet approved or cleared by the [place] FDA and has been authorized for detection and/or diagnosis of SARS-CoV-2 by FDA under an Emergency Use Authorization (EUA). This EUA will remain in effect (meaning this test can be used) for the duration of the COVID-19 declaration under Section 564(b)(1) of the Act, 21 U.S.C. section 360bbb-3(b)(1), unless the authorization is terminated or revoked.  Performed at [facility], [place] [address], [place], [place] [address]     Please note: You were cared for by a hospitalist during your hospital stay. Once you are discharged, your primary care physician will handle any further medical issues. Please note that NO REFILLS for any discharge medications will be authorized once you are discharged, as it is imperative that you return to your primary care physician (or establish a relationship with a primary care physician if you do not have one) for your post hospital discharge needs so that they can reassess your  need for medications and monitor your lab values.    Time coordinating discharge: 40 minutes  SIGNED:   Shelly Coss, MD  [facility] 12/04/2022, 10:49 AM Pager [phone]  If 7PM-7AM, please contact night-coverage [email or web address] Password [email or web address]

## 2022-12-04 NOTE — Progress Notes (Signed)
Patient to be discharged to home today. All discharge Instructions including Discharge Medications and schedules for these Medications reviewed with the Patient and the Family. Understanding verbalized and discharge AVS with the Patient at time of discharge

## 2022-12-11 ENCOUNTER — Ambulatory Visit: Payer: Medicare Other | Admitting: Internal Medicine

## 2022-12-12 ENCOUNTER — Ambulatory Visit: Payer: Medicare Other | Admitting: Internal Medicine

## 2022-12-18 ENCOUNTER — Encounter: Payer: Self-pay | Admitting: Internal Medicine

## 2022-12-18 ENCOUNTER — Ambulatory Visit
Admit: 2022-12-18 | Discharge: 2022-12-18 | Disposition: A | Discharge status: Home / Self Care | Payer: Medicare Other | Source: Ambulatory Visit | Attending: Nurse Practitioner | Admitting: Nurse Practitioner

## 2022-12-18 ENCOUNTER — Encounter (HOSPITAL_COMMUNITY): Payer: Self-pay | Admitting: Nurse Practitioner

## 2022-12-18 ENCOUNTER — Ambulatory Visit (INDEPENDENT_AMBULATORY_CARE_PROVIDER_SITE_OTHER): Payer: Medicare Other | Admitting: Internal Medicine

## 2022-12-18 VITALS — BP 144/82 | HR 96 | Ht 64.0 in | Wt 188.2 lb

## 2022-12-18 VITALS — BP 106/66 | HR 99 | Ht 64.0 in | Wt 188.6 lb

## 2022-12-18 DIAGNOSIS — J440 Chronic obstructive pulmonary disease with acute lower respiratory infection: Secondary | ICD-10-CM | POA: Insufficient documentation

## 2022-12-18 DIAGNOSIS — Z79899 Other long term (current) drug therapy: Secondary | ICD-10-CM | POA: Diagnosis not present

## 2022-12-18 DIAGNOSIS — Z8673 Personal history of transient ischemic attack (TIA), and cerebral infarction without residual deficits: Secondary | ICD-10-CM | POA: Insufficient documentation

## 2022-12-18 DIAGNOSIS — Z7901 Long term (current) use of anticoagulants: Secondary | ICD-10-CM | POA: Diagnosis not present

## 2022-12-18 DIAGNOSIS — J189 Pneumonia, unspecified organism: Secondary | ICD-10-CM | POA: Insufficient documentation

## 2022-12-18 DIAGNOSIS — E785 Hyperlipidemia, unspecified: Secondary | ICD-10-CM | POA: Diagnosis not present

## 2022-12-18 DIAGNOSIS — I480 Paroxysmal atrial fibrillation: Secondary | ICD-10-CM

## 2022-12-18 DIAGNOSIS — I10 Essential (primary) hypertension: Secondary | ICD-10-CM | POA: Insufficient documentation

## 2022-12-18 DIAGNOSIS — I4819 Other persistent atrial fibrillation: Secondary | ICD-10-CM | POA: Insufficient documentation

## 2022-12-18 NOTE — Patient Instructions (Signed)
Medication Instructions:  No Changes In Medications at this time.  *If you need a refill on your cardiac medications before your next appointment, please call your pharmacy*  Lab Work: None Ordered At This Time.  If you have labs (blood work) drawn today and your tests are completely normal, you will receive your results only by: MyChart Message (if you have MyChart) OR A paper copy in the mail If you have any lab test that is abnormal or we need to change your treatment, we will call you to review the results.  Testing/Procedures: None Ordered At This Time.   Follow-Up: At [facility], you and your health needs are our priority.  As part of our continuing mission to provide you with exceptional heart care, we have created designated Provider Care Teams.  These Care Teams include your primary Cardiologist (physician) and Advanced Practice Providers (APPs -  Physician Assistants and Nurse Practitioners) who all work together to provide you with the care you need, when you need it.    Your next appointment:   6 month(s)  Provider:   Branch, Mary E, MD    

## 2022-12-18 NOTE — Progress Notes (Addendum)
Primary Care Physician: Myrlene Broker, MD Referring Physician:[facility] f/u  Cardiologist: Dr. Phineas Inches    [name] is a 89 y.o. female with a h/o paroxysmal A-fib on Eliquis, COPD not on supplemental oxygen at home, TIA, hypertension, hyperlipidemia who presented to the emergency room with complaints of shortness of breath, fast heartbeat.  She was having flulike symptoms for a week and was tested positive for influenza B as an outpatient.  Chest x-ray had shown right upper lobe infiltrate was prescribed antibiotic but she had progressive worsening shortness of breath, cough.  On presentation, she was in A-fib with RVR.  She was saturating 88% on room air.  She had to be  placed on 2 L of oxygen per minute.  Chest x-ray showed moderately severe right upper lobe infiltrate, emphysematous changes.  Patient was managed for COPD exacerbation, pneumonia, A-fib with RVR.  Cardiology,PCCM were consulted.  Had prolonged hospital course.  Cardiology stared amiodarone load for control for management of  afib as RVR was a problem.   She is now in the afib clinic with her SIL. She remains in Afib but rate controlled in the 90's. At home, SIL reports that she has had SR for several days, he was surprised to hear in afib. However, she did have a long walk to get to clinic. Using a walker and states she is still deconditioned from  pneumonia, although she is feeling better. She has a f/u with [facility]. Harl Bowie at [facility] today. She continues on  amiodarone at 200 mg a day and eliquis 5 mg bid   Today, she denies symptoms of palpitations, chest pain, shortness of breath, orthopnea, PND, lower extremity edema, dizziness, presyncope, syncope, or neurologic sequela. The patient is tolerating medications without difficulties and is otherwise without complaint today.   Past Medical History:  Diagnosis Date   Arthritis    Atrial fibrillation, transient ([place])    post op hip surgery; May 2021   Diverticulitis    Dyspnea     with exertion    GERD (gastroesophageal reflux disease)    Hyperlipidemia    Hypertension    Stroke ([place])    hx opf mini strokes    TIA (transient ischemic attack)    Past Surgical History:  Procedure Laterality Date   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/13/2020   Procedure: CONVERSION TO LEFT TOTAL HIP-POSTERIOR APPROACH;  Surgeon: Paralee Cancel, MD;  Location: [facility];  Service: Orthopedics;  Laterality: Left;  90 mins   HIP FRACTURE SURGERY Left 04/16/2020   KNEE SURGERY     WRIST SURGERY      Current Outpatient Medications  Medication Sig Dispense Refill   acetaminophen (TYLENOL) 500 MG tablet Take 2 tablets (1,000 mg total) by mouth every 8 (eight) hours. (Patient taking differently: Take 1,000 mg by mouth as needed.) 30 tablet 0   albuterol (VENTOLIN HFA) 108 (90 Base) MCG/ACT inhaler Inhale 2 puffs into the lungs every 4 (four) hours as needed for wheezing or shortness of breath.     amiodarone (PACERONE) 200 MG tablet Take 1 tablet (200 mg total) by mouth daily. 30 tablet 0   Ascorbic Acid (VITAMIN C) 1000 MG tablet Take 1,000 mg by mouth daily.     cetirizine (ZYRTEC) 10 MG tablet Take 10 mg by mouth daily.      Cholecalciferol (VITAMIN D3) 125 MCG (5000 UT) TABS Take 5,000 Units by mouth daily.     cyanocobalamin 1000 MCG tablet Take 1 tablet by mouth  every other day. Vitamin B12     diltiazem (CARDIZEM CD) 240 MG 24 hr capsule Take 1 capsule (240 mg total) by mouth daily. 30 capsule 1   ELIQUIS 5 MG TABS tablet TAKE 1 TABLET BY MOUTH TWICE A DAY 180 tablet 1   ferrous sulfate (FERROUSUL) 325 (65 FE) MG tablet Take 1 tablet (325 mg total) by mouth 3 (three) times daily with meals for 14 days. (Patient taking differently: Take 325 mg by mouth 2 (two) times daily with a meal.) 42 tablet 0   gabapentin (NEURONTIN) 300 MG capsule Take 300 mg by mouth See admin instructions. Take one capsule (300 mg) in the morning and two capsules (600 mg) in the evening.     ipratropium-albuterol  (DUONEB) 0.5-2.5 (3) MG/3ML SOLN Take 3 mLs by nebulization every 6 (six) hours as needed (shortness of breath). 360 mL 0   labetalol (NORMODYNE) 200 MG tablet Take 200 mg by mouth 2 (two) times daily.     methocarbamol (ROBAXIN) 750 MG tablet Take 750 mg by mouth 3 (three) times daily as needed for muscle spasms.     mirtazapine (REMERON) 15 MG tablet Take 15 mg by mouth at bedtime.     Multiple Vitamin (MULTIVITAMIN WITH MINERALS) TABS tablet Take 1 tablet by mouth daily.     Multiple Vitamins-Minerals (PRESERVISION AREDS 2 PO) Take 1 tablet by mouth 2 (two) times daily. Vision Shield     Omega-3 Fatty Acids (OMEGA-3 FISH OIL PO) Take 2 capsules by mouth daily.     pantoprazole (PROTONIX) 40 MG tablet Take 40 mg by mouth daily.     pravastatin (PRAVACHOL) 20 MG tablet Take 20 mg by mouth at bedtime.     psyllium (METAMUCIL) 58.6 % packet Take 1 packet by mouth as needed.     spironolactone (ALDACTONE) 25 MG tablet Take 25 mg by mouth daily.      sucralfate (CARAFATE) 1 g tablet Take 1 g by mouth with breakfast, with lunch, and with evening meal.     No current facility-administered medications for this encounter.    Allergies  Allergen Reactions   Keflex [Cephalexin] Other (See Comments)    "makes pt feel really bad"    Social History   Socioeconomic History   Marital status: Widowed    Spouse name: Not on file   Number of children: Not on file   Years of education: Not on file   Highest education level: Not on file  Occupational History   Not on file  Tobacco Use   Smoking status: Never   Smokeless tobacco: Never  Substance and Sexual Activity   Alcohol use: Never   Drug use: Never   Sexual activity: Not on file  Other Topics Concern   Not on file  Social History Narrative   Lives in [place] but currently staying with daughter in [place].   homemaker   Social Determinants of Health   Financial Resource Strain: Not on file  Food Insecurity: No [place]  (11/25/2022)   Hunger Vital Sign    Worried About Running Out of Food in the Last Year: Never true    Ran Out of Food in the Last Year: Never true  Transportation Needs: No Transportation Needs (11/25/2022)   PRAPARE - Hydrologist (Medical): No    Lack of Transportation (Non-Medical): No  Physical Activity: Not on file  Stress: Not on file  Social Connections: Not on file  Intimate Partner Violence:  Not At Risk (11/25/2022)   Humiliation, Afraid, Rape, and Kick questionnaire    Fear of Current or Ex-Partner: No    Emotionally Abused: No    Physically Abused: No    Sexually Abused: No    Family History  Problem Relation Age of Onset   Diabetes Mother     ROS- All systems are reviewed and negative except as per the HPI above  Physical Exam: Vitals:   12/18/22 1325  BP: 106/66  Pulse: 99  Weight: 85.5 kg  Height: 5\' 4"  (1.626 m)   Wt Readings from Last 3 Encounters:  12/18/22 85.5 kg  12/01/22 86.8 kg  09/26/22 87 kg    Labs: Lab Results  Component Value Date   NA 135 12/03/2022   K 4.2 12/03/2022   CL 101 12/03/2022   CO2 27 12/03/2022   GLUCOSE 87 12/03/2022   BUN 29 (H) 12/03/2022   CREATININE 0.81 12/03/2022   CALCIUM 8.3 (L) 12/03/2022   PHOS 3.2 10/01/2022   MG 2.1 11/26/2022   No results found for: "INR" Lab Results  Component Value Date   CHOL 211 (H) 07/08/2020   HDL 38 (L) 07/08/2020   LDLCALC 137 (H) 07/08/2020   TRIG 198 (H) 07/08/2020     GEN- The patient is well appearing, alert and oriented x 3 today.   Head- normocephalic, atraumatic Eyes-  Sclera clear, conjunctiva pink Ears- hearing intact Oropharynx- clear Neck- supple, no JVP Lymph- no cervical lymphadenopathy Lungs- Clear to ausculation on left, few crackles heard rt middle lobe,  normal work of breathing Heart- irregular rate and rhythm, no murmurs, rubs or gallops, PMI not laterally displaced GI- soft, NT, ND, + BS Extremities- no clubbing, cyanosis,  or edema MS- no significant deformity or atrophy Skin- no rash or lesion Psych- euthymic mood, full affect Neuro- strength and sensation are intact  EKG-Vent. rate 99 BPM PR interval * ms QRS duration 90 ms QT/QTcB 336/431 ms P-R-T axes * 38 76 Atrial flutter with variable A-V block Abnormal ECG When compared with ECG of 03-Dec-2022 14:26, PREVIOUS ECG IS PRESENT    Assessment and Plan:  1. Persistent Afib with RVR In the setting of respiratory failure 2/2 COPD and influenza induced RUL pneumonia  Was placed on amiodarone for control of Afib with RVR Was in SR for the last couple of days per SIL, but now in afib , possibly 2/2 deconditioning and the long walk to the afib clinic Will continue amiodarone at 200 mg daily and labetalol at 200 mg bid  2. CHA2DS2VASc  score of at least 4 Continue eliquis 5 mg bid   3. HTN Stable   4. Pneumonia  Resolving  Still deconditioned   F/u with Dr. Phineas Inches today at [facility], as already scheduled   Butch Penny C. Blake Goya, [facility] [address] [place], [place] [address] [phone]

## 2022-12-18 NOTE — Progress Notes (Signed)
Cardiology Office Note:    Date:  12/18/2022   ID:  [name], DOB Apr 01, 1933, MRN [id number]  PCP:  Myrlene Broker, MD   [facility] Cardiologist:  Janina Mayo, MD     Referring MD: Myrlene Broker, MD   No chief complaint on file. Afib  History of Present Illness:    [name] is a 89 y.o. female with a hx of paroxysmal afib 03/2020, TIA 06/2020, HTN, COPD, HLD referral for hospital FU  She was admitted on 11/24/2022 - 1/16 for SOB.She was having flulike symptoms for a week and was tested positive for influenza B as an outpatient.  Chest x-ray had shown right upper lobe infiltrate was prescribed antibiotic but she had progressive worsening shortness of breath, cough.  On presentation, she was in A-fib with RVR. I saw her in the hospital. Started amiodarone.  She was in sinus rhythm until she had PNA per her son.  Prior to this, she was on cardizem. No prior AADs. She was followed by Dr. Rayann Heman, last saw 03/2022. She had a TIA in 2021  Was in sinus rhythm at home. She has [employer]. Son talking with Dr. Rayann Heman. She went to afib clinic today and has rate controlled afib.  She reports ongoing SOB  She denies angina, lower extremity edema, PND or orthopnea.   Wt Readings from Last 3 Encounters:  12/18/22 188 lb 3.2 oz (85.4 kg)  12/18/22 188 lb 9.6 oz (85.5 kg)  12/01/22 191 lb 6.4 oz (86.8 kg)     Cardiology Studies: 09/27/2022-TTE EF 60-65%, RVSP 45 mmHg, trivial MR Cardiac monitor September-October: sinus rhythm, no afib 08/02/2020- TTE EF 55-60%, RVSP 3mmHg, negative bubble study  Past Medical History:  Diagnosis Date   Arthritis    Atrial fibrillation, transient ([place])    post op hip surgery; May 2021   Diverticulitis    Dyspnea    with exertion    GERD (gastroesophageal reflux disease)    Hyperlipidemia    Hypertension    Stroke ([place])    hx opf mini strokes    TIA (transient ischemic attack)     Past Surgical History:   Procedure Laterality Date   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/13/2020   Procedure: CONVERSION TO LEFT TOTAL HIP-POSTERIOR APPROACH;  Surgeon: Paralee Cancel, MD;  Location: [facility];  Service: Orthopedics;  Laterality: Left;  90 mins   HIP FRACTURE SURGERY Left 04/16/2020   KNEE SURGERY     WRIST SURGERY      Current Medications: Current Meds  Medication Sig   acetaminophen (TYLENOL) 500 MG tablet Take 2 tablets (1,000 mg total) by mouth every 8 (eight) hours. (Patient taking differently: Take 1,000 mg by mouth as needed.)   albuterol (VENTOLIN HFA) 108 (90 Base) MCG/ACT inhaler Inhale 2 puffs into the lungs every 4 (four) hours as needed for wheezing or shortness of breath.   amiodarone (PACERONE) 200 MG tablet Take 1 tablet (200 mg total) by mouth daily.   Ascorbic Acid (VITAMIN C) 1000 MG tablet Take 1,000 mg by mouth daily.   cetirizine (ZYRTEC) 10 MG tablet Take 10 mg by mouth daily.    Cholecalciferol (VITAMIN D3) 125 MCG (5000 UT) TABS Take 5,000 Units by mouth daily.   cyanocobalamin 1000 MCG tablet Take 1 tablet by mouth every other day. Vitamin B12   diltiazem (CARDIZEM CD) 240 MG 24 hr capsule Take 1 capsule (240 mg total) by mouth daily.   ELIQUIS 5  MG TABS tablet TAKE 1 TABLET BY MOUTH TWICE A DAY   ferrous sulfate (FERROUSUL) 325 (65 FE) MG tablet Take 1 tablet (325 mg total) by mouth 3 (three) times daily with meals for 14 days. (Patient taking differently: Take 325 mg by mouth 2 (two) times daily with a meal.)   gabapentin (NEURONTIN) 300 MG capsule Take 300 mg by mouth See admin instructions. Take one capsule (300 mg) in the morning and two capsules (600 mg) in the evening.   ipratropium-albuterol (DUONEB) 0.5-2.5 (3) MG/3ML SOLN Take 3 mLs by nebulization every 6 (six) hours as needed (shortness of breath).   labetalol (NORMODYNE) 200 MG tablet Take 200 mg by mouth 2 (two) times daily.   methocarbamol (ROBAXIN) 750 MG tablet Take 750 mg by mouth 3 (three) times daily as needed  for muscle spasms.   mirtazapine (REMERON) 15 MG tablet Take 15 mg by mouth at bedtime.   Multiple Vitamin (MULTIVITAMIN WITH MINERALS) TABS tablet Take 1 tablet by mouth daily.   Multiple Vitamins-Minerals (PRESERVISION AREDS 2 PO) Take 1 tablet by mouth 2 (two) times daily. Vision Shield   Omega-3 Fatty Acids (OMEGA-3 FISH OIL PO) Take 2 capsules by mouth daily.   pantoprazole (PROTONIX) 40 MG tablet Take 40 mg by mouth daily.   pravastatin (PRAVACHOL) 20 MG tablet Take 20 mg by mouth at bedtime.   psyllium (METAMUCIL) 58.6 % packet Take 1 packet by mouth as needed.   spironolactone (ALDACTONE) 25 MG tablet Take 25 mg by mouth daily.    sucralfate (CARAFATE) 1 g tablet Take 1 g by mouth with breakfast, with lunch, and with evening meal.     Allergies:   Keflex [cephalexin]   Social History   Socioeconomic History   Marital status: Widowed    Spouse name: Not on file   Number of children: Not on file   Years of education: Not on file   Highest education level: Not on file  Occupational History   Not on file  Tobacco Use   Smoking status: Never   Smokeless tobacco: Never  Substance and Sexual Activity   Alcohol use: Never   Drug use: Never   Sexual activity: Not on file  Other Topics Concern   Not on file  Social History Narrative   Lives in [place] but currently staying with daughter in [place].   homemaker   Social Determinants of Health   Financial Resource Strain: Not on file  Food Insecurity: No [place] (11/25/2022)   Hunger Vital Sign    Worried About Running Out of Food in the Last Year: Never true    Ran Out of Food in the Last Year: Never true  Transportation Needs: No Transportation Needs (11/25/2022)   PRAPARE - Hydrologist (Medical): No    Lack of Transportation (Non-Medical): No  Physical Activity: Not on file  Stress: Not on file  Social Connections: Not on file     Family History: The patient's family history  includes Diabetes in her mother.  ROS:   Please see the history of present illness.     All other systems reviewed and are negative.  EKGs/Labs/Other Studies Reviewed:    The following studies were reviewed today:   EKG:  EKG is  ordered today.  The ekg ordered today demonstrates - in afib clinic  Recent Labs: 10/01/2022: ALT 39 11/24/2022: B Natriuretic Peptide 390.2 11/25/2022: TSH 0.666 11/26/2022: Magnesium 2.1 12/03/2022: BUN 29; Creatinine, Ser 0.81; Hemoglobin  10.9; Platelets 349; Potassium 4.2; Sodium 135  Recent Lipid Panel    Component Value Date/Time   CHOL 211 (H) 07/08/2020 1130   TRIG 198 (H) 07/08/2020 1130   HDL 38 (L) 07/08/2020 1130   CHOLHDL 5.6 (H) 07/08/2020 1130   LDLCALC 137 (H) 07/08/2020 1130     Risk Assessment/Calculations:    CHA2DS2-VASc Score = 6   This indicates a 9.7% annual risk of stroke. The patient's score is based upon: CHF History: 1 HTN History: 1 Diabetes History: 1 Stroke History: 0 Vascular Disease History: 0 Age Score: 2 Gender Score: 1              Physical Exam:    VS:  BP (!) 144/82   Pulse 96   Ht 5\' 4"  (1.626 m)   Wt 188 lb 3.2 oz (85.4 kg)   SpO2 99%   BMI 32.30 kg/m     Wt Readings from Last 3 Encounters:  12/18/22 188 lb 3.2 oz (85.4 kg)  12/18/22 188 lb 9.6 oz (85.5 kg)  12/01/22 191 lb 6.4 oz (86.8 kg)     GEN: Elderly, Well nourished, well developed in no acute distress HEENT: Normal NECK: No JVD LYMPHATICS: No lymphadenopathy CARDIAC: IRRR, no murmurs, rubs, gallops RESPIRATORY:  Clear to auscultation without rales, wheezing or rhonchi  ABDOMEN: Soft, non-tender, non-distended MUSCULOSKELETAL:  No edema; No deformity  SKIN: Warm and dry NEUROLOGIC:  Alert and oriented x 3 PSYCHIATRIC:  Normal affect   ASSESSMENT:    SOB: multifactorial. Has COPD. No signs of VO today. Her weight is stable at 188 pounds.  Group II/III pulmonary HTN. Afib can be contributing. Deconditioning can as  well  Paroxysmal Afib: had recurrence with recent PNA. Started amidoarone. TSH normal 11/25/2022. Normal LFTs. I discussed DCCV with her son. Her son prefers sticking with cardizem for now and the amiodarone. Can reconsider if develops RVR - continue amiodarone 200 mg daily - diltiazem 240 mg daily - continue eliquis 5 mg BID - on BB  HTN: on labetalol 200 mg BID. Continue spironolactone 25 mg daily PLAN:    In order of problems listed above:  Follow up 6 months           Medication Adjustments/Labs and Tests Ordered: Current medicines are reviewed at length with the patient today.  Concerns regarding medicines are outlined above.  No orders of the defined types were placed in this encounter.  No orders of the defined types were placed in this encounter.   There are no Patient Instructions on file for this visit.   Signed, Janina Mayo, MD  12/18/2022 4:15 PM    [place]

## 2022-12-27 ENCOUNTER — Encounter (HOSPITAL_COMMUNITY): Payer: Self-pay | Admitting: *Deleted

## 2023-01-13 ENCOUNTER — Encounter (HOSPITAL_COMMUNITY): Payer: Self-pay

## 2023-01-13 ENCOUNTER — Other Ambulatory Visit: Payer: Self-pay

## 2023-01-13 ENCOUNTER — Inpatient Hospital Stay (HOSPITAL_COMMUNITY)
Admission: EM | Admit: 2023-01-13 | Discharge: 2023-01-19 | DRG: 493 | Disposition: A | Discharge status: Skilled Nursing Facility | Payer: Medicare Other | Attending: Internal Medicine | Admitting: Internal Medicine

## 2023-01-13 ENCOUNTER — Emergency Department (HOSPITAL_COMMUNITY): Payer: Medicare Other

## 2023-01-13 DIAGNOSIS — G8929 Other chronic pain: Secondary | ICD-10-CM | POA: Diagnosis present

## 2023-01-13 DIAGNOSIS — Z7901 Long term (current) use of anticoagulants: Secondary | ICD-10-CM | POA: Diagnosis not present

## 2023-01-13 DIAGNOSIS — S82201A Unspecified fracture of shaft of right tibia, initial encounter for closed fracture: Secondary | ICD-10-CM

## 2023-01-13 DIAGNOSIS — I5032 Chronic diastolic (congestive) heart failure: Secondary | ICD-10-CM | POA: Diagnosis present

## 2023-01-13 DIAGNOSIS — Z881 Allergy status to other antibiotic agents status: Secondary | ICD-10-CM

## 2023-01-13 DIAGNOSIS — Z8616 Personal history of COVID-19: Secondary | ICD-10-CM

## 2023-01-13 DIAGNOSIS — E1140 Type 2 diabetes mellitus with diabetic neuropathy, unspecified: Secondary | ICD-10-CM | POA: Diagnosis present

## 2023-01-13 DIAGNOSIS — S82401D Unspecified fracture of shaft of right fibula, subsequent encounter for closed fracture with routine healing: Secondary | ICD-10-CM | POA: Diagnosis not present

## 2023-01-13 DIAGNOSIS — E785 Hyperlipidemia, unspecified: Secondary | ICD-10-CM | POA: Diagnosis present

## 2023-01-13 DIAGNOSIS — M810 Age-related osteoporosis without current pathological fracture: Secondary | ICD-10-CM | POA: Diagnosis present

## 2023-01-13 DIAGNOSIS — I10 Essential (primary) hypertension: Secondary | ICD-10-CM | POA: Diagnosis present

## 2023-01-13 DIAGNOSIS — I272 Pulmonary hypertension, unspecified: Secondary | ICD-10-CM | POA: Diagnosis present

## 2023-01-13 DIAGNOSIS — I480 Paroxysmal atrial fibrillation: Secondary | ICD-10-CM | POA: Diagnosis present

## 2023-01-13 DIAGNOSIS — M1990 Unspecified osteoarthritis, unspecified site: Secondary | ICD-10-CM | POA: Diagnosis present

## 2023-01-13 DIAGNOSIS — S82201D Unspecified fracture of shaft of right tibia, subsequent encounter for closed fracture with routine healing: Secondary | ICD-10-CM | POA: Diagnosis not present

## 2023-01-13 DIAGNOSIS — Z01810 Encounter for preprocedural cardiovascular examination: Secondary | ICD-10-CM

## 2023-01-13 DIAGNOSIS — N179 Acute kidney failure, unspecified: Secondary | ICD-10-CM | POA: Diagnosis present

## 2023-01-13 DIAGNOSIS — S82401A Unspecified fracture of shaft of right fibula, initial encounter for closed fracture: Secondary | ICD-10-CM

## 2023-01-13 DIAGNOSIS — D62 Acute posthemorrhagic anemia: Secondary | ICD-10-CM | POA: Diagnosis not present

## 2023-01-13 DIAGNOSIS — Z96642 Presence of left artificial hip joint: Secondary | ICD-10-CM | POA: Diagnosis present

## 2023-01-13 DIAGNOSIS — J449 Chronic obstructive pulmonary disease, unspecified: Secondary | ICD-10-CM | POA: Diagnosis present

## 2023-01-13 DIAGNOSIS — Z8701 Personal history of pneumonia (recurrent): Secondary | ICD-10-CM

## 2023-01-13 DIAGNOSIS — K219 Gastro-esophageal reflux disease without esophagitis: Secondary | ICD-10-CM | POA: Diagnosis present

## 2023-01-13 DIAGNOSIS — M898X9 Other specified disorders of bone, unspecified site: Secondary | ICD-10-CM | POA: Diagnosis present

## 2023-01-13 DIAGNOSIS — Z79899 Other long term (current) drug therapy: Secondary | ICD-10-CM

## 2023-01-13 DIAGNOSIS — D72829 Elevated white blood cell count, unspecified: Secondary | ICD-10-CM | POA: Diagnosis present

## 2023-01-13 DIAGNOSIS — S82251A Displaced comminuted fracture of shaft of right tibia, initial encounter for closed fracture: Secondary | ICD-10-CM | POA: Diagnosis present

## 2023-01-13 DIAGNOSIS — S82831A Other fracture of upper and lower end of right fibula, initial encounter for closed fracture: Secondary | ICD-10-CM | POA: Diagnosis present

## 2023-01-13 DIAGNOSIS — Z833 Family history of diabetes mellitus: Secondary | ICD-10-CM

## 2023-01-13 DIAGNOSIS — S82409A Unspecified fracture of shaft of unspecified fibula, initial encounter for closed fracture: Secondary | ICD-10-CM | POA: Diagnosis present

## 2023-01-13 DIAGNOSIS — Z8673 Personal history of transient ischemic attack (TIA), and cerebral infarction without residual deficits: Secondary | ICD-10-CM | POA: Diagnosis not present

## 2023-01-13 DIAGNOSIS — Z885 Allergy status to narcotic agent status: Secondary | ICD-10-CM

## 2023-01-13 DIAGNOSIS — I4891 Unspecified atrial fibrillation: Secondary | ICD-10-CM | POA: Diagnosis not present

## 2023-01-13 DIAGNOSIS — I110 Hypertensive heart disease with heart failure: Secondary | ICD-10-CM | POA: Diagnosis present

## 2023-01-13 DIAGNOSIS — K5900 Constipation, unspecified: Secondary | ICD-10-CM | POA: Diagnosis not present

## 2023-01-13 HISTORY — DX: Unspecified fracture of shaft of right tibia, initial encounter for closed fracture: S82.201A

## 2023-01-13 LAB — CBC WITH DIFFERENTIAL/PLATELET
Abs Immature Granulocytes: 0.1 10*3/uL — ABNORMAL HIGH (ref 0.00–0.07)
Basophils Absolute: 0.1 10*3/uL (ref 0.0–0.1)
Basophils Relative: 1 %
Eosinophils Absolute: 0.3 10*3/uL (ref 0.0–0.5)
Eosinophils Relative: 3 %
HCT: 40.3 % (ref 36.0–46.0)
Hemoglobin: 12.7 g/dL (ref 12.0–15.0)
Immature Granulocytes: 1 %
Lymphocytes Relative: 20 %
Lymphs Abs: 2 10*3/uL (ref 0.7–4.0)
MCH: 28 pg (ref 26.0–34.0)
MCHC: 31.5 g/dL (ref 30.0–36.0)
MCV: 89 fL (ref 80.0–100.0)
Monocytes Absolute: 0.8 10*3/uL (ref 0.1–1.0)
Monocytes Relative: 8 %
Neutro Abs: 6.9 10*3/uL (ref 1.7–7.7)
Neutrophils Relative %: 67 %
Platelets: 304 10*3/uL (ref 150–400)
RBC: 4.53 MIL/uL (ref 3.87–5.11)
RDW: 14.3 % (ref 11.5–15.5)
WBC: 10.2 10*3/uL (ref 4.0–10.5)
nRBC: 0 % (ref 0.0–0.2)

## 2023-01-13 LAB — TYPE AND SCREEN
ABO/RH(D): O POS
Antibody Screen: NEGATIVE

## 2023-01-13 LAB — BASIC METABOLIC PANEL
Anion gap: 12 (ref 5–15)
BUN: 25 mg/dL — ABNORMAL HIGH (ref 8–23)
CO2: 22 mmol/L (ref 22–32)
Calcium: 9.2 mg/dL (ref 8.9–10.3)
Chloride: 99 mmol/L (ref 98–111)
Creatinine, Ser: 1.1 mg/dL — ABNORMAL HIGH (ref 0.44–1.00)
GFR, Estimated: 49 mL/min — ABNORMAL LOW (ref >60)
Glucose, Bld: 171 mg/dL — ABNORMAL HIGH (ref 70–99)
Potassium: 4.6 mmol/L (ref 3.5–5.1)
Sodium: 133 mmol/L — ABNORMAL LOW (ref 135–145)

## 2023-01-13 MED ORDER — ONDANSETRON HCL 4 MG/2ML IJ SOLN
4.0000 mg | Freq: Once | INTRAMUSCULAR | Status: AC
  Filled 2023-01-13: qty 2

## 2023-01-13 MED ORDER — HYDROMORPHONE HCL 1 MG/ML IJ SOLN
0.5000 mg | Freq: Once | INTRAMUSCULAR | Status: AC
  Administered 2023-01-13: 0.5 mg via INTRAVENOUS
  Filled 2023-01-13: qty 1

## 2023-01-13 MED ORDER — SODIUM CHLORIDE 0.9 % IV SOLN
6.2500 mg | Freq: Four times a day (QID) | INTRAVENOUS | Status: DC | PRN
  Administered 2023-01-13: 6.25 mg via INTRAVENOUS
  Filled 2023-01-13 (×4): qty 0.25

## 2023-01-13 MED ORDER — ONDANSETRON HCL 4 MG/2ML IJ SOLN
4.0000 mg | Freq: Once | INTRAMUSCULAR | Status: AC
  Administered 2023-01-13: 4 mg via INTRAVENOUS
  Filled 2023-01-13: qty 2

## 2023-01-13 MED ORDER — MORPHINE SULFATE (PF) 4 MG/ML IV SOLN
4.0000 mg | Freq: Once | INTRAVENOUS | Status: DC
  Filled 2023-01-13: qty 1

## 2023-01-13 MED ORDER — FENTANYL CITRATE PF 50 MCG/ML IJ SOSY
50.0000 ug | PREFILLED_SYRINGE | Freq: Once | INTRAMUSCULAR | Status: AC
  Administered 2023-01-13: 50 ug via INTRAVENOUS
  Filled 2023-01-13: qty 1

## 2023-01-13 MED ORDER — ONDANSETRON HCL 4 MG/2ML IJ SOLN
INTRAMUSCULAR | Status: AC
  Administered 2023-01-13: 4 mg via INTRAVENOUS
  Administered 2023-01-13: 4 mg
  Filled 2023-01-13: qty 2

## 2023-01-13 NOTE — Consult Note (Signed)
Reason for Consult:Right leg injury Referring Physician: EDP  [name] is an 89 y.o. female.  HPI: 89 yo community ambulator who missed a step and suffered a ground level fall today and complained of immediate right lower leg pain and deformity. She was transported to [facility] ED for eval and treatment. She denies any other complaints.   Past Medical History:  Diagnosis Date   Arthritis    Atrial fibrillation, transient ([place])    post op hip surgery; May 2021   Diverticulitis    Dyspnea    with exertion    GERD (gastroesophageal reflux disease)    Hyperlipidemia    Hypertension    Stroke ([place])    hx opf mini strokes    TIA (transient ischemic attack)     Past Surgical History:  Procedure Laterality Date   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/13/2020   Procedure: CONVERSION TO LEFT TOTAL HIP-POSTERIOR APPROACH;  Surgeon: Paralee Cancel, MD;  Location: [facility];  Service: Orthopedics;  Laterality: Left;  90 mins   HIP FRACTURE SURGERY Left 04/16/2020   KNEE SURGERY     WRIST SURGERY      Family History  Problem Relation Age of Onset   Diabetes Mother     Social History:  reports that she has never smoked. She has never used smokeless tobacco. She reports that she does not drink alcohol and does not use drugs.  Allergies:  Allergies  Allergen Reactions   Keflex [Cephalexin] Other (See Comments)    "makes pt feel really bad"   Morphine Anxiety and Other (See Comments)    Pt states this made her feel like she was going to die    Medications: I have reviewed the patient's current medications.  Results for orders placed or performed during the hospital encounter of 01/13/23 (from the past 48 hour(s))  Basic metabolic panel     Status: Abnormal   Collection Time: 01/13/23  7:27 PM  Result Value Ref Range   Sodium 133 (L) 135 - 145 mmol/L   Potassium 4.6 3.5 - 5.1 mmol/L   Chloride 99 98 - 111 mmol/L   CO2 22 22 - 32 mmol/L   Glucose, Bld 171 (H) 70 - 99 mg/dL     Comment: Glucose reference range applies only to samples taken after fasting for at least 8 hours.   BUN 25 (H) 8 - 23 mg/dL   Creatinine, Ser 1.10 (H) 0.44 - 1.00 mg/dL   Calcium 9.2 8.9 - 10.3 mg/dL   GFR, Estimated 49 (L) >60 mL/min    Comment: (NOTE) Calculated using the CKD-EPI Creatinine Equation (2021)    Anion gap 12 5 - 15    Comment: Performed at [facility], [place] [address] [place], [place] [address]  CBC with Differential     Status: Abnormal   Collection Time: 01/13/23  7:27 PM  Result Value Ref Range   WBC 10.2 4.0 - 10.5 K/uL   RBC 4.53 3.87 - 5.11 MIL/uL   Hemoglobin 12.7 12.0 - 15.0 g/dL   HCT 40.3 36.0 - 46.0 %   MCV 89.0 80.0 - 100.0 fL   MCH 28.0 26.0 - 34.0 pg   MCHC 31.5 30.0 - 36.0 g/dL   RDW 14.3 11.5 - 15.5 %   Platelets 304 150 - 400 K/uL   nRBC 0.0 0.0 - 0.2 %   Neutrophils Relative % 67 %   Neutro Abs 6.9 1.7 - 7.7 K/uL   Lymphocytes Relative  20 %   Lymphs Abs 2.0 0.7 - 4.0 K/uL   Monocytes Relative 8 %   Monocytes Absolute 0.8 0.1 - 1.0 K/uL   Eosinophils Relative 3 %   Eosinophils Absolute 0.3 0.0 - 0.5 K/uL   Basophils Relative 1 %   Basophils Absolute 0.1 0.0 - 0.1 K/uL   Immature Granulocytes 1 %   Abs Immature Granulocytes 0.10 (H) 0.00 - 0.07 K/uL    Comment: Performed at [facility], [place] [address] [place], [place] [address]  Type and screen [place]     Status: None   Collection Time: 01/13/23  7:41 PM  Result Value Ref Range   ABO/RH(D) O POS    Antibody Screen NEG    Sample Expiration      01/16/2023,2359 Performed at [facility], [place] [address] [place], [place] [address]     DG Tibia/Fibula Right  Result Date: 01/13/2023 CLINICAL DATA:  Status post fall. EXAM: RIGHT TIBIA AND FIBULA - 2 VIEW COMPARISON:  None Available. FINDINGS: Acute, mildly displaced fracture deformities are seen involving the shafts of the distal right tibia and distal right  fibula. An additional nondisplaced fracture of the shaft of the proximal right fibula is seen. There is no evidence of dislocation. Soft tissue swelling is seen along the previously noted fracture sites. IMPRESSION: Acute fractures of the right tibia and right fibula, as described above. Electronically Signed   By: Virgina Norfolk M.D.   On: 01/13/2023 20:42   DG Ankle Right Port  Result Date: 01/13/2023 CLINICAL DATA:  Status post fall. EXAM: PORTABLE RIGHT ANKLE - 2 VIEW COMPARISON:  None Available. FINDINGS: The right ankle was imaged in a fiberglass cast with subsequently obscured osseous detail. Acute, mildly displaced fracture deformities are seen involving the shafts of the distal right tibia and distal right fibula. There is no evidence of dislocation. Soft tissue swelling is seen along the previously noted fracture sites. IMPRESSION: Acute fracture deformities of the distal right tibia and distal right fibula. Electronically Signed   By: Virgina Norfolk M.D.   On: 01/13/2023 20:39   DG Chest Port 1 View  Result Date: 01/13/2023 CLINICAL DATA:  Status post fall. EXAM: PORTABLE CHEST 1 VIEW COMPARISON:  December 02, 2022 FINDINGS: The heart size and mediastinal contours are within normal limits. The lungs are hyperinflated. Mild, stable airspace disease is seen within the right upper lobe. A subcentimeter calcified lung nodule is seen within the lateral aspect of the mid left lung. There is no evidence of a pleural effusion or pneumothorax. The visualized skeletal structures are unremarkable. IMPRESSION: Mild right upper lobe airspace disease, unchanged in severity when compared to the prior study. Electronically Signed   By: Virgina Norfolk M.D.   On: 01/13/2023 20:38    Review of Systems Blood pressure (!) 166/80, pulse 64, temperature 97.7 F (36.5 C), temperature source Oral, resp. rate 20, height '5\' 4"'$  (1.626 m), weight 86.2 kg, SpO2 95 %. Physical Exam Patient is alert and oriented  and in minimal distress. Neck non tender with pain free AROM. Thoracic and Lumbar spine with no tenderness and no bony stepoffs Pelvis stable to rock and abdomen soft and non tender. No pain with compression of the ribs Bilateral UEs with pain free AROM and normal grade 5 strength Right LE splinted. Skin intact per EDP. Some bruising noted but no open wounds.  Left LE with pain free AROM of the hip, knee and ankle. No  distal swelling.  She can wiggle toes on the right foot. Sensation intact Right LE Assessment/Plan: Displaced right tib/fib fracture.  Splinted for now. Extended splint to long leg.  Will need ORIF vs IM nail for this unstable fracture pattern.  Family is at the bedside.  Family requesting care of Dr Altamese Montverde with [facility].  Dr Marcelino Scot aware of the patient and will assume ortho care.   Medicine Admit for medical management and optimization in anticipation for surgery on the [place] this week. Hold Eliquis. (Last dose this AM)  Augustin Schooling 01/13/2023, 10:02 PM

## 2023-01-13 NOTE — ED Provider Notes (Signed)
[facility] Provider Note   CSN: [id number] Arrival date & time: 01/13/23  1911     History  No chief complaint on file.   [name] is a 89 y.o. female.  89 year old female with prior medical history as detailed below presents for evaluation.  Patient mis-stepped as she was exiting a car this evening.  She injured her right lower extremity.  Patient was obvious deformity to right shin.  Patient did not strike her head.  She denies headache.  She denies neck pain.  She complains of significant discomfort to the distal right lower extremity.  She is on Eliquis.  Her last dose of Eliquis taken was earlier today.  She did not take the evening dose.  Patient without break in skin or bleeding from right lower extremity.  The history is provided by the patient and medical records.       Home Medications Prior to Admission medications   Medication Sig Start Date End Date Taking? Authorizing Provider  acetaminophen (TYLENOL) 500 MG tablet Take 2 tablets (1,000 mg total) by mouth every 8 (eight) hours. Patient taking differently: Take 1,000 mg by mouth as needed. 09/16/20   Danae Orleans, PA-C  albuterol (VENTOLIN HFA) 108 (90 Base) MCG/ACT inhaler Inhale 2 puffs into the lungs every 4 (four) hours as needed for wheezing or shortness of breath. 06/08/21   [provider]  amiodarone (PACERONE) 200 MG tablet Take 1 tablet (200 mg total) by mouth daily. 12/10/22 01/09/23  Shelly Coss, MD  Ascorbic Acid (VITAMIN C) 1000 MG tablet Take 1,000 mg by mouth daily.    [provider]  cetirizine (ZYRTEC) 10 MG tablet Take 10 mg by mouth daily.     [provider]  Cholecalciferol (VITAMIN D3) 125 MCG (5000 UT) TABS Take 5,000 Units by mouth daily.    [provider]  cyanocobalamin 1000 MCG tablet Take 1 tablet by mouth every other day. Vitamin B12    [provider]  diltiazem (CARDIZEM CD) 240  MG 24 hr capsule Take 1 capsule (240 mg total) by mouth daily. 12/05/22   Shelly Coss, MD  ELIQUIS 5 MG TABS tablet TAKE 1 TABLET BY MOUTH TWICE A DAY 07/16/22   Allred, Jeneen Rinks, MD  ferrous sulfate (FERROUSUL) 325 (65 FE) MG tablet Take 1 tablet (325 mg total) by mouth 3 (three) times daily with meals for 14 days. Patient taking differently: Take 325 mg by mouth 2 (two) times daily with a meal. 09/16/20 12/18/22  Danae Orleans, PA-C  gabapentin (NEURONTIN) 300 MG capsule Take 300 mg by mouth See admin instructions. Take one capsule (300 mg) in the morning and two capsules (600 mg) in the evening.    [provider]  ipratropium-albuterol (DUONEB) 0.5-2.5 (3) MG/3ML SOLN Take 3 mLs by nebulization every 6 (six) hours as needed (shortness of breath). 12/04/22   Shelly Coss, MD  labetalol (NORMODYNE) 200 MG tablet Take 200 mg by mouth 2 (two) times daily.    [provider]  methocarbamol (ROBAXIN) 750 MG tablet Take 750 mg by mouth 3 (three) times daily as needed for muscle spasms. 03/06/22   [provider]  mirtazapine (REMERON) 15 MG tablet Take 15 mg by mouth at bedtime. 02/27/22   [provider]  Multiple Vitamin (MULTIVITAMIN WITH MINERALS) TABS tablet Take 1 tablet by mouth daily.    [provider]  Multiple Vitamins-Minerals (PRESERVISION AREDS 2 PO) Take 1 tablet by mouth 2 (  two) times daily. [employer], Historical, MD  Omega-3 Fatty Acids (OMEGA-3 FISH OIL PO) Take 2 capsules by mouth daily.    [provider]  pantoprazole (PROTONIX) 40 MG tablet Take 40 mg by mouth daily. 05/13/21   [provider]  pravastatin (PRAVACHOL) 20 MG tablet Take 20 mg by mouth at bedtime.    [provider]  psyllium (METAMUCIL) 58.6 % packet Take 1 packet by mouth as needed.    [provider]  spironolactone (ALDACTONE) 25 MG tablet Take 25 mg by mouth daily.     [provider]  sucralfate (CARAFATE) 1  g tablet Take 1 g by mouth with breakfast, with lunch, and with evening meal.    [provider]      Allergies    Keflex [cephalexin]    Review of Systems   Review of Systems  All other systems reviewed and are negative.   Physical Exam Updated Vital Signs BP (!) 187/83   Pulse 73   Resp (!) 24   SpO2 95%  Physical Exam Vitals and nursing note reviewed.  Constitutional:      General: She is not in acute distress.    Appearance: Normal appearance. She is well-developed.  HENT:     Head: Normocephalic and atraumatic.  Eyes:     Conjunctiva/sclera: Conjunctivae normal.     Pupils: Pupils are equal, round, and reactive to light.  Cardiovascular:     Rate and Rhythm: Normal rate and regular rhythm.     Heart sounds: Normal heart sounds.  Pulmonary:     Effort: Pulmonary effort is normal. No respiratory distress.     Breath sounds: Normal breath sounds.  Abdominal:     General: There is no distension.     Palpations: Abdomen is soft.     Tenderness: There is no abdominal tenderness.  Musculoskeletal:        General: Tenderness, deformity and signs of injury present.     Cervical back: Normal range of motion and neck supple.     Comments: Obvious deformity to distal right lower extremity.  Consistent with distal right tib-fib fracture.  Splint applied upon arrival of the patient's room.  Distal right lower extremity is neurovascularly intact both prior to and following splint application.  Skin:    General: Skin is warm and dry.  Neurological:     General: No focal deficit present.     Mental Status: She is alert and oriented to person, place, and time. Mental status is at baseline.     Cranial Nerves: No cranial nerve deficit.     Sensory: No sensory deficit.     ED Results / Procedures / Treatments   Labs (all labs ordered are listed, but only abnormal results are displayed) Labs Reviewed  BASIC METABOLIC PANEL - Abnormal; Notable for the following  components:      Result Value   Sodium 133 (*)    Glucose, Bld 171 (*)    BUN 25 (*)    Creatinine, Ser 1.10 (*)    GFR, Estimated 49 (*)    All other components within normal limits  CBC WITH DIFFERENTIAL/PLATELET - Abnormal; Notable for the following components:   Abs Immature Granulocytes 0.10 (*)    All other components within normal limits  TYPE AND SCREEN    EKG None  Radiology No results found.  Procedures Procedures    Medications Ordered in ED Medications  fentaNYL (SUBLIMAZE) injection 50  mcg (has no administration in time range)  ondansetron (ZOFRAN) injection 4 mg (has no administration in time range)    ED Course/ Medical Decision Making/ A&P                             Medical Decision Making Amount and/or Complexity of Data Reviewed Labs: ordered.  Risk Prescription drug management. Decision regarding hospitalization.    Medical Screen Complete  This patient presented to the ED with complaint of right leg injury.  This complaint involves an extensive number of treatment options. The initial differential diagnosis includes, but is not limited to, trauma  This presentation is: Acute, Self-Limited, Previously Undiagnosed, Uncertain Prognosis, Complicated, Systemic Symptoms, and Threat to Life/Bodily Function  Patient with misstep and fall as she was exiting from car.  Patient with obvious deformity and injury to the right lower extremity.  Patient did not strike head.  Patient without complaint of other injury.  Splint applied to distal right lower extremity upon arrival.  Patient with obvious distal tib-fib fracture on right.  Patient without open component of fracture.  Patient appears to be sensitive to narcotics given nausea and vomiting after administration of narcotics.  X-rays confirm right lower extremity fracture.  Patient seen by Dr. Veverly Fells with [facility].  Patient to be admitted with eventual placement at [facility] for operative  repair.  Hospitalist service made aware of case and will evaluate for admission and bed placement at [facility].  Additional history obtained:  External records from outside sources obtained and reviewed including prior ED visits and prior Inpatient records.    Lab Tests:  I ordered and personally interpreted labs.  The pertinent results include: CBC, BMP, type and screen   Imaging Studies ordered:  I ordered imaging studies including right tib-fib, right ankle, chest I independently visualized and interpreted obtained imaging which showed proximal right fibula fracture, distal right tib-fib fracture I agree with the radiologist interpretation.   Cardiac Monitoring:  The patient was maintained on a cardiac monitor.  I personally viewed and interpreted the cardiac monitor which showed an underlying rhythm of: NSR   Medicines ordered:  I ordered medication including fentanyl, zofran, phenergan, dilaudid  for pain, nausea, vomiting  Reevaluation of the patient after these medicines showed that the patient: improved   Problem List / ED Course:  Right tib-fib fracture   Reevaluation:  After the interventions noted above, I reevaluated the patient and found that they have: stayed the same  Disposition:  After consideration of the diagnostic results and the patients response to treatment, I feel that the patent would benefit from admission.          Final Clinical Impression(s) / ED Diagnoses Final diagnoses:  Closed fracture of right tibia and fibula, initial encounter    Rx / DC Orders ED Discharge Orders     None         Valarie Merino, MD 01/13/23 2220

## 2023-01-13 NOTE — ED Notes (Signed)
Pt vomiting. Dr. Francia Greaves notified. Verbal order for zofran given

## 2023-01-13 NOTE — ED Triage Notes (Signed)
Arrived POV for fall. Pt fell getting out of car and has an obvious deformity to right ankle. Pt is on eliquis, denies hitting head. Pt denies any other injury

## 2023-01-13 NOTE — ED Notes (Signed)
Pt is still dry heaving and vomiting. Awaiting phenergan from pharmacy

## 2023-01-13 NOTE — ED Notes (Signed)
Pt placed on 2L [place]

## 2023-01-13 NOTE — H&P (Incomplete)
History and Physical   [facility] - [place] @ [facility] Admission History and Physical [employer], D.O.    Patient Name: [name] MR#: [id number] Date of Birth: 1933/10/24 Date of Admission: 01/13/2023  Referring MD/NP/PA: Dr. Francia Greaves Primary Care Physician: Myrlene Broker, MD  Chief Complaint:  Chief Complaint  Patient presents with   Fall   Dislocation  Please note the entire history is obtained from the patient's emergency department chart, emergency department provider and the patient's family who is at the bedside.    HPI: [name] is a 89 y.o. female with a known history of osteoarthritis, atrial fibrillation on Eliquis, GERD, hypertension, hyperlipidemia, TIA/CVA presents to the emergency department for evaluation of right lower extremity pain status post fall.  Patient was in a usual state of health until this evening she was getting into a car when she fell landing on her right side.  Patient lives at home with her daughter and is functionally independent with the use of a walker.  She has been getting home physical therapy   Otherwise there has been no change in status. Patient has been taking medication as prescribed and there has been no recent change in medication or diet.  No recent antibiotics.  There has been no recent illness, hospitalizations, travel or sick contacts.    EMS/ED Course: Patient received fentanyl, Dilaudid, Zofran, Phenergan. Medical admission has been requested for preop optimization, Ortho to perform surgery for proximal right fibula fracture and distal right tib fib fracture.  Review of Systems:  Unable to obtain   Past Medical History:  Diagnosis Date   Arthritis    Atrial fibrillation, transient ([place])    post op hip surgery; May 2021   Diverticulitis    Dyspnea    with exertion    GERD (gastroesophageal reflux disease)    Hyperlipidemia    Hypertension    Stroke ([place])    hx opf mini strokes    TIA (transient  ischemic attack)     Past Surgical History:  Procedure Laterality Date   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/13/2020   Procedure: CONVERSION TO LEFT TOTAL HIP-POSTERIOR APPROACH;  Surgeon: Paralee Cancel, MD;  Location: [facility];  Service: Orthopedics;  Laterality: Left;  90 mins   HIP FRACTURE SURGERY Left 04/16/2020   KNEE SURGERY     WRIST SURGERY       reports that she has never smoked. She has never used smokeless tobacco. She reports that she does not drink alcohol and does not use drugs.  Allergies  Allergen Reactions   Keflex [Cephalexin] Other (See Comments)    "makes pt feel really bad"   Morphine Anxiety and Other (See Comments)    Pt states this made her feel like she was going to die    Family History  Problem Relation Age of Onset   Diabetes Mother     Prior to Admission medications   Medication Sig Start Date End Date Taking? Authorizing Provider  acetaminophen (TYLENOL) 500 MG tablet Take 2 tablets (1,000 mg total) by mouth every 8 (eight) hours. Patient taking differently: Take 1,000 mg by mouth as needed. 09/16/20  Yes Babish, Rodman Key, PA-C  albuterol (VENTOLIN HFA) 108 (90 Base) MCG/ACT inhaler Inhale 2 puffs into the lungs every 4 (four) hours as needed for wheezing or shortness of breath. 06/08/21  Yes [provider]  amiodarone (PACERONE) 200 MG tablet Take 1 tablet (200 mg total) by mouth daily. 12/10/22 01/13/23 Yes Adhikari, Tamsen Meek,  MD  Ascorbic Acid (VITAMIN C) 1000 MG tablet Take 1,000 mg by mouth daily.   Yes [provider]  cetirizine (ZYRTEC) 10 MG tablet Take 10 mg by mouth daily.    Yes [provider]  Cholecalciferol (VITAMIN D3) 125 MCG (5000 UT) TABS Take 5,000 Units by mouth daily.   Yes [provider]  cyanocobalamin 1000 MCG tablet Take 1 tablet by mouth every other day. Vitamin B12   Yes [provider]  diltiazem (CARDIZEM CD) 240 MG 24 hr capsule Take 1 capsule (240 mg total) by mouth daily. 12/05/22   Yes Adhikari, Tamsen Meek, MD  ELIQUIS 5 MG TABS tablet TAKE 1 TABLET BY MOUTH TWICE A DAY 07/16/22  Yes Allred, Jeneen Rinks, MD  ferrous sulfate (FERROUSUL) 325 (65 FE) MG tablet Take 1 tablet (325 mg total) by mouth 3 (three) times daily with meals for 14 days. Patient taking differently: Take 325 mg by mouth 2 (two) times daily with a meal. 09/16/20 01/13/23 Yes Babish, Rodman Key, PA-C  gabapentin (NEURONTIN) 300 MG capsule Take 300 mg by mouth See admin instructions. Take one capsule (300 mg) in the morning and two capsules (600 mg) in the evening.   Yes [provider]  ipratropium-albuterol (DUONEB) 0.5-2.5 (3) MG/3ML SOLN Take 3 mLs by nebulization every 6 (six) hours as needed (shortness of breath). 12/04/22  Yes Shelly Coss, MD  labetalol (NORMODYNE) 200 MG tablet Take 200 mg by mouth 2 (two) times daily.   Yes [provider]  Metamucil Fiber CHEW Chew 1 tablet by mouth daily. After supper   Yes [provider]  methocarbamol (ROBAXIN) 750 MG tablet Take 750 mg by mouth 3 (three) times daily as needed for muscle spasms. 1 tablet in am and 2 tablets in pm 03/06/22  Yes [provider]  mirtazapine (REMERON) 15 MG tablet Take 15 mg by mouth at bedtime. 02/27/22  Yes [provider]  Multiple Vitamin (MULTIVITAMIN WITH MINERALS) TABS tablet Take 1 tablet by mouth daily.   Yes [provider]  Multiple Vitamins-Minerals (PRESERVISION AREDS 2 PO) Take 1 tablet by mouth 2 (two) times daily. [employer], Historical, MD  Omega-3 Fatty Acids (OMEGA-3 FISH OIL PO) Take 2 capsules by mouth daily.   Yes [provider]  pantoprazole (PROTONIX) 40 MG tablet Take 40 mg by mouth daily. 05/13/21  Yes [provider]  pravastatin (PRAVACHOL) 20 MG tablet Take 20 mg by mouth at bedtime.   Yes [provider]  spironolactone (ALDACTONE) 25 MG tablet Take 25 mg by mouth daily.    Yes [provider]  sucralfate  (CARAFATE) 1 g tablet Take 1 g by mouth with breakfast, with lunch, and with evening meal.   Yes [provider]    Physical Exam: Vitals:   01/13/23 2130 01/13/23 2145 01/13/23 2150 01/13/23 2200  BP: (!) 167/76 (!) 166/80  (!) 154/72  Pulse: 66 66 64 62  Resp: '19 20 20 '$ (!) 24  Temp:      TempSrc:      SpO2: (!) 88% 95% 95% 96%  Weight:      Height:        GENERAL: 89 y.o.-year-old white female patient, well-developed, well-nourished sleeping in bed in no acute distress.   HEENT: Head atraumatic, normocephalic. Mucus membranes moist. NECK: Supple. No JVD. CHEST: Normal breath sounds bilaterally. No wheezing, rales, rhonchi or crackles. No use of accessory muscles of respiration. CARDIOVASCULAR: S1, S2 normal. No murmurs,  rubs, or gallops. Cap refill <2 seconds. Pulses intact distally.  ABDOMEN: Soft, nondistended, nontender. No rebound, guarding, rigidity. Normoactive bowel sounds present in all four quadrants.  EXTREMITIES: Right leg splinted. NEUROLOGIC: The patient is arousable. SKIN: Warm, dry, and intact without obvious rash, lesion, or ulcer.    Labs on Admission:  CBC: Recent Labs  Lab 01/13/23 1927  WBC 10.2  NEUTROABS 6.9  HGB 12.7  HCT 40.3  MCV 89.0  PLT 123456   Basic Metabolic Panel: Recent Labs  Lab 01/13/23 1927  NA 133*  K 4.6  CL 99  CO2 22  GLUCOSE 171*  BUN 25*  CREATININE 1.10*  CALCIUM 9.2   GFR: Estimated Creatinine Clearance: 38.3 mL/min (A) (by C-G formula based on SCr of 1.1 mg/dL (H)). Liver Function Tests: No results for input(s): "AST", "ALT", "ALKPHOS", "BILITOT", "PROT", "ALBUMIN" in the last 168 hours. No results for input(s): "LIPASE", "AMYLASE" in the last 168 hours. No results for input(s): "AMMONIA" in the last 168 hours. Coagulation Profile: No results for input(s): "INR", "PROTIME" in the last 168 hours. Cardiac Enzymes: No results for input(s): "CKTOTAL", "CKMB", "CKMBINDEX", "TROPONINI" in the last 168  hours. BNP (last 3 results) No results for input(s): "PROBNP" in the last 8760 hours. HbA1C: No results for input(s): "HGBA1C" in the last 72 hours. CBG: No results for input(s): "GLUCAP" in the last 168 hours. Lipid Profile: No results for input(s): "CHOL", "HDL", "LDLCALC", "TRIG", "CHOLHDL", "LDLDIRECT" in the last 72 hours. Thyroid Function Tests: No results for input(s): "TSH", "T4TOTAL", "FREET4", "T3FREE", "THYROIDAB" in the last 72 hours. Anemia Panel: No results for input(s): "VITAMINB12", "FOLATE", "FERRITIN", "TIBC", "IRON", "RETICCTPCT" in the last 72 hours. Urine analysis:    Component Value Date/Time   COLORURINE STRAW (A) 11/24/2022 0833   APPEARANCEUR CLEAR 11/24/2022 0833   LABSPEC 1.008 11/24/2022 0833   PHURINE 5.0 11/24/2022 0833   GLUCOSEU NEGATIVE 11/24/2022 0833   HGBUR NEGATIVE 11/24/2022 0833   BILIRUBINUR NEGATIVE 11/24/2022 0833   KETONESUR NEGATIVE 11/24/2022 0833   PROTEINUR NEGATIVE 11/24/2022 0833   NITRITE NEGATIVE 11/24/2022 0833   LEUKOCYTESUR NEGATIVE 11/24/2022 0833   Sepsis Labs: '@LABRCNTIP'$ (procalcitonin:4,lacticidven:4) )No results found for this or any previous visit (from the past 240 hour(s)).   Radiological Exams on Admission: DG Tibia/Fibula Right  Result Date: 01/13/2023 CLINICAL DATA:  Status post fall. EXAM: RIGHT TIBIA AND FIBULA - 2 VIEW COMPARISON:  None Available. FINDINGS: Acute, mildly displaced fracture deformities are seen involving the shafts of the distal right tibia and distal right fibula. An additional nondisplaced fracture of the shaft of the proximal right fibula is seen. There is no evidence of dislocation. Soft tissue swelling is seen along the previously noted fracture sites. IMPRESSION: Acute fractures of the right tibia and right fibula, as described above. Electronically Signed   By: Virgina Norfolk M.D.   On: 01/13/2023 20:42   DG Ankle Right Port  Result Date: 01/13/2023 CLINICAL DATA:  Status post fall.  EXAM: PORTABLE RIGHT ANKLE - 2 VIEW COMPARISON:  None Available. FINDINGS: The right ankle was imaged in a fiberglass cast with subsequently obscured osseous detail. Acute, mildly displaced fracture deformities are seen involving the shafts of the distal right tibia and distal right fibula. There is no evidence of dislocation. Soft tissue swelling is seen along the previously noted fracture sites. IMPRESSION: Acute fracture deformities of the distal right tibia and distal right fibula. Electronically Signed   By: Virgina Norfolk M.D.   On: 01/13/2023 20:39  DG Chest Port 1 View  Result Date: 01/13/2023 CLINICAL DATA:  Status post fall. EXAM: PORTABLE CHEST 1 VIEW COMPARISON:  December 02, 2022 FINDINGS: The heart size and mediastinal contours are within normal limits. The lungs are hyperinflated. Mild, stable airspace disease is seen within the right upper lobe. A subcentimeter calcified lung nodule is seen within the lateral aspect of the mid left lung. There is no evidence of a pleural effusion or pneumothorax. The visualized skeletal structures are unremarkable. IMPRESSION: Mild right upper lobe airspace disease, unchanged in severity when compared to the prior study. Electronically Signed   By: Virgina Norfolk M.D.   On: 01/13/2023 20:38      EKG: Normal sinus rhythm at 69 bpm with normal axis and nonspecific ST-T wave changes.   Assessment/Plan  This is a 89 y.o. female with a history of osteoarthritis, atrial fibrillation on Eliquis, GERD, hypertension, hyperlipidemia, TIA/CVA  now being admitted [facility] with:  #.  Right proximal fibula fracture and displaced distal right tib-fib fracture -Admit to [facility], family requesting Dr. Marcelino Scot from Ortho -Orthopedic management per Ortho team -Will hold Eliquis, pharmacy consult for heparin Will check INR  #. Acute kidney injury  -Gentle IV fluids and repeat BMP in AM.   #.  History of atrial fibrillation -Continue amiodarone,  Cardizem -Hold Eliquis - Echo done 11/23 with EF 60-65% -Will request cardiology consultation for preop  #. History of GERD - Continue Protonix, Carafate  #. History of hypertension - Continue labetalol, spironolactone  #. History of hyperlipidemia -Continue pravastatin  #. History of TIA/CVA  #. History of osteo arthritis/chronic pain - Continue gabapentin, Robaxin  Admission status: Inpatient IV Fluids: Normal saline Diet/Nutrition: N.p.o. after midnight Consults called: Ortho Dr. Veverly Fells, Cardio DVT Px: Heparin SCDs and early ambulation. Code Status: Full Code  Disposition Plan: To be determined  All the records are reviewed and case discussed with ED provider. Management plans discussed with the patient and/or family who express understanding and agree with plan of care.  [employer] D.O. on 01/13/2023 at 10:31 PM CC: Primary care physician; Myrlene Broker, MD   01/13/2023, 10:31 PM

## 2023-01-13 NOTE — ED Notes (Signed)
X-ray at bedside

## 2023-01-14 DIAGNOSIS — Z01810 Encounter for preprocedural cardiovascular examination: Secondary | ICD-10-CM | POA: Diagnosis not present

## 2023-01-14 DIAGNOSIS — D72829 Elevated white blood cell count, unspecified: Secondary | ICD-10-CM | POA: Insufficient documentation

## 2023-01-14 DIAGNOSIS — S82201A Unspecified fracture of shaft of right tibia, initial encounter for closed fracture: Secondary | ICD-10-CM | POA: Diagnosis not present

## 2023-01-14 DIAGNOSIS — S82401A Unspecified fracture of shaft of right fibula, initial encounter for closed fracture: Secondary | ICD-10-CM | POA: Diagnosis not present

## 2023-01-14 DIAGNOSIS — K219 Gastro-esophageal reflux disease without esophagitis: Secondary | ICD-10-CM | POA: Insufficient documentation

## 2023-01-14 DIAGNOSIS — N179 Acute kidney failure, unspecified: Secondary | ICD-10-CM | POA: Insufficient documentation

## 2023-01-14 LAB — CBC
HCT: 36.4 % (ref 36.0–46.0)
HCT: 36.6 % (ref 36.0–46.0)
Hemoglobin: 11.5 g/dL — ABNORMAL LOW (ref 12.0–15.0)
Hemoglobin: 11.3 g/dL — ABNORMAL LOW (ref 12.0–15.0)
MCH: 28.2 pg (ref 26.0–34.0)
MCH: 27.9 pg (ref 26.0–34.0)
MCHC: 31.6 g/dL (ref 30.0–36.0)
MCHC: 30.9 g/dL (ref 30.0–36.0)
MCV: 89.2 fL (ref 80.0–100.0)
MCV: 90.4 fL (ref 80.0–100.0)
Platelets: 256 10*3/uL (ref 150–400)
Platelets: 248 10*3/uL (ref 150–400)
RBC: 4.08 MIL/uL (ref 3.87–5.11)
RBC: 4.05 MIL/uL (ref 3.87–5.11)
RDW: 14.3 % (ref 11.5–15.5)
RDW: 14.5 % (ref 11.5–15.5)
WBC: 18.6 10*3/uL — ABNORMAL HIGH (ref 4.0–10.5)
WBC: 15.1 10*3/uL — ABNORMAL HIGH (ref 4.0–10.5)
nRBC: 0 % (ref 0.0–0.2)
nRBC: 0 % (ref 0.0–0.2)

## 2023-01-14 LAB — PROTIME-INR
INR: 1.2 (ref 0.8–1.2)
Prothrombin Time: 15.1 seconds (ref 11.4–15.2)

## 2023-01-14 LAB — APTT
aPTT: 37 seconds — ABNORMAL HIGH (ref 24–36)
aPTT: 88 seconds — ABNORMAL HIGH (ref 24–36)
aPTT: 92 seconds — ABNORMAL HIGH (ref 24–36)

## 2023-01-14 LAB — HEPARIN LEVEL (UNFRACTIONATED): Heparin Unfractionated: >1.1 IU/mL — ABNORMAL HIGH (ref 0.30–0.70)

## 2023-01-14 MED ORDER — GABAPENTIN 300 MG PO CAPS: 300.0000 mg | ORAL_CAPSULE | ORAL | Status: DC

## 2023-01-14 MED ORDER — AMIODARONE HCL 200 MG PO TABS
200.0000 mg | ORAL_TABLET | Freq: Every day | ORAL | Status: DC
  Administered 2023-01-14 – 2023-01-19 (×6): 200 mg via ORAL
  Filled 2023-01-14 (×6): qty 1

## 2023-01-14 MED ORDER — PANTOPRAZOLE SODIUM 40 MG PO TBEC
40.0000 mg | DELAYED_RELEASE_TABLET | Freq: Every day | ORAL | Status: DC
  Administered 2023-01-14 – 2023-01-19 (×5): 40 mg via ORAL
  Filled 2023-01-14 (×5): qty 1

## 2023-01-14 MED ORDER — ONDANSETRON HCL 4 MG/2ML IJ SOLN
4.0000 mg | Freq: Four times a day (QID) | INTRAMUSCULAR | Status: DC | PRN
  Administered 2023-01-14 (×2): 4 mg via INTRAVENOUS
  Filled 2023-01-14 (×4): qty 2

## 2023-01-14 MED ORDER — ADULT MULTIVITAMIN W/MINERALS CH
1.0000 | ORAL_TABLET | Freq: Every day | ORAL | Status: DC
  Administered 2023-01-14 – 2023-01-19 (×5): 1 via ORAL
  Filled 2023-01-14 (×5): qty 1

## 2023-01-14 MED ORDER — VITAMIN B-12 1000 MCG PO TABS
1000.0000 ug | ORAL_TABLET | ORAL | Status: DC
  Administered 2023-01-14 – 2023-01-18 (×3): 1000 ug via ORAL
  Filled 2023-01-14 (×4): qty 1

## 2023-01-14 MED ORDER — SPIRONOLACTONE 25 MG PO TABS
25.0000 mg | ORAL_TABLET | Freq: Every day | ORAL | Status: DC
  Administered 2023-01-14 – 2023-01-19 (×6): 25 mg via ORAL
  Filled 2023-01-14 (×6): qty 1

## 2023-01-14 MED ORDER — SODIUM CHLORIDE 0.9 % IV SOLN
6.2500 mg | Freq: Four times a day (QID) | INTRAVENOUS | Status: DC | PRN
  Administered 2023-01-14 – 2023-01-15 (×3): 6.25 mg via INTRAVENOUS
  Filled 2023-01-14 (×3): qty 0.25

## 2023-01-14 MED ORDER — GABAPENTIN 300 MG PO CAPS
600.0000 mg | ORAL_CAPSULE | Freq: Every day | ORAL | Status: DC
  Administered 2023-01-14 – 2023-01-18 (×6): 600 mg via ORAL
  Filled 2023-01-14 (×6): qty 2

## 2023-01-14 MED ORDER — VITAMIN D 25 MCG (1000 UNIT) PO TABS
5000.0000 [IU] | ORAL_TABLET | Freq: Every day | ORAL | Status: DC
  Administered 2023-01-14 – 2023-01-19 (×5): 5000 [IU] via ORAL
  Filled 2023-01-14 (×5): qty 5

## 2023-01-14 MED ORDER — HEPARIN (PORCINE) 25000 UT/250ML-% IV SOLN
900.0000 [IU]/h | INTRAVENOUS | Status: DC
  Administered 2023-01-14 – 2023-01-15 (×2): 1000 [IU]/h via INTRAVENOUS
  Filled 2023-01-14 (×2): qty 250

## 2023-01-14 MED ORDER — SUCRALFATE 1 G PO TABS
1.0000 g | ORAL_TABLET | Freq: Three times a day (TID) | ORAL | Status: DC
  Administered 2023-01-14 – 2023-01-19 (×12): 1 g via ORAL
  Filled 2023-01-14 (×12): qty 1

## 2023-01-14 MED ORDER — HYDROCODONE-ACETAMINOPHEN 5-325 MG PO TABS
1.0000 | ORAL_TABLET | Freq: Four times a day (QID) | ORAL | Status: DC | PRN
  Administered 2023-01-14 – 2023-01-18 (×6): 2 via ORAL
  Filled 2023-01-14 (×6): qty 2

## 2023-01-14 MED ORDER — METHOCARBAMOL 750 MG PO TABS
750.0000 mg | ORAL_TABLET | Freq: Three times a day (TID) | ORAL | Status: DC | PRN
  Administered 2023-01-14 (×2): 750 mg via ORAL
  Filled 2023-01-14 (×2): qty 2

## 2023-01-14 MED ORDER — SODIUM CHLORIDE 0.9 % IV SOLN: INTRAVENOUS | Status: DC

## 2023-01-14 MED ORDER — PROSIGHT PO TABS
2.0000 | ORAL_TABLET | Freq: Two times a day (BID) | ORAL | Status: DC
  Administered 2023-01-14 – 2023-01-19 (×9): 2 via ORAL
  Filled 2023-01-14 (×10): qty 2

## 2023-01-14 MED ORDER — DOCUSATE SODIUM 100 MG PO CAPS
100.0000 mg | ORAL_CAPSULE | Freq: Two times a day (BID) | ORAL | Status: DC
  Administered 2023-01-14 – 2023-01-18 (×10): 100 mg via ORAL
  Filled 2023-01-14 (×11): qty 1

## 2023-01-14 MED ORDER — VITAMIN C 500 MG PO TABS
1000.0000 mg | ORAL_TABLET | Freq: Every day | ORAL | Status: DC
  Administered 2023-01-14 – 2023-01-19 (×5): 1000 mg via ORAL
  Filled 2023-01-14 (×5): qty 2

## 2023-01-14 MED ORDER — IPRATROPIUM-ALBUTEROL 0.5-2.5 (3) MG/3ML IN SOLN
3.0000 mL | Freq: Four times a day (QID) | RESPIRATORY_TRACT | Status: DC | PRN
  Administered 2023-01-14 – 2023-01-18 (×2): 3 mL via RESPIRATORY_TRACT
  Filled 2023-01-14 (×2): qty 3

## 2023-01-14 MED ORDER — FERROUS SULFATE 325 (65 FE) MG PO TABS
325.0000 mg | ORAL_TABLET | Freq: Two times a day (BID) | ORAL | Status: DC
  Administered 2023-01-14 – 2023-01-17 (×5): 325 mg via ORAL
  Filled 2023-01-14 (×5): qty 1

## 2023-01-14 MED ORDER — DILTIAZEM HCL ER COATED BEADS 120 MG PO CP24
240.0000 mg | ORAL_CAPSULE | Freq: Every day | ORAL | Status: DC
  Administered 2023-01-14 – 2023-01-19 (×6): 240 mg via ORAL
  Filled 2023-01-14 (×6): qty 2

## 2023-01-14 MED ORDER — LORATADINE 10 MG PO TABS
10.0000 mg | ORAL_TABLET | Freq: Every day | ORAL | Status: DC
  Administered 2023-01-14 – 2023-01-19 (×5): 10 mg via ORAL
  Filled 2023-01-14 (×6): qty 1

## 2023-01-14 MED ORDER — PRAVASTATIN SODIUM 40 MG PO TABS
20.0000 mg | ORAL_TABLET | Freq: Every day | ORAL | Status: DC
  Administered 2023-01-14 – 2023-01-18 (×6): 20 mg via ORAL
  Filled 2023-01-14 (×6): qty 1

## 2023-01-14 MED ORDER — ALBUTEROL SULFATE HFA 108 (90 BASE) MCG/ACT IN AERS: 2.0000 | INHALATION_SPRAY | RESPIRATORY_TRACT | Status: DC | PRN

## 2023-01-14 MED ORDER — GABAPENTIN 300 MG PO CAPS
300.0000 mg | ORAL_CAPSULE | Freq: Every day | ORAL | Status: DC
  Administered 2023-01-14 – 2023-01-19 (×6): 300 mg via ORAL
  Filled 2023-01-14 (×6): qty 1

## 2023-01-14 MED ORDER — MIRTAZAPINE 15 MG PO TABS
15.0000 mg | ORAL_TABLET | Freq: Every day | ORAL | Status: DC
  Administered 2023-01-14 – 2023-01-18 (×6): 15 mg via ORAL
  Filled 2023-01-14 (×2): qty 1
  Filled 2023-01-14: qty 2
  Filled 2023-01-14 (×3): qty 1

## 2023-01-14 MED ORDER — HYDROMORPHONE HCL 1 MG/ML IJ SOLN
0.5000 mg | INTRAMUSCULAR | Status: DC | PRN
  Administered 2023-01-14 – 2023-01-15 (×4): 0.5 mg via INTRAVENOUS
  Filled 2023-01-14 (×2): qty 0.5
  Filled 2023-01-14 (×2): qty 1

## 2023-01-14 MED ORDER — CEFAZOLIN SODIUM-DEXTROSE 2-4 GM/100ML-% IV SOLN
2.0000 g | INTRAVENOUS | Status: AC
  Filled 2023-01-14: qty 100

## 2023-01-14 MED ORDER — HEPARIN SODIUM (PORCINE) 5000 UNIT/ML IJ SOLN
5000.0000 [IU] | Freq: Three times a day (TID) | INTRAMUSCULAR | Status: DC
  Filled 2023-01-14: qty 1

## 2023-01-14 MED ORDER — LABETALOL HCL 200 MG PO TABS
200.0000 mg | ORAL_TABLET | Freq: Two times a day (BID) | ORAL | Status: DC
  Administered 2023-01-14 – 2023-01-19 (×12): 200 mg via ORAL
  Filled 2023-01-14: qty 1
  Filled 2023-01-14: qty 2
  Filled 2023-01-14: qty 1
  Filled 2023-01-14: qty 2
  Filled 2023-01-14 (×10): qty 1

## 2023-01-14 NOTE — Progress Notes (Signed)
ANTICOAGULATION CONSULT NOTE - Follow Up Consult  Pharmacy Consult for Heparin Indication: atrial fibrillation  Allergies  Allergen Reactions   Keflex [Cephalexin] Other (See Comments)    "makes pt feel really bad"   Morphine Anxiety and Other (See Comments)    Pt states this made her feel like she was going to die    Patient Measurements: Height: '5\' 4"'$  (162.6 cm) Weight: 86.2 kg (190 lb) IBW/kg (Calculated) : 54.7 Heparin Dosing Weight: 75 kg  Vital Signs: Temp: 97.5 F (36.4 C) (02/26 0607) Temp Source: Oral (02/26 0607) BP: 122/65 (02/26 0930) Pulse Rate: 62 (02/26 0930)  Labs: Recent Labs    01/13/23 1927 01/14/23 0018 01/14/23 0118  HGB 12.7  --  11.5*  HCT 40.3  --  36.4  PLT 304  --  256  APTT  --   --  37*  LABPROT  --   --  15.1  INR  --   --  1.2  HEPARINUNFRC  --  >1.10*  --   CREATININE 1.10*  --   --     Estimated Creatinine Clearance: 38.3 mL/min (A) (by C-G formula based on SCr of 1.1 mg/dL (H)).   Medications:  Infusions:   sodium chloride 50 mL/hr at 01/14/23 0817   heparin 1,000 Units/hr (01/14/23 0817)   promethazine (PHENERGAN) injection (IM or IVPB)      Assessment: 89 yo F on Eliquis at home for Afib.  She is admited post-fall with RLE fracture requiring surgical repair.  Eliquis is being held in anticipation of surgery later this week.  Pharmacy consulted to dose IV heparin for bridge.  Last Eliquis dose on 2/25 at 08:30.  Note falsely elevated heparin level due to recent DOAC.  Will monitor heparin therapy using aptt levels until aptt/heparin levels correlate.   Today, 01/14/2023: aPTT 88 is therapeutic on heparin 1000 units/hr CBC:  Hgb down to 11.3 (near baseline), Plt WNL No bleeding or complications reported    Goal of Therapy:  Heparin level 0.3-0.7 units/ml aPTT 66-102 seconds Monitor platelets by anticoagulation protocol: Yes   Plan:  Continue heparin IV infusion at 1000 units/hr Confirmatory aPTT in 8 hours Daily  aPTT, heparin level, and CBC Follow up timing for procedures    Gretta Arab PharmD, BCPS [facility] main pharmacy [phone] 01/14/2023 10:08 AM

## 2023-01-14 NOTE — Progress Notes (Addendum)
PROGRESS NOTE    [name]  [id number] DOB: 03/18/1933 DOA: 01/13/2023 PCP: Myrlene Broker, MD     Brief Narrative:  [name] is a 89 y.o. female with a known history of osteoarthritis, atrial fibrillation on Eliquis, GERD, hypertension, hyperlipidemia, TIA/CVA presents to the emergency department for evaluation of right lower extremity pain status post fall.  Patient was in a usual state of health until this evening she was getting into a car when she fell landing on her right side.  Patient lives at home with her daughter and is functionally independent with the use of a walker.  She has been getting home physical therapy. In the ED, she was found to have proximal right fibula fracture and distal right tib fib fracture. Orthopedic surgery consulted.   New events last 24 hours / Subjective: Patient seen in the emergency department.  Awaiting transfer to [facility] for surgical intervention.  Her pain is well-controlled on current pain regimen.  Assessment & Plan:   Principal Problem:   Closed fracture of right fibula and tibia Active Problems:   Paroxysmal atrial fibrillation (HCC)   Essential hypertension   Hyperlipidemia   Preoperative cardiovascular examination   AKI (acute kidney injury) (HCC)   Leukocytosis   GERD (gastroesophageal reflux disease)  Right proximal fibula, right distal tib-fib fracture after fall -Transfer to [facility] for orthopedic surgery evaluation -Hold Eliquis, last dose was 2/25 AM -Pain control   AKI -Baseline creatinine 0.7, presented with creatinine 1.1 -Cautious IVF in setting of chronic CHF  -Avoid nephrotoxins   Leukocytosis -Improving.  Leukocytosis in setting of reactive process -Monitor  Paroxysmal A-fib -Amiodarone, Cardizem, labetalol -Hold Eliquis. On IV heparin  -Cardiology consult for preop evaluation  Chronic diastolic heart failure -Without exacerbation  currently -Spironolactone  Hypertension -Cardizem, labetalol, spironolactone  Hyperlipidemia -Pravastatin  GERD -Protonix, Carafate  DVT prophylaxis: Eliquis on hold --> IV heparin  SCDs Start: 01/14/23 0028  Code Status: Full code Family Communication: Daughter and son-in-law at bedside Disposition Plan:  Status is: Inpatient Remains inpatient appropriate because: Transfer to [facility] for surgical evaluation today  Consultants:  Orthopedic surgery Cardiology   Antimicrobials:  Anti-infectives (From admission, onward)    None        Objective: Vitals:   01/14/23 1045 01/14/23 1100 01/14/23 1115 01/14/23 1200  BP: 120/65 137/72 (!) 146/77   Pulse: 65 66 70   Resp: '18 18 20   '$ Temp:    97.6 F (36.4 C)  TempSrc:      SpO2: 99% 100% 97%   Weight:      Height:        Intake/Output Summary (Last 24 hours) at 01/14/2023 1304 Last data filed at 01/13/2023 2151 Gross per 24 hour  Intake 50 ml  Output --  Net 50 ml   Filed Weights   01/13/23 2007  Weight: 86.2 kg    Examination:  General exam: Appears calm and comfortable  Respiratory system: Clear to auscultation. Respiratory effort normal. No respiratory distress. No conversational dyspnea.  Cardiovascular system: S1 & S2 heard, RRR. No murmurs. No pedal edema. Gastrointestinal system: Abdomen is nondistended, soft and nontender. Normal bowel sounds heard. Central nervous system: Alert and oriented. No focal neurological deficits. Speech clear.  Skin: No rashes, lesions or ulcers on exposed skin  Psychiatry: Judgement and insight appear normal. Mood & affect appropriate.   Data Reviewed: I have personally reviewed following labs and imaging studies  CBC: Recent Labs  Lab 01/13/23 1927 01/14/23 0118 01/14/23 1055  WBC 10.2 18.6* 15.1*  NEUTROABS 6.9  --   --   HGB 12.7 11.5* 11.3*  HCT 40.3 36.4 36.6  MCV 89.0 89.2 90.4  PLT 304 256 [id number]   Basic Metabolic Panel: Recent Labs  Lab  01/13/23 1927  NA 133*  K 4.6  CL 99  CO2 22  GLUCOSE 171*  BUN 25*  CREATININE 1.10*  CALCIUM 9.2   GFR: Estimated Creatinine Clearance: 38.3 mL/min (A) (by C-G formula based on SCr of 1.1 mg/dL (H)). Liver Function Tests: No results for input(s): "AST", "ALT", "ALKPHOS", "BILITOT", "PROT", "ALBUMIN" in the last 168 hours. No results for input(s): "LIPASE", "AMYLASE" in the last 168 hours. No results for input(s): "AMMONIA" in the last 168 hours. Coagulation Profile: Recent Labs  Lab 01/14/23 0118  INR 1.2   Cardiac Enzymes: No results for input(s): "CKTOTAL", "CKMB", "CKMBINDEX", "TROPONINI" in the last 168 hours. BNP (last 3 results) No results for input(s): "PROBNP" in the last 8760 hours. HbA1C: No results for input(s): "HGBA1C" in the last 72 hours. CBG: No results for input(s): "GLUCAP" in the last 168 hours. Lipid Profile: No results for input(s): "CHOL", "HDL", "LDLCALC", "TRIG", "CHOLHDL", "LDLDIRECT" in the last 72 hours. Thyroid Function Tests: No results for input(s): "TSH", "T4TOTAL", "FREET4", "T3FREE", "THYROIDAB" in the last 72 hours. Anemia Panel: No results for input(s): "VITAMINB12", "FOLATE", "FERRITIN", "TIBC", "IRON", "RETICCTPCT" in the last 72 hours. Sepsis Labs: No results for input(s): "PROCALCITON", "LATICACIDVEN" in the last 168 hours.  No results found for this or any previous visit (from the past 240 hour(s)).    Radiology Studies: DG Tibia/Fibula Right  Result Date: 01/13/2023 CLINICAL DATA:  Status post fall. EXAM: RIGHT TIBIA AND FIBULA - 2 VIEW COMPARISON:  None Available. FINDINGS: Acute, mildly displaced fracture deformities are seen involving the shafts of the distal right tibia and distal right fibula. An additional nondisplaced fracture of the shaft of the proximal right fibula is seen. There is no evidence of dislocation. Soft tissue swelling is seen along the previously noted fracture sites. IMPRESSION: Acute fractures of the  right tibia and right fibula, as described above. Electronically Signed   By: Virgina Norfolk M.D.   On: 01/13/2023 20:42   DG Ankle Right Port  Result Date: 01/13/2023 CLINICAL DATA:  Status post fall. EXAM: PORTABLE RIGHT ANKLE - 2 VIEW COMPARISON:  None Available. FINDINGS: The right ankle was imaged in a fiberglass cast with subsequently obscured osseous detail. Acute, mildly displaced fracture deformities are seen involving the shafts of the distal right tibia and distal right fibula. There is no evidence of dislocation. Soft tissue swelling is seen along the previously noted fracture sites. IMPRESSION: Acute fracture deformities of the distal right tibia and distal right fibula. Electronically Signed   By: Virgina Norfolk M.D.   On: 01/13/2023 20:39   DG Chest Port 1 View  Result Date: 01/13/2023 CLINICAL DATA:  Status post fall. EXAM: PORTABLE CHEST 1 VIEW COMPARISON:  December 02, 2022 FINDINGS: The heart size and mediastinal contours are within normal limits. The lungs are hyperinflated. Mild, stable airspace disease is seen within the right upper lobe. A subcentimeter calcified lung nodule is seen within the lateral aspect of the mid left lung. There is no evidence of a pleural effusion or pneumothorax. The visualized skeletal structures are unremarkable. IMPRESSION: Mild right upper lobe airspace disease, unchanged in severity when compared to the prior study. Electronically Signed   By: Hoover Browns  [place] M.D.   On: 01/13/2023 20:38      Scheduled Meds:  amiodarone  200 mg Oral Daily   vitamin C  1,000 mg Oral Daily   cholecalciferol  5,000 Units Oral Daily   cyanocobalamin  1,000 mcg Oral QODAY   diltiazem  240 mg Oral Daily   docusate sodium  100 mg Oral BID   ferrous sulfate  325 mg Oral BID WC   gabapentin  300 mg Oral Daily   And   gabapentin  600 mg Oral QHS   labetalol  200 mg Oral BID   loratadine  10 mg Oral Daily   mirtazapine  15 mg Oral QHS   multivitamin  2 tablet  Oral BID WC   multivitamin with minerals  1 tablet Oral Daily   pantoprazole  40 mg Oral Daily   pravastatin  20 mg Oral QHS   spironolactone  25 mg Oral Daily   sucralfate  1 g Oral TID with meals   Continuous Infusions:  sodium chloride 50 mL/hr at 01/14/23 0817   heparin 1,000 Units/hr (01/14/23 0817)   promethazine (PHENERGAN) injection (IM or IVPB) 6.25 mg (01/14/23 1131)     LOS: 1 day   Time spent: 35 minutes   Dessa Phi, DO [facility] 01/14/2023, 1:04 PM   Available via Epic secure chat 7am-7pm After these hours, please refer to coverage provider listed on [email or web address]

## 2023-01-14 NOTE — Progress Notes (Signed)
[place] for Heparin Indication: atrial fibrillation  Allergies  Allergen Reactions   Keflex [Cephalexin] Other (See Comments)    "makes pt feel really bad"   Morphine Anxiety and Other (See Comments)    Pt states this made her feel like she was going to die    Patient Measurements: Height: '5\' 4"'$  (162.6 cm) Weight: 86.2 kg (190 lb) IBW/kg (Calculated) : 54.7 Heparin Dosing Weight: 75kg   Vital Signs: Temp: 98.4 F (36.9 C) (02/26 1600) Temp Source: Oral (02/26 1600) BP: (P) 120/55 (02/26 2000) Pulse Rate: 64 (02/26 1600)  Labs: Recent Labs    01/13/23 1927 01/14/23 0018 01/14/23 0118 01/14/23 1055 01/14/23 1855  HGB 12.7  --  11.5* 11.3*  --   HCT 40.3  --  36.4 36.6  --   PLT 304  --  256 248  --   APTT  --   --  37* 88* 92*  LABPROT  --   --  15.1  --   --   INR  --   --  1.2  --   --   HEPARINUNFRC  --  >1.10*  --   --   --   CREATININE 1.10*  --   --   --   --      Estimated Creatinine Clearance: 38.3 mL/min (A) (by C-G formula based on SCr of 1.1 mg/dL (H)).   Medical History: Past Medical History:  Diagnosis Date   Arthritis    Atrial fibrillation, transient ([place])    post op hip surgery; May 2021   Diverticulitis    Dyspnea    with exertion    GERD (gastroesophageal reflux disease)    Hyperlipidemia    Hypertension    Stroke ([place])    hx opf mini strokes    TIA (transient ischemic attack)     Medications:  Infusions:   sodium chloride 50 mL/hr at 01/14/23 0817   [START ON 01/15/2023]  ceFAZolin (ANCEF) IV     heparin 1,000 Units/hr (01/14/23 0817)   promethazine (PHENERGAN) injection (IM or IVPB) 6.25 mg (01/14/23 1131)    Assessment: 89 yo F on apixaban for Afib - last dose 2/25 @ 0830. She is admited post-fall with RLE fracture requiring surgical repair.  Apixaban is being held in anticipation of surgery 2/27. Pharmacy consulted to dose IV heparin for bridge. Baseline labs: CBC WNL, scr 1.1.  Anticipate elevated heparin level due to recent DOAC. Will monitor heparin therapy using aptt levels until aptt/heparin levels correlate.  PM update - aPTT therapeutic at 92. Heparin level >1.1, as expected due to influence of apixaban. Hg down to 11.3, plt WNL. No bleed issues reported.  Goal of Therapy:  Heparin level 0.3-0.7 units/ml aPTT 66-102 seconds Monitor platelets by anticoagulation protocol: Yes   Plan:  Continue heparin infusion at 1000 units/hr Confirmatory aPTT with AM labs Monitor daily aPTT and heparin level until correlating, CBC, s/sx bleeding F/u plan for resumption of apixaban post-op   Arturo Morton, PharmD, BCPS Please check AMION for all [facility] contact numbers Clinical Pharmacist 01/14/2023 8:04 PM

## 2023-01-14 NOTE — Progress Notes (Signed)
Orthopedics Progress Note  Subjective: No pain in the right leg with the splint on.   Objective:  Vitals:   01/14/23 1400 01/14/23 1430  BP: 137/71 130/86  Pulse: 62 63  Resp: 11 14  Temp:    SpO2: 98% 98%    General: Awake and alert  Musculoskeletal: Long leg splint intact, no pain with wriggling toes. Normal sensation. No pain with active ankle DF/PF on the left Neurovascularly intact  Lab Results  Component Value Date   WBC 15.1 (H) 01/14/2023   HGB 11.3 (L) 01/14/2023   HCT 36.6 01/14/2023   MCV 90.4 01/14/2023   PLT 248 01/14/2023       Component Value Date/Time   NA 133 (L) 01/13/2023 1927   NA 137 07/08/2020 1130   K 4.6 01/13/2023 1927   CL 99 01/13/2023 1927   CO2 22 01/13/2023 1927   GLUCOSE 171 (H) 01/13/2023 1927   BUN 25 (H) 01/13/2023 1927   BUN 17 07/08/2020 1130   CREATININE 1.10 (H) 01/13/2023 1927   CALCIUM 9.2 01/13/2023 1927   GFRNONAA 49 (L) 01/13/2023 1927   GFRAA 75 07/08/2020 1130    Lab Results  Component Value Date   INR 1.2 01/14/2023    Assessment/Plan: Right lower leg injury with displaced tib/fib fracture after fall. Plan per Dr Marcelino Scot is surgery tomorrow afternoon at [place] transport Patient comfortable. Family at the bedside   Doran Heater. Veverly Fells, MD 01/14/2023 3:40 PM

## 2023-01-14 NOTE — ED Notes (Signed)
Pt sleeping, labs drawn, arousable to voice, cardiology into see. Family x2 at [facility].

## 2023-01-14 NOTE — Consult Note (Addendum)
Cardiology Consultation   Patient ID: [name] MRN: [id number]; DOB: 1933-11-08  Admit date: 01/13/2023 Date of Consult: 01/14/2023  PCP:  Myrlene Broker, MD   [place] Providers Cardiologist:  Janina Mayo, MD   {   Patient Profile:   [name] is a 89 y.o. female with a history of  paroxysmal atrial fibrillation on Eliquis, chronic diastolic CHF, hypertension, hyperlipidemia, TIAs, COPD, GERD, and diverticulitis  who is being seen 01/14/2023 for pre-op evaluation at the request of Dr. Maylene Roes.  History of Present Illness:   [name] is a 89 year old femal with the above history. who was previously followed by Dr. Rayann Heman. She has a history of paroxysmal atrial fibrillation and has been maintained on Labetalol and Eliquis as well as PRN Diltiazem. She also has diastolic CHF. She has had a couple of hospitalization over the last few months. She was admitted in 09/2022 for acute hypoxic respiratory failure secondary to COVID pneumonia. She also received a couple of dose of IV Lasix that admission. Echo at that time showed LVEF of 60-65% with no regional wall motion abnormalities, normal RV, left atrial enlargement, and moderately elevated PASP. She was then admitted in 11/2022 for acute on chronic hypoxic respiratory failure due to COPD exacerbation, pneumonia, and influenza. Hospitalization was complicated by rapid atrial fibrillation. She was started on IV Amiodarone and converted back to sinus rhythm and then was discharged on PO Amiodarone.  Patient was seen in the [facility] for follow-up on 12/18/2022 at which time she was in rate controlled atrial fibrillation. She was still deconditioned from recent pneumonia. No medications changes were made at that time. She was also see by Dr. Sol Blazing for follow-up that day as well. Continued shortness of breath was fel to be multifactorial in setting of COPD, group II/III pulmonary hypertension, and deconditioning  with atrial fibrillation possibly contributing as well. DCCV was offered but patient and son declined.  Patient presented back to the [facility] ED on 01/13/2023 after a mechanical fall and was found to have distal tib-fib fracture on the right. Ortho was consulted and patient will need surgery with ORIF vs IM nail given unstable fracture pattern. Cardiology was consulted for pre-ope evaluation.  At the time of this evaluation, patient is resting comfortably in no acute distress. Patient has been doing well from a cardiac standpoint since last office visit. She actually converted back to sinus rhythm a couple of days after that visit. She denies any chest pain. She has chronic dyspnea on exertion from her COPD but this is stable. No orthopnea or PND. She has occasional swelling and pain of right leg due to arthritis in that leg but this is stable. She notes occasional palpitations but none recently. No lightheadedness, dizziness, syncope. We discussed her fall. This was clearly a mechanical fall. She went out to dinner with her family last night and was getting back in the car when her leg gave out on her and she fell. She denies any cardiac symptoms prior to this. She typically ambulates with a walker. She denies any other falls. No abnormal bleeding in urine or stools on Eliquis.  Family states she is really doing very well from a cardiac standpoint. She is mostly limited by her arthritis.   Past Medical History:  Diagnosis Date   Arthritis    Atrial fibrillation, transient ([place])    post op hip surgery; May 2021   Diverticulitis    Dyspnea  with exertion    GERD (gastroesophageal reflux disease)    Hyperlipidemia    Hypertension    Stroke ([place])    hx opf mini strokes    TIA (transient ischemic attack)     Past Surgical History:  Procedure Laterality Date   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/13/2020   Procedure: CONVERSION TO LEFT TOTAL HIP-POSTERIOR APPROACH;  Surgeon: Paralee Cancel, MD;   Location: [facility];  Service: Orthopedics;  Laterality: Left;  90 mins   HIP FRACTURE SURGERY Left 04/16/2020   KNEE SURGERY     WRIST SURGERY       Home Medications:  Prior to Admission medications   Medication Sig Start Date End Date Taking? Authorizing Provider  acetaminophen (TYLENOL) 500 MG tablet Take 2 tablets (1,000 mg total) by mouth every 8 (eight) hours. Patient taking differently: Take 1,000 mg by mouth as needed. 09/16/20  Yes Babish, Rodman Key, PA-C  albuterol (VENTOLIN HFA) 108 (90 Base) MCG/ACT inhaler Inhale 2 puffs into the lungs every 4 (four) hours as needed for wheezing or shortness of breath. 06/08/21  Yes [provider]  amiodarone (PACERONE) 200 MG tablet Take 1 tablet (200 mg total) by mouth daily. 12/10/22 01/13/23 Yes Shelly Coss, MD  Ascorbic Acid (VITAMIN C) 1000 MG tablet Take 1,000 mg by mouth daily.   Yes [provider]  cetirizine (ZYRTEC) 10 MG tablet Take 10 mg by mouth daily.    Yes [provider]  Cholecalciferol (VITAMIN D3) 125 MCG (5000 UT) TABS Take 5,000 Units by mouth daily.   Yes [provider]  cyanocobalamin 1000 MCG tablet Take 1 tablet by mouth every other day. Vitamin B12   Yes [provider]  diltiazem (CARDIZEM CD) 240 MG 24 hr capsule Take 1 capsule (240 mg total) by mouth daily. 12/05/22  Yes Adhikari, Tamsen Meek, MD  ELIQUIS 5 MG TABS tablet TAKE 1 TABLET BY MOUTH TWICE A DAY 07/16/22  Yes Allred, Jeneen Rinks, MD  ferrous sulfate (FERROUSUL) 325 (65 FE) MG tablet Take 1 tablet (325 mg total) by mouth 3 (three) times daily with meals for 14 days. Patient taking differently: Take 325 mg by mouth 2 (two) times daily with a meal. 09/16/20 01/13/23 Yes Babish, Rodman Key, PA-C  gabapentin (NEURONTIN) 300 MG capsule Take 300 mg by mouth See admin instructions. Take one capsule (300 mg) in the morning and two capsules (600 mg) in the evening.   Yes [provider]  ipratropium-albuterol (DUONEB) 0.5-2.5 (3)  MG/3ML SOLN Take 3 mLs by nebulization every 6 (six) hours as needed (shortness of breath). 12/04/22  Yes Shelly Coss, MD  labetalol (NORMODYNE) 200 MG tablet Take 200 mg by mouth 2 (two) times daily.   Yes [provider]  Metamucil Fiber CHEW Chew 1 tablet by mouth daily. After supper   Yes [provider]  methocarbamol (ROBAXIN) 750 MG tablet Take 750 mg by mouth 3 (three) times daily as needed for muscle spasms. 1 tablet in am and 2 tablets in pm 03/06/22  Yes [provider]  mirtazapine (REMERON) 15 MG tablet Take 15 mg by mouth at bedtime. 02/27/22  Yes [provider]  Multiple Vitamin (MULTIVITAMIN WITH MINERALS) TABS tablet Take 1 tablet by mouth daily.   Yes [provider]  Multiple Vitamins-Minerals (PRESERVISION AREDS 2 PO) Take 1 tablet by mouth 2 (two) times daily. [employer], Historical, MD  Omega-3 Fatty Acids (OMEGA-3 FISH OIL PO) Take 2 capsules by mouth daily.  Yes [provider]  pantoprazole (PROTONIX) 40 MG tablet Take 40 mg by mouth daily. 05/13/21  Yes [provider]  pravastatin (PRAVACHOL) 20 MG tablet Take 20 mg by mouth at bedtime.   Yes [provider]  spironolactone (ALDACTONE) 25 MG tablet Take 25 mg by mouth daily.    Yes [provider]  sucralfate (CARAFATE) 1 g tablet Take 1 g by mouth with breakfast, with lunch, and with evening meal.   Yes [provider]    Inpatient Medications: Scheduled Meds:  amiodarone  200 mg Oral Daily   vitamin C  1,000 mg Oral Daily   cholecalciferol  5,000 Units Oral Daily   cyanocobalamin  1,000 mcg Oral QODAY   diltiazem  240 mg Oral Daily   docusate sodium  100 mg Oral BID   ferrous sulfate  325 mg Oral BID WC   gabapentin  300 mg Oral Daily   And   gabapentin  600 mg Oral QHS   labetalol  200 mg Oral BID   loratadine  10 mg Oral Daily   mirtazapine  15 mg Oral QHS   multivitamin  2 tablet Oral BID WC    multivitamin with minerals  1 tablet Oral Daily   pantoprazole  40 mg Oral Daily   pravastatin  20 mg Oral QHS   spironolactone  25 mg Oral Daily   sucralfate  1 g Oral TID with meals   Continuous Infusions:  sodium chloride 50 mL/hr at 01/14/23 0817   heparin 1,000 Units/hr (01/14/23 0817)   promethazine (PHENERGAN) injection (IM or IVPB)     PRN Meds: HYDROcodone-acetaminophen, HYDROmorphone (DILAUDID) injection, ipratropium-albuterol, methocarbamol, ondansetron (ZOFRAN) IV, promethazine (PHENERGAN) injection (IM or IVPB)  Allergies:    Allergies  Allergen Reactions   Keflex [Cephalexin] Other (See Comments)    "makes pt feel really bad"   Morphine Anxiety and Other (See Comments)    Pt states this made her feel like she was going to die    Social History:   Social History   Socioeconomic History   Marital status: Widowed    Spouse name: Not on file   Number of children: Not on file   Years of education: Not on file   Highest education level: Not on file  Occupational History   Not on file  Tobacco Use   Smoking status: Never   Smokeless tobacco: Never  Substance and Sexual Activity   Alcohol use: Never   Drug use: Never   Sexual activity: Not on file  Other Topics Concern   Not on file  Social History Narrative   Lives in [place] but currently staying with daughter in [place].   homemaker   Social Determinants of Health   Financial Resource Strain: Not on file  Food Insecurity: No [place] (11/25/2022)   Hunger Vital Sign    Worried About Running Out of Food in the Last Year: Never true    Ran Out of Food in the Last Year: Never true  Transportation Needs: No Transportation Needs (11/25/2022)   PRAPARE - Hydrologist (Medical): No    Lack of Transportation (Non-Medical): No  Physical Activity: Not on file  Stress: Not on file  Social Connections: Not on file  Intimate Partner Violence: Not At Risk (11/25/2022)    Humiliation, Afraid, Rape, and Kick questionnaire    Fear of Current or Ex-Partner: No    Emotionally Abused: No    Physically Abused:  No    Sexually Abused: No    Family History:   Family History  Problem Relation Age of Onset   Diabetes Mother      ROS:  Please see the history of present illness.  All other ROS reviewed and negative.     Physical Exam/Data:   Vitals:   01/14/23 0845 01/14/23 0900 01/14/23 0915 01/14/23 0930  BP: 129/77 116/63 110/61 122/65  Pulse: 68 62 63 62  Resp: '17 14 12 13  '$ Temp:      TempSrc:      SpO2: 98% 96% 98% 98%  Weight:      Height:        Intake/Output Summary (Last 24 hours) at 01/14/2023 1017 Last data filed at 01/13/2023 2151 Gross per 24 hour  Intake 50 ml  Output --  Net 50 ml      01/13/2023    8:07 PM 12/18/2022    3:58 PM 12/18/2022    1:25 PM  Last 3 Weights  Weight (lbs) 190 lb 188 lb 3.2 oz 188 lb 9.6 oz  Weight (kg) 86.183 kg 85.367 kg 85.548 kg     Body mass index is 32.61 kg/m.  General: 89 y.o. Caucasian female resting comfortably in no acute distress. HEENT: Normocephalic and atraumatic. Sclera clear.  Neck: Supple. No JVD. Heart: RRR. Distinct S1 and S2. No murmurs, gallops, or rubs. Radial pulses 2+ and equal bilaterally. Lungs: No increased work of breathing. Clear to ausculation bilaterally. No wheezes, rhonchi, or rales.  Abdomen: Soft, non-distended, and non-tender to palpation.  Extremities: No left lower extremity edema. Right lower extremity in a splint.    Skin: Warm and dry. Neuro: Alert and oriented x3. No focal deficits. Psych: Normal affect. Responds appropriately.   EKG:  The EKG was personally reviewed and demonstrates:  Normal sinus rhythm, rate 69 bpm, with non-specific T wave changes in leads I and aVL. Telemetry:  Telemetry was personally reviewed and demonstrates:  Normal sinus rhythm with rates in the 60s.  Relevant CV Studies:  Echocardiogram 09/27/2022: Impressions: 1. Left  ventricular ejection fraction, by estimation, is 60 to 65%. The  left ventricle has normal function. The left ventricle has no regional  wall motion abnormalities. Left ventricular diastolic parameters were  normal.   2. Right ventricular systolic function is normal. The right ventricular  size is normal. There is moderately elevated pulmonary artery systolic  pressure.   3. Left atrial size was moderately dilated.   4. The mitral valve is normal in structure. Trivial mitral valve  regurgitation. No evidence of mitral stenosis. Severe mitral annular  calcification.   5. The aortic valve is tricuspid. There is moderate calcification of the  aortic valve. Aortic valve regurgitation is not visualized. Aortic valve  sclerosis/calcification is present, without any evidence of aortic  stenosis.   6. The inferior vena cava is normal in size with greater than 50%  respiratory variability, suggesting right atrial pressure of 3 mmHg.    Laboratory Data:  High Sensitivity Troponin:  No results for input(s): "TROPONINIHS" in the last 720 hours.   Chemistry Recent Labs  Lab 01/13/23 1927  NA 133*  K 4.6  CL 99  CO2 22  GLUCOSE 171*  BUN 25*  CREATININE 1.10*  CALCIUM 9.2  GFRNONAA 49*  ANIONGAP 12    No results for input(s): "PROT", "ALBUMIN", "AST", "ALT", "ALKPHOS", "BILITOT" in the last 168 hours. Lipids No results for input(s): "CHOL", "TRIG", "HDL", "LABVLDL", "LDLCALC", "CHOLHDL" in the  last 168 hours.  Hematology Recent Labs  Lab 01/13/23 1927 01/14/23 0118  WBC 10.2 18.6*  RBC 4.53 4.08  HGB 12.7 11.5*  HCT 40.3 36.4  MCV 89.0 89.2  MCH 28.0 28.2  MCHC 31.5 31.6  RDW 14.3 14.3  PLT 304 256   Thyroid No results for input(s): "TSH", "FREET4" in the last 168 hours.  BNPNo results for input(s): "BNP", "PROBNP" in the last 168 hours.  DDimer No results for input(s): "DDIMER" in the last 168 hours.   Radiology/Studies:  DG Tibia/Fibula Right  Result Date:  01/13/2023 CLINICAL DATA:  Status post fall. EXAM: RIGHT TIBIA AND FIBULA - 2 VIEW COMPARISON:  None Available. FINDINGS: Acute, mildly displaced fracture deformities are seen involving the shafts of the distal right tibia and distal right fibula. An additional nondisplaced fracture of the shaft of the proximal right fibula is seen. There is no evidence of dislocation. Soft tissue swelling is seen along the previously noted fracture sites. IMPRESSION: Acute fractures of the right tibia and right fibula, as described above. Electronically Signed   By: Virgina Norfolk M.D.   On: 01/13/2023 20:42   DG Ankle Right Port  Result Date: 01/13/2023 CLINICAL DATA:  Status post fall. EXAM: PORTABLE RIGHT ANKLE - 2 VIEW COMPARISON:  None Available. FINDINGS: The right ankle was imaged in a fiberglass cast with subsequently obscured osseous detail. Acute, mildly displaced fracture deformities are seen involving the shafts of the distal right tibia and distal right fibula. There is no evidence of dislocation. Soft tissue swelling is seen along the previously noted fracture sites. IMPRESSION: Acute fracture deformities of the distal right tibia and distal right fibula. Electronically Signed   By: Virgina Norfolk M.D.   On: 01/13/2023 20:39   DG Chest Port 1 View  Result Date: 01/13/2023 CLINICAL DATA:  Status post fall. EXAM: PORTABLE CHEST 1 VIEW COMPARISON:  December 02, 2022 FINDINGS: The heart size and mediastinal contours are within normal limits. The lungs are hyperinflated. Mild, stable airspace disease is seen within the right upper lobe. A subcentimeter calcified lung nodule is seen within the lateral aspect of the mid left lung. There is no evidence of a pleural effusion or pneumothorax. The visualized skeletal structures are unremarkable. IMPRESSION: Mild right upper lobe airspace disease, unchanged in severity when compared to the prior study. Electronically Signed   By: Virgina Norfolk M.D.   On:  01/13/2023 20:38     Assessment and Plan:   Pre-Op Evaluation Patient was admitted with a right tib/fib fracture after a mechanical falls. She needs surgical repair with ORIF vs IM nail given unstable fracture pattern. Cardiology consulted for pre-op evaluation. She is doing well from a cardiac standpoint. She has chronic dyspnea on exertion due to COPD but this is stable. No angina, acute CHF symptoms, palpitations, dizziness, or syncope. Per Revised Cardiac Risk Index, considered moderate risk with 6.6% chance of adverse cardiac events perioperatively. given history of CHF and TIA. She is not quite able to complete 4.0 METS. She ambulates with a walker and is mostly limited by her arthritis. However, she is well optimized from a cardiac standpoint. I don't think any additional cardiac testing is necessary prior to surgery but will discuss with MD and make sure Myoview is not necessary.  Paroxysmal Atrial Fibrillation Maintaining sinus rhythm.  - Continue Amiodarone '200mg'$  daily. - Continue Diltiazem '240mg'$  daily. - Continue Labetolol '200mg'$  twice daily. - On chronic anticoagulation with Eliquis at home but this is currently on  hold in anticipation for surgery. Restart when OK with Ortho.  Chronic Diastolic CHF Echo in [id number] showed LVEF of 60-65% with no regional wall motion abnormalities and normal diastolic parameters.  - Euvolemic on exam. - Continue home Spironolactone and Labetalol. - Continue to monitor volume status closely peri-operatively. Would be cautious with IV fluids.  Hypertension BP initially elevated with systolic BP as high as the 180s but this may of been secondary to pain. BP has been well controlled since midnight. - Continue home medications: Diltiazem '240mg'$  daily, Labetalol '200mg'$  twice daily, and Spironolactone '25mg'$  daily.  Hyperlipidemia - Continue Pravastatin '20mg'$  daily.  Otherwise, per primary team: - Right proximal fibula fracture and displaced right tib/fib  fracture - Osteoarthritis - Chronic pain - AKI - GERD - History of TIA   Risk Assessment/Risk Scores:    New York Heart Association (NYHA) Functional Class NYHA Class II-III  CHA2DS2-VASc Score = 8  This indicates a 10.8% annual risk of stroke. The patient's score is based upon: CHF History: 1 HTN History: 1 Diabetes History: 1 Stroke History: 2 (TIA) Vascular Disease History: 0 Age Score: 2 Gender Score: 1    For questions or updates, please contact [place] Please consult [email or web address] for contact info under    Signed, Darreld Mclean, PA-C  01/14/2023 10:17 AM   Pt seen and examined   I agree with findings as noted above by C Goodrich Pt is an 89 yo with hx of PAF, HFpEF, HTN, HL, TIAs, COPD (recent admit for COPD exacerbation;  Hospitalization complicated by Afib with RVR   Pt Rx with amiodarone; Pt / son declined DCCV).  Presents to [facility] ER after fall with tib/fib fracture   Asked to see for preop cardiac risk assessment   The pt is not very active   Walks around house    Went to [place]inc the other day  Got SOB walking in but no CP       She denies CP    ON exam,  Pt appears mildly uncomfortable in bed due to leg pain Neck:  JVP is normal Lungs are without rales or wheezes,   Moving air Cardiac exam   RRR  No S3   II/VI systolic murmur  ABd is supple    Ext are without edema on L   R in brace  EKG   NSR 69 bpm    Echo in Nov 2023 LVEF 60 to 65%  RVEF normal  Moderate pulmonary HTN  Pt's low function level limits evaluation (probably performs 3 METS)   She denies angina  Currently in SR with no evidence of CHF Recent admit in Jan 2024 she developed afib with RVR and did not develop signs of coronary ischemia   Overall I think she is probably at low risk for major cardiac event with planned surgery for leg.      Avoid hypotension.  Avoid volume increase.   Rx pain      I would keep on amiodarone, IV if needed      I would continue telemetry post  procedure.  Dorris Carnes MD

## 2023-01-14 NOTE — Progress Notes (Signed)
ANTICOAGULATION CONSULT NOTE - Initial Consult  Pharmacy Consult for Heparin Indication: atrial fibrillation  Allergies  Allergen Reactions   Keflex [Cephalexin] Other (See Comments)    "makes pt feel really bad"   Morphine Anxiety and Other (See Comments)    Pt states this made her feel like she was going to die    Patient Measurements: Height: '5\' 4"'$  (162.6 cm) Weight: 86.2 kg (190 lb) IBW/kg (Calculated) : 54.7 Heparin Dosing Weight: 75kg   Vital Signs: Temp: 97.7 F (36.5 C) (02/25 2007) Temp Source: Oral (02/25 2007) BP: 140/68 (02/26 0036) Pulse Rate: 71 (02/26 0036)  Labs: Recent Labs    01/13/23 1927  HGB 12.7  HCT 40.3  PLT 304  CREATININE 1.10*    Estimated Creatinine Clearance: 38.3 mL/min (A) (by C-G formula based on SCr of 1.1 mg/dL (H)).   Medical History: Past Medical History:  Diagnosis Date   Arthritis    Atrial fibrillation, transient ([place])    post op hip surgery; May 2021   Diverticulitis    Dyspnea    with exertion    GERD (gastroesophageal reflux disease)    Hyperlipidemia    Hypertension    Stroke ([place])    hx opf mini strokes    TIA (transient ischemic attack)     Medications:  Infusions:   sodium chloride     promethazine (PHENERGAN) injection (IM or IVPB) Stopped (01/13/23 2151)    Assessment: 89 yo F on Eliquis for Afib - last dose 2/25 @ 0830. She is admited post-fall with RLE fracture requiring surgical repair.  Eliquis is being held in anticipation of surgery later this week.  Pharmacy consulted to dose IV heparin for bridge.  Baseline labs: CBC WNL, scr 1.1, coags pending- anticipate elevated heparin level due to recent DOAC. Will monitor heparin therapy using aptt levels until aptt/heparin levels correlate.   Goal of Therapy:  Heparin level 0.3-0.7 units/ml aPTT 66-102 seconds Monitor platelets by anticoagulation protocol: Yes   Plan:  Start IV heparin infusion at 1000 units/hr.  No bolus. Check aptt 8hrs after  infusion started Daily heparin level & CBC while on heparin F/U surgery timing  Netta Cedars PharmD 01/14/2023,12:48 AM

## 2023-01-15 ENCOUNTER — Inpatient Hospital Stay (HOSPITAL_COMMUNITY): Payer: Medicare Other | Admitting: Anesthesiology

## 2023-01-15 ENCOUNTER — Inpatient Hospital Stay (HOSPITAL_COMMUNITY): Payer: Medicare Other

## 2023-01-15 ENCOUNTER — Other Ambulatory Visit: Payer: Self-pay

## 2023-01-15 ENCOUNTER — Encounter (HOSPITAL_COMMUNITY)
Admission: EM | Disposition: A | Discharge status: Skilled Nursing Facility | Payer: Self-pay | Source: Home / Self Care | Attending: Internal Medicine

## 2023-01-15 ENCOUNTER — Encounter (HOSPITAL_COMMUNITY): Payer: Self-pay | Admitting: Family Medicine

## 2023-01-15 DIAGNOSIS — I10 Essential (primary) hypertension: Secondary | ICD-10-CM

## 2023-01-15 DIAGNOSIS — S82201A Unspecified fracture of shaft of right tibia, initial encounter for closed fracture: Secondary | ICD-10-CM

## 2023-01-15 DIAGNOSIS — S82401A Unspecified fracture of shaft of right fibula, initial encounter for closed fracture: Secondary | ICD-10-CM | POA: Diagnosis not present

## 2023-01-15 DIAGNOSIS — Z8673 Personal history of transient ischemic attack (TIA), and cerebral infarction without residual deficits: Secondary | ICD-10-CM | POA: Diagnosis not present

## 2023-01-15 DIAGNOSIS — I4891 Unspecified atrial fibrillation: Secondary | ICD-10-CM

## 2023-01-15 DIAGNOSIS — Z01810 Encounter for preprocedural cardiovascular examination: Secondary | ICD-10-CM

## 2023-01-15 HISTORY — PX: TIBIA IM NAIL INSERTION: SHX2516

## 2023-01-15 LAB — CBC
HCT: 30.9 % — ABNORMAL LOW (ref 36.0–46.0)
Hemoglobin: 9.7 g/dL — ABNORMAL LOW (ref 12.0–15.0)
MCH: 27.9 pg (ref 26.0–34.0)
MCHC: 31.4 g/dL (ref 30.0–36.0)
MCV: 88.8 fL (ref 80.0–100.0)
Platelets: 220 10*3/uL (ref 150–400)
RBC: 3.48 MIL/uL — ABNORMAL LOW (ref 3.87–5.11)
RDW: 14.4 % (ref 11.5–15.5)
WBC: 11.9 10*3/uL — ABNORMAL HIGH (ref 4.0–10.5)
nRBC: 0 % (ref 0.0–0.2)

## 2023-01-15 LAB — BASIC METABOLIC PANEL
Anion gap: 8 (ref 5–15)
BUN: 27 mg/dL — ABNORMAL HIGH (ref 8–23)
CO2: 24 mmol/L (ref 22–32)
Calcium: 8.6 mg/dL — ABNORMAL LOW (ref 8.9–10.3)
Chloride: 100 mmol/L (ref 98–111)
Creatinine, Ser: 0.93 mg/dL (ref 0.44–1.00)
GFR, Estimated: 59 mL/min — ABNORMAL LOW (ref >60)
Glucose, Bld: 113 mg/dL — ABNORMAL HIGH (ref 70–99)
Potassium: 5.2 mmol/L — ABNORMAL HIGH (ref 3.5–5.1)
Sodium: 132 mmol/L — ABNORMAL LOW (ref 135–145)

## 2023-01-15 LAB — APTT: aPTT: 106 seconds — ABNORMAL HIGH (ref 24–36)

## 2023-01-15 LAB — HEPARIN LEVEL (UNFRACTIONATED): Heparin Unfractionated: >1.1 IU/mL — ABNORMAL HIGH (ref 0.30–0.70)

## 2023-01-15 SURGERY — INSERTION, INTRAMEDULLARY ROD, TIBIA
Anesthesia: General | Site: Leg Lower | Laterality: Right

## 2023-01-15 MED ORDER — DEXAMETHASONE SODIUM PHOSPHATE 4 MG/ML IJ SOLN
INTRAMUSCULAR | Status: DC | PRN
  Administered 2023-01-15: 4 mg via INTRAVENOUS

## 2023-01-15 MED ORDER — HEPARIN (PORCINE) 25000 UT/250ML-% IV SOLN
900.0000 [IU]/h | INTRAVENOUS | Status: DC
  Administered 2023-01-15: 900 [IU]/h via INTRAVENOUS
  Filled 2023-01-15: qty 250

## 2023-01-15 MED ORDER — OXYCODONE HCL 5 MG PO TABS: 5.0000 mg | ORAL_TABLET | Freq: Once | ORAL | Status: DC | PRN

## 2023-01-15 MED ORDER — CEFAZOLIN SODIUM-DEXTROSE 2-3 GM-%(50ML) IV SOLR
INTRAVENOUS | Status: DC | PRN
  Administered 2023-01-15: 2 g via INTRAVENOUS

## 2023-01-15 MED ORDER — FENTANYL CITRATE (PF) 100 MCG/2ML IJ SOLN
INTRAMUSCULAR | Status: DC | PRN
  Administered 2023-01-15: 50 ug via INTRAVENOUS

## 2023-01-15 MED ORDER — LACTATED RINGERS IV SOLN: INTRAVENOUS | Status: DC

## 2023-01-15 MED ORDER — ROCURONIUM BROMIDE 10 MG/ML (PF) SYRINGE
PREFILLED_SYRINGE | INTRAVENOUS | Status: AC
  Filled 2023-01-15: qty 10

## 2023-01-15 MED ORDER — SODIUM ZIRCONIUM CYCLOSILICATE 5 G PO PACK
5.0000 g | PACK | ORAL | Status: AC
  Administered 2023-01-15: 5 g via ORAL
  Filled 2023-01-15: qty 1

## 2023-01-15 MED ORDER — PROPOFOL 10 MG/ML IV BOLUS
INTRAVENOUS | Status: AC
  Filled 2023-01-15: qty 20

## 2023-01-15 MED ORDER — ORAL CARE MOUTH RINSE: 15.0000 mL | Freq: Once | OROMUCOSAL | Status: AC

## 2023-01-15 MED ORDER — CHLORHEXIDINE GLUCONATE 0.12 % MT SOLN
OROMUCOSAL | Status: AC
  Filled 2023-01-15: qty 15

## 2023-01-15 MED ORDER — LIDOCAINE 2% (20 MG/ML) 5 ML SYRINGE
INTRAMUSCULAR | Status: DC | PRN
  Administered 2023-01-15: 60 mg via INTRAVENOUS

## 2023-01-15 MED ORDER — STERILE WATER FOR IRRIGATION IR SOLN
Status: DC | PRN
  Administered 2023-01-15: 1000 mL

## 2023-01-15 MED ORDER — CEFAZOLIN SODIUM-DEXTROSE 2-4 GM/100ML-% IV SOLN
2.0000 g | Freq: Three times a day (TID) | INTRAVENOUS | Status: AC
  Administered 2023-01-15 – 2023-01-16 (×3): 2 g via INTRAVENOUS
  Filled 2023-01-15 (×3): qty 100

## 2023-01-15 MED ORDER — ONDANSETRON HCL 4 MG/2ML IJ SOLN
INTRAMUSCULAR | Status: DC | PRN
  Administered 2023-01-15: 4 mg via INTRAVENOUS

## 2023-01-15 MED ORDER — DEXAMETHASONE SODIUM PHOSPHATE 10 MG/ML IJ SOLN
INTRAMUSCULAR | Status: AC
  Filled 2023-01-15: qty 1

## 2023-01-15 MED ORDER — LIDOCAINE 2% (20 MG/ML) 5 ML SYRINGE
INTRAMUSCULAR | Status: AC
  Filled 2023-01-15: qty 5

## 2023-01-15 MED ORDER — EPHEDRINE SULFATE-NACL 50-0.9 MG/10ML-% IV SOSY
PREFILLED_SYRINGE | INTRAVENOUS | Status: DC | PRN
  Administered 2023-01-15 (×2): 5 mg via INTRAVENOUS

## 2023-01-15 MED ORDER — ENSURE ENLIVE PO LIQD
237.0000 mL | Freq: Two times a day (BID) | ORAL | Status: DC
  Administered 2023-01-16 – 2023-01-19 (×5): 237 mL via ORAL

## 2023-01-15 MED ORDER — BUPIVACAINE-EPINEPHRINE (PF) 0.5% -1:200000 IJ SOLN
INTRAMUSCULAR | Status: DC | PRN
  Administered 2023-01-15: 13.4 mL via PERINEURAL
  Administered 2023-01-15: 6.6 mL via PERINEURAL

## 2023-01-15 MED ORDER — PHENYLEPHRINE HCL-NACL 20-0.9 MG/250ML-% IV SOLN
INTRAVENOUS | Status: DC | PRN
  Administered 2023-01-15: 45 ug/min via INTRAVENOUS

## 2023-01-15 MED ORDER — PHENYLEPHRINE HCL (PRESSORS) 10 MG/ML IV SOLN
INTRAVENOUS | Status: DC | PRN
  Administered 2023-01-15: 80 ug via INTRAVENOUS

## 2023-01-15 MED ORDER — FENTANYL CITRATE (PF) 250 MCG/5ML IJ SOLN
INTRAMUSCULAR | Status: AC
  Filled 2023-01-15: qty 5

## 2023-01-15 MED ORDER — CHLORHEXIDINE GLUCONATE 0.12 % MT SOLN
OROMUCOSAL | Status: AC
  Administered 2023-01-15: 15 mL via OROMUCOSAL
  Filled 2023-01-15: qty 15

## 2023-01-15 MED ORDER — ACETAMINOPHEN 500 MG PO TABS
1000.0000 mg | ORAL_TABLET | Freq: Once | ORAL | Status: AC
  Administered 2023-01-15: 1000 mg via ORAL
  Filled 2023-01-15: qty 2

## 2023-01-15 MED ORDER — ONDANSETRON HCL 4 MG/2ML IJ SOLN
INTRAMUSCULAR | Status: AC
  Filled 2023-01-15: qty 2

## 2023-01-15 MED ORDER — SUGAMMADEX SODIUM 200 MG/2ML IV SOLN
INTRAVENOUS | Status: DC | PRN
  Administered 2023-01-15: 160 mg via INTRAVENOUS

## 2023-01-15 MED ORDER — CHLORHEXIDINE GLUCONATE 0.12 % MT SOLN: 15.0000 mL | OROMUCOSAL | Status: AC

## 2023-01-15 MED ORDER — FENTANYL CITRATE (PF) 100 MCG/2ML IJ SOLN
INTRAMUSCULAR | Status: AC
  Filled 2023-01-15: qty 2

## 2023-01-15 MED ORDER — ROCURONIUM BROMIDE 10 MG/ML (PF) SYRINGE
PREFILLED_SYRINGE | INTRAVENOUS | Status: DC | PRN
  Administered 2023-01-15: 60 mg via INTRAVENOUS

## 2023-01-15 MED ORDER — OXYCODONE HCL 5 MG/5ML PO SOLN: 5.0000 mg | Freq: Once | ORAL | Status: DC | PRN

## 2023-01-15 MED ORDER — PROPOFOL 10 MG/ML IV BOLUS
INTRAVENOUS | Status: DC | PRN
  Administered 2023-01-15: 120 mg via INTRAVENOUS

## 2023-01-15 MED ORDER — 0.9 % SODIUM CHLORIDE (POUR BTL) OPTIME
TOPICAL | Status: DC | PRN
  Administered 2023-01-15: 1000 mL

## 2023-01-15 MED ORDER — FENTANYL CITRATE (PF) 100 MCG/2ML IJ SOLN: 25.0000 ug | INTRAMUSCULAR | Status: DC | PRN

## 2023-01-15 MED ORDER — BUPIVACAINE LIPOSOME 1.3 % IJ SUSP
INTRAMUSCULAR | Status: DC | PRN
  Administered 2023-01-15: 6.7 mL via PERINEURAL
  Administered 2023-01-15: 3.3 mL via PERINEURAL

## 2023-01-15 MED ORDER — CHLORHEXIDINE GLUCONATE 0.12 % MT SOLN: 15.0000 mL | Freq: Once | OROMUCOSAL | Status: AC

## 2023-01-15 SURGICAL SUPPLY — 62 items
BAG COUNTER SPONGE SURGICOUNT (BAG) ×1 IMPLANT
BAG SPNG CNTER NS LX DISP (BAG) ×1
BIT DRILL 3.8X6 NS (BIT) IMPLANT
BIT DRILL 4.4 NS (BIT) IMPLANT
BLADE SURG 10 STRL SS (BLADE) ×1 IMPLANT
BNDG ELASTIC 4X5.8 VLCR STR LF (GAUZE/BANDAGES/DRESSINGS) ×1 IMPLANT
BNDG ELASTIC 6X5.8 VLCR STR LF (GAUZE/BANDAGES/DRESSINGS) ×1 IMPLANT
BNDG GAUZE DERMACEA FLUFF 4 (GAUZE/BANDAGES/DRESSINGS) IMPLANT
BNDG GZE DERMACEA 4 6PLY (GAUZE/BANDAGES/DRESSINGS) ×1
BRUSH SCRUB EZ PLAIN DRY (MISCELLANEOUS) ×2 IMPLANT
COVER SURGICAL LIGHT HANDLE (MISCELLANEOUS) ×2 IMPLANT
DRAPE C-ARM 42X72 X-RAY (DRAPES) ×1 IMPLANT
DRAPE C-ARMOR (DRAPES) ×1 IMPLANT
DRAPE HALF SHEET 40X57 (DRAPES) IMPLANT
DRAPE INCISE IOBAN 66X45 STRL (DRAPES) IMPLANT
DRAPE U-SHAPE 47X51 STRL (DRAPES) ×1 IMPLANT
DRSG ADAPTIC 3X8 NADH LF (GAUZE/BANDAGES/DRESSINGS) ×1 IMPLANT
DRSG MEPILEX POST OP 4X8 (GAUZE/BANDAGES/DRESSINGS) IMPLANT
DRSG MEPITEL 4X7.2 (GAUZE/BANDAGES/DRESSINGS) ×1 IMPLANT
ELECT REM PT RETURN 9FT ADLT (ELECTROSURGICAL) ×1
ELECTRODE REM PT RTRN 9FT ADLT (ELECTROSURGICAL) ×1 IMPLANT
GAUZE PAD ABD 7.5X8 STRL (GAUZE/BANDAGES/DRESSINGS) IMPLANT
GAUZE PAD ABD 8X10 STRL (GAUZE/BANDAGES/DRESSINGS) ×2 IMPLANT
GAUZE SPONGE 4X4 12PLY STRL (GAUZE/BANDAGES/DRESSINGS) ×1 IMPLANT
GLOVE BIO SURGEON STRL SZ7.5 (GLOVE) ×1 IMPLANT
GLOVE BIO SURGEON STRL SZ8 (GLOVE) ×1 IMPLANT
GLOVE BIO SURGEON STRL SZ8.5 (GLOVE) ×1 IMPLANT
GLOVE BIOGEL PI IND STRL 7.5 (GLOVE) ×1 IMPLANT
GLOVE BIOGEL PI IND STRL 8 (GLOVE) ×1 IMPLANT
GLOVE SURG ORTHO LTX SZ7.5 (GLOVE) ×2 IMPLANT
GOWN STRL REUS W/ TWL LRG LVL3 (GOWN DISPOSABLE) ×2 IMPLANT
GOWN STRL REUS W/ TWL XL LVL3 (GOWN DISPOSABLE) ×1 IMPLANT
GOWN STRL REUS W/TWL LRG LVL3 (GOWN DISPOSABLE) ×2
GOWN STRL REUS W/TWL XL LVL3 (GOWN DISPOSABLE) ×1
GUIDEWIRE BALL NOSE 80CM (WIRE) IMPLANT
KIT BASIN OR (CUSTOM PROCEDURE TRAY) ×1 IMPLANT
KIT TURNOVER KIT B (KITS) ×1 IMPLANT
NAIL IM TIBIAL 10X34.5 (Orthopedic Implant) IMPLANT
PACK ORTHO EXTREMITY (CUSTOM PROCEDURE TRAY) ×1 IMPLANT
PAD ARMBOARD 7.5X6 YLW CONV (MISCELLANEOUS) ×2 IMPLANT
PAD CAST 4YDX4 CTTN HI CHSV (CAST SUPPLIES) ×1 IMPLANT
PADDING CAST COTTON 4X4 STRL (CAST SUPPLIES) ×1
PADDING CAST COTTON 6X4 STRL (CAST SUPPLIES) ×1 IMPLANT
PADDING CAST SYNTHETIC 3X4 NS (CAST SUPPLIES) IMPLANT
SCREW ACECAP 38MM (Screw) IMPLANT
SCREW ACECAP 50MM (Screw) IMPLANT
SCREW PROXIMAL DEPUY (Screw) ×2 IMPLANT
SCREW PRXML FT 50X5.5XLCK NS (Screw) IMPLANT
SCREW PRXML FT 60X5.5XNS LF (Screw) IMPLANT
SPLINT PLASTER CAST XFAST 5X30 (CAST SUPPLIES) IMPLANT
SPONGE T-LAP 18X18 ~~LOC~~+RFID (SPONGE) IMPLANT
STAPLER VISISTAT 35W (STAPLE) ×1 IMPLANT
SUT ETHILON 2 0 FS 18 (SUTURE) ×2 IMPLANT
SUT ETHILON 3 0 PS 1 (SUTURE) IMPLANT
SUT VIC AB 0 CT1 27 (SUTURE)
SUT VIC AB 0 CT1 27XBRD ANBCTR (SUTURE) IMPLANT
SUT VIC AB 2-0 CT1 27 (SUTURE) ×1
SUT VIC AB 2-0 CT1 TAPERPNT 27 (SUTURE) ×1 IMPLANT
TIBIAL NAIL 10X34.5 (Orthopedic Implant) ×1 IMPLANT
TOWEL GREEN STERILE (TOWEL DISPOSABLE) ×2 IMPLANT
TOWEL GREEN STERILE FF (TOWEL DISPOSABLE) ×1 IMPLANT
YANKAUER SUCT BULB TIP NO VENT (SUCTIONS) IMPLANT

## 2023-01-15 NOTE — Progress Notes (Signed)
PROGRESS NOTE    [name]  [id number] DOB: [birth year] DOA: 01/13/2023 PCP: Myrlene Broker, MD   Brief Narrative:  [name] is a 89 y.o. female with a known history of osteoarthritis, atrial fibrillation on Eliquis, GERD, hypertension, hyperlipidemia, TIA/CVA presents to the emergency department for evaluation of right lower extremity pain status post fall.  Patient was in a usual state of health until this evening she was getting into a car when she fell landing on her right side.  Patient lives at home with her daughter and is functionally independent with the use of a walker.  She has been getting home physical therapy. In the ED, she was found to have proximal right fibula fracture and distal right tib fib fracture.  Hospitalist called for admission, orthopedic surgery consulted.    Assessment & Plan:   Principal Problem:   Closed fracture of right fibula and tibia Active Problems:   Paroxysmal atrial fibrillation (HCC)   Essential hypertension   Hyperlipidemia   Preoperative cardiovascular examination   AKI (acute kidney injury) (HCC)   Leukocytosis   GERD (gastroesophageal reflux disease)   Preop cardiovascular exam   Right proximal fibula, right distal tib-fib fracture after fall -Tentative plan for ORIF per orthopedic surgery later today -Hold Eliquis, last dose was 2/25 AM - on heparin gtt - defer to surgery for perioperative timing on heparin -Pain controlled well on current regimen - defer to ortho for pain regimen post-op   AKI, resolved -Baseline creatinine 0.7, presented with creatinine 1.1 -IV fluids in the setting of n.p.o. status, monitor closely in the setting of heart failure history   Leukocytosis, likely reactive -Downtrending appropriately no signs or symptoms of infection, hold antibiotics(other than perioperative antibiotics per Ortho)   Paroxysmal A-fib -Home medications include amiodarone, Cardizem, labetalol -Hold Eliquis. On IV  heparin  -Cardiology consulted for preop evaluation, appreciate their insight and recommendations   Chronic diastolic heart failure -Without exacerbation currently -Continue spironolactone -Monitor IV fluid intake/volume status closely as above   Hypertension -Cardizem, labetalol, spironolactone   Hyperlipidemia -Pravastatin   GERD -Protonix, Carafate   DVT prophylaxis: Heparin drip, defer to orthopedic surgery on timeline to transition back to Eliquis Code Status: Full Family Communication: At bedside  Status is: Inpatient  Dispo: The patient is from: Home              Anticipated d/c is to: To be determined              Anticipated d/c date is: 48 to 72 hours pending surgical course, postop ambulatory status and PT recommendations              Patient currently not medically stable for discharge  Consultants:  Orthopedic surgery, cardiology  Procedures:  Right tib-fib fracture repair 01/15/2023  Antimicrobials:  Perioperatively  Subjective: No acute issues or events overnight pain currently well-controlled denies nausea vomiting diarrhea constipation headache fevers chills or chest pain  Objective: Vitals:   01/14/23 1430 01/14/23 1600 01/14/23 2000 01/15/23 0636  BP: 130/86  (!) 120/55 118/89  Pulse: 63 64    Resp: '14 16 20 18  '$ Temp:  98.4 F (36.9 C) 98.3 F (36.8 C) 98.2 F (36.8 C)  TempSrc:  Oral Oral   SpO2: 98% 95% 98% 100%  Weight:      Height:        Intake/Output Summary (Last 24 hours) at 01/15/2023 0741 Last data filed at 01/15/2023 0636 Gross per 24 hour  Intake 830  ml  Output 700 ml  Net 130 ml   Filed Weights   01/13/23 2007  Weight: 86.2 kg    Examination:  General:  Pleasantly resting in bed, No acute distress. HEENT:  Normocephalic atraumatic.  Sclerae nonicteric, noninjected.  Extraocular movements intact bilaterally. Neck:  Without mass or deformity.  Trachea is midline. Lungs:  Clear to auscultate bilaterally without  rhonchi, wheeze, or rales. Heart:  Regular rate and rhythm.  Without murmurs, rubs, or gallops. Abdomen:  Soft, nontender, nondistended.  Without guarding or rebound. Extremities: Without cyanosis, clubbing, edema, right lower extremity bandage clean dry intact  Data Reviewed: I have personally reviewed following labs and imaging studies  CBC: Recent Labs  Lab 01/13/23 1927 01/14/23 0118 01/14/23 1055 01/15/23 0142  WBC 10.2 18.6* 15.1* 11.9*  NEUTROABS 6.9  --   --   --   HGB 12.7 11.5* 11.3* 9.7*  HCT 40.3 36.4 36.6 30.9*  MCV 89.0 89.2 90.4 88.8  PLT 304 256 248 [id number]   Basic Metabolic Panel: Recent Labs  Lab 01/13/23 1927 01/15/23 0142  NA 133* 132*  K 4.6 5.2*  CL 99 100  CO2 22 24  GLUCOSE 171* 113*  BUN 25* 27*  CREATININE 1.10* 0.93  CALCIUM 9.2 8.6*   GFR: Estimated Creatinine Clearance: 45.3 mL/min (by C-G formula based on SCr of 0.93 mg/dL). Liver Function Tests: No results for input(s): "AST", "ALT", "ALKPHOS", "BILITOT", "PROT", "ALBUMIN" in the last 168 hours. No results for input(s): "LIPASE", "AMYLASE" in the last 168 hours. No results for input(s): "AMMONIA" in the last 168 hours. Coagulation Profile: Recent Labs  Lab 01/14/23 0118  INR 1.2   Cardiac Enzymes: No results for input(s): "CKTOTAL", "CKMB", "CKMBINDEX", "TROPONINI" in the last 168 hours. BNP (last 3 results) No results for input(s): "PROBNP" in the last 8760 hours. HbA1C: No results for input(s): "HGBA1C" in the last 72 hours. CBG: No results for input(s): "GLUCAP" in the last 168 hours. Lipid Profile: No results for input(s): "CHOL", "HDL", "LDLCALC", "TRIG", "CHOLHDL", "LDLDIRECT" in the last 72 hours. Thyroid Function Tests: No results for input(s): "TSH", "T4TOTAL", "FREET4", "T3FREE", "THYROIDAB" in the last 72 hours. Anemia Panel: No results for input(s): "VITAMINB12", "FOLATE", "FERRITIN", "TIBC", "IRON", "RETICCTPCT" in the last 72 hours. Sepsis Labs: No results for  input(s): "PROCALCITON", "LATICACIDVEN" in the last 168 hours.  No results found for this or any previous visit (from the past 240 hour(s)).       Radiology Studies: DG Tibia/Fibula Right  Result Date: 01/13/2023 CLINICAL DATA:  Status post fall. EXAM: RIGHT TIBIA AND FIBULA - 2 VIEW COMPARISON:  None Available. FINDINGS: Acute, mildly displaced fracture deformities are seen involving the shafts of the distal right tibia and distal right fibula. An additional nondisplaced fracture of the shaft of the proximal right fibula is seen. There is no evidence of dislocation. Soft tissue swelling is seen along the previously noted fracture sites. IMPRESSION: Acute fractures of the right tibia and right fibula, as described above. Electronically Signed   By: Virgina Norfolk M.D.   On: 01/13/2023 20:42   DG Ankle Right Port  Result Date: 01/13/2023 CLINICAL DATA:  Status post fall. EXAM: PORTABLE RIGHT ANKLE - 2 VIEW COMPARISON:  None Available. FINDINGS: The right ankle was imaged in a fiberglass cast with subsequently obscured osseous detail. Acute, mildly displaced fracture deformities are seen involving the shafts of the distal right tibia and distal right fibula. There is no evidence of dislocation. Soft tissue swelling is seen  along the previously noted fracture sites. IMPRESSION: Acute fracture deformities of the distal right tibia and distal right fibula. Electronically Signed   By: Virgina Norfolk M.D.   On: 01/13/2023 20:39   DG Chest Port 1 View  Result Date: 01/13/2023 CLINICAL DATA:  Status post fall. EXAM: PORTABLE CHEST 1 VIEW COMPARISON:  December 02, 2022 FINDINGS: The heart size and mediastinal contours are within normal limits. The lungs are hyperinflated. Mild, stable airspace disease is seen within the right upper lobe. A subcentimeter calcified lung nodule is seen within the lateral aspect of the mid left lung. There is no evidence of a pleural effusion or pneumothorax. The  visualized skeletal structures are unremarkable. IMPRESSION: Mild right upper lobe airspace disease, unchanged in severity when compared to the prior study. Electronically Signed   By: Virgina Norfolk M.D.   On: 01/13/2023 20:38        Scheduled Meds:  amiodarone  200 mg Oral Daily   vitamin C  1,000 mg Oral Daily   cholecalciferol  5,000 Units Oral Daily   cyanocobalamin  1,000 mcg Oral QODAY   diltiazem  240 mg Oral Daily   docusate sodium  100 mg Oral BID   ferrous sulfate  325 mg Oral BID WC   gabapentin  300 mg Oral Daily   And   gabapentin  600 mg Oral QHS   labetalol  200 mg Oral BID   loratadine  10 mg Oral Daily   mirtazapine  15 mg Oral QHS   multivitamin  2 tablet Oral BID WC   multivitamin with minerals  1 tablet Oral Daily   pantoprazole  40 mg Oral Daily   pravastatin  20 mg Oral QHS   spironolactone  25 mg Oral Daily   sucralfate  1 g Oral TID with meals   Continuous Infusions:  sodium chloride 50 mL/hr at 01/15/23 0056    ceFAZolin (ANCEF) IV     heparin 900 Units/hr (01/15/23 0732)   promethazine (PHENERGAN) injection (IM or IVPB) 6.25 mg (01/14/23 2028)     LOS: 2 days   Time spent: 56mn  Marquerite Forsman C Hallie Ishida, DO [facility]  If 7PM-7AM, please contact night-coverage [email or web address]  01/15/2023, 7:41 AM

## 2023-01-15 NOTE — Anesthesia Procedure Notes (Signed)
Procedure Name: Intubation Date/Time: 01/15/2023 1:37 PM  Performed by: Eulas Post, Breven Guidroz W, CRNAPre-anesthesia Checklist: Patient identified, Emergency Drugs available, Suction available and Patient being monitored Patient Re-evaluated:Patient Re-evaluated prior to induction Oxygen Delivery Method: Circle system utilized Preoxygenation: Pre-oxygenation with 100% oxygen Induction Type: IV induction Ventilation: Mask ventilation without difficulty Laryngoscope Size: Miller and 2 Grade View: Grade I Tube type: Oral Tube size: 7.5 mm Number of attempts: 1 Airway Equipment and Method: Oral airway and Stylet Placement Confirmation: ETT inserted through vocal cords under direct vision, positive ETCO2 and breath sounds checked- equal and bilateral Secured at: 23 cm Tube secured with: Tape Dental Injury: Teeth and Oropharynx as per pre-operative assessment

## 2023-01-15 NOTE — Progress Notes (Signed)
Initial Nutrition Assessment  DOCUMENTATION CODES:   Obesity unspecified  INTERVENTION:  Once diet resumes, recommend: Regular diet Ensure Enlive po BID, each supplement provides 350 kcal and 20 grams of protein.  NUTRITION DIAGNOSIS:   Increased nutrient needs related to post-op healing, other (see comment) (tib-fib fracture) as evidenced by estimated needs.  GOAL:   Patient will meet greater than or equal to 90% of their needs  MONITOR:   Diet advancement, Labs, Weight trends  REASON FOR ASSESSMENT:   Consult Hip fracture protocol  ASSESSMENT:   Pt admitted after a fall leading to R tib-fib fracture. PMH significant for osteoarthritis, afib, GERD, HTN, HLD, TIA/CVA.   Plans for surgical repair today.   Pt eating at her baseline prior to admission. No difficulty chewing or swallowing foods. Does not consume nutrition supplements at home.   States her weight has been stable. Per review of weight history, her weight has remained between 85-87 kg within the last year.   Would likely benefit from the addition of a nutrition supplement following surgery to support post-op healing.   Medications: Vitamin C, Vitamin D3, Vitamin B12, colace, ferrous sulfate, remeron, prosight MVI, MVI, protonix, carafate  Labs: sodium 132, potassium 5.2, BUN 27, GFR 59  NUTRITION - FOCUSED PHYSICAL EXAM:  Flowsheet Row Most Recent Value  Orbital Region No depletion  Upper Arm Region No depletion  Thoracic and Lumbar Region No depletion  Buccal Region No depletion  Temple Region No depletion  Clavicle Bone Region Moderate depletion  Clavicle and Acromion Bone Region No depletion  Scapular Bone Region No depletion  Dorsal Hand Mild depletion  Patellar Region Mild depletion  [unable to assess R leg (in stabilizer)]  Anterior Thigh Region No depletion  Posterior Calf Region No depletion  Edema (RD Assessment) None  Hair Reviewed  Eyes Reviewed  Mouth Other (Comment)  [limited  dentition]  Skin Reviewed  Nails Reviewed       Diet Order:   Diet Order             Diet NPO time specified  Diet effective midnight                   EDUCATION NEEDS:   Education needs have been addressed  Skin:  Skin Assessment: Reviewed RN Assessment  Last BM:  unknown  Height:   Ht Readings from Last 1 Encounters:  01/15/23 '5\' 4"'$  (1.626 m)    Weight:   Wt Readings from Last 1 Encounters:  01/15/23 86.2 kg    Ideal Body Weight:  54.5 kg  BMI:  Body mass index is 32.61 kg/m.  Estimated Nutritional Needs:   Kcal:  1400-1600  Protein:  75-90g  Fluid:  >/=1.5L  Clayborne Dana, RDN, LDN Clinical Nutrition

## 2023-01-15 NOTE — Anesthesia Preprocedure Evaluation (Addendum)
Anesthesia Evaluation  Patient identified by MRN, date of birth, ID band Patient awake    Reviewed: Allergy & Precautions, NPO status , Patient's Chart, lab work & pertinent test results, reviewed documented beta blocker date and time   History of Anesthesia Complications Negative for: history of anesthetic complications  Airway Mallampati: II  TM Distance: >3 FB Neck ROM: Full    Dental no notable dental hx.    Pulmonary COPD,  COPD inhaler   Pulmonary exam normal        Cardiovascular hypertension, Pt. on medications and Pt. on home beta blockers Normal cardiovascular exam+ dysrhythmias (on Eliquis and amiodarone) Atrial Fibrillation      Neuro/Psych CVA    GI/Hepatic ,GERD  Medicated,,  Endo/Other    Renal/GU      Musculoskeletal  (+) Arthritis ,    Abdominal   Peds  Hematology  (+) Blood dyscrasia (Hgb 9.7), anemia   Anesthesia Other Findings   Reproductive/Obstetrics                             Anesthesia Physical Anesthesia Plan  ASA: 3  Anesthesia Plan: General   Post-op Pain Management: Regional block* and Tylenol PO (pre-op)*   Induction: Intravenous  PONV Risk Score and Plan: 3 and Treatment may vary due to age or medical condition, Dexamethasone and Ondansetron  Airway Management Planned: LMA  Additional Equipment: None  Intra-op Plan:   Post-operative Plan: Extubation in OR  Informed Consent: I have reviewed the patients History and Physical, chart, labs and discussed the procedure including the risks, benefits and alternatives for the proposed anesthesia with the patient or authorized representative who has indicated his/her understanding and acceptance.     Dental advisory given  Plan Discussed with: CRNA  Anesthesia Plan Comments: (Consent discussed with patient and her daughter at bedside. Patient has splint up to mid-thigh. Discussed that I will plan on  popliteal and adductor blocks after induction. All questions answered. Daiva Huge, MD)       Anesthesia Quick Evaluation

## 2023-01-15 NOTE — Transfer of Care (Signed)
Immediate Anesthesia Transfer of Care Note  Patient: [name]  Procedure(s) Performed: INTRAMEDULLARY (IM) NAIL  RIGHT TIBIAL (Right: Leg Lower)  Patient Location: PACU  Anesthesia Type:General  Level of Consciousness: drowsy, patient cooperative, and responds to stimulation  Airway & Oxygen Therapy: Patient Spontanous Breathing  Post-op Assessment: Report given to RN and Post -op Vital signs reviewed and stable  Post vital signs: Reviewed and stable  Last Vitals:  Vitals Value Taken Time  BP 115/87   Temp    Pulse 66 01/15/23 1552  Resp 15 01/15/23 1552  SpO2 100 % 01/15/23 1552  Vitals shown include unvalidated device data.  Last Pain:  Vitals:   01/15/23 1300  TempSrc:   PainSc: 0-No pain         Complications: No notable events documented.

## 2023-01-15 NOTE — Anesthesia Postprocedure Evaluation (Signed)
Anesthesia Post Note  Patient: [name]  Procedure(s) Performed: INTRAMEDULLARY (IM) NAIL  RIGHT TIBIAL (Right: Leg Lower)     Patient location during evaluation: PACU Anesthesia Type: General Level of consciousness: awake and alert Pain management: pain level controlled Vital Signs Assessment: post-procedure vital signs reviewed and stable Respiratory status: spontaneous breathing, nonlabored ventilation and respiratory function stable Cardiovascular status: blood pressure returned to baseline Postop Assessment: no apparent nausea or vomiting Anesthetic complications: no   No notable events documented.  Last Vitals:  Vitals:   01/15/23 1600 01/15/23 1615  BP: 118/65 (!) 121/59  Pulse: 67 62  Resp: 20 14  Temp:  36.6 C  SpO2: 100% 96%    Last Pain:  Vitals:   01/15/23 1549  TempSrc:   PainSc: Asleep                 Marthenia Rolling

## 2023-01-15 NOTE — Anesthesia Procedure Notes (Addendum)
Anesthesia Regional Block: Popliteal block   Pre-Anesthetic Checklist: , timeout performed,  Correct Patient, Correct Site, Correct Laterality,  Correct Procedure, Correct Position, site marked,  Risks and benefits discussed,  Pre-op evaluation,  At surgeon's request and post-op pain management  Laterality: Right  Prep: Maximum Sterile Barrier Precautions used, chloraprep       Needles:  Injection technique: Single-shot  Needle Type: Echogenic Stimulator Needle     Needle Length: 9cm  Needle Gauge: 22     Additional Needles:   Procedures:,,,, ultrasound used (permanent image in chart),,    Narrative:  Start time: 01/15/2023 1:40 PM End time: 01/15/2023 1:42 PM Injection made incrementally with aspirations every 5 mL.  Performed by: Personally  Anesthesiologist: Brennan Bailey, MD  Additional Notes: Risks, benefits, and alternative discussed. Patient gave consent for procedure. Patient prepped and draped in sterile fashion. Relevant anatomy identified with ultrasound guidance. Local anesthetic given in 5cc increments with no signs of intravascular injection. Patient monitored throughout procedure with signs of LAST or immediate complications. Tolerated well. Ultrasound image placed in chart.  Tawny Asal, MD

## 2023-01-15 NOTE — Progress Notes (Signed)
ANTICOAGULATION CONSULT NOTE  Pharmacy Consult for Heparin Indication: atrial fibrillation  Allergies  Allergen Reactions   Keflex [Cephalexin] Other (See Comments)    "makes pt feel really bad"  Tolerated Cephalosporin Date: 01/15/23.     Morphine Anxiety and Other (See Comments)    Pt states this made her feel like she was going to die    Patient Measurements: Height: '5\' 4"'$  (162.6 cm) Weight: 86.2 kg (190 lb) IBW/kg (Calculated) : 54.7 Heparin Dosing Weight: 74kg   Vital Signs: Temp: 97.9 F (36.6 C) (02/27 1615) Temp Source: Oral (02/27 1247) BP: 121/59 (02/27 1615) Pulse Rate: 62 (02/27 1615)  Labs: Recent Labs    01/13/23 1927 01/13/23 1927 01/14/23 0018 01/14/23 0118 01/14/23 1055 01/14/23 1855 01/15/23 0142  HGB 12.7  --   --  11.5* 11.3*  --  9.7*  HCT 40.3  --   --  36.4 36.6  --  30.9*  PLT 304  --   --  256 248  --  220  APTT  --    < >  --  37* 88* 92* 106*  LABPROT  --   --   --  15.1  --   --   --   INR  --   --   --  1.2  --   --   --   HEPARINUNFRC  --   --  >1.10*  --   --   --  >1.10*  CREATININE 1.10*  --   --   --   --   --  0.93   < > = values in this interval not displayed.     Estimated Creatinine Clearance: 45.3 mL/min (by C-G formula based on SCr of 0.93 mg/dL).   Medical History: Past Medical History:  Diagnosis Date   Arthritis    Atrial fibrillation, transient ([place])    post op hip surgery; May 2021   Diverticulitis    Dyspnea    with exertion    GERD (gastroesophageal reflux disease)    Hyperlipidemia    Hypertension    Stroke ([place])    hx opf mini strokes    TIA (transient ischemic attack)     Medications:  Infusions:   sodium chloride 50 mL/hr at 01/15/23 0056   [MAR Hold]  ceFAZolin (ANCEF) IV     heparin 900 Units/hr (01/15/23 0732)   lactated ringers     lactated ringers     [MAR Hold] promethazine (PHENERGAN) injection (IM or IVPB) 6.25 mg (01/15/23 0949)    Assessment: 89 yo F on apixaban for Afib -  last dose 2/25 @ 0830. She is admited post-fall with RLE fracture requiring surgical repair.  Apixaban is being held in anticipation of surgery 2/27. Pharmacy consulted to dose IV heparin for bridge.   Pt s/p ortho surgery, ok to resume heparin tonight at 2200 per surgery.  Goal of Therapy:  Heparin level 0.3-0.7 units/ml aPTT 66-102 seconds Monitor platelets by anticoagulation protocol: Yes   Plan:  Resume heparin 900 units/h no bolus at 2200 Check aPTT and heparin level at 0600  Arrie Senate, PharmD, [place], [facility] Clinical Pharmacist [phone] Please check AMION for all [place] numbers 01/15/2023

## 2023-01-15 NOTE — Anesthesia Procedure Notes (Addendum)
Anesthesia Regional Block: Adductor canal block   Pre-Anesthetic Checklist: , timeout performed,  Correct Patient, Correct Site, Correct Laterality,  Correct Procedure, Correct Position, site marked,  Risks and benefits discussed,  Pre-op evaluation,  At surgeon's request and post-op pain management  Laterality: Right  Prep: Maximum Sterile Barrier Precautions used, chloraprep       Needles:  Injection technique: Single-shot  Needle Type: Echogenic Stimulator Needle     Needle Length: 9cm  Needle Gauge: 22     Additional Needles:   Procedures:,,,, ultrasound used (permanent image in chart),,    Narrative:  Start time: 01/15/2023 1:38 PM End time: 01/15/2023 1:40 PM Injection made incrementally with aspirations every 5 mL.  Performed by: Personally  Anesthesiologist: Brennan Bailey, MD  Additional Notes: Risks, benefits, and alternative discussed. Patient gave consent for procedure. Patient prepped and draped in sterile fashion. Relevant anatomy identified with ultrasound guidance. Local anesthetic given in 5cc increments with no signs of intravascular injection. Patient monitored throughout procedure with signs of LAST or immediate complications. Tolerated well. Ultrasound image placed in chart.  Tawny Asal, MD

## 2023-01-15 NOTE — Progress Notes (Signed)
[place] for Heparin Indication: atrial fibrillation  Allergies  Allergen Reactions   Keflex [Cephalexin] Other (See Comments)    "makes pt feel really bad"   Morphine Anxiety and Other (See Comments)    Pt states this made her feel like she was going to die    Patient Measurements: Height: '5\' 4"'$  (162.6 cm) Weight: 86.2 kg (190 lb) IBW/kg (Calculated) : 54.7 Heparin Dosing Weight: 74kg   Vital Signs: Temp: 98.2 F (36.8 C) (02/27 0636) Temp Source: Oral (02/26 2000) BP: 118/89 (02/27 0636)  Labs: Recent Labs    01/13/23 1927 01/13/23 1927 01/14/23 0018 01/14/23 0118 01/14/23 1055 01/14/23 1855 01/15/23 0142  HGB 12.7  --   --  11.5* 11.3*  --  9.7*  HCT 40.3  --   --  36.4 36.6  --  30.9*  PLT 304  --   --  256 248  --  220  APTT  --    < >  --  37* 88* 92* 106*  LABPROT  --   --   --  15.1  --   --   --   INR  --   --   --  1.2  --   --   --   HEPARINUNFRC  --   --  >1.10*  --   --   --  >1.10*  CREATININE 1.10*  --   --   --   --   --  0.93   < > = values in this interval not displayed.     Estimated Creatinine Clearance: 45.3 mL/min (by C-G formula based on SCr of 0.93 mg/dL).   Medical History: Past Medical History:  Diagnosis Date   Arthritis    Atrial fibrillation, transient ([place])    post op hip surgery; May 2021   Diverticulitis    Dyspnea    with exertion    GERD (gastroesophageal reflux disease)    Hyperlipidemia    Hypertension    Stroke ([place])    hx opf mini strokes    TIA (transient ischemic attack)     Medications:  Infusions:   sodium chloride 50 mL/hr at 01/15/23 0056    ceFAZolin (ANCEF) IV     heparin 1,000 Units/hr (01/15/23 0306)   promethazine (PHENERGAN) injection (IM or IVPB) 6.25 mg (01/14/23 2028)    Assessment: 89 yo F on apixaban for Afib - last dose 2/25 @ 0830. She is admited post-fall with RLE fracture requiring surgical repair.  Apixaban is being held in anticipation of  surgery 2/27. Pharmacy consulted to dose IV heparin for bridge. Baseline labs: CBC WNL. Anticipate elevated heparin level due to recent DOAC. Will monitor heparin therapy using aptt levels until aptt/heparin levels correlate.  aPTT slightly high 106. Heparin level >1.1, as expected due to influence of apixaban. Hg down to 9.7, plt stable WNL. No bleeding or infusion issues reported. Surgery scheduled for 1550 2/27.  Goal of Therapy:  Heparin level 0.3-0.7 units/ml aPTT 66-102 seconds Monitor platelets by anticoagulation protocol: Yes   Plan:  Reduce heparin infusion to 900 units/hr Check aPTT in 8 hours Monitor daily aPTT and heparin level until correlating, CBC, s/sx bleeding F/u plan for resumption of apixaban post-op    Thank you for allowing [place] to participate in this patients care. Jens Som, PharmD 01/15/2023 7:08 AM  **Pharmacist phone directory can be found on [email or web address] listed under [place]**

## 2023-01-15 NOTE — Progress Notes (Signed)
Spoke with Dr Marcelino Scot, stop Heparin drip now and pt can proceed to pre-op. Vania Rea RN at [facility] made aware.

## 2023-01-15 NOTE — Consult Note (Signed)
Reason for Consult:Right tib/fib fx Referring Physician: Netta Cedars Time called: [id number] Time at bedside: [name] is an 89 y.o. female.  HPI: [name] was getting into a car yesterday and lost her balance and fell. She had immediate right lower leg pain and could not get up and bear weight. She was brought to the ED where x-rays showed a tib/fib fx and orthopedic surgery was consulted. She lives with her daughter at home and ambulates with a RW.  Past Medical History:  Diagnosis Date   Arthritis    Atrial fibrillation, transient ([name])    post op hip surgery; May 2021   Diverticulitis    Dyspnea    with exertion    GERD (gastroesophageal reflux disease)    Hyperlipidemia    Hypertension    Stroke ([name])    hx opf mini strokes    TIA (transient ischemic attack)     Past Surgical History:  Procedure Laterality Date   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/13/2020   Procedure: CONVERSION TO LEFT TOTAL HIP-POSTERIOR APPROACH;  Surgeon: Paralee Cancel, MD;  Location: [facility];  Service: Orthopedics;  Laterality: Left;  90 mins   HIP FRACTURE SURGERY Left 04/16/2020   KNEE SURGERY     WRIST SURGERY      Family History  Problem Relation Age of Onset   Diabetes Mother     Social History:  reports that she has never smoked. She has never used smokeless tobacco. She reports that she does not drink alcohol and does not use drugs.  Allergies:  Allergies  Allergen Reactions   Keflex [Cephalexin] Other (See Comments)    "makes pt feel really bad"   Morphine Anxiety and Other (See Comments)    Pt states this made her feel like she was going to die    Medications: I have reviewed the patient's current medications.  Results for orders placed or performed during the hospital encounter of 01/13/23 (from the past 48 hour(s))  Basic metabolic panel     Status: Abnormal   Collection Time: 01/13/23  7:27 PM  Result Value Ref Range   Sodium 133 (L) 135 - 145 mmol/L   Potassium 4.6  3.5 - 5.1 mmol/L   Chloride 99 98 - 111 mmol/L   CO2 22 22 - 32 mmol/L   Glucose, Bld 171 (H) 70 - 99 mg/dL    Comment: Glucose reference range applies only to samples taken after fasting for at least 8 hours.   BUN 25 (H) 8 - 23 mg/dL   Creatinine, Ser 1.10 (H) 0.44 - 1.00 mg/dL   Calcium 9.2 8.9 - 10.3 mg/dL   GFR, Estimated 49 (L) >60 mL/min    Comment: (NOTE) Calculated using the CKD-EPI Creatinine Equation (2021)    Anion gap 12 5 - 15    Comment: Performed at [facility], [place] [address], [place], [place] [address]  CBC with Differential     Status: Abnormal   Collection Time: 01/13/23  7:27 PM  Result Value Ref Range   WBC 10.2 4.0 - 10.5 K/uL   RBC 4.53 3.87 - 5.11 MIL/uL   Hemoglobin 12.7 12.0 - 15.0 g/dL   HCT 40.3 36.0 - 46.0 %   MCV 89.0 80.0 - 100.0 fL   MCH 28.0 26.0 - 34.0 pg   MCHC 31.5 30.0 - 36.0 g/dL   RDW 14.3 11.5 - 15.5 %   Platelets 304 150 - 400 K/uL   nRBC 0.0  0.0 - 0.2 %   Neutrophils Relative % 67 %   Neutro Abs 6.9 1.7 - 7.7 K/uL   Lymphocytes Relative 20 %   Lymphs Abs 2.0 0.7 - 4.0 K/uL   Monocytes Relative 8 %   Monocytes Absolute 0.8 0.1 - 1.0 K/uL   Eosinophils Relative 3 %   Eosinophils Absolute 0.3 0.0 - 0.5 K/uL   Basophils Relative 1 %   Basophils Absolute 0.1 0.0 - 0.1 K/uL   Immature Granulocytes 1 %   Abs Immature Granulocytes 0.10 (H) 0.00 - 0.07 K/uL    Comment: Performed at [facility], [place] [address] [place], [place] [address]  Type and screen [place]     Status: None   Collection Time: 01/13/23  7:41 PM  Result Value Ref Range   ABO/RH(D) O POS    Antibody Screen NEG    Sample Expiration      01/16/2023,2359 Performed at [facility], [place] [address], [place], [place] [address]   Heparin level (unfractionated)     Status: Abnormal   Collection Time: 01/14/23 12:18 AM  Result Value Ref Range   Heparin Unfractionated >1.10 (H) 0.30 - 0.70  IU/mL    Comment: (NOTE) The clinical reportable range upper limit is being lowered to >1.10 to align with the FDA approved guidance for the current laboratory assay.  If heparin results are below expected values, and patient dosage has  been confirmed, suggest follow up testing of antithrombin III levels. Performed at [facility], [place] [address] [place], [place] [address]   CBC     Status: Abnormal   Collection Time: 01/14/23  1:18 AM  Result Value Ref Range   WBC 18.6 (H) 4.0 - 10.5 K/uL   RBC 4.08 3.87 - 5.11 MIL/uL   Hemoglobin 11.5 (L) 12.0 - 15.0 g/dL   HCT 36.4 36.0 - 46.0 %   MCV 89.2 80.0 - 100.0 fL   MCH 28.2 26.0 - 34.0 pg   MCHC 31.6 30.0 - 36.0 g/dL   RDW 14.3 11.5 - 15.5 %   Platelets 256 150 - 400 K/uL   nRBC 0.0 0.0 - 0.2 %    Comment: Performed at [facility], [place] [address], [place], [place] [address]  APTT     Status: Abnormal   Collection Time: 01/14/23  1:18 AM  Result Value Ref Range   aPTT 37 (H) 24 - 36 seconds    Comment:        IF BASELINE aPTT IS ELEVATED, SUGGEST PATIENT RISK ASSESSMENT BE USED TO DETERMINE APPROPRIATE ANTICOAGULANT THERAPY. Performed at [facility], [place] [address] [place], [place] [address]   Protime-INR     Status: None   Collection Time: 01/14/23  1:18 AM  Result Value Ref Range   Prothrombin Time 15.1 11.4 - 15.2 seconds   INR 1.2 0.8 - 1.2    Comment: (NOTE) INR goal varies based on device and disease states. Performed at [facility], [place] [address] [place], [place] [address]   APTT     Status: Abnormal   Collection Time: 01/14/23 10:55 AM  Result Value Ref Range   aPTT 88 (H) 24 - 36 seconds    Comment:        IF BASELINE aPTT IS ELEVATED, SUGGEST PATIENT RISK ASSESSMENT BE USED TO DETERMINE APPROPRIATE ANTICOAGULANT THERAPY. Performed at [facility], [place] [address], [place], [place] [address]  CBC      Status: Abnormal   Collection Time: 01/14/23 10:55 AM  Result Value Ref Range   WBC 15.1 (H) 4.0 - 10.5 K/uL   RBC 4.05 3.87 - 5.11 MIL/uL   Hemoglobin 11.3 (L) 12.0 - 15.0 g/dL   HCT 36.6 36.0 - 46.0 %   MCV 90.4 80.0 - 100.0 fL   MCH 27.9 26.0 - 34.0 pg   MCHC 30.9 30.0 - 36.0 g/dL   RDW 14.5 11.5 - 15.5 %   Platelets 248 150 - 400 K/uL   nRBC 0.0 0.0 - 0.2 %    Comment: Performed at [facility], [place] [address], [place], [place] [address]  APTT     Status: Abnormal   Collection Time: 01/14/23  6:55 PM  Result Value Ref Range   aPTT 92 (H) 24 - 36 seconds    Comment:        IF BASELINE aPTT IS ELEVATED, SUGGEST PATIENT RISK ASSESSMENT BE USED TO DETERMINE APPROPRIATE ANTICOAGULANT THERAPY. Performed at [facility], [place] [address], [place], [place] [id number]   Basic metabolic panel     Status: Abnormal   Collection Time: 01/15/23  1:42 AM  Result Value Ref Range   Sodium 132 (L) 135 - 145 mmol/L   Potassium 5.2 (H) 3.5 - 5.1 mmol/L   Chloride 100 98 - 111 mmol/L   CO2 24 22 - 32 mmol/L   Glucose, Bld 113 (H) 70 - 99 mg/dL    Comment: Glucose reference range applies only to samples taken after fasting for at least 8 hours.   BUN 27 (H) 8 - 23 mg/dL   Creatinine, Ser 0.93 0.44 - 1.00 mg/dL   Calcium 8.6 (L) 8.9 - 10.3 mg/dL   GFR, Estimated 59 (L) >60 mL/min    Comment: (NOTE) Calculated using the CKD-EPI Creatinine Equation (2021)    Anion gap 8 5 - 15    Comment: Performed at [place] [address], [place], [place] [address]  APTT     Status: Abnormal   Collection Time: 01/15/23  1:42 AM  Result Value Ref Range   aPTT 106 (H) 24 - 36 seconds    Comment:        IF BASELINE aPTT IS ELEVATED, SUGGEST PATIENT RISK ASSESSMENT BE USED TO DETERMINE APPROPRIATE ANTICOAGULANT THERAPY. Performed at [facility], [place] [address], [place], [place] [address]   Heparin level (unfractionated)     Status: Abnormal   Collection Time:  01/15/23  1:42 AM  Result Value Ref Range   Heparin Unfractionated >1.10 (H) 0.30 - 0.70 IU/mL    Comment: (NOTE) The clinical reportable range upper limit is being lowered to >1.10 to align with the FDA approved guidance for the current laboratory assay.  If heparin results are below expected values, and patient dosage has  been confirmed, suggest follow up testing of antithrombin III levels. Performed at [facility], [place] [address], [place], [place] [address]   CBC     Status: Abnormal   Collection Time: 01/15/23  1:42 AM  Result Value Ref Range   WBC 11.9 (H) 4.0 - 10.5 K/uL   RBC 3.48 (L) 3.87 - 5.11 MIL/uL   Hemoglobin 9.7 (L) 12.0 - 15.0 g/dL   HCT 30.9 (L) 36.0 - 46.0 %   MCV 88.8 80.0 - 100.0 fL   MCH 27.9 26.0 - 34.0 pg   MCHC 31.4 30.0 - 36.0 g/dL  RDW 14.4 11.5 - 15.5 %   Platelets 220 150 - 400 K/uL   nRBC 0.0 0.0 - 0.2 %    Comment: Performed at [facility], [place] [address], [place], [place] [address]    DG Tibia/Fibula Right  Result Date: 01/13/2023 CLINICAL DATA:  Status post fall. EXAM: RIGHT TIBIA AND FIBULA - 2 VIEW COMPARISON:  None Available. FINDINGS: Acute, mildly displaced fracture deformities are seen involving the shafts of the distal right tibia and distal right fibula. An additional nondisplaced fracture of the shaft of the proximal right fibula is seen. There is no evidence of dislocation. Soft tissue swelling is seen along the previously noted fracture sites. IMPRESSION: Acute fractures of the right tibia and right fibula, as described above. Electronically Signed   By: Virgina Norfolk M.D.   On: 01/13/2023 20:42   DG Ankle Right Port  Result Date: 01/13/2023 CLINICAL DATA:  Status post fall. EXAM: PORTABLE RIGHT ANKLE - 2 VIEW COMPARISON:  None Available. FINDINGS: The right ankle was imaged in a fiberglass cast with subsequently obscured osseous detail. Acute, mildly displaced fracture deformities are seen involving the shafts of the  distal right tibia and distal right fibula. There is no evidence of dislocation. Soft tissue swelling is seen along the previously noted fracture sites. IMPRESSION: Acute fracture deformities of the distal right tibia and distal right fibula. Electronically Signed   By: Virgina Norfolk M.D.   On: 01/13/2023 20:39   DG Chest Port 1 View  Result Date: 01/13/2023 CLINICAL DATA:  Status post fall. EXAM: PORTABLE CHEST 1 VIEW COMPARISON:  December 02, 2022 FINDINGS: The heart size and mediastinal contours are within normal limits. The lungs are hyperinflated. Mild, stable airspace disease is seen within the right upper lobe. A subcentimeter calcified lung nodule is seen within the lateral aspect of the mid left lung. There is no evidence of a pleural effusion or pneumothorax. The visualized skeletal structures are unremarkable. IMPRESSION: Mild right upper lobe airspace disease, unchanged in severity when compared to the prior study. Electronically Signed   By: Virgina Norfolk M.D.   On: 01/13/2023 20:38    Review of Systems  HENT:  Negative for ear discharge, ear pain, hearing loss and tinnitus.   Eyes:  Negative for photophobia and pain.  Respiratory:  Negative for cough and shortness of breath.   Cardiovascular:  Negative for chest pain.  Gastrointestinal:  Negative for abdominal pain, nausea and vomiting.  Genitourinary:  Negative for dysuria, flank pain, frequency and urgency.  Musculoskeletal:  Positive for arthralgias (Right lower leg). Negative for back pain, myalgias and neck pain.  Neurological:  Negative for dizziness and headaches.  Hematological:  Does not bruise/bleed easily.  Psychiatric/Behavioral:  The patient is not nervous/anxious.    Blood pressure (!) 121/59, pulse 68, temperature 98.2 F (36.8 C), temperature source Oral, resp. rate 18, height '5\' 4"'$  (1.626 m), weight 86.2 kg, SpO2 99 %. Physical Exam Constitutional:      General: She is not in acute distress.    Appearance:  She is well-developed. She is not diaphoretic.  HENT:     Head: Normocephalic and atraumatic.  Eyes:     General: No scleral icterus.       Right eye: No discharge.        Left eye: No discharge.     Conjunctiva/sclera: Conjunctivae normal.  Cardiovascular:     Rate and Rhythm: Normal rate and regular rhythm.  Pulmonary:     Effort:  Pulmonary effort is normal. No respiratory distress.  Musculoskeletal:     Cervical back: Normal range of motion.     Comments: RLE No traumatic wounds, ecchymosis, or rash  Short leg splint in place  No knee effusion  Knee stable to varus/ valgus and anterior/posterior stress  Sens DPN, SPN, TN intact  Motor EHL 5/5  Toes perfused, No significant edema  Skin:    General: Skin is warm and dry.  Neurological:     Mental Status: She is alert.  Psychiatric:        Mood and Affect: Mood normal.        Behavior: Behavior normal.     Assessment/Plan: Right tib/fib fxs -- Plan IMN today with Dr. Marcelino Scot. Please keep NPO. Multiple medical problems including osteoarthritis, atrial fibrillation on Eliquis, GERD, hypertension, hyperlipidemia, and TIA/CVA -- per primary service    Lisette Abu, PA-C Orthopedic Surgery [phone] 01/15/2023, 12:02 PM

## 2023-01-16 ENCOUNTER — Encounter (HOSPITAL_COMMUNITY): Payer: Self-pay | Admitting: Orthopedic Surgery

## 2023-01-16 DIAGNOSIS — S82401A Unspecified fracture of shaft of right fibula, initial encounter for closed fracture: Secondary | ICD-10-CM | POA: Diagnosis not present

## 2023-01-16 DIAGNOSIS — I480 Paroxysmal atrial fibrillation: Secondary | ICD-10-CM

## 2023-01-16 DIAGNOSIS — S82201A Unspecified fracture of shaft of right tibia, initial encounter for closed fracture: Secondary | ICD-10-CM | POA: Diagnosis not present

## 2023-01-16 LAB — BASIC METABOLIC PANEL
Anion gap: 8 (ref 5–15)
BUN: 17 mg/dL (ref 8–23)
CO2: 23 mmol/L (ref 22–32)
Calcium: 8.8 mg/dL — ABNORMAL LOW (ref 8.9–10.3)
Chloride: 103 mmol/L (ref 98–111)
Creatinine, Ser: 0.9 mg/dL (ref 0.44–1.00)
GFR, Estimated: >60 mL/min (ref >60)
Glucose, Bld: 146 mg/dL — ABNORMAL HIGH (ref 70–99)
Potassium: 4.7 mmol/L (ref 3.5–5.1)
Sodium: 134 mmol/L — ABNORMAL LOW (ref 135–145)

## 2023-01-16 LAB — APTT: aPTT: 64 seconds — ABNORMAL HIGH (ref 24–36)

## 2023-01-16 LAB — HEPARIN LEVEL (UNFRACTIONATED): Heparin Unfractionated: 0.56 IU/mL (ref 0.30–0.70)

## 2023-01-16 MED ORDER — APIXABAN 5 MG PO TABS
5.0000 mg | ORAL_TABLET | Freq: Two times a day (BID) | ORAL | Status: DC
  Administered 2023-01-16 – 2023-01-19 (×7): 5 mg via ORAL
  Filled 2023-01-16 (×7): qty 1

## 2023-01-16 MED ORDER — POLYETHYLENE GLYCOL 3350 17 G PO PACK
17.0000 g | PACK | Freq: Two times a day (BID) | ORAL | Status: DC
  Administered 2023-01-16 – 2023-01-18 (×4): 17 g via ORAL
  Filled 2023-01-16 (×6): qty 1

## 2023-01-16 NOTE — Evaluation (Signed)
Physical Therapy Evaluation Patient Details Name: [name] MRN: [id number] DOB: 09/20/1933 Today's Date: 01/16/2023  History of Present Illness  Pt is an 89 y.o. female presenting to 2/25 with RLE pain and deformity following ground level fall. Found to have distal R tib/fib fracture. Now s/p intramedullary nailing of R tibia 2/27. PMH significant for osteoarthritis, atrial fibrillation on eliquis, GERD, HTN, HLD, TIA/CVA, COPD, chronic diastolic CHF.  Clinical Impression   Pt admitted with above diagnosis. Lives at home with daughter and son-in-law, in a single-level home with one steps to enter; Prior to admission, pt was able to manage independently at home with rollator RW; showers with daughter supervision (shower seat); Presents to PT with functional dependencies, RLE weight bearing restrictions effecting mobility;  Pt currently with functional limitations due to the deficits listed below (see PT Problem List). Pt will benefit from skilled PT to increase their independence and safety with mobility to allow discharge to the venue listed below.          Recommendations for follow up therapy are one component of a multi-disciplinary discharge planning process, led by the attending physician.  Recommendations may be updated based on patient status, additional functional criteria and insurance authorization.  Follow Up Recommendations Skilled nursing-short term rehab (<3 hours/day) Can patient physically be transported by private vehicle: No    Assistance Recommended at Discharge Frequent or constant Supervision/Assistance  Patient can return home with the following  A lot of help with walking and/or transfers;A lot of help with bathing/dressing/bathroom;Assistance with cooking/housework;Help with stairs or ramp for entrance;Assist for transportation    Equipment Recommendations Rolling walker (2 wheels);Wheelchair (measurements PT);Wheelchair cushion (measurements PT)  Recommendations  for Other Services       Functional Status Assessment Patient has had a recent decline in their functional status and demonstrates the ability to make significant improvements in function in a reasonable and predictable amount of time.     Precautions / Restrictions Precautions Precautions: Fall Restrictions Weight Bearing Restrictions: Yes RLE Weight Bearing: Non weight bearing      Mobility  Bed Mobility Overal bed mobility: Needs Assistance Bed Mobility: Supine to Sit     Supine to sit: Mod assist, HOB elevated     General bed mobility comments: Assist for RLE toward L EOB, and light assist for elevating trunk. VC for problem solving and new learning while maintaining precautions.    Transfers Overall transfer level: Needs assistance Equipment used: Rolling walker (2 wheels), None Transfers: Sit to/from Stand, Bed to chair/wheelchair/BSC Sit to Stand: Mod assist, +2 physical assistance, +2 safety/equipment     Squat pivot transfers: Mod assist, +2 physical assistance, +2 safety/equipment     General transfer comment: Mod A for rise and pivot toward L from bed to chair. Light assist during squat pivot to maintain NWB precautions. Of note, nerve block still active during session, so lack of sensation may have affected ability to maintain precautions. Pt also required heavy mod A up from chair to RW. Greater difficulty maintaining precautions with full STS, requiring therapists to assist. Decreased knowledge of preferred hand placement as well.    Ambulation/Gait                  Stairs            Wheelchair Mobility    Modified Rankin (Stroke Patients Only)       Balance Overall balance assessment: Needs assistance Sitting-balance support: Feet supported Sitting balance-Leahy Scale: Fair  Standing balance support: Bilateral upper extremity supported Standing balance-Leahy Scale: Poor                               Pertinent  Vitals/Pain Pain Assessment Pain Assessment: Faces Faces Pain Scale: Hurts a little bit Pain Location: discomfort (Nerve block still in effect) Pain Descriptors / Indicators: Operative site guarding, Numbness    Home Living Family/patient expects to be discharged to:: Private residence Living Arrangements: Children Available Help at Discharge: Family;Available PRN/intermittently Type of Home: House Home Access: Stairs to enter   Entrance Stairs-Number of Steps: 1   Home Layout: Two level;Able to live on main level with bedroom/bathroom Home Equipment: Shower seat;BSC/3in1;Standard Walker;Grab bars - tub/shower (has two BSCs; has wheelchair, unsure if transport chair or regular manual wheelchair.)      Prior Function Prior Level of Function : Independent/Modified Independent;Driving             Mobility Comments: uses rollator ADLs Comments: receives help in/out of shower from daughter     Hand Dominance   Dominant Hand: Right    Extremity/Trunk Assessment   Upper Extremity Assessment Upper Extremity Assessment: Overall WFL for tasks assessed    Lower Extremity Assessment Lower Extremity Assessment: RLE deficits/detail RLE Deficits / Details: Nerve block still effective; Good quad set and short arc quad; limited sensation due to nerve block    Cervical / Trunk Assessment Cervical / Trunk Assessment: Kyphotic  Communication   Communication: HOH (mildly)  Cognition Arousal/Alertness: Awake/alert Behavior During Therapy: WFL for tasks assessed/performed Overall Cognitive Status: Within Functional Limits for tasks assessed                                 General Comments: following all commands WFL during session        General Comments General comments (skin integrity, edema, etc.): Portion of session conducted on Room Air, O2 sats decr to 87%, restarted supplemental O2 and sats 94% at end of session    Exercises     Assessment/Plan    PT  Assessment Patient needs continued PT services  PT Problem List Decreased strength;Decreased range of motion;Decreased activity tolerance;Decreased balance;Decreased mobility;Decreased knowledge of use of DME;Decreased knowledge of precautions;Cardiopulmonary status limiting activity;Pain       PT Treatment Interventions DME instruction;Gait training;Functional mobility training;Therapeutic activities;Therapeutic exercise;Balance training;Neuromuscular re-education;Patient/family education;Wheelchair mobility training    PT Goals (Current goals can be found in the Care Plan section)  Acute Rehab PT Goals Patient Stated Goal: Did not specifically state; agreeable to getting OOB PT Goal Formulation: With patient Time For Goal Achievement: 01/30/23 Potential to Achieve Goals: Good    Frequency Min 3X/week     Co-evaluation PT/OT/SLP Co-Evaluation/Treatment: Yes Reason for Co-Treatment: For patient/therapist safety;To address functional/ADL transfers;Other (comment) (anticipating activity tolerance) PT goals addressed during session: Mobility/safety with mobility         AM-PAC PT "6 Clicks" Mobility  Outcome Measure Help needed turning from your back to your side while in a flat bed without using bedrails?: A Little Help needed moving from lying on your back to sitting on the side of a flat bed without using bedrails?: A Lot Help needed moving to and from a bed to a chair (including a wheelchair)?: A Lot Help needed standing up from a chair using your arms (e.g., wheelchair or bedside chair)?: A Lot Help needed to walk  in hospital room?: Total Help needed climbing 3-5 steps with a railing? : Total 6 Click Score: 11    End of Session Equipment Utilized During Treatment: Gait belt;Oxygen Activity Tolerance: Patient tolerated treatment well Patient left: in chair;with call bell/phone within reach;with chair alarm set;with family/visitor present Nurse Communication: Mobility  status PT Visit Diagnosis: Unsteadiness on feet (R26.81);Other abnormalities of gait and mobility (R26.89);Pain Pain - Right/Left: Right Pain - part of body: Leg    Time: [id number] PT Time Calculation (min) (ACUTE ONLY): 47 min   Charges:   PT Evaluation $PT Eval Moderate Complexity: 1 Mod PT Treatments $Therapeutic Activity: 8-22 mins        Roney Marion, PT  Acute Rehabilitation Services Office [phone]   [place] 01/16/2023, 11:40 AM

## 2023-01-16 NOTE — NC FL2 (Signed)
[place] LEVEL OF CARE FORM     IDENTIFICATION  Patient Name: [name] Birthdate: [birth year] Sex: female Admission Date (Current Location): 01/13/2023  [facility] and [place] Number:  Herbalist and Address:  [facility] [place], [place] [address] [place], [place] [address]      Provider Number: [id number]  Attending Physician Name and Address:  Caren Griffins, MD  Relative Name and Phone Number:  cheek, pat Daughter   [phone]    Current Level of Care: Hospital Recommended Level of Care: [place] Prior Approval Number:    Date Approved/Denied:   PASRR Number: [id number]  Discharge Plan: SNF    Current Diagnoses: Patient Active Problem List   Diagnosis Date Noted   Preop cardiovascular exam 01/15/2023   Preoperative cardiovascular examination 01/14/2023   AKI (acute kidney injury) ([place]) 01/14/2023   Leukocytosis 01/14/2023   GERD (gastroesophageal reflux disease) 01/14/2023   Closed fracture of right fibula and tibia 01/13/2023   Atrial fibrillation with RVR ([place]) 11/27/2022   Acute respiratory failure with hypoxia ([place]) 11/24/2022   Influenza B 11/24/2022   Right upper lobe pneumonia 11/24/2022   Hyponatremia 11/24/2022   COPD with acute exacerbation ([place]) 11/24/2022   Essential hypertension 11/24/2022   Hyperlipidemia 11/24/2022   Pneumonia due to COVID-19 virus 09/26/2022   Overweight (BMI 25.0-29.9) 09/14/2020   S/P left THA, 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   Paroxysmal atrial fibrillation ([place]) 07/21/2020    Orientation RESPIRATION BLADDER Height & Weight     Self, Time, Situation, Place  O2 Incontinent Weight: 190 lb (86.2 kg) Height:  '5\' 4"'$  (162.6 cm)  BEHAVIORAL SYMPTOMS/MOOD NEUROLOGICAL BOWEL NUTRITION STATUS      Continent Diet (see discharge summary)  AMBULATORY STATUS COMMUNICATION OF NEEDS Skin   Total Care Verbally Surgical wounds                        Personal Care Assistance Level of Assistance  Bathing, Feeding, Dressing Bathing Assistance: Limited assistance Feeding assistance: Independent Dressing Assistance: Maximum assistance     Functional Limitations Info  Sight, Hearing, Speech Sight Info: Adequate Hearing Info: Adequate Speech Info: Adequate    SPECIAL CARE FACTORS FREQUENCY  PT (By licensed PT), OT (By licensed OT)     PT Frequency: 5x week OT Frequency: 5x week            Contractures Contractures Info: Not present    Additional Factors Info  Code Status, Allergies Code Status Info: full Allergies Info: Keflex (Cephalexin), Morphine           Current Medications (01/16/2023):  This is the current hospital active medication list Current Facility-Administered Medications  Medication Dose Route Frequency Provider Last Rate Last Admin   0.9 %  sodium chloride infusion   Intravenous Continuous Ainsley Spinner, PA-C 50 mL/hr at 01/16/23 0939 New Bag at 01/16/23 0939   amiodarone (PACERONE) tablet 200 mg  200 mg Oral Daily Ainsley Spinner, PA-C   200 mg at 01/16/23 [id number]   apixaban (ELIQUIS) tablet 5 mg  5 mg Oral BID Caren Griffins, MD   5 mg at 01/16/23 1132   ascorbic acid (VITAMIN C) tablet 1,000 mg  1,000 mg Oral Daily Ainsley Spinner, PA-C   1,000 mg at 01/16/23 [id number]   ceFAZolin (ANCEF) IVPB 2g/100 mL premix  2 g Intravenous To OR Ainsley Spinner, PA-C       ceFAZolin (  ANCEF) IVPB 2g/100 mL premix  2 g Intravenous Q8H Ainsley Spinner, PA-C 200 mL/hr at 01/16/23 [id number] 2 g at 01/16/23 [id number]   chlorhexidine (PERIDEX) 0.12 % solution 15 mL  15 mL Mouth/Throat NOW Ainsley Spinner, PA-C       cholecalciferol (VITAMIN D3) 25 MCG (1000 UNIT) tablet 5,000 Units  5,000 Units Oral Daily Ainsley Spinner, PA-C   5,000 Units at 01/16/23 [id number]   cyanocobalamin (VITAMIN B12) tablet 1,000 mcg  1,000 mcg Oral Audree Camel, PA-C   1,000 mcg at 01/16/23 [id number]   diltiazem (CARDIZEM CD) 24 hr capsule 240 mg  240 mg Oral Daily Ainsley Spinner,  PA-C   240 mg at 01/16/23 [id number]   docusate sodium (COLACE) capsule 100 mg  100 mg Oral BID Ainsley Spinner, PA-C   100 mg at 01/16/23 [id number]   feeding supplement (ENSURE ENLIVE / ENSURE PLUS) liquid 237 mL  237 mL Oral BID BM Little Ishikawa, MD       ferrous sulfate tablet 325 mg  325 mg Oral BID WC Ainsley Spinner, PA-C   325 mg at 01/16/23 [id number]   gabapentin (NEURONTIN) capsule 300 mg  300 mg Oral Daily Ainsley Spinner, PA-C   300 mg at 01/16/23 [id number]   And   gabapentin (NEURONTIN) capsule 600 mg  600 mg Oral QHS Ainsley Spinner, PA-C   600 mg at 01/15/23 2116   HYDROcodone-acetaminophen (NORCO/VICODIN) 5-325 MG per tablet 1-2 tablet  1-2 tablet Oral Q6H PRN Ainsley Spinner, PA-C   2 tablet at 01/14/23 1841   HYDROmorphone (DILAUDID) injection 0.5 mg  0.5 mg Intravenous Q2H PRN Ainsley Spinner, PA-C   0.5 mg at 01/15/23 [id number]   ipratropium-albuterol (DUONEB) 0.5-2.5 (3) MG/3ML nebulizer solution 3 mL  3 mL Nebulization Q6H PRN Ainsley Spinner, PA-C   3 mL at 01/14/23 0159   labetalol (NORMODYNE) tablet 200 mg  200 mg Oral BID Ainsley Spinner, PA-C   200 mg at 01/16/23 [id number]   lactated ringers infusion   Intravenous Continuous Ainsley Spinner, PA-C       loratadine (CLARITIN) tablet 10 mg  10 mg Oral Daily Ainsley Spinner, PA-C   10 mg at 01/16/23 [id number]   methocarbamol (ROBAXIN) tablet 750 mg  750 mg Oral TID PRN Ainsley Spinner, PA-C   750 mg at 01/14/23 1107   mirtazapine (REMERON) tablet 15 mg  15 mg Oral QHS Ainsley Spinner, PA-C   15 mg at 01/15/23 2115   multivitamin (PROSIGHT) tablet 2 tablet  2 tablet Oral BID WC Ainsley Spinner, PA-C   2 tablet at 01/16/23 0840   multivitamin with minerals tablet 1 tablet  1 tablet Oral Daily Ainsley Spinner, PA-C   1 tablet at 01/16/23 0838   ondansetron (ZOFRAN) injection 4 mg  4 mg Intravenous Q6H PRN Ainsley Spinner, PA-C   4 mg at 01/14/23 0800   pantoprazole (PROTONIX) EC tablet 40 mg  40 mg Oral Daily Ainsley Spinner, PA-C   40 mg at 01/16/23 [id number]   pravastatin (PRAVACHOL) tablet 20 mg  20 mg Oral QHS Ainsley Spinner, PA-C   20 mg at 01/15/23 2115   promethazine (PHENERGAN) 6.25 mg in sodium chloride 0.9 % 50 mL IVPB  6.25 mg Intravenous Q6H PRN Ainsley Spinner, PA-C   Stopped at 01/15/23 1006   spironolactone (ALDACTONE) tablet 25 mg  25 mg Oral Daily Ainsley Spinner, PA-C   25 mg at 01/16/23 0839   sucralfate (CARAFATE) tablet 1 g  1 g Oral TID with meals  Ainsley Spinner, PA-C   1 g at 01/16/23 1132     Discharge Medications: Please see discharge summary for a list of discharge medications.  Relevant Imaging Results:  Relevant Lab Results:   Additional Information SS# [id number], pt is vaccinated for covid but not boosted.  Joanne Chars, LCSW

## 2023-01-16 NOTE — Progress Notes (Signed)
Rounding Note    Patient Name: [name] Date of Encounter: 01/16/2023   [facility] Cardiologist: Katelyn Mayo, MD   Subjective   Pt comfortable in chair  No SOB or CP   Inpatient Medications    Scheduled Meds:  amiodarone  200 mg Oral Daily   apixaban  5 mg Oral BID   vitamin C  1,000 mg Oral Daily   cholecalciferol  5,000 Units Oral Daily   cyanocobalamin  1,000 mcg Oral QODAY   diltiazem  240 mg Oral Daily   docusate sodium  100 mg Oral BID   feeding supplement  237 mL Oral BID BM   ferrous sulfate  325 mg Oral BID WC   gabapentin  300 mg Oral Daily   And   gabapentin  600 mg Oral QHS   labetalol  200 mg Oral BID   loratadine  10 mg Oral Daily   mirtazapine  15 mg Oral QHS   multivitamin  2 tablet Oral BID WC   multivitamin with minerals  1 tablet Oral Daily   pantoprazole  40 mg Oral Daily   pravastatin  20 mg Oral QHS   spironolactone  25 mg Oral Daily   sucralfate  1 g Oral TID with meals   Continuous Infusions:  sodium chloride 50 mL/hr at 01/16/23 0939    ceFAZolin (ANCEF) IV      ceFAZolin (ANCEF) IV 2 g (01/16/23 0609)   lactated ringers     promethazine (PHENERGAN) injection (IM or IVPB) Stopped (01/15/23 1006)   PRN Meds: HYDROcodone-acetaminophen, HYDROmorphone (DILAUDID) injection, ipratropium-albuterol, methocarbamol, ondansetron (ZOFRAN) IV, promethazine (PHENERGAN) injection (IM or IVPB)   Vital Signs    Vitals:   01/15/23 2127 01/16/23 0008 01/16/23 0834 01/16/23 0953  BP: 99/78 (!) 123/56 (!) 142/65 (!) 131/58  Pulse:  64 64 65  Resp:  '17 20 18  '$ Temp:  98 F (36.7 C)  97.6 F (36.4 C)  TempSrc:  Oral  Oral  SpO2:  98% 99% 94%  Weight:      Height:        Intake/Output Summary (Last 24 hours) at 01/16/2023 1359 Last data filed at 01/16/2023 0418 Gross per 24 hour  Intake 1602.62 ml  Output 950 ml  Net 652.62 ml      01/15/2023   12:47 PM 01/13/2023    8:07 PM 12/18/2022    3:58 PM  Last 3 Weights  Weight  (lbs) 190 lb 190 lb 188 lb 3.2 oz  Weight (kg) 86.183 kg 86.183 kg 85.367 kg      Telemetry    SR  - Personally Reviewed  ECG    No new today  - Personally Reviewed  Physical Exam   GEN: No acute distress.   Neck: No JVD Cardiac: RRR, no murmur Respiratory: Clear to auscultation bilaterally. GI: Soft, nontender, non-distended  MS: No edema;   Labs    High Sensitivity Troponin:  No results for input(s): "TROPONINIHS" in the last 720 hours.   Chemistry Recent Labs  Lab 01/13/23 1927 01/15/23 0142 01/16/23 0430  NA 133* 132* 134*  K 4.6 5.2* 4.7  CL 99 100 103  CO2 '22 24 23  '$ GLUCOSE 171* 113* 146*  BUN 25* 27* 17  CREATININE 1.10* 0.93 0.90  CALCIUM 9.2 8.6* 8.8*  GFRNONAA 49* 59* >60  ANIONGAP '12 8 8    '$ Lipids No results for input(s): "CHOL", "TRIG", "HDL", "LABVLDL", "LDLCALC", "CHOLHDL" in the last 168 hours.  Hematology Recent Labs  Lab 01/14/23 0118 01/14/23 1055 01/15/23 0142  WBC 18.6* 15.1* 11.9*  RBC 4.08 4.05 3.48*  HGB 11.5* 11.3* 9.7*  HCT 36.4 36.6 30.9*  MCV 89.2 90.4 88.8  MCH 28.2 27.9 27.9  MCHC 31.6 30.9 31.4  RDW 14.3 14.5 14.4  PLT 256 248 220   Thyroid No results for input(s): "TSH", "FREET4" in the last 168 hours.  BNPNo results for input(s): "BNP", "PROBNP" in the last 168 hours.  DDimer No results for input(s): "DDIMER" in the last 168 hours.   Radiology    DG Tibia/Fibula Right Port  Result Date: 01/15/2023 CLINICAL DATA:  [id number] Fracture 96051 EXAM: PORTABLE RIGHT TIBIA AND FIBULA - 2 VIEW COMPARISON:  01/13/2023 FINDINGS: Postsurgical changes of intramedullary nailing of the tibia for a distal tibia fracture. Improved fracture alignment. Intact hardware. Expected right knee joint effusion. Tricompartment osteoarthritis of the knee. Improved alignment of the proximal and distal fibula fractures. Splint is in place. IMPRESSION: Intramedullary nail fixation of the tibia without evidence of immediate complication. Improved distal  tibia fracture alignment. Improved proximal and distal fibula fracture alignment. Electronically Signed   By: [name] M.D.   On: 01/15/2023 16:37   DG Tibia/Fibula Right  Result Date: 01/15/2023 CLINICAL DATA:  Intramedullary nail fixation EXAM: RIGHT TIBIA AND FIBULA - 2 VIEW; DG C-ARM 1-60 MIN-NO REPORT COMPARISON:  01/13/2023 FLUOROSCOPY: Air kerma 5.21 mGy FINDINGS: Intraoperative fluoroscopic images of the right tibia and fibula demonstrate intramedullary nail fixation of the tibia about distal fractures. IMPRESSION: Intraoperative fluoroscopic images of the right tibia and fibula demonstrate intramedullary nail fixation of the tibia about distal fractures. Electronically Signed   By: [name] M.D.   On: 01/15/2023 15:22   DG C-Arm 1-60 Min-No Report  Result Date: 01/15/2023 Fluoroscopy was utilized by the requesting physician.  No radiographic interpretation.   DG C-Arm 1-60 Min-No Report  Result Date: 01/15/2023 Fluoroscopy was utilized by the requesting physician.  No radiographic interpretation.    Cardiac Studies     Patient Profile      [name] is a 89 y.o. female with a history of  paroxysmal atrial fibrillation on Eliquis, chronic diastolic CHF, hypertension, hyperlipidemia, TIAs, COPD, GERD, and diverticulitis  who is being seen 01/14/2023 for pre-op evaluation at the request of Dr. Maylene [name].    Assessment & Plan    Pt is post op Day 1     1  PAF  maintaining SR   Continue home meds (amio/dilt/labetalol)    Eliquis started   Keep on tele  2 Hx HFpEF   Volume status is OK     3  HTN  BP is under OK control     Continue meds   Will be available as needed   Please call   Will sign off    For questions or updates, please contact [place] Please consult [email or web address] for contact info under        Signed, Katelyn Carnes, MD  01/16/2023, 1:59 PM

## 2023-01-16 NOTE — TOC Initial Note (Addendum)
Transition of Care [facility]) - Initial/Assessment Note    Patient Details  Name: [name] MRN: [id number] Date of Birth: August 17, 1933  [facility] CM/SW Contact:    Joanne Chars, LCSW Phone Number: 01/16/2023, 12:25 PM  Clinical Narrative:    CSW met with pt and daughter Fraser Din to discuss DC recommendation for SNF.  THey are agreeable, medicare choice document provided, they are requesting [facility].  Pt lives with daughter and son in law, [facility] PT services have been sporadically coming, Pat cannot recall name of [facility] agency.  Pt is vaccinated for covid but not boosted.  Referral sent out in hub for SNF, reached out to [facility] to review.                Inpatient order 01/13/23.    Expected Discharge Plan: Skilled Nursing Facility Barriers to Discharge: Continued Medical Work up, SNF Pending bed offer   Patient Goals and CMS Choice Patient states their goals for this hospitalization and ongoing recovery are:: "walking good" CMS Medicare.gov Compare Post Acute Care list provided to:: Patient Represenative (must comment) (daughter Fraser Din) Choice offered to / list presented to : Adult Children      Expected Discharge Plan and Services In-house Referral: Clinical Social Work   Post Acute Care Choice: [place] Living arrangements for the past 2 months: [place]                                      Prior Living Arrangements/Services Living arrangements for the past 2 months: Single Family Home Lives with:: Adult Children (daughter and son in [place]) Patient language and need for interpreter reviewed:: Yes        Need for Family Participation in Patient Care: Yes (Comment) Care giver support system in place?: Yes (comment) Current home services: Home PT (HH-can't recall which agency) Criminal Activity/Legal Involvement Pertinent to Current Situation/Hospitalization: No - Comment as needed  Activities of Daily Living       Permission Sought/Granted                  Emotional Assessment Appearance:: Appears stated age Attitude/Demeanor/Rapport: Engaged Affect (typically observed): Appropriate, Pleasant Orientation: : Oriented to Self, Oriented to Place, Oriented to  Time, Oriented to Situation      Admission diagnosis:  Fibula fracture [S82.409A] Closed fracture of right tibia and fibula, initial encounter [S82.201A, S82.401A] Patient Active Problem List   Diagnosis Date Noted   Preop cardiovascular exam 01/15/2023   Preoperative cardiovascular examination 01/14/2023   AKI (acute kidney injury) ([place]) 01/14/2023   Leukocytosis 01/14/2023   GERD (gastroesophageal reflux disease) 01/14/2023   Closed fracture of right fibula and tibia 01/13/2023   Atrial fibrillation with RVR ([place]) 11/27/2022   Acute respiratory failure with hypoxia ([place]) 11/24/2022   Influenza B 11/24/2022   Right upper lobe pneumonia 11/24/2022   Hyponatremia 11/24/2022   COPD with acute exacerbation ([place]) 11/24/2022   Essential hypertension 11/24/2022   Hyperlipidemia 11/24/2022   Pneumonia due to COVID-19 virus 09/26/2022   Overweight (BMI 25.0-29.9) 09/14/2020   S/P left THA, 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   Paroxysmal atrial fibrillation ([place]) 07/21/2020   PCP:  Myrlene Broker, MD Pharmacy:   [id number] [id number] [id number]: [phone]:  [phone]     Social Determinants of Health (SDOH) Social History: SDOH Screenings   Food Insecurity: No Food Insecurity (11/25/2022)  Housing: Low Risk  (11/25/2022)  Transportation Needs: No Transportation Needs (11/25/2022)  Utilities: Not At Risk (11/25/2022)  Tobacco Use: Low Risk  (01/15/2023)   SDOH Interventions:     Readmission Risk Interventions     No data to display

## 2023-01-16 NOTE — Care Management Important Message (Signed)
Important Message  Patient Details  Name: [name] MRN: [id number] Date of Birth: [birth year]   Medicare Important Message Given:  Yes     Hannah Beat 01/16/2023, 11:24 AM

## 2023-01-16 NOTE — Plan of Care (Signed)
Pt had a good rest overnight, confusion 1x but able to reorient. Heparin drip started 10m/hr. Regional block still in effect to right leg.   Problem: Education: Goal: Knowledge of General Education information will improve Description: Including pain rating scale, medication(s)/side effects and non-pharmacologic comfort measures Outcome: Progressing   Problem: Health Behavior/Discharge Planning: Goal: Ability to manage health-related needs will improve Outcome: Progressing   Problem: Clinical Measurements: Goal: Ability to maintain clinical measurements within normal limits will improve Outcome: Progressing

## 2023-01-16 NOTE — Evaluation (Signed)
Occupational Therapy Evaluation Patient Details Name: [name] MRN: [id number] DOB: 12/24/1932 Today's Date: 01/16/2023   History of Present Illness Pt is an 89 y.o. female presenting to 2/25 with RLE pain and deformity following ground level fall. Found to have distal R tib/fib fracture. Now s/p intramedullary nailing of R tibia 2/27. PMH significant for osteoarthritis, atrial fibrillation on eliquis, GERD, HTN, HLD, TIA/CVA, COPD, chronic diastolic CHF.   Clinical Impression   PTA, pt lived with daughter and son-in-law who assisted with shower transfers. Pt reports she has become more sedentary over the past several months, but was still performing her own BADL and driving to appointments, etc. Upon eval, pt requires max A +2 for LB ADL due to difficulty maintaining NWB precautions with mobility. Pt requires mod+2 for squat pivot transfers and mod-max A +2 for STS transfers. Pt with good awareness of precautions overall. Presenting with decreased strength, balance, safety, and activity tolerance. Recommending continued OT services at SNF to optimize safety and independence in ADL and IADL.      Recommendations for follow up therapy are one component of a multi-disciplinary discharge planning process, led by the attending physician.  Recommendations may be updated based on patient status, additional functional criteria and insurance authorization.   Follow Up Recommendations  Skilled nursing-short term rehab (<3 hours/day)     Assistance Recommended at Discharge Frequent or constant Supervision/Assistance  Patient can return home with the following Two people to help with walking and/or transfers;Two people to help with bathing/dressing/bathroom;Assistance with cooking/housework;Assist for transportation;Help with stairs or ramp for entrance    Functional Status Assessment  Patient has had a recent decline in their functional status and demonstrates the ability to make significant  improvements in function in a reasonable and predictable amount of time.  Equipment Recommendations  Other (comment) (defer)    Recommendations for Other Services       Precautions / Restrictions Precautions Precautions: Fall Restrictions Weight Bearing Restrictions: Yes RLE Weight Bearing: Non weight bearing      Mobility Bed Mobility Overal bed mobility: Needs Assistance Bed Mobility: Supine to Sit     Supine to sit: Mod assist, HOB elevated     General bed mobility comments: Assist for RLE toward L EOB, and light assist for elevating trunk. VC for problem solving and new learning while maintaining precautions.    Transfers Overall transfer level: Needs assistance Equipment used: Rolling walker (2 wheels), None Transfers: Sit to/from Stand, Bed to chair/wheelchair/BSC Sit to Stand: Mod assist, +2 physical assistance, +2 safety/equipment   Squat pivot transfers: Mod assist, +2 physical assistance, +2 safety/equipment       General transfer comment: Mod A for rise and pivot toward L from bed to chair. Light assist during squat pivot to maintain NWB precautions. Of note, nerve block still active during session, so lack of sensation may have affected ability to maintain precautions. Pt also required heavy mod A up from chair to RW. Greater difficulty maintaining precautions with full STS, requiring therapists to assist. Decreased knowledge of preferred hand placement as well.      Balance Overall balance assessment: Needs assistance Sitting-balance support: No upper extremity supported, Feet supported Sitting balance-Leahy Scale: Good Sitting balance - Comments: statically.   Standing balance support: Bilateral upper extremity supported, During functional activity Standing balance-Leahy Scale: Zero Standing balance comment: mod+ to maintain static standing with RW  ADL either performed or assessed with clinical judgement   ADL  Overall ADL's : Needs assistance/impaired Eating/Feeding: Independent;Sitting   Grooming: Set up;Sitting   Upper Body Bathing: Set up;Sitting   Lower Body Bathing: Moderate assistance;+2 for physical assistance;+2 for safety/equipment;Sit to/from stand   Upper Body Dressing : Set up;Sitting   Lower Body Dressing: Maximal assistance;Sit to/from stand Lower Body Dressing Details (indicate cue type and reason): Unable to achieve figure 4 this session which is how she typically dons socks Toilet Transfer: Moderate assistance;+2 for safety/equipment;+2 for physical assistance;Rolling walker (2 wheels);Squat-pivot Armed forces technical officer Details (indicate cue type and reason): Assist for rise, sequence, and maintenance of NWB precautions Toileting- Clothing Manipulation and Hygiene: Supervision/safety;Sitting/lateral lean       Functional mobility during ADLs: Moderate assistance;+2 for physical assistance;+2 for safety/equipment (squat pivot)       Vision Baseline Vision/History: 1 Wears glasses Ability to See in Adequate Light: 0 Adequate Patient Visual Report: No change from baseline Vision Assessment?: No apparent visual deficits Additional Comments: WFL for tasks assessed     Perception Perception Perception Tested?: No   Praxis      Pertinent Vitals/Pain Pain Assessment Pain Assessment: Faces Faces Pain Scale: Hurts a little bit Pain Location: discomfort Pain Descriptors / Indicators: Operative site guarding, Numbness Pain Intervention(s): Limited activity within patient's tolerance, Monitored during session, Repositioned     Hand Dominance Right   Extremity/Trunk Assessment Upper Extremity Assessment Upper Extremity Assessment: Overall WFL for tasks assessed   Lower Extremity Assessment Lower Extremity Assessment: RLE deficits/detail RLE Deficits / Details: Nerve block still effective; Good quad set and short arc quad; limited sensation due to nerve block   Cervical /  Trunk Assessment Cervical / Trunk Assessment: Kyphotic   Communication Communication Communication: HOH (mildly)   Cognition Arousal/Alertness: Awake/alert Behavior During Therapy: WFL for tasks assessed/performed Overall Cognitive Status: Within Functional Limits for tasks assessed                                 General Comments: following all commands WFL during session     General Comments  Portion of session conducted on Room Air, O2 sats decr to 87%, restarted supplemental O2 and sats 94% at end of session    Exercises     Shoulder Instructions      Home Living Family/patient expects to be discharged to:: Private residence Living Arrangements: Children Available Help at Discharge: Family;Available PRN/intermittently Type of Home: House Home Access: Stairs to enter Entrance Stairs-Number of Steps: 1   Home Layout: Two level;Able to live on main level with bedroom/bathroom     Bathroom Shower/Tub: Walk-in shower (~6 in lip in)   Bathroom Toilet:  (BSC over toilet) Bathroom Accessibility:  (would have to go in sideways)   Home Equipment: Shower seat;BSC/3in1;Standard Walker;Grab bars - tub/shower (has two BSCs; has wheelchair, unsure if transport chair or regular manual wheelchair.)          Prior Functioning/Environment Prior Level of Function : Independent/Modified Independent;Driving             Mobility Comments: uses rollator ADLs Comments: receives help in/out of shower from daughter        OT Problem List: Decreased strength;Decreased activity tolerance;Impaired balance (sitting and/or standing);Decreased range of motion;Decreased knowledge of use of DME or AE;Decreased knowledge of precautions      OT Treatment/Interventions:      OT Goals(Current goals can be found in  the care plan section) Acute Rehab OT Goals Patient Stated Goal: move better OT Goal Formulation: With patient/family Time For Goal Achievement:  01/30/23 Potential to Achieve Goals: Good ADL Goals Pt Will Perform Lower Body Dressing: with min assist;sitting/lateral leans Pt Will Transfer to Toilet: with min assist;squat pivot transfer;bedside commode Additional ADL Goal #1: Pt will perform bed mobility with mod I as a precursor to ADL  OT Frequency:      Co-evaluation   Reason for Co-Treatment: For patient/therapist safety;To address functional/ADL transfers;Other (comment) (anticipating activity tolerance) PT goals addressed during session: Mobility/safety with mobility        AM-PAC OT "6 Clicks" Daily Activity     Outcome Measure Help from another person eating meals?: None Help from another person taking care of personal grooming?: A Little Help from another person toileting, which includes using toliet, bedpan, or urinal?: A Lot Help from another person bathing (including washing, rinsing, drying)?: A Lot Help from another person to put on and taking off regular upper body clothing?: A Little Help from another person to put on and taking off regular lower body clothing?: A Lot 6 Click Score: 16   End of Session Equipment Utilized During Treatment: Gait belt;Rolling walker (2 wheels) Nurse Communication: Mobility status;Precautions;Weight bearing status;Other (comment) (transfer +2 toward L)  Activity Tolerance: Patient tolerated treatment well Patient left: in chair;with call bell/phone within reach;with family/visitor present  OT Visit Diagnosis: Unsteadiness on feet (R26.81);Muscle weakness (generalized) (M62.81);Other abnormalities of gait and mobility (R26.89)                Time: [id number] OT Time Calculation (min): 50 min Charges:  OT General Charges $OT Visit: 1 Visit OT Evaluation $OT Eval Low Complexity: 1 Low  Elder Cyphers, OTR/L [facility] Acute Rehabilitation Office: [phone]   Magnus Ivan 01/16/2023, 11:36 AM

## 2023-01-16 NOTE — Progress Notes (Signed)
Orthopaedic Trauma Service Progress Note  Patient ID: [name] MRN: [id number] DOB/AGE: February 11, 1933 89 y.o.  Subjective:  Doing ok  Block R leg still working  Uses rollator at baseline   No other complaints  ROS As above  Objective:   VITALS:   Vitals:   01/15/23 1928 01/15/23 2127 01/16/23 0008 01/16/23 0834  BP: (!) 125/96 99/78 (!) 123/56 (!) 142/65  Pulse: 70  64 64  Resp: '20  17 20  '$ Temp: 98 F (36.7 C)  98 F (36.7 C)   TempSrc: Oral  Oral   SpO2: 97%  98% 99%  Weight:      Height:        Estimated body mass index is 32.61 kg/m as calculated from the following:   Height as of this encounter: '5\' 4"'$  (1.626 m).   Weight as of this encounter: 86.2 kg.   Intake/Output      02/27 0701 02/28 0700 02/28 0701 02/29 0700   P.O. 0    I.V. (mL/kg) 1451.7 (16.8)    IV Piggyback 200.9    Total Intake(mL/kg) 1652.6 (19.2)    Urine (mL/kg/hr) 1700 (0.8)    Emesis/NG output     Blood 50    Total Output 1750    Net -97.4           LABS  Results for orders placed or performed during the hospital encounter of 01/13/23 (from the past 24 hour(s))  Heparin level (unfractionated)     Status: None   Collection Time: 01/16/23  4:30 AM  Result Value Ref Range   Heparin Unfractionated 0.56 0.30 - 0.70 IU/mL  APTT     Status: Abnormal   Collection Time: 01/16/23  4:30 AM  Result Value Ref Range   aPTT 64 (H) 24 - 36 seconds  Basic metabolic panel     Status: Abnormal   Collection Time: 01/16/23  4:30 AM  Result Value Ref Range   Sodium 134 (L) 135 - 145 mmol/L   Potassium 4.7 3.5 - 5.1 mmol/L   Chloride 103 98 - 111 mmol/L   CO2 23 22 - 32 mmol/L   Glucose, Bld 146 (H) 70 - 99 mg/dL   BUN 17 8 - 23 mg/dL   Creatinine, Ser 0.90 0.44 - 1.00 mg/dL   Calcium 8.8 (L) 8.9 - 10.3 mg/dL   GFR, Estimated >60 >60 mL/min   Anion gap 8 5 - 15     PHYSICAL EXAM:   Gen: resting comfortably  in bed, NAD, family at bedside  Lungs: unlabored Ext:       Right Lower Extremity   SLS fitting well  Minimal swelling to toes  No motor or sensory function noted due to block   Ext warm   Good perfusion distally    Assessment/Plan: 1 Day Post-Op    Anti-infectives (From admission, onward)    Start     Dose/Rate Route Frequency Ordered Stop   01/15/23 2200  ceFAZolin (ANCEF) IVPB 2g/100 mL premix        2 g 200 mL/hr over 30 Minutes Intravenous Every 8 hours 01/15/23 1929 01/16/23 2159   01/15/23 1400  ceFAZolin (ANCEF) IVPB 2g/100 mL premix        2 g 200 mL/hr over 30 Minutes Intravenous To Surgery 01/14/23 1456 01/16/23  1400     .  POD/HD#: 1  89 y/o female s/p fall with closed R distal tibia and fibula fracture, R proximal fibula fracture   - R distal tibia and fibula fracture, R proximal fibula fracture s/p IMN and splinting Weightbearing NWB R leg x  6 weeks   Doubt she will be able to hop on L leg so she will likely be stand, pivot transfers    ROM/Activity   Unrestricted knee and hip ROM    No ankle ROM as he is splinted   Wound care   Splint to remain on x 2 weeks   Pt with extensive fracture blisters and several skin tears   PT/OT   Ice and elevate for swelling and pain control   - Pain management:  Multimodal   - ABL anemia/Hemodynamics  Stable   Monitor   - Medical issues   Ok to resume home anticoagulation   Per medicine   - DVT/PE prophylaxis:  Resume home eliquis - ID:   Periop abx  - Metabolic Bone Disease:  Clinically poor bone quality   Mechanism suggestive of osteoporosis   Check vitamin d   - Activity:  As above   NWB R LEx      WBAT L leg and UEx    - Impediments to fracture healing:  Poor bone quality    - Dispo:  Ortho issues stable  Therapy evals   CIR? Vs SNF   Jari Pigg, PA-C [phone] Loletha Grayer) 01/16/2023, 9:22 AM  [facility] [address] [place] [address] [phone]  Jenetta [phone] (F)    After 5pm and on the weekends please log on to Amion, go to orthopaedics and the look under the [facility] Call for the provider(s) on call. You can also call our office at [phone] and then follow the prompts to be connected to the call team.  Patient ID: [name], female   DOB: July 21, 1933, 89 y.o.   MRN: [id number]

## 2023-01-16 NOTE — Progress Notes (Signed)
PROGRESS NOTE  [name] [id number] DOB: 07/11/1933 DOA: 01/13/2023 PCP: Myrlene Broker, MD   LOS: 3 days   Brief Narrative / Interim history: [name] is a 89 y.o. female with a known history of osteoarthritis, atrial fibrillation on Eliquis, GERD, hypertension, hyperlipidemia, TIA/CVA presents to the emergency department for evaluation of right lower extremity pain status post fall.  Patient was in a usual state of health until this evening she was getting into a car when she fell landing on her right side.  Patient lives at home with her daughter and is functionally independent with the use of a walker.  She has been getting home physical therapy. In the ED, she was found to have proximal right fibula fracture and distal right tib fib fracture.  Hospitalist called for admission, orthopedic surgery consulted.     Subjective / 24h Interval events: She is doing well this morning.  Denies significant pain.  Assesement and Plan: Principal Problem:   Closed fracture of right fibula and tibia Active Problems:   Paroxysmal atrial fibrillation (HCC)   Essential hypertension   Hyperlipidemia   Preoperative cardiovascular examination   AKI (acute kidney injury) (HCC)   Leukocytosis   GERD (gastroesophageal reflux disease)   Preop cardiovascular exam   Principal problem Right proximal fibula, right distal tib-fib fracture after fall -orthopedic surgery consulted, she was taken to the OR on 2/27 and is status post ORIF.  Resume Eliquis when okay with orthopedic surgery postoperatively, currently on heparin drip  Active problems AKI, resolved -Baseline creatinine 0.7, presented with creatinine 1.1   Leukocytosis, likely reactive -Downtrending appropriately no signs or symptoms of infection, hold antibiotics.   Paroxysmal A-fib -Home medications include amiodarone, Cardizem, labetalol. Hold Eliquis. On IV heparin. -Cardiology consulted for preop evaluation   Chronic  diastolic heart failure -Without exacerbation currently. Continue spironolactone   Hypertension -Cardizem, labetalol, spironolactone   Hyperlipidemia -Pravastatin   GERD -Protonix, Carafate  Scheduled Meds:  amiodarone  200 mg Oral Daily   vitamin C  1,000 mg Oral Daily   chlorhexidine  15 mL Mouth/Throat NOW   cholecalciferol  5,000 Units Oral Daily   cyanocobalamin  1,000 mcg Oral QODAY   diltiazem  240 mg Oral Daily   docusate sodium  100 mg Oral BID   feeding supplement  237 mL Oral BID BM   ferrous sulfate  325 mg Oral BID WC   gabapentin  300 mg Oral Daily   And   gabapentin  600 mg Oral QHS   labetalol  200 mg Oral BID   loratadine  10 mg Oral Daily   mirtazapine  15 mg Oral QHS   multivitamin  2 tablet Oral BID WC   multivitamin with minerals  1 tablet Oral Daily   pantoprazole  40 mg Oral Daily   pravastatin  20 mg Oral QHS   spironolactone  25 mg Oral Daily   sucralfate  1 g Oral TID with meals   Continuous Infusions:  sodium chloride Stopped (01/15/23 1226)    ceFAZolin (ANCEF) IV      ceFAZolin (ANCEF) IV 2 g (01/16/23 0609)   heparin 900 Units/hr (01/16/23 0346)   lactated ringers     promethazine (PHENERGAN) injection (IM or IVPB) Stopped (01/15/23 1006)   PRN Meds:.HYDROcodone-acetaminophen, HYDROmorphone (DILAUDID) injection, ipratropium-albuterol, methocarbamol, ondansetron (ZOFRAN) IV, promethazine (PHENERGAN) injection (IM or IVPB)  Current Outpatient Medications  Medication Instructions   acetaminophen (TYLENOL) 1,000 mg, Oral, Every 8 hours   albuterol (VENTOLIN HFA)  108 (90 Base) MCG/ACT inhaler 2 puffs, Inhalation, Every 4 hours PRN   amiodarone (PACERONE) 200 mg, Oral, Daily   cetirizine (ZYRTEC) 10 mg, Oral, Daily   cyanocobalamin 1000 MCG tablet 1 tablet, Oral, Every other day, Vitamin B12   diltiazem (CARDIZEM CD) 240 mg, Oral, Daily   Eliquis 5 mg, Oral, 2 times daily   ferrous sulfate (FERROUSUL) 325 mg, Oral, 3 times daily with meals    gabapentin (NEURONTIN) 300 mg, Oral, See admin instructions, Take one capsule (300 mg) in the morning and two capsules (600 mg) in the evening.   ipratropium-albuterol (DUONEB) 0.5-2.5 (3) MG/3ML SOLN 3 mLs, Nebulization, Every 6 hours PRN   labetalol (NORMODYNE) 200 mg, Oral, 2 times daily   Metamucil Fiber CHEW 1 tablet, Oral, Daily, After supper   methocarbamol (ROBAXIN) 750 mg, Oral, 3 times daily PRN, 1 tablet in am and 2 tablets in pm   mirtazapine (REMERON) 15 mg, Oral, Daily at bedtime   Multiple Vitamin (MULTIVITAMIN WITH MINERALS) TABS tablet 1 tablet, Oral, Daily   Multiple Vitamins-Minerals (PRESERVISION AREDS 2 PO) 1 tablet, Oral, 2 times daily, Vision Shield   Omega-3 Fatty Acids (OMEGA-3 FISH OIL PO) 2 capsules, Oral, Daily   pantoprazole (PROTONIX) 40 mg, Oral, Daily   pravastatin (PRAVACHOL) 20 mg, Oral, Daily at bedtime   spironolactone (ALDACTONE) 25 mg, Oral, Daily   sucralfate (CARAFATE) 1 g, Oral, 3 times daily with meals   vitamin C 1,000 mg, Oral, Daily   Vitamin D3 5,000 Units, Oral, Daily    Diet Orders (From admission, onward)     Start     Ordered   01/15/23 1804  Diet Heart Room service appropriate? Yes; Fluid consistency: Thin  Diet effective now       Question Answer Comment  Room service appropriate? Yes   Fluid consistency: Thin      01/15/23 1803            DVT prophylaxis: SCDs Start: 01/14/23 0028   Lab Results  Component Value Date   PLT 220 01/15/2023      Code Status: Full Code  Family Communication: No family at bedside  Status is: Inpatient  Remains inpatient appropriate because: postop day #1  Level of care: Telemetry Surgical  Consultants:  Orthopedic surgery   Objective: Vitals:   01/15/23 1928 01/15/23 2127 01/16/23 0008 01/16/23 0834  BP: (!) 125/96 99/78 (!) 123/56 (!) 142/65  Pulse: 70  64 64  Resp: '20  17 20  '$ Temp: 98 F (36.7 C)  98 F (36.7 C)   TempSrc: Oral  Oral   SpO2: 97%  98% 99%  Weight:       Height:        Intake/Output Summary (Last 24 hours) at 01/16/2023 0928 Last data filed at 01/16/2023 0418 Gross per 24 hour  Intake 1652.62 ml  Output 950 ml  Net 702.62 ml   Wt Readings from Last 3 Encounters:  01/15/23 86.2 kg  12/18/22 85.5 kg  12/18/22 85.4 kg    Examination:  Constitutional: NAD Eyes: no scleral icterus ENMT: Mucous membranes are moist.  Neck: normal, supple Respiratory: clear to auscultation bilaterally, no wheezing, no crackles. Normal respiratory effort. No accessory muscle use.  Cardiovascular: Regular rate and rhythm, no murmurs / rubs / gallops. No LE edema.  Abdomen: non distended, no tenderness. Bowel sounds positive.  Musculoskeletal: no clubbing / cyanosis.    Data Reviewed: I have independently reviewed following labs and imaging studies  CBC Recent Labs  Lab 01/13/23 1927 01/14/23 0118 01/14/23 1055 01/15/23 0142  WBC 10.2 18.6* 15.1* 11.9*  HGB 12.7 11.5* 11.3* 9.7*  HCT 40.3 36.4 36.6 30.9*  PLT 304 256 248 220  MCV 89.0 89.2 90.4 88.8  MCH 28.0 28.2 27.9 27.9  MCHC 31.5 31.6 30.9 31.4  RDW 14.3 14.3 14.5 14.4  LYMPHSABS 2.0  --   --   --   MONOABS 0.8  --   --   --   EOSABS 0.3  --   --   --   BASOSABS 0.1  --   --   --     Recent Labs  Lab 01/13/23 1927 01/14/23 0118 01/15/23 0142 01/16/23 0430  NA 133*  --  132* 134*  K 4.6  --  5.2* 4.7  CL 99  --  100 103  CO2 22  --  24 23  GLUCOSE 171*  --  113* 146*  BUN 25*  --  27* 17  CREATININE 1.10*  --  0.93 0.90  CALCIUM 9.2  --  8.6* 8.8*  INR  --  1.2  --   --     ------------------------------------------------------------------------------------------------------------------ No results for input(s): "CHOL", "HDL", "LDLCALC", "TRIG", "CHOLHDL", "LDLDIRECT" in the last 72 hours.  Lab Results  Component Value Date   HGBA1C 5.8 (H) 09/13/2020    ------------------------------------------------------------------------------------------------------------------ No results for input(s): "TSH", "T4TOTAL", "T3FREE", "THYROIDAB" in the last 72 hours.  Invalid input(s): "FREET3"  Cardiac Enzymes No results for input(s): "CKMB", "TROPONINI", "MYOGLOBIN" in the last 168 hours.  Invalid input(s): "CK" ------------------------------------------------------------------------------------------------------------------    Component Value Date/Time   BNP 390.2 (H) 11/24/2022 2117    CBG: No results for input(s): "GLUCAP" in the last 168 hours.  No results found for this or any previous visit (from the past 240 hour(s)).   Radiology Studies: DG Tibia/Fibula Right Port  Result Date: 01/15/2023 CLINICAL DATA:  [id number] Fracture 96051 EXAM: PORTABLE RIGHT TIBIA AND FIBULA - 2 VIEW COMPARISON:  01/13/2023 FINDINGS: Postsurgical changes of intramedullary nailing of the tibia for a distal tibia fracture. Improved fracture alignment. Intact hardware. Expected right knee joint effusion. Tricompartment osteoarthritis of the knee. Improved alignment of the proximal and distal fibula fractures. Splint is in place. IMPRESSION: Intramedullary nail fixation of the tibia without evidence of immediate complication. Improved distal tibia fracture alignment. Improved proximal and distal fibula fracture alignment. Electronically Signed   By: Maurine Simmering M.D.   On: 01/15/2023 16:37   DG Tibia/Fibula Right  Result Date: 01/15/2023 CLINICAL DATA:  Intramedullary nail fixation EXAM: RIGHT TIBIA AND FIBULA - 2 VIEW; DG C-ARM 1-60 MIN-NO REPORT COMPARISON:  01/13/2023 FLUOROSCOPY: Air kerma 5.21 mGy FINDINGS: Intraoperative fluoroscopic images of the right tibia and fibula demonstrate intramedullary nail fixation of the tibia about distal fractures. IMPRESSION: Intraoperative fluoroscopic images of the right tibia and fibula demonstrate intramedullary nail fixation of the  tibia about distal fractures. Electronically Signed   By: Delanna Ahmadi M.D.   On: 01/15/2023 15:22   DG C-Arm 1-60 Min-No Report  Result Date: 01/15/2023 Fluoroscopy was utilized by the requesting physician.  No radiographic interpretation.   DG C-Arm 1-60 Min-No Report  Result Date: 01/15/2023 Fluoroscopy was utilized by the requesting physician.  No radiographic interpretation.     Marzetta Board, MD, PhD [facility]  Between 7 am - 7 pm I am available, please contact me via Amion (for emergencies) or Securechat (non urgent messages)  Between 7 pm - 7 am I  am not available, please contact night coverage MD/APP via Amion

## 2023-01-16 NOTE — Progress Notes (Addendum)
ANTICOAGULATION CONSULT NOTE  Pharmacy Consult for Heparin Indication: atrial fibrillation  Allergies  Allergen Reactions   Keflex [Cephalexin] Other (See Comments)    "makes pt feel really bad"  Tolerated Cephalosporin Date: 01/15/23.     Morphine Anxiety and Other (See Comments)    Pt states this made her feel like she was going to die    Patient Measurements: Height: '5\' 4"'$  (162.6 cm) Weight: 86.2 kg (190 lb) IBW/kg (Calculated) : 54.7 Heparin Dosing Weight: 74kg   Vital Signs: Temp: 98 F (36.7 C) (02/28 0008) Temp Source: Oral (02/28 0008) BP: 123/56 (02/28 0008) Pulse Rate: 64 (02/28 0008)  Labs: Recent Labs    01/13/23 1927 01/13/23 1927 01/14/23 0018 01/14/23 0118 01/14/23 1055 01/14/23 1855 01/15/23 0142 01/16/23 0430  HGB 12.7  --   --  11.5* 11.3*  --  9.7*  --   HCT 40.3  --   --  36.4 36.6  --  30.9*  --   PLT 304  --   --  256 248  --  220  --   APTT  --    < >  --  37* 88* 92* 106* 64*  LABPROT  --   --   --  15.1  --   --   --   --   INR  --   --   --  1.2  --   --   --   --   HEPARINUNFRC  --   --  >1.10*  --   --   --  >1.10* 0.56  CREATININE 1.10*  --   --   --   --   --  0.93  --    < > = values in this interval not displayed.     Estimated Creatinine Clearance: 45.3 mL/min (by C-G formula based on SCr of 0.93 mg/dL).   Medical History: Past Medical History:  Diagnosis Date   Arthritis    Atrial fibrillation, transient ([place])    post op hip surgery; May 2021   Diverticulitis    Dyspnea    with exertion    GERD (gastroesophageal reflux disease)    Hyperlipidemia    Hypertension    Stroke ([place])    hx opf mini strokes    TIA (transient ischemic attack)     Medications:  Infusions:   sodium chloride Stopped (01/15/23 1226)    ceFAZolin (ANCEF) IV      ceFAZolin (ANCEF) IV 2 g (01/16/23 0609)   heparin 900 Units/hr (01/16/23 0346)   lactated ringers     promethazine (PHENERGAN) injection (IM or IVPB) Stopped (01/15/23 1006)     Assessment: 89 yo F on apixaban for Afib - last dose 2/25 @ 0830. She is admited post-fall with RLE fracture requiring surgical repair.  Apixaban is being held in anticipation of surgery 2/27. Pharmacy consulted to dose IV heparin for bridge.   aPTT slightly subtherapeutic at 64 seconds and heparin level is therapeutic at 0.56. Levels are likely now correlating, will dose drip off heparin levels only.   Goal of Therapy:  Heparin level 0.3-0.7 units/ml aPTT 66-102 seconds Monitor platelets by anticoagulation protocol: Yes   Plan:  Continue heparin drip at 900 units/hr Daily heparin level F/u ability to resume Eliquis  Dimple Nanas, PharmD, BCPS 01/16/2023 7:32 AM  AM UPDATE: - Per ortho ok to resume home Eliquis - Stop IV heparin - Resume Eliquis '5mg'$  BID (dose appropriate for weight and Scr)  Dimple Nanas,  PharmD, BCPS 01/16/2023 10:30 AM

## 2023-01-17 DIAGNOSIS — I480 Paroxysmal atrial fibrillation: Secondary | ICD-10-CM | POA: Diagnosis not present

## 2023-01-17 LAB — CBC
HCT: 27.1 % — ABNORMAL LOW (ref 36.0–46.0)
Hemoglobin: 8.9 g/dL — ABNORMAL LOW (ref 12.0–15.0)
MCH: 28.6 pg (ref 26.0–34.0)
MCHC: 32.8 g/dL (ref 30.0–36.0)
MCV: 87.1 fL (ref 80.0–100.0)
Platelets: 202 10*3/uL (ref 150–400)
RBC: 3.11 MIL/uL — ABNORMAL LOW (ref 3.87–5.11)
RDW: 14.4 % (ref 11.5–15.5)
WBC: 10.8 10*3/uL — ABNORMAL HIGH (ref 4.0–10.5)
nRBC: 0 % (ref 0.0–0.2)

## 2023-01-17 LAB — COMPREHENSIVE METABOLIC PANEL WITH GFR
ALT: 9 U/L (ref 0–44)
AST: 12 U/L — ABNORMAL LOW (ref 15–41)
Albumin: 2.8 g/dL — ABNORMAL LOW (ref 3.5–5.0)
Alkaline Phosphatase: 52 U/L (ref 38–126)
Anion gap: 7 (ref 5–15)
BUN: 19 mg/dL (ref 8–23)
CO2: 27 mmol/L (ref 22–32)
Calcium: 9.3 mg/dL (ref 8.9–10.3)
Chloride: 103 mmol/L (ref 98–111)
Creatinine, Ser: 0.85 mg/dL (ref 0.44–1.00)
GFR, Estimated: >60 mL/min
Glucose, Bld: 117 mg/dL — ABNORMAL HIGH (ref 70–99)
Potassium: 4.6 mmol/L (ref 3.5–5.1)
Sodium: 137 mmol/L (ref 135–145)
Total Bilirubin: 0.6 mg/dL (ref 0.3–1.2)
Total Protein: 5.2 g/dL — ABNORMAL LOW (ref 6.5–8.1)

## 2023-01-17 LAB — VITAMIN D 25 HYDROXY (VIT D DEFICIENCY, FRACTURES): Vit D, 25-Hydroxy: 46.43 ng/mL (ref 30–100)

## 2023-01-17 LAB — MAGNESIUM: Magnesium: 1.9 mg/dL (ref 1.7–2.4)

## 2023-01-17 NOTE — TOC Progression Note (Signed)
Transition of Care [facility]) - Progression Note    Patient Details  Name: [name] MRN: [id number] Date of Birth: 12/04/1932  Transition of [facility]) CM/SW Contact  Joanne Chars, LCSW Phone Number: 01/17/2023, 10:14 AM  Clinical Narrative:   [facility] does offer bed, but semi private room. Discussed with pt daughter Fraser Din and her husband, they would like private room option.  Discussed other bed offers, they requested response from [facility].  [facility] does have private room, Fraser Din informed and does accept this offer. MD informed, plan for DC tomorrow.  Confirmed with [facility].    Expected Discharge Plan: [place] Barriers to Discharge: Continued Medical Work up, SNF Pending bed offer  Expected Discharge Plan and Services In-house Referral: Clinical Social Work   Post Acute Care Choice: [place] Living arrangements for the past 2 months: Single Family Home                                       Social Determinants of Health (SDOH) Interventions SDOH Screenings   Food Insecurity: No Food Insecurity (11/25/2022)  Housing: Low Risk  (11/25/2022)  Transportation Needs: No Transportation Needs (11/25/2022)  Utilities: Not At Risk (11/25/2022)  Tobacco Use: Low Risk  (01/16/2023)    Readmission Risk Interventions     No data to display

## 2023-01-17 NOTE — Plan of Care (Signed)
Pt c/o 10/10 pain in the right knee this morning. Able to wiggle toes a little bit but still no sensation.   Problem: Education: Goal: Knowledge of General Education information will improve Description: Including pain rating scale, medication(s)/side effects and non-pharmacologic comfort measures Outcome: Progressing   Problem: Health Behavior/Discharge Planning: Goal: Ability to manage health-related needs will improve Outcome: Progressing   Problem: Clinical Measurements: Goal: Ability to maintain clinical measurements within normal limits will improve Outcome: Progressing   Problem: Activity: Goal: Risk for activity intolerance will decrease Outcome: Progressing   Problem: Pain Managment: Goal: General experience of comfort will improve Outcome: Progressing

## 2023-01-17 NOTE — Progress Notes (Signed)
PROGRESS NOTE  [name] [id number] DOB: 1933-04-01 DOA: 01/13/2023 PCP: Myrlene Broker, MD   LOS: 4 days   Brief Narrative / Interim history: [name] is a 89 y.o. female with a known history of osteoarthritis, atrial fibrillation on Eliquis, GERD, hypertension, hyperlipidemia, TIA/CVA presents to the emergency department for evaluation of right lower extremity pain status post fall.  Patient was in a usual state of health until this evening she was getting into a car when she fell landing on her right side.  Patient lives at home with her daughter and is functionally independent with the use of a walker.  She has been getting home physical therapy. In the ED, she was found to have proximal right fibula fracture and distal right tib fib fracture.  Hospitalist called for admission, orthopedic surgery consulted.     Subjective / 24h Interval events: Doing well today, daughter is at bedside.  Intermittently having pain, but daughter reports that patient is not straightforward in reporting pain  Assesement and Plan: Principal Problem:   Closed fracture of right fibula and tibia Active Problems:   PAF (paroxysmal atrial fibrillation) (HCC)   Essential hypertension   Hyperlipidemia   Preoperative cardiovascular examination   AKI (acute kidney injury) (HCC)   Leukocytosis   GERD (gastroesophageal reflux disease)   Preop cardiovascular exam   Principal problem Right proximal fibula, right distal tib-fib fracture after fall -orthopedic surgery consulted, she was taken to the OR on 2/27 and is status post ORIF.  Doing well postoperatively, PT recommends SNF, placement pending.  She is back on her Eliquis for anticoagulation.  Active problems AKI, resolved -Baseline creatinine 0.7, presented with creatinine 1.1, now improving and return to normal   Leukocytosis, likely reactive -Downtrending appropriately no signs or symptoms of infection, hold antibiotics.   Paroxysmal  A-fib -Home medications include amiodarone, Cardizem, labetalol.  Back on Eliquis now. -Cardiology consulted for preop evaluation   Chronic diastolic heart failure -Without exacerbation currently. Continue spironolactone  Acute blood loss anemia-postoperatively, hemoglobin 8.9, no need for blood transfusions.  Repeat CBC in the morning  Hypertension -Cardizem, labetalol, spironolactone   Hyperlipidemia -Pravastatin   GERD -Protonix, Carafate  Scheduled Meds:  amiodarone  200 mg Oral Daily   apixaban  5 mg Oral BID   vitamin C  1,000 mg Oral Daily   cholecalciferol  5,000 Units Oral Daily   cyanocobalamin  1,000 mcg Oral QODAY   diltiazem  240 mg Oral Daily   docusate sodium  100 mg Oral BID   feeding supplement  237 mL Oral BID BM   ferrous sulfate  325 mg Oral BID WC   gabapentin  300 mg Oral Daily   And   gabapentin  600 mg Oral QHS   labetalol  200 mg Oral BID   loratadine  10 mg Oral Daily   mirtazapine  15 mg Oral QHS   multivitamin  2 tablet Oral BID WC   multivitamin with minerals  1 tablet Oral Daily   pantoprazole  40 mg Oral Daily   polyethylene glycol  17 g Oral BID   pravastatin  20 mg Oral QHS   spironolactone  25 mg Oral Daily   sucralfate  1 g Oral TID with meals   Continuous Infusions:  sodium chloride 50 mL/hr at 01/16/23 2003   lactated ringers     promethazine (PHENERGAN) injection (IM or IVPB) Stopped (01/15/23 1006)   PRN Meds:.HYDROcodone-acetaminophen, HYDROmorphone (DILAUDID) injection, ipratropium-albuterol, methocarbamol, ondansetron (ZOFRAN) IV, promethazine (  PHENERGAN) injection (IM or IVPB)  Current Outpatient Medications  Medication Instructions   acetaminophen (TYLENOL) 1,000 mg, Oral, Every 8 hours   albuterol (VENTOLIN HFA) 108 (90 Base) MCG/ACT inhaler 2 puffs, Inhalation, Every 4 hours PRN   amiodarone (PACERONE) 200 mg, Oral, Daily   cetirizine (ZYRTEC) 10 mg, Oral, Daily   cyanocobalamin 1000 MCG tablet 1 tablet, Oral, Every other  day, Vitamin B12   diltiazem (CARDIZEM CD) 240 mg, Oral, Daily   Eliquis 5 mg, Oral, 2 times daily   ferrous sulfate (FERROUSUL) 325 mg, Oral, 3 times daily with meals   gabapentin (NEURONTIN) 300 mg, Oral, See admin instructions, Take one capsule (300 mg) in the morning and two capsules (600 mg) in the evening.   ipratropium-albuterol (DUONEB) 0.5-2.5 (3) MG/3ML SOLN 3 mLs, Nebulization, Every 6 hours PRN   labetalol (NORMODYNE) 200 mg, Oral, 2 times daily   Metamucil Fiber CHEW 1 tablet, Oral, Daily, After supper   methocarbamol (ROBAXIN) 750 mg, Oral, 3 times daily PRN, 1 tablet in am and 2 tablets in pm   mirtazapine (REMERON) 15 mg, Oral, Daily at bedtime   Multiple Vitamin (MULTIVITAMIN WITH MINERALS) TABS tablet 1 tablet, Oral, Daily   Multiple Vitamins-Minerals (PRESERVISION AREDS 2 PO) 1 tablet, Oral, 2 times daily, Vision Shield   Omega-3 Fatty Acids (OMEGA-3 FISH OIL PO) 2 capsules, Oral, Daily   pantoprazole (PROTONIX) 40 mg, Oral, Daily   pravastatin (PRAVACHOL) 20 mg, Oral, Daily at bedtime   spironolactone (ALDACTONE) 25 mg, Oral, Daily   sucralfate (CARAFATE) 1 g, Oral, 3 times daily with meals   vitamin C 1,000 mg, Oral, Daily   Vitamin D3 5,000 Units, Oral, Daily    Diet Orders (From admission, onward)     Start     Ordered   01/17/23 0729  Diet regular Room service appropriate? Yes; Fluid consistency: Thin  Diet effective now       Question Answer Comment  Room service appropriate? Yes   Fluid consistency: Thin      01/17/23 0729            DVT prophylaxis: SCDs Start: 01/14/23 0028 apixaban (ELIQUIS) tablet 5 mg   Lab Results  Component Value Date   PLT 202 01/17/2023      Code Status: Full Code  Family Communication: No family at bedside  Status is: Inpatient  Remains inpatient appropriate because: postop day #1  Level of care: Telemetry Surgical  Consultants:  Orthopedic surgery   Objective: Vitals:   01/16/23 1455 01/16/23 1941  01/17/23 0545 01/17/23 0821  BP: (!) 119/52 (!) 120/50 132/61 (!) 157/58  Pulse: 67 70 65 62  Resp: '20 17 16 18  '$ Temp: 98 F (36.7 C) 98.5 F (36.9 C) 97.9 F (36.6 C) 98 F (36.7 C)  TempSrc: Oral  Oral   SpO2: 95% 96% 93% 98%  Weight:      Height:        Intake/Output Summary (Last 24 hours) at 01/17/2023 1108 Last data filed at 01/16/2023 2052 Gross per 24 hour  Intake 690.9 ml  Output 950 ml  Net -259.1 ml    Wt Readings from Last 3 Encounters:  01/15/23 86.2 kg  12/18/22 85.5 kg  12/18/22 85.4 kg    Examination:  Constitutional: NAD Eyes: lids and conjunctivae normal, no scleral icterus ENMT: mmm Neck: normal, supple Respiratory: clear to auscultation bilaterally, no wheezing, no crackles. Normal respiratory effort.  Cardiovascular: Regular rate and rhythm, no murmurs / rubs /  gallops. No LE edema. Abdomen: soft, no distention, no tenderness. Bowel sounds positive.  Skin: no rashes  Data Reviewed: I have independently reviewed following labs and imaging studies   CBC Recent Labs  Lab 01/13/23 1927 01/14/23 0118 01/14/23 1055 01/15/23 0142 01/17/23 0309  WBC 10.2 18.6* 15.1* 11.9* 10.8*  HGB 12.7 11.5* 11.3* 9.7* 8.9*  HCT 40.3 36.4 36.6 30.9* 27.1*  PLT 304 256 248 220 202  MCV 89.0 89.2 90.4 88.8 87.1  MCH 28.0 28.2 27.9 27.9 28.6  MCHC 31.5 31.6 30.9 31.4 32.8  RDW 14.3 14.3 14.5 14.4 14.4  LYMPHSABS 2.0  --   --   --   --   MONOABS 0.8  --   --   --   --   EOSABS 0.3  --   --   --   --   BASOSABS 0.1  --   --   --   --      Recent Labs  Lab 01/13/23 1927 01/14/23 0118 01/15/23 0142 01/16/23 0430 01/17/23 0309  NA 133*  --  132* 134* 137  K 4.6  --  5.2* 4.7 4.6  CL 99  --  100 103 103  CO2 22  --  '24 23 27  '$ GLUCOSE 171*  --  113* 146* 117*  BUN 25*  --  27* 17 19  CREATININE 1.10*  --  0.93 0.90 0.85  CALCIUM 9.2  --  8.6* 8.8* 9.3  AST  --   --   --   --  12*  ALT  --   --   --   --  9  ALKPHOS  --   --   --   --  52  BILITOT   --   --   --   --  0.6  ALBUMIN  --   --   --   --  2.8*  MG  --   --   --   --  1.9  INR  --  1.2  --   --   --      ------------------------------------------------------------------------------------------------------------------ No results for input(s): "CHOL", "HDL", "LDLCALC", "TRIG", "CHOLHDL", "LDLDIRECT" in the last 72 hours.  Lab Results  Component Value Date   HGBA1C 5.8 (H) 09/13/2020   ------------------------------------------------------------------------------------------------------------------ No results for input(s): "TSH", "T4TOTAL", "T3FREE", "THYROIDAB" in the last 72 hours.  Invalid input(s): "FREET3"  Cardiac Enzymes No results for input(s): "CKMB", "TROPONINI", "MYOGLOBIN" in the last 168 hours.  Invalid input(s): "CK" ------------------------------------------------------------------------------------------------------------------    Component Value Date/Time   BNP 390.2 (H) 11/24/2022 2117    CBG: No results for input(s): "GLUCAP" in the last 168 hours.  No results found for this or any previous visit (from the past 240 hour(s)).   Radiology Studies: No results found.   Marzetta Board, MD, PhD [facility]  Between 7 am - 7 pm I am available, please contact me via Amion (for emergencies) or Securechat (non urgent messages)  Between 7 pm - 7 am I am not available, please contact night coverage MD/APP via Amion

## 2023-01-17 NOTE — Progress Notes (Signed)
Orthopaedic Trauma Service Progress Note  Patient ID: [name] MRN: [id number] DOB/AGE: 1933-02-26 89 y.o.  Subjective:  Ortho issues stable No complaints  ROS As above  Objective:   VITALS:   Vitals:   01/16/23 1941 01/17/23 0545 01/17/23 0821 01/17/23 1228  BP: (!) 120/50 132/61 (!) 157/58 (!) 130/56  Pulse: 70 65 62 66  Resp: '17 16 18 18  '$ Temp: 98.5 F (36.9 C) 97.9 F (36.6 C) 98 F (36.7 C) 98 F (36.7 C)  TempSrc:  Oral    SpO2: 96% 93% 98% 97%  Weight:      Height:        Estimated body mass index is 32.61 kg/m as calculated from the following:   Height as of this encounter: '5\' 4"'$  (1.626 m).   Weight as of this encounter: 86.2 kg.   Intake/Output      02/28 0701 02/29 0700 02/29 0701 03/01 0700   P.O.     I.V. (mL/kg) 490.9 (5.7)    IV Piggyback 200    Total Intake(mL/kg) 690.9 (8)    Urine (mL/kg/hr) 950 (0.5)    Blood     Total Output 950    Net -259.1           LABS  Results for orders placed or performed during the hospital encounter of 01/13/23 (from the past 24 hour(s))  CBC     Status: Abnormal   Collection Time: 01/17/23  3:09 AM  Result Value Ref Range   WBC 10.8 (H) 4.0 - 10.5 K/uL   RBC 3.11 (L) 3.87 - 5.11 MIL/uL   Hemoglobin 8.9 (L) 12.0 - 15.0 g/dL   HCT 27.1 (L) 36.0 - 46.0 %   MCV 87.1 80.0 - 100.0 fL   MCH 28.6 26.0 - 34.0 pg   MCHC 32.8 30.0 - 36.0 g/dL   RDW 14.4 11.5 - 15.5 %   Platelets 202 150 - 400 K/uL   nRBC 0.0 0.0 - 0.2 %  VITAMIN D 25 Hydroxy (Vit-D Deficiency, Fractures)     Status: None   Collection Time: 01/17/23  3:09 AM  Result Value Ref Range   Vit D, 25-Hydroxy 46.43 30 - 100 ng/mL  Comprehensive metabolic panel     Status: Abnormal   Collection Time: 01/17/23  3:09 AM  Result Value Ref Range   Sodium 137 135 - 145 mmol/L   Potassium 4.6 3.5 - 5.1 mmol/L   Chloride 103 98 - 111 mmol/L   CO2 27 22 - 32 mmol/L    Glucose, Bld 117 (H) 70 - 99 mg/dL   BUN 19 8 - 23 mg/dL   Creatinine, Ser 0.85 0.44 - 1.00 mg/dL   Calcium 9.3 8.9 - 10.3 mg/dL   Total Protein 5.2 (L) 6.5 - 8.1 g/dL   Albumin 2.8 (L) 3.5 - 5.0 g/dL   AST 12 (L) 15 - 41 U/L   ALT 9 0 - 44 U/L   Alkaline Phosphatase 52 38 - 126 U/L   Total Bilirubin 0.6 0.3 - 1.2 mg/dL   GFR, Estimated >60 >60 mL/min   Anion gap 7 5 - 15  Magnesium     Status: None   Collection Time: 01/17/23  3:09 AM  Result Value Ref Range   Magnesium 1.9 1.7 - 2.4 mg/dL  PHYSICAL EXAM:   Gen: resting comfortably in bed, NAD Lungs: unlabored Ext:       Right Lower Extremity              SLS fitting well             Minimal swelling to toes             still no sensory function but can wiggle toes              Ext warm              Good perfusion distally   Assessment/Plan: 2 Days Post-Op   Principal Problem:   Closed fracture of right fibula and tibia Active Problems:   PAF (paroxysmal atrial fibrillation) (HCC)   Essential hypertension   Hyperlipidemia   Preoperative cardiovascular examination   AKI (acute kidney injury) (HCC)   Leukocytosis   GERD (gastroesophageal reflux disease)   Preop cardiovascular exam   Anti-infectives (From admission, onward)    Start     Dose/Rate Route Frequency Ordered Stop   01/15/23 2200  ceFAZolin (ANCEF) IVPB 2g/100 mL premix        2 g 200 mL/hr over 30 Minutes Intravenous Every 8 hours 01/15/23 1929 01/16/23 1448   01/15/23 1400  ceFAZolin (ANCEF) IVPB 2g/100 mL premix        2 g 200 mL/hr over 30 Minutes Intravenous To Surgery 01/14/23 1456 01/16/23 1400     .  POD/HD#: 2  89 y/o female s/p fall with closed R distal tibia and fibula fracture, R proximal fibula fracture    - R distal tibia and fibula fracture, R proximal fibula fracture s/p IMN and splinting Weightbearing NWB R leg x  6 weeks              Doubt she will be able to hop on L leg so she will likely be stand, pivot transfers                 ROM/Activity                         Unrestricted knee and hip ROM                          No ankle ROM as he is splinted               Wound care                         Splint to remain on x 2 weeks                         Pt with extensive fracture blisters and several skin tears               PT/OT              Ice and elevate for swelling and pain control    - Pain management:             Multimodal    - ABL anemia/Hemodynamics             Stable              Monitor    - Medical issues  Per medicine    - DVT/PE prophylaxis:             Resume home eliquis - ID:              Periop abx completed    - Metabolic Bone Disease:             Clinically poor bone quality              Mechanism suggestive of osteoporosis              vitamin d levels normal   - Activity:             As above                         NWB R LEx                                                  WBAT L leg and UEx      - Impediments to fracture healing:             Poor bone quality               - Dispo:             Ortho issues stable             Therapies               CIR? Vs SNF   Jari Pigg, PA-C [phone] (C) 01/17/2023, 3:05 PM  [facility] [place] [phone] Jenetta [phone] (F)    After 5pm and on the weekends please log on to Amion, go to orthopaedics and the look under the [facility] Call for the provider(s) on call. You can also call our office at [phone] and then follow the prompts to be connected to the call team.  Patient ID: [name], female   DOB: [birth year], 89 y.o.   MRN: [id number]

## 2023-01-18 ENCOUNTER — Inpatient Hospital Stay (HOSPITAL_COMMUNITY): Payer: Medicare Other

## 2023-01-18 DIAGNOSIS — I480 Paroxysmal atrial fibrillation: Secondary | ICD-10-CM | POA: Diagnosis not present

## 2023-01-18 LAB — HEMOGLOBIN AND HEMATOCRIT, BLOOD
HCT: 26.4 % — ABNORMAL LOW (ref 36.0–46.0)
Hemoglobin: 8.3 g/dL — ABNORMAL LOW (ref 12.0–15.0)

## 2023-01-18 MED ORDER — HYDROCODONE-ACETAMINOPHEN 5-325 MG PO TABS: 1.0000 | ORAL_TABLET | Freq: Three times a day (TID) | ORAL | 0 refills | Status: DC | PRN

## 2023-01-18 MED ORDER — DOCUSATE SODIUM 100 MG PO CAPS: 100.0000 mg | ORAL_CAPSULE | Freq: Two times a day (BID) | ORAL | 0 refills | Status: DC

## 2023-01-18 MED ORDER — SORBITOL 70 % SOLN
960.0000 mL | TOPICAL_OIL | Freq: Once | ORAL | Status: AC
  Administered 2023-01-18: 960 mL via RECTAL
  Filled 2023-01-18: qty 240

## 2023-01-18 MED ORDER — LACTULOSE 10 GM/15ML PO SOLN
20.0000 g | Freq: Three times a day (TID) | ORAL | Status: DC
  Filled 2023-01-18 (×3): qty 30

## 2023-01-18 MED ORDER — SORBITOL 70 % SOLN
960.0000 mL | TOPICAL_OIL | Freq: Once | ORAL | Status: DC
  Filled 2023-01-18: qty 240

## 2023-01-18 NOTE — Progress Notes (Signed)
Occupational Therapy Treatment Patient Details Name: [name] MRN: [id number] DOB: 06-05-1933 Today's Date: 01/18/2023   History of present illness Pt is an 89 y.o. female presenting to 2/25 with RLE pain and deformity following ground level fall. Found to have distal R tib/fib fracture. Now s/p intramedullary nailing of R tibia 2/27. PMH significant for osteoarthritis, atrial fibrillation on eliquis, GERD, HTN, HLD, TIA/CVA, COPD, chronic diastolic CHF.   OT comments  Pt with slow progress towards established OT goals. Pt on bed pan on arriva, so assisting with clean up. Mod A for bed mobility. Max A +2 to stand; pt with awkward posture, so sitting and performing second attempt. Pt continued to experience feeling of heaviness in RLE and poor maintenance of weightbearing precautions, also with active BM attempting to stand. Returned to supine for clean up due to need for +2 to stand. Also poor adherence to weightbearing status, so unsafe to transfer. Will continue to follow acutely.    Recommendations for follow up therapy are one component of a multi-disciplinary discharge planning process, led by the attending physician.  Recommendations may be updated based on patient status, additional functional criteria and insurance authorization.    Follow Up Recommendations  Skilled nursing-short term rehab (<3 hours/day)     Assistance Recommended at Discharge Frequent or constant Supervision/Assistance  Patient can return home with the following  Two people to help with walking and/or transfers;Two people to help with bathing/dressing/bathroom;Assistance with cooking/housework;Assist for transportation;Help with stairs or ramp for entrance   Equipment Recommendations  Other (comment) (defer)    Recommendations for Other Services      Precautions / Restrictions Precautions Precautions: Fall Restrictions Weight Bearing Restrictions: Yes RLE Weight Bearing: Non weight bearing        Mobility Bed Mobility Overal bed mobility: Needs Assistance Bed Mobility: Rolling, Supine to Sit, Sit to Supine Rolling: Mod assist   Supine to sit: Mod assist, HOB elevated, +2 for physical assistance Sit to supine: Max assist, +2 for physical assistance   General bed mobility comments: Pt required mod A +2 to sit EOB for RLE management and trunk elevation. Pt required max A +2 to return to supine due to increased abdominal pain.    Transfers Overall transfer level: Needs assistance Equipment used: Rolling walker (2 wheels) Transfers: Sit to/from Stand Sit to Stand: Max assist, +2 physical assistance           General transfer comment: Max A +2 for rise, however pt unable to tolerate standing due to RLE and abdominal pain. Cues for safe hand and BLE placement. Deferred transfer to [facility] due to pt unable to tolerate upright posture and having BM sitting EOB.     Balance Overall balance assessment: Needs assistance Sitting-balance support: Feet supported Sitting balance-Leahy Scale: Fair Sitting balance - Comments: sitting EOB   Standing balance support: Bilateral upper extremity supported, Reliant on assistive device for balance, During functional activity Standing balance-Leahy Scale: Zero Standing balance comment: with RW support and max A +2 due to pain                           ADL either performed or assessed with clinical judgement   ADL Overall ADL's : Needs assistance/impaired                         Toilet Transfer: Maximal assistance;+2 for physical assistance;+2 for safety/equipment;Rolling walker (2 wheels) Toilet Transfer Details (indicate  cue type and reason): STS this session. Pt unable to maintain NWB status, so deferring transfer to [place] and Hygiene: Total assistance;Bed level Toileting - Clothing Manipulation Details (indicate cue type and reason): soiled on arrival and again during session, getting  cleaned up twice     Functional mobility during ADLs: Maximal assistance;+2 for physical assistance;+2 for safety/equipment General ADL Comments: STS only    Extremity/Trunk Assessment Upper Extremity Assessment Upper Extremity Assessment: Generalized weakness   Lower Extremity Assessment Lower Extremity Assessment: Defer to PT evaluation        Vision       Perception     Praxis      Cognition Arousal/Alertness: Awake/alert Behavior During Therapy: WFL for tasks assessed/performed Overall Cognitive Status: Within Functional Limits for tasks assessed                                 General Comments: following all commands WFL during session        Exercises      Shoulder Instructions       General Comments Pt with complaints of increased abdominal pain this session due to not having a BM since Sunday per pt report. Pt on bedpan upon therapist arrival and noted to have BM. Pt having second BM sitting EOB.    Pertinent Vitals/ Pain       Pain Assessment Pain Assessment: Faces Faces Pain Scale: Hurts even more Pain Location: RLE and abdomen Pain Descriptors / Indicators: Operative site guarding, Guarding, Grimacing Pain Intervention(s): Limited activity within patient's tolerance, Monitored during session  Home Living                                          Prior Functioning/Environment              Frequency  Min 2X/week        Progress Toward Goals  OT Goals(current goals can now be found in the care plan section)  Progress towards OT goals: Progressing toward goals  Acute Rehab OT Goals Patient Stated Goal: have BM OT Goal Formulation: With patient/family Time For Goal Achievement: 01/30/23 Potential to Achieve Goals: Good ADL Goals Pt Will Perform Lower Body Dressing: with min assist;sitting/lateral leans Pt Will Transfer to Toilet: with min assist;squat pivot transfer;bedside commode Additional ADL Goal  #1: Pt will perform bed mobility with mod I as a precursor to ADL  Plan Discharge plan remains appropriate;Frequency remains appropriate    Co-evaluation    PT/OT/SLP Co-Evaluation/Treatment: Yes Reason for Co-Treatment: For patient/therapist safety;To address functional/ADL transfers;Other (comment) PT goals addressed during session: Mobility/safety with mobility OT goals addressed during session: ADL's and self-care;Proper use of Adaptive equipment and DME      AM-PAC OT "6 Clicks" Daily Activity     Outcome Measure   Help from another person eating meals?: None Help from another person taking care of personal grooming?: A Little Help from another person toileting, which includes using toliet, bedpan, or urinal?: A Lot Help from another person bathing (including washing, rinsing, drying)?: A Lot Help from another person to put on and taking off regular upper body clothing?: A Little Help from another person to put on and taking off regular lower body clothing?: A Lot 6 Click Score: 16    End of Session Equipment Utilized  During Treatment: Gait belt;Rolling walker (2 wheels)  OT Visit Diagnosis: Unsteadiness on feet (R26.81);Muscle weakness (generalized) (M62.81);Other abnormalities of gait and mobility (R26.89)   Activity Tolerance Patient tolerated treatment well;Patient limited by pain   Patient Left in bed;with call bell/phone within reach;with bed alarm set   Nurse Communication Mobility status        Time: [id number] OT Time Calculation (min): 46 min  Charges: OT General Charges $OT Visit: 1 Visit OT Treatments $Self Care/Home Management : 23-37 mins  Elder Cyphers, OTR/L [facility] Acute Rehabilitation Office: [phone]   Magnus Ivan 01/18/2023, 5:41 PM

## 2023-01-18 NOTE — Progress Notes (Signed)
PROGRESS NOTE  [name] [id number] DOB: 06-Dec-1932 DOA: 01/13/2023 PCP: Myrlene Broker, MD   LOS: 5 days   Brief Narrative / Interim history: [name] is a 89 y.o. female with a known history of osteoarthritis, atrial fibrillation on Eliquis, GERD, hypertension, hyperlipidemia, TIA/CVA presents to the emergency department for evaluation of right lower extremity pain status post fall.  Patient was in a usual state of health until this evening she was getting into a car when she fell landing on her right side.  Patient lives at home with her daughter and is functionally independent with the use of a walker.  She has been getting home physical therapy. In the ED, she was found to have proximal right fibula fracture and distal right tib fib fracture.  Hospitalist called for admission, orthopedic surgery consulted.     Subjective / 24h Interval events: She is not doing very well this morning, complains of abdominal pain, nausea. She reports severe constipation for several days.   Assesement and Plan: Principal Problem:   Closed fracture of right fibula and tibia Active Problems:   PAF (paroxysmal atrial fibrillation) (HCC)   Essential hypertension   Hyperlipidemia   Preoperative cardiovascular examination   AKI (acute kidney injury) (HCC)   Leukocytosis   GERD (gastroesophageal reflux disease)   Preop cardiovascular exam   Principal problem Right proximal fibula, right distal tib-fib fracture after fall -orthopedic surgery consulted, she was taken to the OR on 2/27 and is status post ORIF.  Doing well postoperatively, PT recommends SNF, placement pending.  She is back on her Eliquis for anticoagulation.  Active problems Severe constipation -patient with worsening abdominal pain today, nausea, appears to have severe constipation.  Increase bowel regimen, add lactulose, enema x 2.  AKI, resolved -Baseline creatinine 0.7, presented with creatinine 1.1, now back to normal    Leukocytosis, likely reactive -Downtrending appropriately no signs or symptoms of infection, hold antibiotics.  Repeat CBC tomorrow, she has a history of diverticulitis, if white count goes back up and with her abdominal pain will need a CT scan of the abdomen pelvis   Paroxysmal A-fib -Home medications include amiodarone, Cardizem, labetalol.  Back on Eliquis now. -Cardiology consulted for preop evaluation   Chronic diastolic heart failure -Without exacerbation currently. Continue spironolactone  Acute blood loss anemia-postoperatively, hemoglobin 8.9, no need for blood transfusions.  Repeat CBC in the morning  Hypertension -Cardizem, labetalol, spironolactone   Hyperlipidemia -Pravastatin   GERD -Protonix, Carafate  Scheduled Meds:  amiodarone  200 mg Oral Daily   apixaban  5 mg Oral BID   vitamin C  1,000 mg Oral Daily   cholecalciferol  5,000 Units Oral Daily   cyanocobalamin  1,000 mcg Oral QODAY   diltiazem  240 mg Oral Daily   docusate sodium  100 mg Oral BID   feeding supplement  237 mL Oral BID BM   gabapentin  300 mg Oral Daily   And   gabapentin  600 mg Oral QHS   labetalol  200 mg Oral BID   loratadine  10 mg Oral Daily   mirtazapine  15 mg Oral QHS   multivitamin  2 tablet Oral BID WC   multivitamin with minerals  1 tablet Oral Daily   pantoprazole  40 mg Oral Daily   polyethylene glycol  17 g Oral BID   pravastatin  20 mg Oral QHS   spironolactone  25 mg Oral Daily   sucralfate  1 g Oral TID with meals  Continuous Infusions:  sodium chloride 50 mL/hr at 01/18/23 0323   lactated ringers     promethazine (PHENERGAN) injection (IM or IVPB) Stopped (01/15/23 1006)   PRN Meds:.HYDROcodone-acetaminophen, HYDROmorphone (DILAUDID) injection, ipratropium-albuterol, methocarbamol, ondansetron (ZOFRAN) IV, promethazine (PHENERGAN) injection (IM or IVPB)  Current Outpatient Medications  Medication Instructions   acetaminophen (TYLENOL) 1,000 mg, Oral, Every 8 hours    albuterol (VENTOLIN HFA) 108 (90 Base) MCG/ACT inhaler 2 puffs, Inhalation, Every 4 hours PRN   amiodarone (PACERONE) 200 mg, Oral, Daily   cetirizine (ZYRTEC) 10 mg, Oral, Daily   cyanocobalamin 1000 MCG tablet 1 tablet, Oral, Every other day, Vitamin B12   diltiazem (CARDIZEM CD) 240 mg, Oral, Daily   docusate sodium (COLACE) 100 mg, Oral, 2 times daily   Eliquis 5 mg, Oral, 2 times daily   gabapentin (NEURONTIN) 300 mg, Oral, See admin instructions, Take one capsule (300 mg) in the morning and two capsules (600 mg) in the evening.   HYDROcodone-acetaminophen (NORCO/VICODIN) 5-325 MG tablet 1-2 tablets, Oral, Every 8 hours PRN   ipratropium-albuterol (DUONEB) 0.5-2.5 (3) MG/3ML SOLN 3 mLs, Nebulization, Every 6 hours PRN   labetalol (NORMODYNE) 200 mg, Oral, 2 times daily   Metamucil Fiber CHEW 1 tablet, Oral, Daily, After supper   methocarbamol (ROBAXIN) 750 mg, Oral, 3 times daily PRN, 1 tablet in am and 2 tablets in pm   mirtazapine (REMERON) 15 mg, Oral, Daily at bedtime   Multiple Vitamin (MULTIVITAMIN WITH MINERALS) TABS tablet 1 tablet, Oral, Daily   Multiple Vitamins-Minerals (PRESERVISION AREDS 2 PO) 1 tablet, Oral, 2 times daily, Vision Shield   Omega-3 Fatty Acids (OMEGA-3 FISH OIL PO) 2 capsules, Oral, Daily   pantoprazole (PROTONIX) 40 mg, Oral, Daily   pravastatin (PRAVACHOL) 20 mg, Oral, Daily at bedtime   spironolactone (ALDACTONE) 25 mg, Oral, Daily   sucralfate (CARAFATE) 1 g, Oral, 3 times daily with meals   vitamin C 1,000 mg, Oral, Daily   Vitamin D3 5,000 Units, Oral, Daily    Diet Orders (From admission, onward)     Start     Ordered   01/17/23 0729  Diet regular Room service appropriate? Yes; Fluid consistency: Thin  Diet effective now       Question Answer Comment  Room service appropriate? Yes   Fluid consistency: Thin      01/17/23 0729            DVT prophylaxis: SCDs Start: 01/14/23 0028 apixaban (ELIQUIS) tablet 5 mg   Lab Results   Component Value Date   PLT 202 01/17/2023      Code Status: Full Code  Family Communication: Daughter present at bedside  Status is: Inpatient  Remains inpatient appropriate because: Increased abdominal pain  Level of care: Telemetry Surgical  Consultants:  Orthopedic surgery   Objective: Vitals:   01/17/23 2010 01/18/23 0005 01/18/23 0323 01/18/23 0832  BP:  (!) 152/64 (!) 124/57 (!) 167/81  Pulse: 73 68 (!) 59   Resp:  '18 17 16  '$ Temp:  98 F (36.7 C) 97.8 F (36.6 C) 98.7 F (37.1 C)  TempSrc:  Oral Oral Oral  SpO2: 98% 97%  95%  Weight:      Height:        Intake/Output Summary (Last 24 hours) at 01/18/2023 1321 Last data filed at 01/18/2023 0900 Gross per 24 hour  Intake 120 ml  Output 1100 ml  Net -980 ml    Wt Readings from Last 3 Encounters:  01/15/23 86.2 kg  12/18/22 85.5 kg  12/18/22 85.4 kg    Examination:  Constitutional: NAD Eyes: lids and conjunctivae normal, no scleral icterus ENMT: mmm Neck: normal, supple Respiratory: clear to auscultation bilaterally, no wheezing, no crackles.  Cardiovascular: Regular rate and rhythm, no murmurs / rubs / gallops. No LE edema. Abdomen: soft, mild tenderness throughout, bowel sounds positive Skin: no rashes Neurologic: no focal deficits, equal strength  Data Reviewed: I have independently reviewed following labs and imaging studies   CBC Recent Labs  Lab 01/13/23 1927 01/14/23 0118 01/14/23 1055 01/15/23 0142 01/17/23 0309 01/18/23 0622  WBC 10.2 18.6* 15.1* 11.9* 10.8*  --   HGB 12.7 11.5* 11.3* 9.7* 8.9* 8.3*  HCT 40.3 36.4 36.6 30.9* 27.1* 26.4*  PLT 304 256 248 220 202  --   MCV 89.0 89.2 90.4 88.8 87.1  --   MCH 28.0 28.2 27.9 27.9 28.6  --   MCHC 31.5 31.6 30.9 31.4 32.8  --   RDW 14.3 14.3 14.5 14.4 14.4  --   LYMPHSABS 2.0  --   --   --   --   --   MONOABS 0.8  --   --   --   --   --   EOSABS 0.3  --   --   --   --   --   BASOSABS 0.1  --   --   --   --   --      Recent Labs   Lab 01/13/23 1927 01/14/23 0118 01/15/23 0142 01/16/23 0430 01/17/23 0309  NA 133*  --  132* 134* 137  K 4.6  --  5.2* 4.7 4.6  CL 99  --  100 103 103  CO2 22  --  '24 23 27  '$ GLUCOSE 171*  --  113* 146* 117*  BUN 25*  --  27* 17 19  CREATININE 1.10*  --  0.93 0.90 0.85  CALCIUM 9.2  --  8.6* 8.8* 9.3  AST  --   --   --   --  12*  ALT  --   --   --   --  9  ALKPHOS  --   --   --   --  52  BILITOT  --   --   --   --  0.6  ALBUMIN  --   --   --   --  2.8*  MG  --   --   --   --  1.9  INR  --  1.2  --   --   --      ------------------------------------------------------------------------------------------------------------------ No results for input(s): "CHOL", "HDL", "LDLCALC", "TRIG", "CHOLHDL", "LDLDIRECT" in the last 72 hours.  Lab Results  Component Value Date   HGBA1C 5.8 (H) 09/13/2020   ------------------------------------------------------------------------------------------------------------------ No results for input(s): "TSH", "T4TOTAL", "T3FREE", "THYROIDAB" in the last 72 hours.  Invalid input(s): "FREET3"  Cardiac Enzymes No results for input(s): "CKMB", "TROPONINI", "MYOGLOBIN" in the last 168 hours.  Invalid input(s): "CK" ------------------------------------------------------------------------------------------------------------------    Component Value Date/Time   BNP 390.2 (H) 11/24/2022 2117    CBG: No results for input(s): "GLUCAP" in the last 168 hours.  No results found for this or any previous visit (from the past 240 hour(s)).   Radiology Studies: No results found.   Marzetta Board, MD, PhD [facility]  Between 7 am - 7 pm I am available, please contact me via Amion (for emergencies) or Securechat (non urgent messages)  Between 7 pm -  7 am I am not available, please contact night coverage MD/APP via Amion

## 2023-01-18 NOTE — Progress Notes (Addendum)
Orthopaedic Trauma Service Progress Note  Patient ID: [name] MRN: [id number] DOB/AGE: 01-14-1933 89 y.o.  Subjective:  Ortho issues are doing well Primary complaint is constipation  Usually has BM daily Has not had one yet during hospitalization   She has baseline peripheral neuropathy in lower extremities   ROS As above  Objective:   VITALS:   Vitals:   01/17/23 2010 01/18/23 0005 01/18/23 0323 01/18/23 0832  BP:  (!) 152/64 (!) 124/57 (!) 167/81  Pulse: 73 68 (!) 59   Resp:  '18 17 16  '$ Temp:  98 F (36.7 C) 97.8 F (36.6 C) 98.7 F (37.1 C)  TempSrc:  Oral Oral Oral  SpO2: 98% 97%  95%  Weight:      Height:        Estimated body mass index is 32.61 kg/m as calculated from the following:   Height as of this encounter: '5\' 4"'$  (1.626 m).   Weight as of this encounter: 86.2 kg.   Intake/Output      02/29 0701 03/01 0700 03/01 0701 03/02 0700   P.O. 120    I.V. (mL/kg)     IV Piggyback     Total Intake(mL/kg) 120 (1.4)    Urine (mL/kg/hr) 300 (0.1)    Total Output 300    Net -180           LABS  Results for orders placed or performed during the hospital encounter of 01/13/23 (from the past 24 hour(s))  Hemoglobin and hematocrit, blood     Status: Abnormal   Collection Time: 01/18/23  6:22 AM  Result Value Ref Range   Hemoglobin 8.3 (L) 12.0 - 15.0 g/dL   HCT 26.4 (L) 36.0 - 46.0 %     PHYSICAL EXAM:   Gen: resting comfortably in bed, NAD, family at bedside  Lungs: unlabored Ext:       Right Lower Extremity              SLS fitting well             Minimal swelling to toes             able to wiggle toes a little  No sensation---> has peripheral neuropathy at baseline              Ext warm              Good perfusion distally   Assessment/Plan: 3 Days Post-Op   Principal Problem:   Closed fracture of right fibula and tibia Active Problems:   PAF  (paroxysmal atrial fibrillation) (HCC)   Essential hypertension   Hyperlipidemia   Preoperative cardiovascular examination   AKI (acute kidney injury) (HCC)   Leukocytosis   GERD (gastroesophageal reflux disease)   Preop cardiovascular exam   Anti-infectives (From admission, onward)    Start     Dose/Rate Route Frequency Ordered Stop   01/15/23 2200  ceFAZolin (ANCEF) IVPB 2g/100 mL premix        2 g 200 mL/hr over 30 Minutes Intravenous Every 8 hours 01/15/23 1929 01/16/23 1448   01/15/23 1400  ceFAZolin (ANCEF) IVPB 2g/100 mL premix        2 g 200 mL/hr over 30 Minutes Intravenous To Surgery 01/14/23 1456 01/16/23 1400     .  POD/HD#:  3  89 y/o female s/p fall with closed R distal tibia and fibula fracture, R proximal fibula fracture    - R distal tibia and fibula fracture, R proximal fibula fracture s/p IMN and splinting Weightbearing NWB R leg x  6 weeks              Doubt she will be able to hop on L leg so she will likely be stand, pivot transfers                ROM/Activity                         Unrestricted knee and hip ROM                          No ankle ROM as he is splinted               Wound care                         Splint to remain on x 2 weeks                         Pt with extensive fracture blisters and several skin tears               PT/OT              Ice and elevate for swelling and pain control    - Pain management:             Multimodal    - ABL anemia/Hemodynamics             Stable              Monitor    - Medical issues                           Per medicine    Constipation     Advance bowel regimen     SMOG enema ordered    - DVT/PE prophylaxis:             Resume home eliquis  - ID:              Periop abx completed    - Metabolic Bone Disease:             Clinically poor bone quality              Mechanism suggestive of osteoporosis              vitamin d levels normal    - Activity:             As above                          NWB R LEx                                                  WBAT L leg and B UEx      - Impediments to fracture healing:             Poor bone quality               -  Dispo:             Ortho issues stable             Therapies               SNF   Jari Pigg, PA-C [phone] (C) 01/18/2023, 9:33 AM  [facility] [place] [address] [phone] Jenetta [phone] (F)    After 5pm and on the weekends please log on to Amion, go to orthopaedics and the look under the [facility] Call for the provider(s) on call. You can also call our office at [phone] and then follow the prompts to be connected to the call team.  Patient ID: [name], female   DOB: 07/15/1933, 89 y.o.   MRN: [id number]

## 2023-01-18 NOTE — Progress Notes (Incomplete)
Orthopaedic Trauma Service (OTS)  3 Days Post-Op Procedure(s) (LRB): INTRAMEDULLARY (IM) NAIL  RIGHT TIBIAL (Right)  Subjective: Patient reports pain as {pain:[id number]}.    Objective: Current Vitals Blood pressure (!) 167/81, pulse (!) 59, temperature 98.7 F (37.1 C), temperature source Oral, resp. rate 16, height '5\' 4"'$  (1.626 m), weight 86.2 kg, SpO2 95 %. Vital signs in last 24 hours: Temp:  [97.8 F (36.6 C)-98.7 F (37.1 C)] 98.7 F (37.1 C) (03/01 0832) Pulse Rate:  [45-75] 59 (03/01 0323) Resp:  [16-18] 16 (03/01 0832) BP: (124-167)/(56-81) 167/81 (03/01 0832) SpO2:  [88 %-99 %] 95 % (03/01 0832)  Intake/Output from previous day: 02/29 0701 - 03/01 0700 In: 120 [P.O.:120] Out: 300 [Urine:300]  LABS Recent Labs    01/17/23 0309 01/18/23 0622  HGB 8.9* 8.3*   Recent Labs    01/17/23 0309 01/18/23 0622  WBC 10.8*  --   RBC 3.11*  --   HCT 27.1* 26.4*  PLT 202  --    Recent Labs    01/16/23 0430 01/17/23 0309  NA 134* 137  K 4.7 4.6  CL 103 103  CO2 23 27  BUN 17 19  CREATININE 0.90 0.85  GLUCOSE 146* 117*  CALCIUM 8.8* 9.3   No results for input(s): "LABPT", "INR" in the last 72 hours.   Physical Exam  RLE  Dressing intact, clean, dry  Edema/ swelling controlled  Sens: DPN, SPN, TN intact  Motor: EHL, FHL, and lessor toe ext and flex all intact grossly  Brisk cap refill, warm to touch  Assessment/Plan: 3 Days Post-Op Procedure(s) (LRB): INTRAMEDULLARY (IM) NAIL  RIGHT TIBIAL (Right) 1. PT/OT  2. DVT proph Eliquis 3. F/u 8-14 days  Altamese [place], MD [facility], [facility] [phone]

## 2023-01-18 NOTE — Discharge Instructions (Addendum)
Orthopaedic Trauma Service Discharge Instructions   General Discharge Instructions  Orthopaedic Injuries:  Right tibia and fibula shaft fractures treated with intramedullary nailing of right tibia and splinting   WEIGHT BEARING STATUS: Nonweightbearing right tibia use walker to help mobilize   RANGE OF MOTION/ACTIVITY: unrestricted range of motion of right knee.  No ankle motion as you are splinted   Bone health: vitamin d levels look good.  We can discuss a bone density scan when you come to the office   Review the following resource for additional information regarding bone health  [email or web address]  Wound Care: leave splint in place until first post op follow up. We will remove at the office   DVT/PE prophylaxis: eliquis   Diet: as you were eating previously.  Can use over the counter stool softeners and bowel preparations, such as Miralax, to help with bowel movements.  Narcotics can be constipating.  Be sure to drink plenty of fluids  PAIN MEDICATION USE AND EXPECTATIONS  You have likely been given narcotic medications to help control your pain.  After a traumatic event that results in an fracture (broken bone) with or without surgery, it is ok to use narcotic pain medications to help control one's pain.  We understand that everyone responds to pain differently and each individual patient will be evaluated on a regular basis for the continued need for narcotic medications. Ideally, narcotic medication use should last no more than 6-8 weeks (coinciding with fracture healing).   As a patient it is your responsibility as well to monitor narcotic medication use and report the amount and frequency you use these medications when you come to your office visit.   We would also advise that if you are using narcotic medications, you should take a dose prior to therapy to maximize you participation.  IF YOU ARE ON NARCOTIC MEDICATIONS IT IS NOT PERMISSIBLE TO  OPERATE A MOTOR VEHICLE (MOTORCYCLE/CAR/TRUCK/MOPED) OR HEAVY MACHINERY DO NOT MIX NARCOTICS WITH OTHER CNS (CENTRAL NERVOUS SYSTEM) DEPRESSANTS SUCH AS ALCOHOL   POST-OPERATIVE OPIOID TAPER INSTRUCTIONS: It is important to wean off of your opioid medication as soon as possible. If you do not need pain medication after your surgery it is ok to stop day one. Opioids include: Codeine, Hydrocodone(Norco, Vicodin), Oxycodone(Percocet, oxycontin) and hydromorphone amongst others.  Long term and even short term use of opiods can cause: Increased pain response Dependence Constipation Depression Respiratory depression And more.  Withdrawal symptoms can include Flu like symptoms Nausea, vomiting And more Techniques to manage these symptoms Hydrate well Eat regular healthy meals Stay active Use relaxation techniques(deep breathing, meditating, yoga) Do Not substitute Alcohol to help with tapering If you have been on opioids for less than two weeks and do not have pain than it is ok to stop all together.  Plan to wean off of opioids This plan should start within one week post op of your fracture surgery  Maintain the same interval or time between taking each dose and first decrease the dose.  Cut the total daily intake of opioids by one tablet each day Next start to increase the time between doses. The last dose that should be eliminated is the evening dose.    STOP SMOKING OR USING NICOTINE PRODUCTS!!!!  As discussed nicotine severely impairs your body's ability to heal surgical and traumatic wounds but also impairs bone healing.  Wounds and bone heal by forming microscopic blood vessels (angiogenesis) and nicotine is a vasoconstrictor (essentially, shrinks blood vessels).  Therefore, if  vasoconstriction occurs to these microscopic blood vessels they essentially disappear and are unable to deliver necessary nutrients to the healing tissue.  This is one modifiable factor that you can do to  dramatically increase your chances of healing your injury.    (This means no smoking, no nicotine gum, patches, etc)  DO NOT USE NONSTEROIDAL ANTI-INFLAMMATORY DRUGS (NSAID'S)  Using products such as Advil (ibuprofen), Aleve (naproxen), Motrin (ibuprofen) for additional pain control during fracture healing can delay and/or prevent the healing response.  If you would like to take over the counter (OTC) medication, Tylenol (acetaminophen) is ok.  However, some narcotic medications that are given for pain control contain acetaminophen as well. Therefore, you should not exceed more than 4000 mg of tylenol in a day if you do not have liver disease.  Also note that there are may OTC medicines, such as cold medicines and allergy medicines that my contain tylenol as well.  If you have any questions about medications and/or interactions please ask your doctor/PA or your pharmacist.      ICE AND ELEVATE INJURED/OPERATIVE EXTREMITY  Using ice and elevating the injured extremity above your heart can help with swelling and pain control.  Icing in a pulsatile fashion, such as 20 minutes on and 20 minutes off, can be followed.    Do not place ice directly on skin. Make sure there is a barrier between to skin and the ice pack.    Using frozen items such as frozen peas works well as the conform nicely to the are that needs to be iced.  USE AN ACE WRAP OR TED HOSE FOR SWELLING CONTROL  In addition to icing and elevation, Ace wraps or TED hose are used to help limit and resolve swelling.  It is recommended to use Ace wraps or TED hose until you are informed to stop.    When using Ace Wraps start the wrapping distally (farthest away from the body) and wrap proximally (closer to the body)   Example: If you had surgery on your leg or thing and you do not have a splint on, start the ace wrap at the toes and work your way up to the thigh        If you had surgery on your upper extremity and do not have a splint on, start  the ace wrap at your fingers and work your way up to the upper arm  IF YOU ARE IN A SPLINT OR CAST DO NOT [place]   If your splint gets wet for any reason please contact the office immediately. You may shower in your splint or cast as long as you keep it dry.  This can be done by wrapping in a cast cover or garbage back (or similar)  Do Not stick any thing down your splint or cast such as pencils, money, or hangers to try and scratch yourself with.  If you feel itchy take benadryl as prescribed on the bottle for itching  IF YOU ARE IN A CAM BOOT (BLACK BOOT)  You may remove boot periodically. Perform daily dressing changes as noted below.  Wash the liner of the boot regularly and wear a sock when wearing the boot. It is recommended that you sleep in the boot until told otherwise    Call office for the following: Temperature greater than 101F Persistent nausea and vomiting Severe uncontrolled pain Redness, tenderness, or signs of infection (pain, swelling, redness, odor or green/yellow discharge around the site)  Difficulty breathing, headache or visual disturbances Hives Persistent dizziness or light-headedness Extreme fatigue Any other questions or concerns you may have after discharge  In an emergency, call 911 or go to an Emergency Department at a nearby hospital  HELPFUL INFORMATION  If you had a block, it will wear off between 8-24 hrs postop typically.  This is period when your pain may go from nearly zero to the pain you would have had postop without the block.  This is an abrupt transition but nothing dangerous is happening.  You may take an extra dose of narcotic when this happens.  You should wean off your narcotic medicines as soon as you are able.  Most patients will be off or using minimal narcotics before their first postop appointment.   We suggest you use the pain medication the first night prior to going to bed, in order to ease any pain when the  anesthesia wears off. You should avoid taking pain medications on an empty stomach as it will make you nauseous.  Do not drink alcoholic beverages or take illicit drugs when taking pain medications.  In most states it is against the law to drive while you are in a splint or sling.  And certainly against the law to drive while taking narcotics.  You may return to work/school in the next couple of days when you feel up to it.   Pain medication may make you constipated.  Below are a few solutions to try in this order: Decrease the amount of pain medication if you aren't having pain. Drink lots of decaffeinated fluids. Drink prune juice and/or each dried prunes  If the first 3 don't work start with additional solutions Take Colace - an over-the-counter stool softener Take Senokot - an over-the-counter laxative Take Miralax - a stronger over-the-counter laxative     CALL THE OFFICE WITH ANY QUESTIONS OR CONCERNS: [phone]   VISIT OUR WEBSITE FOR ADDITIONAL INFORMATION: [email or web address]

## 2023-01-18 NOTE — Progress Notes (Signed)
Physical Therapy Treatment Patient Details Name: [name] MRN: [id number] DOB: 1933/09/06 Today's Date: 01/18/2023   History of Present Illness Pt is an 89 y.o. female presenting to 2/25 with RLE pain and deformity following ground level fall. Found to have distal R tib/fib fracture. Now s/p intramedullary nailing of R tibia 2/27. PMH significant for osteoarthritis, atrial fibrillation on eliquis, GERD, HTN, HLD, TIA/CVA, COPD, chronic diastolic CHF.    PT Comments    Pt was received in supine and agreeable to session. Pt was noted to be on bedpan and have BM upon arrival. Pt was able to roll with mod A for hygiene. Pt with complaints on abdominal pain due to lack of BM since Sunday. Pt was able to sit EOB and perform one stand with up to max A +2, but deferred transfer to [facility] due to pain. Pt was noted to have second BM and was returned to supine. Pt performed additional rolling with mod A for hygiene and linen change. Pt continues to benefit from PT services to progress toward functional mobility goals.     Recommendations for follow up therapy are one component of a multi-disciplinary discharge planning process, led by the attending physician.  Recommendations may be updated based on patient status, additional functional criteria and insurance authorization.  Follow Up Recommendations  Skilled nursing-short term rehab (<3 hours/day) Can patient physically be transported by private vehicle: No   Assistance Recommended at Discharge Frequent or constant Supervision/Assistance  Patient can return home with the following A lot of help with walking and/or transfers;A lot of help with bathing/dressing/bathroom;Assistance with cooking/housework;Help with stairs or ramp for entrance;Assist for transportation   Equipment Recommendations  Rolling walker (2 wheels);Wheelchair (measurements PT);Wheelchair cushion (measurements PT)    Recommendations for Other Services       Precautions /  Restrictions Precautions Precautions: Fall Restrictions Weight Bearing Restrictions: Yes RLE Weight Bearing: Non weight bearing     Mobility  Bed Mobility Overal bed mobility: Needs Assistance Bed Mobility: Rolling, Supine to Sit, Sit to Supine Rolling: Mod assist   Supine to sit: Mod assist, HOB elevated, +2 for physical assistance Sit to supine: Max assist, +2 for physical assistance   General bed mobility comments: Pt required mod A +2 to sit EOB for RLE management and trunk elevation. Pt required max A +2 to return to supine due to increased abdominal pain.    Transfers Overall transfer level: Needs assistance Equipment used: Rolling walker (2 wheels) Transfers: Sit to/from Stand Sit to Stand: Max assist, +2 physical assistance           General transfer comment: Max A +2 for rise, however pt unable to tolerate standing due to RLE and abdominal pain. Cues for safe hand and BLE placement. Deferred transfer to [facility] due to pt unable to tolerate upright posture and having BM sitting EOB.       Balance Overall balance assessment: Needs assistance Sitting-balance support: Feet supported Sitting balance-Leahy Scale: Fair Sitting balance - Comments: sitting EOB   Standing balance support: Bilateral upper extremity supported, Reliant on assistive device for balance, During functional activity Standing balance-Leahy Scale: Poor Standing balance comment: with RW support and max A +2 due to pain                            Cognition Arousal/Alertness: Awake/alert Behavior During Therapy: WFL for tasks assessed/performed Overall Cognitive Status: Within Functional Limits for tasks assessed  Exercises      General Comments General comments (skin integrity, edema, etc.): Pt with complaints of increased abdominal pain this session due to not having a BM since Sunday per pt report. Pt on bedpan upon therapist  arrival and noted to have BM. Pt having second BM sitting EOB.      Pertinent Vitals/Pain Pain Assessment Pain Assessment: Faces Faces Pain Scale: Hurts even more Pain Location: RLE and abdomen Pain Descriptors / Indicators: Operative site guarding, Guarding, Grimacing Pain Intervention(s): Limited activity within patient's tolerance, Monitored during session, Repositioned     PT Goals (current goals can now be found in the care plan section) Acute Rehab PT Goals Patient Stated Goal: Did not specifically state; agreeable to getting OOB PT Goal Formulation: With patient Time For Goal Achievement: 01/30/23 Potential to Achieve Goals: Good Progress towards PT goals: Progressing toward goals    Frequency    Min 3X/week      PT Plan Current plan remains appropriate    Co-evaluation PT/OT/SLP Co-Evaluation/Treatment: Yes Reason for Co-Treatment: For patient/therapist safety;To address functional/ADL transfers;Other (comment) PT goals addressed during session: Mobility/safety with mobility        AM-PAC PT "6 Clicks" Mobility   Outcome Measure  Help needed turning from your back to your side while in a flat bed without using bedrails?: A Lot Help needed moving from lying on your back to sitting on the side of a flat bed without using bedrails?: A Lot Help needed moving to and from a bed to a chair (including a wheelchair)?: Total Help needed standing up from a chair using your arms (e.g., wheelchair or bedside chair)?: Total Help needed to walk in hospital room?: Total Help needed climbing 3-5 steps with a railing? : Total 6 Click Score: 8    End of Session Equipment Utilized During Treatment: Gait belt;Oxygen Activity Tolerance: Patient limited by pain Patient left: in bed;with bed alarm set;with call bell/phone within reach;with family/visitor present Nurse Communication: Mobility status PT Visit Diagnosis: Unsteadiness on feet (R26.81);Other abnormalities of gait and  mobility (R26.89);Pain     Time: [id number] PT Time Calculation (min) (ACUTE ONLY): 40 min  Charges:  $Therapeutic Activity: 8-22 mins                     Michelle Nasuti, PTA Acute Rehabilitation Services Secure Chat Preferred  [phone]    Michelle Nasuti 01/18/2023, 3:53 PM

## 2023-01-18 NOTE — TOC Progression Note (Signed)
Transition of Care [facility]) - Progression Note    Patient Details  Name: [name] MRN: [id number] Date of Birth: October 05, 1933  [facility]) CM/SW Contact  Joanne Chars, LCSW Phone Number: 01/18/2023, 1:28 PM  Clinical Narrative:   Per MD, no DC today.  CSW confirmed with [facility] that she can accept pt over the weekend if medically ready.  Lorenza Chick is weekend contact.      Expected Discharge Plan: [place] Barriers to Discharge: Continued Medical Work up, SNF Pending bed offer  Expected Discharge Plan and Services In-house Referral: Clinical Social Work   Post Acute Care Choice: [place] Living arrangements for the past 2 months: Single Family Home                                       Social Determinants of Health (SDOH) Interventions SDOH Screenings   Food Insecurity: No Food Insecurity (11/25/2022)  Housing: Low Risk  (11/25/2022)  Transportation Needs: No Transportation Needs (11/25/2022)  Utilities: Not At Risk (11/25/2022)  Tobacco Use: Low Risk  (01/16/2023)    Readmission Risk Interventions     No data to display

## 2023-01-19 DIAGNOSIS — N179 Acute kidney failure, unspecified: Secondary | ICD-10-CM

## 2023-01-19 DIAGNOSIS — S82401D Unspecified fracture of shaft of right fibula, subsequent encounter for closed fracture with routine healing: Secondary | ICD-10-CM

## 2023-01-19 DIAGNOSIS — I10 Essential (primary) hypertension: Secondary | ICD-10-CM

## 2023-01-19 DIAGNOSIS — S82201D Unspecified fracture of shaft of right tibia, subsequent encounter for closed fracture with routine healing: Secondary | ICD-10-CM

## 2023-01-19 DIAGNOSIS — I480 Paroxysmal atrial fibrillation: Secondary | ICD-10-CM | POA: Diagnosis not present

## 2023-01-19 LAB — COMPREHENSIVE METABOLIC PANEL
ALT: 10 U/L (ref 0–44)
AST: 12 U/L — ABNORMAL LOW (ref 15–41)
Albumin: 2.7 g/dL — ABNORMAL LOW (ref 3.5–5.0)
Alkaline Phosphatase: 53 U/L (ref 38–126)
Anion gap: 9 (ref 5–15)
BUN: 15 mg/dL (ref 8–23)
CO2: 27 mmol/L (ref 22–32)
Calcium: 9.4 mg/dL (ref 8.9–10.3)
Chloride: 101 mmol/L (ref 98–111)
Creatinine, Ser: 0.91 mg/dL (ref 0.44–1.00)
GFR, Estimated: >60 mL/min (ref >60)
Glucose, Bld: 101 mg/dL — ABNORMAL HIGH (ref 70–99)
Potassium: 4.4 mmol/L (ref 3.5–5.1)
Sodium: 137 mmol/L (ref 135–145)
Total Bilirubin: 0.6 mg/dL (ref 0.3–1.2)
Total Protein: 5.3 g/dL — ABNORMAL LOW (ref 6.5–8.1)

## 2023-01-19 LAB — MAGNESIUM: Magnesium: 1.8 mg/dL (ref 1.7–2.4)

## 2023-01-19 LAB — CBC
HCT: 30.9 % — ABNORMAL LOW (ref 36.0–46.0)
Hemoglobin: 9.9 g/dL — ABNORMAL LOW (ref 12.0–15.0)
MCH: 28.3 pg (ref 26.0–34.0)
MCHC: 32 g/dL (ref 30.0–36.0)
MCV: 88.3 fL (ref 80.0–100.0)
Platelets: 226 10*3/uL (ref 150–400)
RBC: 3.5 MIL/uL — ABNORMAL LOW (ref 3.87–5.11)
RDW: 14.5 % (ref 11.5–15.5)
WBC: 11.4 10*3/uL — ABNORMAL HIGH (ref 4.0–10.5)
nRBC: 0 % (ref 0.0–0.2)

## 2023-01-19 MED ORDER — ONDANSETRON HCL 4 MG PO TABS: 4.0000 mg | ORAL_TABLET | Freq: Once | ORAL | Status: DC

## 2023-01-19 MED ORDER — LACTULOSE 10 GM/15ML PO SOLN: 10.0000 g | Freq: Two times a day (BID) | ORAL | 0 refills | Status: DC

## 2023-01-19 NOTE — Plan of Care (Signed)

## 2023-01-19 NOTE — TOC Progression Note (Addendum)
Transition of Care [facility]) - Progression Note    Patient Details  Name: [name] MRN: [id number] Date of Birth: 1933-08-01  Transition of Care [facility]) CM/SW [place], [place] Phone Number: 01/19/2023, 9:15 AM  Clinical Narrative:     SW spoke with front desk at [facility] [phone]) reports not aware of admission.   SW left VM with Lorenza Chick [facility] [phone]) to confirm ready to accept pt  Update 950am SW informed by Lorenza Chick [facility]) pt able to come.  PTAR called  Expected Discharge Plan: [place] Barriers to Discharge: Continued Medical Work up, SNF Pending bed offer  Expected Discharge Plan and Services In-house Referral: Clinical Social Work   Post Acute Care Choice: [place] Living arrangements for the past 2 months: Single Family Home Expected Discharge Date: 01/19/23                                     Social Determinants of Health (SDOH) Interventions SDOH Screenings   Food Insecurity: No Food Insecurity (11/25/2022)  Housing: Low Risk  (11/25/2022)  Transportation Needs: No Transportation Needs (11/25/2022)  Utilities: Not At Risk (11/25/2022)  Tobacco Use: Low Risk  (01/16/2023)    Readmission Risk Interventions     No data to display

## 2023-01-19 NOTE — Discharge Summary (Signed)
Physician Discharge Summary  [name] [id number] DOB: Oct 10, 1933 DOA: 01/13/2023  PCP: Myrlene Broker, MD  Admit date: 01/13/2023 Discharge date: 01/19/2023  Admitted From: home Disposition:  SNF  Recommendations for Outpatient Follow-up:  Follow up with PCP in 1-2 weeks Follow up with Orthopedic surgery in 2 weeks  Home Health: none Equipment/Devices: none  Discharge Condition: stable CODE STATUS: Full code Diet Orders (From admission, onward)     Start     Ordered   01/17/23 0729  Diet regular Room service appropriate? Yes; Fluid consistency: Thin  Diet effective now       Question Answer Comment  Room service appropriate? Yes   Fluid consistency: Thin      01/17/23 0729           HPI: Per admitting MD, [name] is a 89 y.o. female with a known history of osteoarthritis, atrial fibrillation on Eliquis, GERD, hypertension, hyperlipidemia, TIA/CVA presents to the emergency department for evaluation of right lower extremity pain status post fall.  Patient was in a usual state of health until this evening she was getting into a car when she fell landing on her right side.  Patient lives at home with her daughter and is functionally independent with the use of a walker.  She has been getting home physical therapy. Otherwise there has been no change in status. Patient has been taking medication as prescribed and there has been no recent change in medication or diet.  No recent antibiotics.  There has been no recent illness, hospitalizations, travel or sick contacts.  Hospital Course / Discharge diagnoses: Principal Problem:   Closed fracture of right fibula and tibia Active Problems:   PAF (paroxysmal atrial fibrillation) (HCC)   Essential hypertension   Hyperlipidemia   Preoperative cardiovascular examination   AKI (acute kidney injury) (HCC)   Leukocytosis   GERD (gastroesophageal reflux disease)   Preop cardiovascular exam   Principal problem Right  proximal fibula, right distal tib-fib fracture after fall -orthopedic surgery consulted, she was taken to the OR on 2/27 and is status post ORIF.  Doing well postoperatively, PT recommends SNF.  She is back on her Eliquis for anticoagulation.   Active problems Severe constipation -resolved. Continue aggressive bowel regimen  AKI, resolved -Baseline creatinine 0.7, presented with creatinine 1.1, now back to normal Leukocytosis, likely reactive -improved Paroxysmal A-fib -Home medications include amiodarone, Cardizem, labetalol.  Back on Eliquis now. Cardiology consulted and followed patient while hospitalized Chronic diastolic heart failure -Without exacerbation currently. Continue spironolactone Acute blood loss anemia-postoperatively, hemoglobin stable, improving on its own, she did not require blood transfusions. No bleeding Hypertension -Cardizem, labetalol, spironolactone Hyperlipidemia -Pravastatin GERD -Protonix, Carafate  Sepsis ruled out   Discharge Instructions   Allergies as of 01/19/2023       Reactions   Keflex [cephalexin] Other (See Comments)   "makes pt feel really bad" Tolerated Cephalosporin Date: 01/15/23.   Morphine Anxiety, Other (See Comments)   Pt states this made her feel like she was going to die        Medication List     TAKE these medications    acetaminophen 500 MG tablet Commonly known as: TYLENOL Take 2 tablets (1,000 mg total) by mouth every 8 (eight) hours. What changed:  when to take this reasons to take this   albuterol 108 (90 Base) MCG/ACT inhaler Commonly known as: VENTOLIN HFA Inhale 2 puffs into the lungs every 4 (four) hours as needed for wheezing or shortness of  breath.   amiodarone 200 MG tablet Commonly known as: Pacerone Take 1 tablet (200 mg total) by mouth daily.   cetirizine 10 MG tablet Commonly known as: ZYRTEC Take 10 mg by mouth daily.   cyanocobalamin 1000 MCG tablet Take 1 tablet by mouth every other day.  Vitamin B12   diltiazem 240 MG 24 hr capsule Commonly known as: CARDIZEM CD Take 1 capsule (240 mg total) by mouth daily.   docusate sodium 100 MG capsule Commonly known as: COLACE Take 1 capsule (100 mg total) by mouth 2 (two) times daily.   Eliquis 5 MG Tabs tablet Generic drug: apixaban TAKE 1 TABLET BY MOUTH TWICE A DAY   gabapentin 300 MG capsule Commonly known as: NEURONTIN Take 300 mg by mouth See admin instructions. Take one capsule (300 mg) in the morning and two capsules (600 mg) in the evening.   HYDROcodone-acetaminophen 5-325 MG tablet Commonly known as: NORCO/VICODIN Take 1-2 tablets by mouth every 8 (eight) hours as needed for moderate pain.   ipratropium-albuterol 0.5-2.5 (3) MG/3ML Soln Commonly known as: DUONEB Take 3 mLs by nebulization every 6 (six) hours as needed (shortness of breath).   labetalol 200 MG tablet Commonly known as: NORMODYNE Take 200 mg by mouth 2 (two) times daily.   lactulose 10 GM/15ML solution Commonly known as: CHRONULAC Take 15 mLs (10 g total) by mouth 2 (two) times daily.   Metamucil Fiber Chew Chew 1 tablet by mouth daily. After supper   methocarbamol 750 MG tablet Commonly known as: ROBAXIN Take 750 mg by mouth 3 (three) times daily as needed for muscle spasms. 1 tablet in am and 2 tablets in pm   mirtazapine 15 MG tablet Commonly known as: REMERON Take 15 mg by mouth at bedtime.   multivitamin with minerals Tabs tablet Take 1 tablet by mouth daily.   OMEGA-3 FISH OIL PO Take 2 capsules by mouth daily.   pantoprazole 40 MG tablet Commonly known as: PROTONIX Take 40 mg by mouth daily.   pravastatin 20 MG tablet Commonly known as: PRAVACHOL Take 20 mg by mouth at bedtime.   PRESERVISION AREDS 2 PO Take 1 tablet by mouth 2 (two) times daily. Vision Shield   spironolactone 25 MG tablet Commonly known as: ALDACTONE Take 25 mg by mouth daily.   sucralfate 1 g tablet Commonly known as: CARAFATE Take 1 g by  mouth with breakfast, with lunch, and with evening meal.   vitamin C 1000 MG tablet Take 1,000 mg by mouth daily.   Vitamin D3 125 MCG (5000 UT) Tabs Generic drug: Cholecalciferol Take 5,000 Units by mouth daily.        Follow-up Information     Altamese Olivet, MD. Schedule an appointment as soon as possible for a visit in 10 day(s).   Specialty: Orthopedic Surgery Contact information: [place] [address] [phone]                 Consultations: Orthopedic surgery  Cardiology   Procedures/Studies:  DG Abd Portable 2V  Result Date: 01/18/2023 CLINICAL DATA:  Ileus EXAM: PORTABLE ABDOMEN - 2 VIEW COMPARISON:  None Available. FINDINGS: Bowel gas pattern is nonspecific. In the upright portable view, there is no definite demonstrable pneumoperitoneum. Visualized lower lung fields are clear. Flattening of diaphragms may suggest COPD. No abnormal masses are seen. Vascular calcifications are seen. There is previous left hip arthroplasty. There is deformity at the junction of right inferior pubic ramus and ischium which is not  fully evaluated. This may be residual from previous injury. There is also deformity at the junction of right superior pubic ramus and acetabulum, possibly old fracture. IMPRESSION: Bowel gas pattern is nonspecific. Electronically Signed   By: Elmer Picker M.D.   On: 01/18/2023 18:07   DG Tibia/Fibula Right Port  Result Date: 01/15/2023 CLINICAL DATA:  96051 Fracture 96051 EXAM: PORTABLE RIGHT TIBIA AND FIBULA - 2 VIEW COMPARISON:  01/13/2023 FINDINGS: Postsurgical changes of intramedullary nailing of the tibia for a distal tibia fracture. Improved fracture alignment. Intact hardware. Expected right knee joint effusion. Tricompartment osteoarthritis of the knee. Improved alignment of the proximal and distal fibula fractures. Splint is in place. IMPRESSION: Intramedullary nail fixation of the tibia without evidence of immediate  complication. Improved distal tibia fracture alignment. Improved proximal and distal fibula fracture alignment. Electronically Signed   By: Maurine Simmering M.D.   On: 01/15/2023 16:37   DG Tibia/Fibula Right  Result Date: 01/15/2023 CLINICAL DATA:  Intramedullary nail fixation EXAM: RIGHT TIBIA AND FIBULA - 2 VIEW; DG C-ARM 1-60 MIN-NO REPORT COMPARISON:  01/13/2023 FLUOROSCOPY: Air kerma 5.21 mGy FINDINGS: Intraoperative fluoroscopic images of the right tibia and fibula demonstrate intramedullary nail fixation of the tibia about distal fractures. IMPRESSION: Intraoperative fluoroscopic images of the right tibia and fibula demonstrate intramedullary nail fixation of the tibia about distal fractures. Electronically Signed   By: Delanna Ahmadi M.D.   On: 01/15/2023 15:22   DG C-Arm 1-60 Min-No Report  Result Date: 01/15/2023 Fluoroscopy was utilized by the requesting physician.  No radiographic interpretation.   DG C-Arm 1-60 Min-No Report  Result Date: 01/15/2023 Fluoroscopy was utilized by the requesting physician.  No radiographic interpretation.   DG Tibia/Fibula Right  Result Date: 01/13/2023 CLINICAL DATA:  Status post fall. EXAM: RIGHT TIBIA AND FIBULA - 2 VIEW COMPARISON:  None Available. FINDINGS: Acute, mildly displaced fracture deformities are seen involving the shafts of the distal right tibia and distal right fibula. An additional nondisplaced fracture of the shaft of the proximal right fibula is seen. There is no evidence of dislocation. Soft tissue swelling is seen along the previously noted fracture sites. IMPRESSION: Acute fractures of the right tibia and right fibula, as described above. Electronically Signed   By: Virgina Norfolk M.D.   On: 01/13/2023 20:42   DG Ankle Right Port  Result Date: 01/13/2023 CLINICAL DATA:  Status post fall. EXAM: PORTABLE RIGHT ANKLE - 2 VIEW COMPARISON:  None Available. FINDINGS: The right ankle was imaged in a fiberglass cast with subsequently  obscured osseous detail. Acute, mildly displaced fracture deformities are seen involving the shafts of the distal right tibia and distal right fibula. There is no evidence of dislocation. Soft tissue swelling is seen along the previously noted fracture sites. IMPRESSION: Acute fracture deformities of the distal right tibia and distal right fibula. Electronically Signed   By: Virgina Norfolk M.D.   On: 01/13/2023 20:39   DG Chest Port 1 View  Result Date: 01/13/2023 CLINICAL DATA:  Status post fall. EXAM: PORTABLE CHEST 1 VIEW COMPARISON:  December 02, 2022 FINDINGS: The heart size and mediastinal contours are within normal limits. The lungs are hyperinflated. Mild, stable airspace disease is seen within the right upper lobe. A subcentimeter calcified lung nodule is seen within the lateral aspect of the mid left lung. There is no evidence of a pleural effusion or pneumothorax. The visualized skeletal structures are unremarkable. IMPRESSION: Mild right upper lobe airspace disease, unchanged in severity when compared to the prior  study. Electronically Signed   By: Virgina Norfolk M.D.   On: 01/13/2023 20:38     Subjective: - no chest pain, shortness of breath, no abdominal pain, nausea or vomiting.   Discharge Exam: BP 99/75 (BP Location: Right Arm)   Pulse 68   Temp 99.3 F (37.4 C) (Oral)   Resp 16   Ht '5\' 4"'$  (1.626 m)   Wt 88.1 kg   SpO2 93%   BMI 33.34 kg/m   General: Pt is alert, awake, not in acute distress Cardiovascular: RRR, S1/S2 +, no rubs, no gallops Respiratory: CTA bilaterally, no wheezing, no rhonchi Abdominal: Soft, NT, ND, bowel sounds + Extremities: no edema, no cyanosis    The results of significant diagnostics from this hospitalization (including imaging, microbiology, ancillary and laboratory) are listed below for reference.     Microbiology: No results found for this or any previous visit (from the past 240 hour(s)).   Labs: Basic Metabolic Panel: Recent  Labs  Lab 01/13/23 1927 01/15/23 0142 01/16/23 0430 01/17/23 0309 01/19/23 0725  NA 133* 132* 134* 137 137  K 4.6 5.2* 4.7 4.6 4.4  CL 99 100 103 103 101  CO2 '22 24 23 27 27  '$ GLUCOSE 171* 113* 146* 117* 101*  BUN 25* 27* '17 19 15  '$ CREATININE 1.10* 0.93 0.90 0.85 0.91  CALCIUM 9.2 8.6* 8.8* 9.3 9.4  MG  --   --   --  1.9 1.8   Liver Function Tests: Recent Labs  Lab 01/17/23 0309 01/19/23 0725  AST 12* 12*  ALT 9 10  ALKPHOS 52 53  BILITOT 0.6 0.6  PROT 5.2* 5.3*  ALBUMIN 2.8* 2.7*   CBC: Recent Labs  Lab 01/13/23 1927 01/14/23 0118 01/14/23 1055 01/15/23 0142 01/17/23 0309 01/18/23 0622 01/19/23 0725  WBC 10.2 18.6* 15.1* 11.9* 10.8*  --  11.4*  NEUTROABS 6.9  --   --   --   --   --   --   HGB 12.7 11.5* 11.3* 9.7* 8.9* 8.3* 9.9*  HCT 40.3 36.4 36.6 30.9* 27.1* 26.4* 30.9*  MCV 89.0 89.2 90.4 88.8 87.1  --  88.3  PLT 304 256 248 220 202  --  226   CBG: No results for input(s): "GLUCAP" in the last 168 hours. Hgb A1c No results for input(s): "HGBA1C" in the last 72 hours. Lipid Profile No results for input(s): "CHOL", "HDL", "LDLCALC", "TRIG", "CHOLHDL", "LDLDIRECT" in the last 72 hours. Thyroid function studies No results for input(s): "TSH", "T4TOTAL", "T3FREE", "THYROIDAB" in the last 72 hours.  Invalid input(s): "FREET3" Urinalysis    Component Value Date/Time   COLORURINE STRAW (A) 11/24/2022 [place] 11/24/2022 0833   LABSPEC 1.008 11/24/2022 0833   PHURINE 5.0 11/24/2022 0833   GLUCOSEU NEGATIVE 11/24/2022 0833   HGBUR NEGATIVE 11/24/2022 0833   [place] NEGATIVE 11/24/2022 0833   KETONESUR NEGATIVE 11/24/2022 0833   PROTEINUR NEGATIVE 11/24/2022 0833   NITRITE NEGATIVE 11/24/2022 0833   LEUKOCYTESUR NEGATIVE 11/24/2022 [id number]    FURTHER DISCHARGE INSTRUCTIONS:   Get Medicines reviewed and adjusted: Please take all your medications with you for your next visit with your Primary MD   Laboratory/radiological  data: Please request your Primary MD to go over all hospital tests and procedure/radiological results at the follow up, please ask your Primary MD to get all Hospital records sent to his/her office.   In some cases, they will be blood work, cultures and biopsy results pending at the time of your  discharge. Please request that your primary care M.D. goes through all the records of your hospital data and follows up on these results.   Also Note the following: If you experience worsening of your admission symptoms, develop shortness of breath, life threatening emergency, suicidal or homicidal thoughts you must seek medical attention immediately by calling 911 or calling your MD immediately  if symptoms less severe.   You must read complete instructions/literature along with all the possible adverse reactions/side effects for all the Medicines you take and that have been prescribed to you. Take any new Medicines after you have completely understood and accpet all the possible adverse reactions/side effects.    Do not drive when taking Pain medications or sleeping medications (Benzodaizepines)   Do not take more than prescribed Pain, Sleep and Anxiety Medications. It is not advisable to combine anxiety,sleep and pain medications without talking with your primary care practitioner   Special Instructions: If you have smoked or chewed Tobacco  in the last 2 yrs please stop smoking, stop any regular Alcohol  and or any Recreational drug use.   Wear Seat belts while driving.   Please note: You were cared for by a hospitalist during your hospital stay. Once you are discharged, your primary care physician will handle any further medical issues. Please note that NO REFILLS for any discharge medications will be authorized once you are discharged, as it is imperative that you return to your primary care physician (or establish a relationship with a primary care physician if you do not have one) for your post  hospital discharge needs so that they can reassess your need for medications and monitor your lab values.  Time coordinating discharge: 35 minutes  SIGNED:  Marzetta Board, MD, PhD 01/19/2023, 8:18 AM

## 2023-01-19 NOTE — TOC Transition Note (Addendum)
Transition of Care [facility]) - CM/SW Discharge Note   Patient Details  Name: [name] MRN: [id number] Date of Birth: 1933-08-13  [facility] CM/SW Contact:  Ina Homes, [place] Phone Number: 01/19/2023, 10:16 AM   Clinical Narrative:     Pt d/c to [facility] via [place]  Call Report: [phone] Room : 207  Final next level of care: [place] Barriers to Discharge: Barriers Resolved   Patient Goals and CMS Choice CMS Medicare.gov Compare Post Acute Care list provided to:: Patient Represenative (must comment) (daughter Fraser Din) Choice offered to / list presented to : Adult Children  Discharge Placement                Patient chooses bed at: [facility] Patient to be transferred to facility by: [facility]   Patient and family notified of of transfer: 01/19/23  Discharge Plan and Services Additional resources added to the After Visit Summary for   In-house Referral: Clinical Social Work   Post Acute Care Choice: [place]                               Social Determinants of Health (SDOH) Interventions SDOH Screenings   Food Insecurity: No Food Insecurity (11/25/2022)  Housing: Low Risk  (11/25/2022)  Transportation Needs: No Transportation Needs (11/25/2022)  Utilities: Not At Risk (11/25/2022)  Tobacco Use: Low Risk  (01/16/2023)     Readmission Risk Interventions     No data to display

## 2023-01-31 NOTE — Op Note (Signed)
01/31/2023 10:52 PM  PATIENT:  [name] 89 y.o.   DATE OF BIRTH: 01-Apr-1933  MEDICAL RECORD NUMBER: [id number]  PRE-OPERATIVE DIAGNOSIS:   1. COMMINUTED RIGHT TIBIA FRACTURE 2. SEVERE OSTEOPOROSIS  POST-OPERATIVE DIAGNOSIS:  1. COMMINUTED RIGHT TIBIA FRACTURE 2. SEVERE OSTEOPOROSIS  PROCEDURE:  Procedure(s): 1. INTRAMEDULLARY NAILING RIGHT TIBIA with Biomet Versanail 10 X 345 mm, statically locked 2. MANUAL APPLICATION OF STRESS UNDER FLUOROSCOPY RIGHT ANKLE SYNDESMOSIS  SURGEON:  Surgeon(s) and Role:    Altamese Bonsall, MD - Primary  ASSISTANTS: Ainsley Spinner, PA-C  ANESTHESIA:   none  EBL:  Minimal   BLOOD ADMINISTERED: None  DRAINS: None   LOCAL MEDICATIONS USED:  NONE  SPECIMEN:  No Specimen  DISPOSITION OF SPECIMEN:  N/A  COUNTS:  YES  TOURNIQUET:  * No tourniquets in log *  DICTATION: .Note written in EPIC  PLAN OF CARE: Admit to inpatient   PATIENT DISPOSITION:  PACU - hemodynamically stable.   Delay start of Pharmacological VTE agent (>24hrs) due to surgical blood loss or risk of bleeding: no  BRIEF SUMMARY AND INDICATIONS FOR PROCEDURE:  [name] is a 89 y.o. who sustained a tibia fracture from ground level fall. Patient denied increasing pain or paresthesia. I also discussed with the patient and her daughter and son-in-law the risks and benefits of surgery, including the possibility of infection, nerve injury, vessel injury, wound breakdown, arthritis, symptomatic hardware, DVT/ PE, loss of motion, malunion, nonunion, heart attack, stroke, prolonged intubation, and need for further surgery among others. These risks were acknowledged and consent given to proceed.  BRIEF SUMMARY OF PROCEDURE:  The patient was taken to the operating room after administration of Ancef for antibiotics.  The operative extremity was prepped and draped in the usual fashion.  No tourniquet was used during the procedure.  A 2.5-cm incision was made at the base of  the distal pole of patella and extended proximally. A medial parapatellar incision was made, and then the curved cannulated awl advanced into the center of the proximal tibia just medial to the lateral tibial spine and just anterior to the joint surface.  A guidewire was then advanced across the fracture site into the middle of the plafond and checked on AP and LAT images, measuring for nail length on the lateral.  We then performed sequential reaming, encountering chatter at 10.5 mm, reaming up to 11 mm and placing a 10 x 345 mm nail. We were careful to watch alignment throughout and make sure distal locking bolts were anterior to the fibula. After placing both the distal locks, back slapping was performed to interdigitate the fracture, which resulted in an excellent reduction. Two proximal locks were placed off the jig and checked for position and length.  An assistant was required for the procedure as my assistant performed the reaming and proximal instrumentation while I held reduction.  Because of her Weber B fracture pattern I also applied manual stress to the ankle with external rotation under live fluoro and did not identify any widening of the syndesmosis or the medial clear space.  Standard layered closure was performed. Ainsley Spinner, PA-C assisted during reaming and nail placement, as well as wound closure.  The patient was taken to the PACU in stable condition after application of sterile gently compressive dressings.  PROGNOSIS:  The patient will be weightbearing as tolerated with unrestricted motion of the knee and ankle for the next 6 weeks. CAM boot for support as needed. Lovenox for DVT prophylaxis. F/u in  the office in 10-14 days for removal of sutures.     [name]. Marcelino Scot, M.D.

## 2023-06-18 ENCOUNTER — Ambulatory Visit: Payer: Medicare Other | Admitting: Internal Medicine

## 2023-08-21 ENCOUNTER — Ambulatory Visit: Payer: Medicare Other | Attending: Internal Medicine | Admitting: Internal Medicine

## 2023-08-21 ENCOUNTER — Encounter: Payer: Self-pay | Admitting: Internal Medicine

## 2023-08-21 VITALS — BP 148/69 | HR 63 | Wt 199.8 lb

## 2023-08-21 DIAGNOSIS — I480 Paroxysmal atrial fibrillation: Secondary | ICD-10-CM | POA: Insufficient documentation

## 2023-08-21 NOTE — Patient Instructions (Signed)
    Follow-Up: At [facility], you and your health needs are our priority.  As part of our continuing mission to provide you with exceptional heart care, we have created designated Provider Care Teams.  These Care Teams include your primary Cardiologist (physician) and Advanced Practice Providers (APPs -  Physician Assistants and Nurse Practitioners) who all work together to provide you with the care you need, when you need it.  We recommend signing up for the patient portal called "MyChart".  Sign up information is provided on this After Visit Summary.  MyChart is used to connect with patients for Virtual Visits (Telemedicine).  Patients are able to view lab/test results, encounter notes, upcoming appointments, etc.  Non-urgent messages can be sent to your provider as well.   To learn more about what you can do with MyChart, go to [email or web address].    Your next appointment:   6 month(s)  The format for your next appointment:   In Person  Provider:   Maisie Fus, MD

## 2023-08-21 NOTE — Progress Notes (Signed)
Cardiology Office Note:    Date:  08/21/2023   ID:  [name], DOB 07-08-1933, MRN [id number]  PCP:  Hadley Pen, MD   [facility] Providers Cardiologist:  Maisie Fus, MD     Referring MD: Hadley Pen, MD   No chief complaint on file. Afib  History of Present Illness:    [name] is a 89 y.o. female with a hx of paroxysmal afib 03/2020, TIA 06/2020, HTN, COPD, HLD referral for hospital FU  She was admitted on 11/24/2022 - 1/16 for SOB.She was having flulike symptoms for a week and was tested positive for influenza B as an outpatient.  Chest x-ray had shown right upper lobe infiltrate was prescribed antibiotic but she had progressive worsening shortness of breath, cough.  On presentation, she was in A-fib with RVR. I saw her in the hospital. Started amiodarone.  She was in sinus rhythm until she had PNA per her son.  Prior to this, she was on cardizem. No prior AADs. She was followed by Dr. Johney Frame, last saw 03/2022. She had a TIA in 2021  Was in sinus rhythm at home. She has [employer]. Son talking with Dr. Johney Frame. She went to afib clinic today and has rate controlled afib.  She reports ongoing SOB  She denies angina, lower extremity edema, PND or orthopnea.    Interval hx 10/2 She had a fall in March with R distal tib-fib fracture. She underwent ORIF. No worsening SOB.  He denies angina, dyspnea on exertion, lower extremity edema, PND or orthopnea. She has not had any recurring falls.   Past Medical History:  Diagnosis Date   Arthritis    Atrial fibrillation, transient (HCC)    post op hip surgery; May 2021   Diverticulitis    Dyspnea    with exertion    GERD (gastroesophageal reflux disease)    Hyperlipidemia    Hypertension    Stroke (HCC)    hx opf mini strokes    TIA (transient ischemic attack)     Past Surgical History:  Procedure Laterality Date   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/13/2020   Procedure: CONVERSION TO LEFT TOTAL  HIP-POSTERIOR APPROACH;  Surgeon: Durene Romans, MD;  Location: [facility];  Service: Orthopedics;  Laterality: Left;  90 mins   HIP FRACTURE SURGERY Left 04/16/2020   TIBIA IM NAIL INSERTION Right 01/15/2023   Procedure: INTRAMEDULLARY (IM) NAIL  RIGHT TIBIAL;  Surgeon: Myrene Galas, MD;  Location: [facility];  Service: Orthopedics;  Laterality: Right;    Current Medications: Current Meds  Medication Sig   acetaminophen (TYLENOL) 500 MG tablet Take 2 tablets (1,000 mg total) by mouth every 8 (eight) hours. (Patient taking differently: Take 1,000 mg by mouth as needed.)   albuterol (VENTOLIN HFA) 108 (90 Base) MCG/ACT inhaler Inhale 2 puffs into the lungs every 4 (four) hours as needed for wheezing or shortness of breath.   Ascorbic Acid (VITAMIN C) 1000 MG tablet Take 1,000 mg by mouth daily.   cetirizine (ZYRTEC) 10 MG tablet Take 10 mg by mouth daily.    Cholecalciferol (VITAMIN D3) 125 MCG (5000 UT) TABS Take 5,000 Units by mouth daily.   cyanocobalamin 1000 MCG tablet Take 1 tablet by mouth every other day. Vitamin B12   diltiazem (CARDIZEM CD) 240 MG 24 hr capsule Take 1 capsule (240 mg total) by mouth daily.   docusate sodium (COLACE) 100 MG capsule Take 1 capsule (100 mg total) by mouth 2 (two)  times daily.   ELIQUIS 5 MG TABS tablet TAKE 1 TABLET BY MOUTH TWICE A DAY   gabapentin (NEURONTIN) 300 MG capsule Take 300 mg by mouth See admin instructions. Take one capsule (300 mg) in the morning and two capsules (600 mg) in the evening.   HYDROcodone-acetaminophen (NORCO/VICODIN) 5-325 MG tablet Take 1-2 tablets by mouth every 8 (eight) hours as needed for moderate pain.   ipratropium-albuterol (DUONEB) 0.5-2.5 (3) MG/3ML SOLN Take 3 mLs by nebulization every 6 (six) hours as needed (shortness of breath).   labetalol (NORMODYNE) 200 MG tablet Take 200 mg by mouth 2 (two) times daily.   lactulose (CHRONULAC) 10 GM/15ML solution Take 15 mLs (10 g total) by mouth 2 (two) times daily.   Metamucil  Fiber CHEW Chew 1 tablet by mouth daily. After supper   methocarbamol (ROBAXIN) 750 MG tablet Take 750 mg by mouth 3 (three) times daily as needed for muscle spasms. 1 tablet in am and 2 tablets in pm   mirtazapine (REMERON) 15 MG tablet Take 15 mg by mouth at bedtime.   Multiple Vitamin (MULTIVITAMIN WITH MINERALS) TABS tablet Take 1 tablet by mouth daily.   Multiple Vitamins-Minerals (PRESERVISION AREDS 2 PO) Take 1 tablet by mouth 2 (two) times daily. Vision Shield   Omega-3 Fatty Acids (OMEGA-3 FISH OIL PO) Take 2 capsules by mouth daily.   pantoprazole (PROTONIX) 40 MG tablet Take 40 mg by mouth daily.   pravastatin (PRAVACHOL) 20 MG tablet Take 20 mg by mouth at bedtime.   spironolactone (ALDACTONE) 25 MG tablet Take 25 mg by mouth daily.    sucralfate (CARAFATE) 1 g tablet Take 1 g by mouth with breakfast, with lunch, and with evening meal.     Allergies:   Keflex [cephalexin] and Morphine   Social History   Socioeconomic History   Marital status: Widowed    Spouse name: Not on file   Number of children: Not on file   Years of education: Not on file   Highest education level: Not on file  Occupational History   Not on file  Tobacco Use   Smoking status: Never   Smokeless tobacco: Never  Substance and Sexual Activity   Alcohol use: Never   Drug use: Never   Sexual activity: Not on file  Other Topics Concern   Not on file  Social History Narrative   Lives in [place] but currently staying with daughter in [place].   homemaker   Social Determinants of Health   Financial Resource Strain: Not on file  Food Insecurity: No Food Insecurity (11/25/2022)   Hunger Vital Sign    Worried About Running Out of Food in the Last Year: Never true    Ran Out of Food in the Last Year: Never true  Transportation Needs: No Transportation Needs (11/25/2022)   PRAPARE - Administrator, Civil Service (Medical): No    Lack of Transportation (Non-Medical): No  Physical Activity:  Not on file  Stress: Not on file  Social Connections: Not on file     Family History: The patient's family history includes Diabetes in her mother.  ROS:   Please see the history of present illness.     All other systems reviewed and are negative.  EKGs/Labs/Other Studies Reviewed:    The following studies were reviewed today:   EKG:  EKG is  ordered today.  The ekg ordered today demonstrates - in afib clinic  Cardiology Studies: 09/27/2022-TTE EF 60-65%, RVSP 45 mmHg, trivial  MR Cardiac monitor September-October: sinus rhythm, no afib 08/02/2020- TTE EF 55-60%, RVSP , negative bubble study  EKG Interpretation Date/Time:  Wednesday August 21 2023 16:26:19 EDT Ventricular Rate:  64 PR Interval:  168 QRS Duration:  92 QT Interval:  416 QTC Calculation: 429 R Axis:   -2  Text Interpretation: Normal sinus rhythm Normal ECG When compared with ECG of 13-Jan-2023 19:30, PREVIOUS ECG IS PRESENT Confirmed by Carolan Clines ([id number]) on 08/21/2023 4:27:51 PM   Recent Labs: 11/24/2022: B Natriuretic Peptide 390.2 11/25/2022: TSH 0.666 01/19/2023: ALT 10; BUN 15; Creatinine, Ser 0.91; Hemoglobin 9.9; Magnesium 1.8; Platelets 226; Potassium 4.4; Sodium 137  Recent Lipid Panel    Component Value Date/Time   CHOL 211 (H) 07/08/2020 1130   TRIG 198 (H) 07/08/2020 1130   HDL 38 (L) 07/08/2020 1130   CHOLHDL 5.6 (H) 07/08/2020 1130   LDLCALC 137 (H) 07/08/2020 1130     Risk Assessment/Calculations:    CHA2DS2-VASc Score = 8   This indicates a 10.8% annual risk of stroke. The patient's score is based upon: CHF History: 1 HTN History: 1 Diabetes History: 0 Stroke History: 2 (TIA) Vascular Disease History: 1 (aortic and coronary atherosclerosis noted on prior CT) Age Score: 2 Gender Score: 1     Physical Exam:    VS:   Vitals:   08/21/23 1622  BP: (!) 148/69  Pulse: 63  SpO2: 95%     Wt Readings from Last 3 Encounters:  01/19/23 194 lb 3.6 oz (88.1 kg)  12/18/22 188  lb 9.6 oz (85.5 kg)  12/18/22 188 lb 3.2 oz (85.4 kg)     GEN: Elderly, Well nourished, well developed in no acute distress HEENT: Normal NECK: No JVD LYMPHATICS: No lymphadenopathy CARDIAC: IRRR, no murmurs, rubs, gallops RESPIRATORY:  Clear to auscultation without rales, wheezing or rhonchi  ABDOMEN: Soft, non-tender, non-distended MUSCULOSKELETAL:  No edema; No deformity  SKIN: Warm and dry NEUROLOGIC:  Alert and oriented x 3 PSYCHIATRIC:  Normal affect   ASSESSMENT:    Paroxysmal Afib: had recurrence with recent PNA. Started amidoarone. TSH normal 11/25/2022. Normal LFTs. I discussed DCCV with her son. Her son prefers sticking with cardizem for now. Can reconsider if develops RVR - stopped amiodarone 200 mg daily ( no ILD); can continue off for now, if goes back into Afib can plan to restart - cont. diltiazem 240 mg daily , on BB - continue eliquis 5 mg BID  HTN: on labetalol 200 mg BID. Continue spironolactone 25 mg daily PLAN:    In order of problems listed above:  Follow up 6 months     Medication Adjustments/Labs and Tests Ordered: Current medicines are reviewed at length with the patient today.  Concerns regarding medicines are outlined above.  Orders Placed This Encounter  Procedures   EKG 12-Lead   No orders of the defined types were placed in this encounter.   There are no Patient Instructions on file for this visit.   Signed, Maisie Fus, MD  08/21/2023 4:22 PM    [facility]

## 2023-11-25 ENCOUNTER — Encounter: Payer: Self-pay | Admitting: Internal Medicine

## 2023-11-25 ENCOUNTER — Ambulatory Visit: Payer: Medicare Other | Attending: Internal Medicine | Admitting: Internal Medicine

## 2023-11-25 VITALS — BP 128/78 | HR 78 | Ht 64.0 in | Wt 202.6 lb

## 2023-11-25 DIAGNOSIS — I10 Essential (primary) hypertension: Secondary | ICD-10-CM | POA: Insufficient documentation

## 2023-11-25 DIAGNOSIS — I480 Paroxysmal atrial fibrillation: Secondary | ICD-10-CM | POA: Insufficient documentation

## 2023-11-25 NOTE — Progress Notes (Signed)
 Cardiology Office Note:    Date:  11/25/2023   ID:  [name], DOB 15-Apr-1933, MRN [id number]  PCP:  Silver Lamar LABOR, MD   [facility] Providers Cardiologist:  Alvan Ronal BRAVO, MD     Referring MD: Silver Lamar LABOR, MD   No chief complaint on file. Afib  History of Present Illness:    [name] is a 89 y.o. female with a hx of paroxysmal afib 03/2020, TIA 06/2020, HTN, COPD, HLD referral for hospital FU  She was admitted on 11/24/2022 - 1/16 for SOB.She was having flulike symptoms for a week and was tested positive for influenza B as an outpatient.  Chest x-ray had shown right upper lobe infiltrate was prescribed antibiotic but she had progressive worsening shortness of breath, cough.  On presentation, she was in A-fib with RVR. I saw her in the hospital. Started amiodarone .  She was in sinus rhythm until she had PNA per her son.  Prior to this, she was on cardizem . No prior AADs. She was followed by Dr. Kelsie, last saw 03/2022. She had a TIA in 2021  Was in sinus rhythm at home. She has [employer]. Son talking with Dr. Kelsie. She went to afib clinic today and has rate controlled afib.  She reports ongoing SOB  She denies angina, lower extremity edema, PND or orthopnea.   Interval hx 10/2 She had a fall in March with R distal tib-fib fracture. She underwent ORIF. No worsening SOB.  He denies angina, dyspnea on exertion, lower extremity edema, PND or orthopnea. She has not had any recurring falls.   Interval hx 11/25/2023 SOB since [place]. No PND or orthopnea. No LE edema. She has hx of COPD.  She has had off and on aifb with palpitations and associated shortness of breath.  She has taken her Eliquis  consistently.  Past Medical History:  Diagnosis Date   Arthritis    Atrial fibrillation, transient (HCC)    post op hip surgery; May 2021   Diverticulitis    Dyspnea    with exertion    GERD (gastroesophageal reflux disease)    Hyperlipidemia     Hypertension    Stroke (HCC)    hx opf mini strokes    TIA (transient ischemic attack)     Past Surgical History:  Procedure Laterality Date   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/13/2020   Procedure: CONVERSION TO LEFT TOTAL HIP-POSTERIOR APPROACH;  Surgeon: Ernie Cough, MD;  Location: [facility];  Service: Orthopedics;  Laterality: Left;  90 mins   HIP FRACTURE SURGERY Left 04/16/2020   TIBIA IM NAIL INSERTION Right 01/15/2023   Procedure: INTRAMEDULLARY (IM) NAIL  RIGHT TIBIAL;  Surgeon: Celena Sharper, MD;  Location: [facility];  Service: Orthopedics;  Laterality: Right;    Current Medications: Current Meds  Medication Sig   acetaminophen  (TYLENOL ) 500 MG tablet Take 2 tablets (1,000 mg total) by mouth every 8 (eight) hours. (Patient taking differently: Take 1,000 mg by mouth as needed.)   albuterol  (VENTOLIN  HFA) 108 (90 Base) MCG/ACT inhaler Inhale 2 puffs into the lungs every 4 (four) hours as needed for wheezing or shortness of breath.   Ascorbic Acid  (VITAMIN C ) 1000 MG tablet Take 1,000 mg by mouth daily.   cetirizine (ZYRTEC) 10 MG tablet Take 10 mg by mouth daily.    Cholecalciferol  (VITAMIN D3) 125 MCG (5000 UT) TABS Take 5,000 Units by mouth daily.   cyanocobalamin  1000 MCG tablet Take 1 tablet by mouth every other  day. Vitamin B12   diltiazem  (CARDIZEM  CD) 240 MG 24 hr capsule Take 1 capsule (240 mg total) by mouth daily.   diltiazem  (CARDIZEM ) 30 MG tablet Take 30 mg by mouth as needed.   ELIQUIS  5 MG TABS tablet TAKE 1 TABLET BY MOUTH TWICE A DAY   gabapentin  (NEURONTIN ) 300 MG capsule Take 300 mg by mouth See admin instructions. Take one capsule (300 mg) in the morning and two capsules (600 mg) in the evening.   ipratropium-albuterol  (DUONEB) 0.5-2.5 (3) MG/3ML SOLN Take 3 mLs by nebulization every 6 (six) hours as needed (shortness of breath).   labetalol  (NORMODYNE ) 200 MG tablet Take 200 mg by mouth 2 (two) times daily.   lactulose  (CHRONULAC ) 10 GM/15ML solution Take 15 mLs (10  g total) by mouth 2 (two) times daily.   methocarbamol  (ROBAXIN ) 750 MG tablet Take 750 mg by mouth 3 (three) times daily as needed for muscle spasms. 1 tablet in am and 2 tablets in pm   mirtazapine  (REMERON ) 15 MG tablet Take 15 mg by mouth at bedtime.   Multiple Vitamin (MULTIVITAMIN WITH MINERALS) TABS tablet Take 1 tablet by mouth daily.   Multiple Vitamins-Minerals (PRESERVISION AREDS 2 PO) Take 1 tablet by mouth 2 (two) times daily. Vision Shield   Omega-3 Fatty Acids (OMEGA-3 FISH OIL PO) Take 2 capsules by mouth daily.   pantoprazole  (PROTONIX ) 40 MG tablet Take 40 mg by mouth daily.   pravastatin  (PRAVACHOL ) 20 MG tablet Take 20 mg by mouth at bedtime.   spironolactone  (ALDACTONE ) 25 MG tablet Take 25 mg by mouth daily.    sucralfate  (CARAFATE ) 1 g tablet Take 1 g by mouth with breakfast, with lunch, and with evening meal.     Allergies:   Keflex [cephalexin] and Morphine    Social History   Socioeconomic History   Marital status: Widowed    Spouse name: Not on file   Number of children: Not on file   Years of education: Not on file   Highest education level: Not on file  Occupational History   Not on file  Tobacco Use   Smoking status: Never   Smokeless tobacco: Never  Substance and Sexual Activity   Alcohol  use: Never   Drug use: Never   Sexual activity: Not on file  Other Topics Concern   Not on file  Social History Narrative   Lives in [place]  but currently staying with daughter in [place].   homemaker   Social Drivers of Health   Financial Resource Strain: Not on file  Food Insecurity: No Food Insecurity (11/25/2022)   Hunger Vital Sign    Worried About Running Out of Food in the Last Year: Never true    Ran Out of Food in the Last Year: Never true  Transportation Needs: No Transportation Needs (11/25/2022)   PRAPARE - Administrator, Civil Service (Medical): No    Lack of Transportation (Non-Medical): No  Physical Activity: Not on file   Stress: Not on file  Social Connections: Not on file     Family History: The patient's family history includes Diabetes in her mother.  ROS:   Please see the history of present illness.     All other systems reviewed and are negative.  EKGs/Labs/Other Studies Reviewed:    The following studies were reviewed today:   EKG:  EKG is  ordered today.  The ekg ordered today demonstrates - in afib clinic  Cardiology Studies: 09/27/2022-TTE EF 60-65%, RVSP 45 mmHg, trivial  MR Cardiac monitor September-October: sinus rhythm, no afib 08/02/2020- TTE EF 55-60%, RVSP , negative bubble study  EKG Interpretation Date/Time:  Monday November 25 2023 15:08:04 EST Ventricular Rate:  78 PR Interval:    QRS Duration:  96 QT Interval:  376 QTC Calculation: 428 R Axis:   1  Text Interpretation: Atrial fibrillation When compared with ECG of 21-Aug-2023 16:26, Atrial fibrillation has replaced Sinus rhythm Confirmed by Alvan Shuck ([id number]) on 11/25/2023 3:16:29 PM   Recent Labs: 01/19/2023: ALT 10; BUN 15; Creatinine, Ser 0.91; Hemoglobin 9.9; Magnesium  1.8; Platelets 226; Potassium 4.4; Sodium 137  Recent Lipid Panel    Component Value Date/Time   CHOL 211 (H) 07/08/2020 1130   TRIG 198 (H) 07/08/2020 1130   HDL 38 (L) 07/08/2020 1130   CHOLHDL 5.6 (H) 07/08/2020 1130   LDLCALC 137 (H) 07/08/2020 1130     Risk Assessment/Calculations:    CHA2DS2-VASc Score = 8   This indicates a 10.8% annual risk of stroke. The patient's score is based upon: CHF History: 1 HTN History: 1 Diabetes History: 0 Stroke History: 2 (TIA) Vascular Disease History: 1 (aortic and coronary atherosclerosis noted on prior CT) Age Score: 2 Gender Score: 1     Physical Exam:    VS:   Vitals:   11/25/23 1456  BP: 128/78  Pulse: 78     Wt Readings from Last 3 Encounters:  11/25/23 202 lb 9.6 oz (91.9 kg)  08/21/23 199 lb 12.8 oz (90.6 kg)  01/19/23 194 lb 3.6 oz (88.1 kg)     GEN: Elderly, Well  nourished, well developed in no acute distress HEENT: Normal NECK: No JVD LYMPHATICS: No lymphadenopathy CARDIAC: IRRR, no murmurs, rubs, gallops RESPIRATORY:  Clear to auscultation without rales, wheezing or rhonchi  ABDOMEN: Soft, non-tender, non-distended MUSCULOSKELETAL:  No edema; No deformity  SKIN: Warm and dry NEUROLOGIC:  Alert and oriented x 3 PSYCHIATRIC:  Normal affect   ASSESSMENT:    Paroxysmal Afib: had recurrence with recent PNA. Started amidoarone. TSH normal 11/25/2022. Normal LFTs. COPD hx. -Considering she is having significant shortness of breath and concern for RVR; will plan for a cardioversion on January 9th. No signs of CHF. - stopped amiodarone  200 mg daily in the past ; will consider to restart as a last resort. - cont. diltiazem  240 mg daily , on BB; also continue dilt 30mg  PRN. - continue eliquis  5 mg BID  HTN: on labetalol  200 mg BID. Continue spironolactone  25 mg daily PLAN:    In order of problems listed above:  Follow up in 1 month with me     Medication Adjustments/Labs and Tests Ordered: Current medicines are reviewed at length with the patient today.  Concerns regarding medicines are outlined above.  Orders Placed This Encounter  Procedures   EKG 12-Lead   No orders of the defined types were placed in this encounter.   There are no Patient Instructions on file for this visit.   Signed, Alvan Shuck BRAVO, MD  11/25/2023 3:16 PM    [facility]

## 2023-11-25 NOTE — Patient Instructions (Addendum)
 Medication Instructions:  Your physician recommends that you continue on your current medications as directed. Please refer to the Current Medication list given to you today.  *If you need a refill on your cardiac medications before your next appointment, please call your pharmacy*  Lab Work: BMET and CBC and Magnesium  today If you have labs (blood work) drawn today and your tests are completely normal, you will receive your results only by: MyChart Message (if you have MyChart) OR A paper copy in the mail If you have any lab test that is abnormal or we need to change your treatment, we will call you to review the results.  Testing/Procedures: Your physician has recommended that you have a Cardioversion (DCCV). Electrical Cardioversion uses a jolt of electricity to your heart either through paddles or wired patches attached to your chest. This is a controlled, usually prescheduled, procedure. Defibrillation is done under light anesthesia in the hospital, and you usually go home the day of the procedure. This is done to get your heart back into a normal rhythm. You are not awake for the procedure. Please see the instruction sheet given to you today.      Dear [name]  You are scheduled for a Cardioversion on Thursday, January 9 with Dr. Ronal Ross.  Please arrive at the [facility] (Main Entrance A) at [facility]: [address] [place], [place] [address] at 8:30 AM (This time is 1 hour(s) before your procedure to ensure your preparation).   Free valet parking service is available. You will check in at ADMITTING.   *Please Note: You will receive a call the day before your procedure to confirm the appointment time. That time may have changed from the original time based on the schedule for that day.*    DIET:  Nothing to eat or drink after midnight except a sip of water  with medications (see medication instructions below)  MEDICATION INSTRUCTIONS: !!IF ANY NEW MEDICATIONS ARE  STARTED AFTER TODAY, PLEASE NOTIFY YOUR PROVIDER AS SOON AS POSSIBLE!!  FYI: Medications such as Semaglutide (Ozempic, Wegovy), Tirzepatide (Mounjaro, Zepbound), Dulaglutide (Trulicity), etc (GLP1 agonists) AND Canagliflozin (Invokana), Dapagliflozin (Farxiga), Empagliflozin (Jardiance), Ertugliflozin (Steglatro), Bexagliflozin [employer]) or any combination with one of these drugs such as Invokamet (Canagliflozin/Metformin), Synjardy (Empagliflozin/Metformin), etc (SGLT2 inhibitors) must be held around the time of a procedure. This is not a comprehensive list of all of these drugs. Please review all of your medications and talk to your provider if you take any one of these. If you are not sure, ask your provider.    Continue taking your anticoagulant (blood thinner): Apixaban  (Eliquis ).  You will need to continue this after your procedure until you are told by your provider that it is safe to stop.     FYI:  For your safety, and to allow us  to monitor your vital signs accurately during the surgery/procedure we request: If you have artificial nails, gel coating, SNS etc, please have those removed prior to your surgery/procedure. Not having the nail coverings /polish removed may result in cancellation or delay of your surgery/procedure.  Your support person will be asked to wait in the waiting room during your procedure.  It is OK to have someone drop you off and come back when you are ready to be discharged.  You cannot drive after the procedure and will need someone to drive you home.  Bring your insurance cards.  *Special Note: Every effort is made to have your procedure done on time. Occasionally  there are emergencies that occur at the hospital that may cause delays. Please be patient if a delay does occur.       Follow-Up: At [facility], you and your health needs are our priority.  As part of our continuing mission to provide you with exceptional heart care, we have created  designated Provider Care Teams.  These Care Teams include your primary Cardiologist (physician) and Advanced Practice Providers (APPs -  Physician Assistants and Nurse Practitioners) who all work together to provide you with the care you need, when you need it.   Your next appointment:   1 month(s)  Provider:   Alvan Ronal BRAVO, MD

## 2023-11-25 NOTE — H&P (View-Only) (Signed)
 Cardiology Office Note:    Date:  11/25/2023   ID:  [name], DOB 15-Apr-1933, MRN [id number]  PCP:  Silver Lamar LABOR, MD   [facility] Providers Cardiologist:  Alvan Ronal BRAVO, MD     Referring MD: Silver Lamar LABOR, MD   No chief complaint on file. Afib  History of Present Illness:    [name] is a 89 y.o. female with a hx of paroxysmal afib 03/2020, TIA 06/2020, HTN, COPD, HLD referral for hospital FU  She was admitted on 11/24/2022 - 1/16 for SOB.She was having flulike symptoms for a week and was tested positive for influenza B as an outpatient.  Chest x-ray had shown right upper lobe infiltrate was prescribed antibiotic but she had progressive worsening shortness of breath, cough.  On presentation, she was in A-fib with RVR. I saw her in the hospital. Started amiodarone .  She was in sinus rhythm until she had PNA per her son.  Prior to this, she was on cardizem . No prior AADs. She was followed by Dr. Kelsie, last saw 03/2022. She had a TIA in 2021  Was in sinus rhythm at home. She has [employer]. Son talking with Dr. Kelsie. She went to afib clinic today and has rate controlled afib.  She reports ongoing SOB  She denies angina, lower extremity edema, PND or orthopnea.   Interval hx 10/2 She had a fall in March with R distal tib-fib fracture. She underwent ORIF. No worsening SOB.  He denies angina, dyspnea on exertion, lower extremity edema, PND or orthopnea. She has not had any recurring falls.   Interval hx 11/25/2023 SOB since [place]. No PND or orthopnea. No LE edema. She has hx of COPD.  She has had off and on aifb with palpitations and associated shortness of breath.  She has taken her Eliquis  consistently.  Past Medical History:  Diagnosis Date   Arthritis    Atrial fibrillation, transient (HCC)    post op hip surgery; May 2021   Diverticulitis    Dyspnea    with exertion    GERD (gastroesophageal reflux disease)    Hyperlipidemia     Hypertension    Stroke (HCC)    hx opf mini strokes    TIA (transient ischemic attack)     Past Surgical History:  Procedure Laterality Date   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/13/2020   Procedure: CONVERSION TO LEFT TOTAL HIP-POSTERIOR APPROACH;  Surgeon: Ernie Cough, MD;  Location: [facility];  Service: Orthopedics;  Laterality: Left;  90 mins   HIP FRACTURE SURGERY Left 04/16/2020   TIBIA IM NAIL INSERTION Right 01/15/2023   Procedure: INTRAMEDULLARY (IM) NAIL  RIGHT TIBIAL;  Surgeon: Celena Sharper, MD;  Location: [facility];  Service: Orthopedics;  Laterality: Right;    Current Medications: Current Meds  Medication Sig   acetaminophen  (TYLENOL ) 500 MG tablet Take 2 tablets (1,000 mg total) by mouth every 8 (eight) hours. (Patient taking differently: Take 1,000 mg by mouth as needed.)   albuterol  (VENTOLIN  HFA) 108 (90 Base) MCG/ACT inhaler Inhale 2 puffs into the lungs every 4 (four) hours as needed for wheezing or shortness of breath.   Ascorbic Acid  (VITAMIN C ) 1000 MG tablet Take 1,000 mg by mouth daily.   cetirizine (ZYRTEC) 10 MG tablet Take 10 mg by mouth daily.    Cholecalciferol  (VITAMIN D3) 125 MCG (5000 UT) TABS Take 5,000 Units by mouth daily.   cyanocobalamin  1000 MCG tablet Take 1 tablet by mouth every other  day. Vitamin B12   diltiazem  (CARDIZEM  CD) 240 MG 24 hr capsule Take 1 capsule (240 mg total) by mouth daily.   diltiazem  (CARDIZEM ) 30 MG tablet Take 30 mg by mouth as needed.   ELIQUIS  5 MG TABS tablet TAKE 1 TABLET BY MOUTH TWICE A DAY   gabapentin  (NEURONTIN ) 300 MG capsule Take 300 mg by mouth See admin instructions. Take one capsule (300 mg) in the morning and two capsules (600 mg) in the evening.   ipratropium-albuterol  (DUONEB) 0.5-2.5 (3) MG/3ML SOLN Take 3 mLs by nebulization every 6 (six) hours as needed (shortness of breath).   labetalol  (NORMODYNE ) 200 MG tablet Take 200 mg by mouth 2 (two) times daily.   lactulose  (CHRONULAC ) 10 GM/15ML solution Take 15 mLs (10  g total) by mouth 2 (two) times daily.   methocarbamol  (ROBAXIN ) 750 MG tablet Take 750 mg by mouth 3 (three) times daily as needed for muscle spasms. 1 tablet in am and 2 tablets in pm   mirtazapine  (REMERON ) 15 MG tablet Take 15 mg by mouth at bedtime.   Multiple Vitamin (MULTIVITAMIN WITH MINERALS) TABS tablet Take 1 tablet by mouth daily.   Multiple Vitamins-Minerals (PRESERVISION AREDS 2 PO) Take 1 tablet by mouth 2 (two) times daily. Vision Shield   Omega-3 Fatty Acids (OMEGA-3 FISH OIL PO) Take 2 capsules by mouth daily.   pantoprazole  (PROTONIX ) 40 MG tablet Take 40 mg by mouth daily.   pravastatin  (PRAVACHOL ) 20 MG tablet Take 20 mg by mouth at bedtime.   spironolactone  (ALDACTONE ) 25 MG tablet Take 25 mg by mouth daily.    sucralfate  (CARAFATE ) 1 g tablet Take 1 g by mouth with breakfast, with lunch, and with evening meal.     Allergies:   Keflex [cephalexin] and Morphine    Social History   Socioeconomic History   Marital status: Widowed    Spouse name: Not on file   Number of children: Not on file   Years of education: Not on file   Highest education level: Not on file  Occupational History   Not on file  Tobacco Use   Smoking status: Never   Smokeless tobacco: Never  Substance and Sexual Activity   Alcohol  use: Never   Drug use: Never   Sexual activity: Not on file  Other Topics Concern   Not on file  Social History Narrative   Lives in [place]  but currently staying with daughter in [place].   homemaker   Social Drivers of Health   Financial Resource Strain: Not on file  Food Insecurity: No Food Insecurity (11/25/2022)   Hunger Vital Sign    Worried About Running Out of Food in the Last Year: Never true    Ran Out of Food in the Last Year: Never true  Transportation Needs: No Transportation Needs (11/25/2022)   PRAPARE - Administrator, Civil Service (Medical): No    Lack of Transportation (Non-Medical): No  Physical Activity: Not on file   Stress: Not on file  Social Connections: Not on file     Family History: The patient's family history includes Diabetes in her mother.  ROS:   Please see the history of present illness.     All other systems reviewed and are negative.  EKGs/Labs/Other Studies Reviewed:    The following studies were reviewed today:   EKG:  EKG is  ordered today.  The ekg ordered today demonstrates - in afib clinic  Cardiology Studies: 09/27/2022-TTE EF 60-65%, RVSP 45 mmHg, trivial  MR Cardiac monitor September-October: sinus rhythm, no afib 08/02/2020- TTE EF 55-60%, RVSP , negative bubble study  EKG Interpretation Date/Time:  Monday November 25 2023 15:08:04 EST Ventricular Rate:  78 PR Interval:    QRS Duration:  96 QT Interval:  376 QTC Calculation: 428 R Axis:   1  Text Interpretation: Atrial fibrillation When compared with ECG of 21-Aug-2023 16:26, Atrial fibrillation has replaced Sinus rhythm Confirmed by Alvan Shuck ([id number]) on 11/25/2023 3:16:29 PM   Recent Labs: 01/19/2023: ALT 10; BUN 15; Creatinine, Ser 0.91; Hemoglobin 9.9; Magnesium  1.8; Platelets 226; Potassium 4.4; Sodium 137  Recent Lipid Panel    Component Value Date/Time   CHOL 211 (H) 07/08/2020 1130   TRIG 198 (H) 07/08/2020 1130   HDL 38 (L) 07/08/2020 1130   CHOLHDL 5.6 (H) 07/08/2020 1130   LDLCALC 137 (H) 07/08/2020 1130     Risk Assessment/Calculations:    CHA2DS2-VASc Score = 8   This indicates a 10.8% annual risk of stroke. The patient's score is based upon: CHF History: 1 HTN History: 1 Diabetes History: 0 Stroke History: 2 (TIA) Vascular Disease History: 1 (aortic and coronary atherosclerosis noted on prior CT) Age Score: 2 Gender Score: 1     Physical Exam:    VS:   Vitals:   11/25/23 1456  BP: 128/78  Pulse: 78     Wt Readings from Last 3 Encounters:  11/25/23 202 lb 9.6 oz (91.9 kg)  08/21/23 199 lb 12.8 oz (90.6 kg)  01/19/23 194 lb 3.6 oz (88.1 kg)     GEN: Elderly, Well  nourished, well developed in no acute distress HEENT: Normal NECK: No JVD LYMPHATICS: No lymphadenopathy CARDIAC: IRRR, no murmurs, rubs, gallops RESPIRATORY:  Clear to auscultation without rales, wheezing or rhonchi  ABDOMEN: Soft, non-tender, non-distended MUSCULOSKELETAL:  No edema; No deformity  SKIN: Warm and dry NEUROLOGIC:  Alert and oriented x 3 PSYCHIATRIC:  Normal affect   ASSESSMENT:    Paroxysmal Afib: had recurrence with recent PNA. Started amidoarone. TSH normal 11/25/2022. Normal LFTs. COPD hx. -Considering she is having significant shortness of breath and concern for RVR; will plan for a cardioversion on January 9th. No signs of CHF. - stopped amiodarone  200 mg daily in the past ; will consider to restart as a last resort. - cont. diltiazem  240 mg daily , on BB; also continue dilt 30mg  PRN. - continue eliquis  5 mg BID  HTN: on labetalol  200 mg BID. Continue spironolactone  25 mg daily PLAN:    In order of problems listed above:  Follow up in 1 month with me     Medication Adjustments/Labs and Tests Ordered: Current medicines are reviewed at length with the patient today.  Concerns regarding medicines are outlined above.  Orders Placed This Encounter  Procedures   EKG 12-Lead   No orders of the defined types were placed in this encounter.   There are no Patient Instructions on file for this visit.   Signed, Alvan Shuck BRAVO, MD  11/25/2023 3:16 PM    [facility]

## 2023-11-26 ENCOUNTER — Telehealth: Payer: Self-pay | Admitting: Cardiology

## 2023-11-26 DIAGNOSIS — E875 Hyperkalemia: Secondary | ICD-10-CM

## 2023-11-26 LAB — BASIC METABOLIC PANEL
BUN/Creatinine Ratio: 22 (ref 12–28)
BUN/Creatinine Ratio: 20 (ref 12–28)
BUN: 19 mg/dL (ref 8–27)
BUN: 17 mg/dL (ref 8–27)
CO2: 23 mmol/L (ref 20–29)
CO2: 20 mmol/L (ref 20–29)
Calcium: 9.5 mg/dL (ref 8.7–10.3)
Calcium: 9.5 mg/dL (ref 8.7–10.3)
Chloride: 101 mmol/L (ref 96–106)
Chloride: 103 mmol/L (ref 96–106)
Creatinine, Ser: 0.88 mg/dL (ref 0.57–1.00)
Creatinine, Ser: 0.84 mg/dL (ref 0.57–1.00)
Glucose: 94 mg/dL (ref 70–99)
Glucose: 70 mg/dL (ref 70–99)
Potassium: 5.8 mmol/L (ref 3.5–5.2)
Potassium: 4.9 mmol/L (ref 3.5–5.2)
Sodium: 141 mmol/L (ref 134–144)
Sodium: 138 mmol/L (ref 134–144)
eGFR: 63 mL/min/{1.73_m2} (ref >59)
eGFR: 67 mL/min/{1.73_m2} (ref >59)

## 2023-11-26 LAB — CBC
Hematocrit: 39.6 % (ref 34.0–46.6)
Hemoglobin: 13 g/dL (ref 11.1–15.9)
MCH: 28.8 pg (ref 26.6–33.0)
MCHC: 32.8 g/dL (ref 31.5–35.7)
MCV: 88 fL (ref 79–97)
Platelets: 256 10*3/uL (ref 150–450)
RBC: 4.52 x10E6/uL (ref 3.77–5.28)
RDW: 13.9 % (ref 11.7–15.4)
WBC: 8.3 10*3/uL (ref 3.4–10.8)

## 2023-11-26 LAB — MAGNESIUM: Magnesium: 1.9 mg/dL (ref 1.6–2.3)

## 2023-11-26 NOTE — Telephone Encounter (Signed)
 I received signout from our overnight fellow this morning, he reported that he had received a critical lab result on this patient.  Patient had a basic metabolic panel drawn yesterday that showed potassium elevated to 5.8.  Otherwise, BMP and magnesium  normal.  Patient is on spironolactone  25 mg daily, potassium has ranged from 4.4-4.7 for the past year.  Possible that potassium is elevated due to spironolactone  use, could possibly be lab error.  I called patient and discussed with her daughter, Bruna.  Pat informed me that patient lives with her.  I instructed them to not administer spironolactone  this morning.  Also asked that they go to the [employer] office to have labs redrawn.  Patient's daughter reported that she will take the patient to get labs drawn this morning. Lab orders placed   Routing to Dr. Alvan as an RICK as she had ordered the initial BMP  Rollo FABIENE Louder, PA-C 11/26/2023 7:31 AM

## 2023-11-27 ENCOUNTER — Other Ambulatory Visit: Payer: Self-pay | Admitting: Internal Medicine

## 2023-11-27 ENCOUNTER — Telehealth: Payer: Self-pay | Admitting: Internal Medicine

## 2023-11-27 NOTE — Progress Notes (Addendum)
 Spoke to pt's dtr Bruna and instructed them to come at 0815 and to be NPO after 0000.  Confirmed no missed doses of AC and instructed to take in AM with a small sip of water .  Confirmed that pt will have a ride home and someone to stay with them for 24 hours after the procedure. Instructed patient to not wear any jewelry or lotion.

## 2023-11-27 NOTE — Telephone Encounter (Signed)
 Called Mrs. Barich's daughter and discussed that her K is 4.9 which is improved. We discussed stopping her spironolactone and continuing to monitor her blood pressure. If well controlled, do not need to change her antihypertensive regimen.

## 2023-11-28 ENCOUNTER — Ambulatory Visit (HOSPITAL_COMMUNITY): Payer: Medicare Other | Admitting: Anesthesiology

## 2023-11-28 ENCOUNTER — Ambulatory Visit (HOSPITAL_COMMUNITY)
Admission: RE | Admit: 2023-11-28 | Discharge: 2023-11-28 | Disposition: A | Discharge status: Home / Self Care | Payer: Medicare Other | Attending: Internal Medicine | Admitting: Internal Medicine

## 2023-11-28 ENCOUNTER — Encounter (HOSPITAL_COMMUNITY)
Admission: RE | Disposition: A | Discharge status: Home / Self Care | Payer: Self-pay | Source: Home / Self Care | Attending: Internal Medicine

## 2023-11-28 DIAGNOSIS — I480 Paroxysmal atrial fibrillation: Secondary | ICD-10-CM | POA: Insufficient documentation

## 2023-11-28 DIAGNOSIS — Z8673 Personal history of transient ischemic attack (TIA), and cerebral infarction without residual deficits: Secondary | ICD-10-CM | POA: Insufficient documentation

## 2023-11-28 DIAGNOSIS — Z79899 Other long term (current) drug therapy: Secondary | ICD-10-CM | POA: Diagnosis not present

## 2023-11-28 DIAGNOSIS — J449 Chronic obstructive pulmonary disease, unspecified: Secondary | ICD-10-CM | POA: Insufficient documentation

## 2023-11-28 DIAGNOSIS — E785 Hyperlipidemia, unspecified: Secondary | ICD-10-CM | POA: Diagnosis not present

## 2023-11-28 DIAGNOSIS — Z539 Procedure and treatment not carried out, unspecified reason: Secondary | ICD-10-CM | POA: Insufficient documentation

## 2023-11-28 DIAGNOSIS — Z7901 Long term (current) use of anticoagulants: Secondary | ICD-10-CM | POA: Diagnosis not present

## 2023-11-28 DIAGNOSIS — I10 Essential (primary) hypertension: Secondary | ICD-10-CM | POA: Insufficient documentation

## 2023-11-28 SURGERY — Surgical Case

## 2023-11-28 NOTE — Plan of Care (Signed)
 Procedure was canceled she is in sinus rhythm

## 2023-11-28 NOTE — Interval H&P Note (Signed)
 History and Physical Interval Note:  11/28/2023 8:27 AM  [name]  has presented today for surgery, with the diagnosis of AFIB.  The various methods of treatment have been discussed with the patient and family. After consideration of risks, benefits and other options for treatment, the patient has consented to  Procedure(s): CARDIOVERSION (N/A) as a surgical intervention.  The patient's history has been reviewed, patient examined, no change in status, stable for surgery.  I have reviewed the patient's chart and labs.  Questions were answered to the patient's satisfaction.     Alvan Ronal BRAVO

## 2023-11-28 NOTE — Anesthesia Preprocedure Evaluation (Signed)
 Anesthesia Evaluation  Patient identified by MRN, date of birth, ID band Patient awake    Reviewed: Allergy & Precautions, NPO status , Patient's Chart, lab work & pertinent test results, reviewed documented beta blocker date and time   Airway Mallampati: II  TM Distance: >3 FB Neck ROM: Full    Dental  (+) Teeth Intact, Dental Advisory Given   Pulmonary COPD   Pulmonary exam normal breath sounds clear to auscultation       Cardiovascular hypertension, Pt. on medications and Pt. on home beta blockers + dysrhythmias Atrial Fibrillation  Rhythm:Irregular Rate:Abnormal     Neuro/Psych TIACVA    GI/Hepatic Neg liver ROS,GERD  Medicated,,  Endo/Other  Obesity   Renal/GU negative Renal ROS     Musculoskeletal  (+) Arthritis ,    Abdominal   Peds  Hematology  (+) Blood dyscrasia (Eliquis )   Anesthesia Other Findings Day of surgery medications reviewed with the patient.  Reproductive/Obstetrics                             Anesthesia Physical Anesthesia Plan  ASA: 3  Anesthesia Plan: General   Post-op Pain Management: Minimal or no pain anticipated   Induction: Intravenous  PONV Risk Score and Plan: 3 and Dexamethasone  and Ondansetron   Airway Management Planned: Mask and Natural Airway  Additional Equipment:   Intra-op Plan:   Post-operative Plan:   Informed Consent: I have reviewed the patients History and Physical, chart, labs and discussed the procedure including the risks, benefits and alternatives for the proposed anesthesia with the patient or authorized representative who has indicated his/her understanding and acceptance.     Dental advisory given  Plan Discussed with: CRNA  Anesthesia Plan Comments:        Anesthesia Quick Evaluation

## 2023-11-28 NOTE — Progress Notes (Signed)
 Patient noted to be in sinus rhythm when placed on cardiac monitor. EKG to confirm and Dr Wyline Mood notified. Procedure canceled per MD.

## 2023-12-26 NOTE — Progress Notes (Signed)
 Cardiology Office Note:    Date:  12/26/2023   ID:  [name], DOB 08-Jul-1933, MRN [id number]  PCP:  Silver Lamar LABOR, MD   [facility] Providers Cardiologist:  Alvan Ronal BRAVO, MD     Referring MD: Silver Lamar LABOR, MD   No chief complaint on file. Afib  History of Present Illness:    [name] is a 89 y.o. female with a hx of paroxysmal afib 03/2020, TIA 06/2020, HTN, COPD, HLD referral for hospital FU  She was admitted on 11/24/2022 - 1/16 for SOB.She was having flulike symptoms for a week and was tested positive for influenza B as an outpatient.  Chest x-ray had shown right upper lobe infiltrate was prescribed antibiotic but she had progressive worsening shortness of breath, cough.  On presentation, she was in A-fib with RVR. I saw her in the hospital. Started amiodarone .  She was in sinus rhythm until she had PNA per her son.  Prior to this, she was on cardizem . No prior AADs. She was followed by Dr. Kelsie, last saw 03/2022. She had a TIA in 2021  Was in sinus rhythm at home. She has [employer]. Son talking with Dr. Kelsie. She went to afib clinic today and has rate controlled afib.  She reports ongoing SOB  She denies angina, lower extremity edema, PND or orthopnea.   Interval hx 10/2 She had a fall in March with R distal tib-fib fracture. She underwent ORIF. No worsening SOB.  He denies angina, dyspnea on exertion, lower extremity edema, PND or orthopnea. She has not had any recurring falls.   Interval hx 11/25/2023 SOB since [place]. No PND or orthopnea. No LE edema. She has hx of COPD.  She has had off and on aifb with palpitations and associated shortness of breath.  She has taken her Eliquis  consistently.  Interim hx 12/27/2023 [name] was in A-fib and I opted to cardiovert her. She was in sinus rhythm post cardioversion. She is back in A-fib today.  She is very symptomatic.  She cannot walk to the bathroom without being short of breath.  She also  endorses PND/orthopnea.  She is trying her breathing treatments but they are not as helpful.  Past Medical History:  Diagnosis Date   Arthritis    Atrial fibrillation, transient (HCC)    post op hip surgery; May 2021   Diverticulitis    Dyspnea    with exertion    GERD (gastroesophageal reflux disease)    Hyperlipidemia    Hypertension    Stroke (HCC)    hx opf mini strokes    TIA (transient ischemic attack)     Past Surgical History:  Procedure Laterality Date   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/13/2020   Procedure: CONVERSION TO LEFT TOTAL HIP-POSTERIOR APPROACH;  Surgeon: Ernie Cough, MD;  Location: [facility];  Service: Orthopedics;  Laterality: Left;  90 mins   HIP FRACTURE SURGERY Left 04/16/2020   TIBIA IM NAIL INSERTION Right 01/15/2023   Procedure: INTRAMEDULLARY (IM) NAIL  RIGHT TIBIAL;  Surgeon: Celena Sharper, MD;  Location: [facility];  Service: Orthopedics;  Laterality: Right;    Current Medications: No outpatient medications have been marked as taking for the 12/27/23 encounter (Appointment) with Alvan Ronal BRAVO, MD.     Allergies:   Keflex [cephalexin] and Morphine    Social History   Socioeconomic History   Marital status: Widowed    Spouse name: Not on file   Number of children: Not on  file   Years of education: Not on file   Highest education level: Not on file  Occupational History   Not on file  Tobacco Use   Smoking status: Never   Smokeless tobacco: Never  Substance and Sexual Activity   Alcohol  use: Never   Drug use: Never   Sexual activity: Not on file  Other Topics Concern   Not on file  Social History Narrative   Lives in [place]  but currently staying with daughter in [place].   homemaker   Social Drivers of Health   Financial Resource Strain: Not on file  Food Insecurity: No Food Insecurity (11/25/2022)   Hunger Vital Sign    Worried About Running Out of Food in the Last Year: Never true    Ran Out of Food in the Last Year: Never true   Transportation Needs: No Transportation Needs (11/25/2022)   PRAPARE - Administrator, Civil Service (Medical): No    Lack of Transportation (Non-Medical): No  Physical Activity: Not on file  Stress: Not on file  Social Connections: Not on file     Family History: The patient's family history includes Diabetes in her mother.  ROS:   Please see the history of present illness.     All other systems reviewed and are negative.  EKGs/Labs/Other Studies Reviewed:    The following studies were reviewed today:   EKG:  EKG is  ordered today.  The ekg ordered today demonstrates - in afib clinic  Cardiology Studies: 09/27/2022-TTE EF 60-65%, RVSP 45 mmHg, trivial MR Cardiac monitor September-October: sinus rhythm, no afib 08/02/2020- TTE EF 55-60%, RVSP , negative bubble study  EKG Interpretation Date/Time:    Ventricular Rate:    PR Interval:    QRS Duration:    QT Interval:    QTC Calculation:   R Axis:      Text Interpretation:     Recent Labs: 01/19/2023: ALT 10 11/25/2023: Hemoglobin 13.0; Magnesium  1.9; Platelets 256 11/26/2023: BUN 17; Creatinine, Ser 0.84; Potassium 4.9; Sodium 138  Recent Lipid Panel    Component Value Date/Time   CHOL 211 (H) 07/08/2020 1130   TRIG 198 (H) 07/08/2020 1130   HDL 38 (L) 07/08/2020 1130   CHOLHDL 5.6 (H) 07/08/2020 1130   LDLCALC 137 (H) 07/08/2020 1130     Risk Assessment/Calculations:    CHA2DS2-VASc Score = 8   This indicates a 10.8% annual risk of stroke. The patient's score is based upon: CHF History: 1 HTN History: 1 Diabetes History: 0 Stroke History: 2 (TIA) Vascular Disease History: 1 (aortic and coronary atherosclerosis noted on prior CT) Age Score: 2 Gender Score: 1     Physical Exam:    VS:   Vitals:   12/27/23 0924  BP: 118/74       Wt Readings from Last 3 Encounters:  11/25/23 202 lb 9.6 oz (91.9 kg)  08/21/23 199 lb 12.8 oz (90.6 kg)  01/19/23 194 lb 3.6 oz (88.1 kg)     GEN:  Elderly, mild wob HEENT: Normal NECK: No JVD at 90 degrees in a chair LYMPHATICS: No lymphadenopathy CARDIAC: IRRR, no murmurs, rubs, gallops RESPIRATORY:  mild wob, no wheezing ABDOMEN: Soft, non-tender, non-distended MUSCULOSKELETAL:  No edema; No deformity  SKIN: Warm and dry NEUROLOGIC:  Alert and oriented x 3 PSYCHIATRIC:  Normal affect   ASSESSMENT:    Paroxysmal Afib: had recurrence with recent PNA. Started amidoarone. TSH normal 11/25/2022. Normal LFTs. COPD hx. -She is in atrial fibrillation today  and is symptomatic -Will reload with amiodarone  per below - cont. diltiazem  240 mg daily , on BB; also continue dilt 30mg  PRN. - continue eliquis  5 mg BID  HFpEF Dyspnea, PND -  start lasix  40 mg ; has potassium prescribed can continue this while on Lasix .  -spironolactone  was stopped with hyperkalemia (can retry if needed without K supplement, now on lasix )  HTN:  Well-controlled on labetalol  200 mg BID.  On diltiazem  240 mg daily. spironolactone  was stopped with hyperkalemia. PLAN:    In order of problems listed above:  CMET, TSH, BNP Start amiodarone  400 mg BID for 10 days, then amiodarone  200 mg daily Start lasix  40 mg daily Follow up in 2 weeks with an APP; if she remains in atrial fibrillation, can schedule a repeat DCCV      Medication Adjustments/Labs and Tests Ordered: Current medicines are reviewed at length with the patient today.  Concerns regarding medicines are outlined above.  No orders of the defined types were placed in this encounter.  No orders of the defined types were placed in this encounter.   There are no Patient Instructions on file for this visit.   Signed, Alvan Ronal BRAVO, MD  12/26/2023 2:54 PM    [facility]

## 2023-12-27 ENCOUNTER — Encounter: Payer: Self-pay | Admitting: Internal Medicine

## 2023-12-27 ENCOUNTER — Ambulatory Visit: Payer: Medicare Other | Attending: Internal Medicine | Admitting: Internal Medicine

## 2023-12-27 VITALS — BP 118/74 | Ht 64.0 in | Wt 206.0 lb

## 2023-12-27 DIAGNOSIS — I10 Essential (primary) hypertension: Secondary | ICD-10-CM | POA: Insufficient documentation

## 2023-12-27 DIAGNOSIS — I480 Paroxysmal atrial fibrillation: Secondary | ICD-10-CM | POA: Insufficient documentation

## 2023-12-27 DIAGNOSIS — I169 Hypertensive crisis, unspecified: Secondary | ICD-10-CM | POA: Diagnosis not present

## 2023-12-27 MED ORDER — AMIODARONE HCL 200 MG PO TABS: 200.0000 mg | ORAL_TABLET | Freq: Every day | ORAL | 3 refills | Status: DC

## 2023-12-27 MED ORDER — AMIODARONE HCL 200 MG PO TABS: 400.0000 mg | ORAL_TABLET | Freq: Every day | ORAL | 0 refills | Status: DC

## 2023-12-27 MED ORDER — AMIODARONE HCL 400 MG PO TABS: 400.0000 mg | ORAL_TABLET | Freq: Two times a day (BID) | ORAL | 0 refills | Status: DC

## 2023-12-27 MED ORDER — FUROSEMIDE 40 MG PO TABS: 40.0000 mg | ORAL_TABLET | Freq: Every day | ORAL | 3 refills | Status: DC

## 2023-12-27 NOTE — Patient Instructions (Signed)
 Medication Instructions:  Lasix  40 mg daily  Amiodarone  400 mg daily for ten days then 200 mg daily *If you need a refill on your cardiac medications before your next appointment, please call your pharmacy*   Lab Work: Cmet,tsh,bnp today  Bmet in 2 weeks  If you have labs (blood work) drawn today and your tests are completely normal, you will receive your results only by: MyChart Message (if you have MyChart) OR A paper copy in the mail If you have any lab test that is abnormal or we need to change your treatment, we will call you to review the results.   Testing/Procedures: None    Follow-Up: At [facility], you and your health needs are our priority.  As part of our continuing mission to provide you with exceptional heart care, we have created designated Provider Care Teams.  These Care Teams include your primary Cardiologist (physician) and Advanced Practice Providers (APPs -  Physician Assistants and Nurse Practitioners) who all work together to provide you with the care you need, when you need it.   Your next appointment:   2 week(s)  Provider:   With any APP   Other Instructions

## 2023-12-27 NOTE — Addendum Note (Signed)
 Addended byAlois Arnt on: 12/27/2023 09:59 AM   Modules accepted: Orders

## 2023-12-28 ENCOUNTER — Telehealth: Payer: Self-pay | Admitting: Cardiology

## 2023-12-28 LAB — COMPREHENSIVE METABOLIC PANEL
ALT: 13 [IU]/L (ref 0–32)
AST: 11 [IU]/L (ref 0–40)
Albumin: 4.1 g/dL (ref 3.7–4.7)
Alkaline Phosphatase: 78 [IU]/L (ref 44–121)
BUN/Creatinine Ratio: 16 (ref 12–28)
BUN: 13 mg/dL (ref 8–27)
Bilirubin Total: 0.3 mg/dL (ref 0.0–1.2)
CO2: 24 mmol/L (ref 20–29)
Calcium: 9.8 mg/dL (ref 8.7–10.3)
Chloride: 102 mmol/L (ref 96–106)
Creatinine, Ser: 0.82 mg/dL (ref 0.57–1.00)
Globulin, Total: 1.8 g/dL (ref 1.5–4.5)
Glucose: 98 mg/dL (ref 70–99)
Potassium: 4.3 mmol/L (ref 3.5–5.2)
Sodium: 142 mmol/L (ref 134–144)
Total Protein: 5.9 g/dL — ABNORMAL LOW (ref 6.0–8.5)
eGFR: 69 mL/min/{1.73_m2} (ref >59)

## 2023-12-28 LAB — TSH: TSH: 1.36 u[IU]/mL (ref 0.450–4.500)

## 2023-12-28 LAB — BRAIN NATRIURETIC PEPTIDE: BNP: 293.3 pg/mL — ABNORMAL HIGH (ref 0.0–100.0)

## 2023-12-28 NOTE — Telephone Encounter (Signed)
 Outpatient service line:  Patients son-in-law calling reporting to verify loading dose of amiodarone .  I guess there is some conflicting information by what was said and what was on the AVS.  Based off clinic note and what prescription was sent to the pharmacy this patient should be taking p.o. amiodarone  400 mg twice daily for 10 days and then 200 mg daily thereafter.  I have instructed patient to continue the above regimen and notified him that if it were to be any different than we would call.  If this is intended regiment no need to call back.

## 2023-12-30 ENCOUNTER — Encounter: Payer: Self-pay | Admitting: Internal Medicine

## 2024-01-15 ENCOUNTER — Emergency Department (HOSPITAL_COMMUNITY): Payer: Medicare Other

## 2024-01-15 ENCOUNTER — Inpatient Hospital Stay (HOSPITAL_COMMUNITY)
Admission: EM | Admit: 2024-01-15 | Discharge: 2024-01-22 | DRG: 193 | Disposition: A | Discharge status: Home / Self Care | Payer: Medicare Other | Attending: Internal Medicine | Admitting: Internal Medicine

## 2024-01-15 ENCOUNTER — Encounter (HOSPITAL_COMMUNITY): Payer: Self-pay | Admitting: *Deleted

## 2024-01-15 ENCOUNTER — Other Ambulatory Visit: Payer: Self-pay

## 2024-01-15 ENCOUNTER — Ambulatory Visit: Payer: Medicare Other | Admitting: General Practice

## 2024-01-15 DIAGNOSIS — J90 Pleural effusion, not elsewhere classified: Secondary | ICD-10-CM

## 2024-01-15 DIAGNOSIS — Z833 Family history of diabetes mellitus: Secondary | ICD-10-CM

## 2024-01-15 DIAGNOSIS — J449 Chronic obstructive pulmonary disease, unspecified: Secondary | ICD-10-CM | POA: Diagnosis present

## 2024-01-15 DIAGNOSIS — J440 Chronic obstructive pulmonary disease with acute lower respiratory infection: Secondary | ICD-10-CM | POA: Diagnosis present

## 2024-01-15 DIAGNOSIS — J9811 Atelectasis: Secondary | ICD-10-CM | POA: Diagnosis present

## 2024-01-15 DIAGNOSIS — Z7901 Long term (current) use of anticoagulants: Secondary | ICD-10-CM | POA: Diagnosis not present

## 2024-01-15 DIAGNOSIS — I509 Heart failure, unspecified: Secondary | ICD-10-CM | POA: Diagnosis not present

## 2024-01-15 DIAGNOSIS — I5032 Chronic diastolic (congestive) heart failure: Secondary | ICD-10-CM

## 2024-01-15 DIAGNOSIS — Z885 Allergy status to narcotic agent status: Secondary | ICD-10-CM

## 2024-01-15 DIAGNOSIS — Z8673 Personal history of transient ischemic attack (TIA), and cerebral infarction without residual deficits: Secondary | ICD-10-CM | POA: Diagnosis not present

## 2024-01-15 DIAGNOSIS — Z1152 Encounter for screening for COVID-19: Secondary | ICD-10-CM

## 2024-01-15 DIAGNOSIS — Z604 Social exclusion and rejection: Secondary | ICD-10-CM | POA: Diagnosis present

## 2024-01-15 DIAGNOSIS — Z881 Allergy status to other antibiotic agents status: Secondary | ICD-10-CM | POA: Diagnosis not present

## 2024-01-15 DIAGNOSIS — R600 Localized edema: Secondary | ICD-10-CM

## 2024-01-15 DIAGNOSIS — R0602 Shortness of breath: Secondary | ICD-10-CM

## 2024-01-15 DIAGNOSIS — I4819 Other persistent atrial fibrillation: Secondary | ICD-10-CM | POA: Diagnosis present

## 2024-01-15 DIAGNOSIS — I110 Hypertensive heart disease with heart failure: Secondary | ICD-10-CM | POA: Diagnosis present

## 2024-01-15 DIAGNOSIS — Z79899 Other long term (current) drug therapy: Secondary | ICD-10-CM

## 2024-01-15 DIAGNOSIS — J159 Unspecified bacterial pneumonia: Secondary | ICD-10-CM | POA: Diagnosis not present

## 2024-01-15 DIAGNOSIS — I10 Essential (primary) hypertension: Secondary | ICD-10-CM | POA: Diagnosis not present

## 2024-01-15 DIAGNOSIS — Z6835 Body mass index (BMI) 35.0-35.9, adult: Secondary | ICD-10-CM | POA: Diagnosis not present

## 2024-01-15 DIAGNOSIS — E66812 Obesity, class 2: Secondary | ICD-10-CM | POA: Diagnosis present

## 2024-01-15 DIAGNOSIS — E785 Hyperlipidemia, unspecified: Secondary | ICD-10-CM | POA: Diagnosis present

## 2024-01-15 DIAGNOSIS — I5033 Acute on chronic diastolic (congestive) heart failure: Secondary | ICD-10-CM | POA: Clinically undetermined

## 2024-01-15 DIAGNOSIS — I4891 Unspecified atrial fibrillation: Secondary | ICD-10-CM | POA: Diagnosis present

## 2024-01-15 DIAGNOSIS — J189 Pneumonia, unspecified organism: Principal | ICD-10-CM

## 2024-01-15 DIAGNOSIS — I5031 Acute diastolic (congestive) heart failure: Secondary | ICD-10-CM | POA: Diagnosis not present

## 2024-01-15 DIAGNOSIS — G4700 Insomnia, unspecified: Secondary | ICD-10-CM | POA: Diagnosis present

## 2024-01-15 DIAGNOSIS — M1711 Unilateral primary osteoarthritis, right knee: Secondary | ICD-10-CM | POA: Diagnosis present

## 2024-01-15 DIAGNOSIS — I77810 Thoracic aortic ectasia: Secondary | ICD-10-CM | POA: Diagnosis present

## 2024-01-15 LAB — CBC
HCT: 39.3 % (ref 36.0–46.0)
Hemoglobin: 12.5 g/dL (ref 12.0–15.0)
MCH: 28.2 pg (ref 26.0–34.0)
MCHC: 31.8 g/dL (ref 30.0–36.0)
MCV: 88.7 fL (ref 80.0–100.0)
Platelets: 278 10*3/uL (ref 150–400)
RBC: 4.43 MIL/uL (ref 3.87–5.11)
RDW: 14.5 % (ref 11.5–15.5)
WBC: 10.5 10*3/uL (ref 4.0–10.5)
nRBC: 0 % (ref 0.0–0.2)

## 2024-01-15 LAB — TROPONIN I (HIGH SENSITIVITY)
Troponin I (High Sensitivity): 11 ng/L (ref <18)
Troponin I (High Sensitivity): 11 ng/L (ref <18)

## 2024-01-15 LAB — BASIC METABOLIC PANEL
Anion gap: 10 (ref 5–15)
BUN: 14 mg/dL (ref 8–23)
CO2: 28 mmol/L (ref 22–32)
Calcium: 10.1 mg/dL (ref 8.9–10.3)
Chloride: 102 mmol/L (ref 98–111)
Creatinine, Ser: 0.95 mg/dL (ref 0.44–1.00)
GFR, Estimated: 58 mL/min — ABNORMAL LOW (ref >60)
Glucose, Bld: 103 mg/dL — ABNORMAL HIGH (ref 70–99)
Potassium: 3.8 mmol/L (ref 3.5–5.1)
Sodium: 140 mmol/L (ref 135–145)

## 2024-01-15 LAB — RESP PANEL BY RT-PCR (RSV, FLU A&B, COVID)  RVPGX2
Influenza A by PCR: NEGATIVE
Influenza B by PCR: NEGATIVE
Resp Syncytial Virus by PCR: NEGATIVE
SARS Coronavirus 2 by RT PCR: NEGATIVE

## 2024-01-15 LAB — BRAIN NATRIURETIC PEPTIDE: B Natriuretic Peptide: 202.5 pg/mL — ABNORMAL HIGH (ref 0.0–100.0)

## 2024-01-15 LAB — I-STAT CG4 LACTIC ACID, ED: Lactic Acid, Venous: 0.8 mmol/L (ref 0.5–1.9)

## 2024-01-15 MED ORDER — ACETAMINOPHEN 650 MG RE SUPP: 650.0000 mg | Freq: Four times a day (QID) | RECTAL | Status: DC | PRN

## 2024-01-15 MED ORDER — MIRTAZAPINE 15 MG PO TABS
15.0000 mg | ORAL_TABLET | Freq: Every day | ORAL | Status: DC
  Administered 2024-01-16 – 2024-01-21 (×7): 15 mg via ORAL
  Filled 2024-01-15 (×7): qty 1

## 2024-01-15 MED ORDER — SODIUM CHLORIDE 0.9 % IV SOLN
500.0000 mg | Freq: Once | INTRAVENOUS | Status: AC
  Administered 2024-01-15: 500 mg via INTRAVENOUS
  Filled 2024-01-15: qty 5

## 2024-01-15 MED ORDER — SODIUM CHLORIDE 0.9 % IV SOLN
1.0000 g | Freq: Once | INTRAVENOUS | Status: AC
  Administered 2024-01-15: 1 g via INTRAVENOUS
  Filled 2024-01-15: qty 10

## 2024-01-15 MED ORDER — FUROSEMIDE 10 MG/ML IJ SOLN
40.0000 mg | Freq: Once | INTRAMUSCULAR | Status: AC
  Administered 2024-01-15: 40 mg via INTRAVENOUS
  Filled 2024-01-15: qty 4

## 2024-01-15 MED ORDER — AMIODARONE HCL 200 MG PO TABS
200.0000 mg | ORAL_TABLET | Freq: Every day | ORAL | Status: DC
  Administered 2024-01-16 – 2024-01-18 (×3): 200 mg via ORAL
  Filled 2024-01-15 (×3): qty 1

## 2024-01-15 MED ORDER — LABETALOL HCL 200 MG PO TABS
200.0000 mg | ORAL_TABLET | Freq: Two times a day (BID) | ORAL | Status: DC
  Administered 2024-01-16 – 2024-01-17 (×5): 200 mg via ORAL
  Filled 2024-01-15 (×5): qty 1

## 2024-01-15 MED ORDER — DILTIAZEM HCL ER COATED BEADS 240 MG PO CP24
240.0000 mg | ORAL_CAPSULE | Freq: Every day | ORAL | Status: DC
  Administered 2024-01-16: 240 mg via ORAL
  Filled 2024-01-15 (×2): qty 1

## 2024-01-15 MED ORDER — ONDANSETRON HCL 4 MG PO TABS: 4.0000 mg | ORAL_TABLET | Freq: Four times a day (QID) | ORAL | Status: DC | PRN

## 2024-01-15 MED ORDER — ACETAMINOPHEN 325 MG PO TABS
650.0000 mg | ORAL_TABLET | Freq: Four times a day (QID) | ORAL | Status: DC | PRN
  Administered 2024-01-17 – 2024-01-22 (×7): 650 mg via ORAL
  Filled 2024-01-15 (×7): qty 2

## 2024-01-15 MED ORDER — PRAVASTATIN SODIUM 10 MG PO TABS
20.0000 mg | ORAL_TABLET | Freq: Every day | ORAL | Status: DC
  Administered 2024-01-16 – 2024-01-21 (×7): 20 mg via ORAL
  Filled 2024-01-15 (×7): qty 2

## 2024-01-15 MED ORDER — ONDANSETRON HCL 4 MG/2ML IJ SOLN: 4.0000 mg | Freq: Four times a day (QID) | INTRAMUSCULAR | Status: DC | PRN

## 2024-01-15 MED ORDER — APIXABAN 5 MG PO TABS
5.0000 mg | ORAL_TABLET | Freq: Two times a day (BID) | ORAL | Status: DC
  Administered 2024-01-16 – 2024-01-22 (×14): 5 mg via ORAL
  Filled 2024-01-15 (×14): qty 1

## 2024-01-15 NOTE — ED Notes (Signed)
 1st lac in normal range 2nd not needed

## 2024-01-15 NOTE — ED Provider Notes (Signed)
 [place] [facility]MENT AT [facility] Provider Note   CSN: [id number] Arrival date & time: 01/15/24  1537     History {Add pertinent medical, surgical, social history, OB history to HPI:1} Chief Complaint  Patient presents with   Chest Pain    [name] is a 89 y.o. female.   Chest Pain      Home Medications Prior to Admission medications   Medication Sig Start Date End Date Taking? Authorizing Provider  acetaminophen (TYLENOL) 500 MG tablet Take 2 tablets (1,000 mg total) by mouth every 8 (eight) hours. Patient taking differently: Take 1,000 mg by mouth every 8 (eight) hours as needed for mild pain (pain score 1-3), moderate pain (pain score 4-6) or headache. 09/16/20   Lanney Gins, PA-C  albuterol (VENTOLIN HFA) 108 (90 Base) MCG/ACT inhaler Inhale 2 puffs into the lungs every 4 (four) hours as needed for wheezing or shortness of breath. 06/08/21   [provider]  amiodarone (PACERONE) 200 MG tablet Take 1 tablet (200 mg total) by mouth daily. 01/07/24   Maisie Fus, MD  amiodarone (PACERONE) 400 MG tablet Take 1 tablet (400 mg total) by mouth 2 (two) times daily for 10 days. 12/27/23 01/06/24  Maisie Fus, MD  Ascorbic Acid (VITAMIN C) 1000 MG tablet Take 1,000 mg by mouth daily.    [provider]  cetirizine (ZYRTEC) 10 MG tablet Take 10 mg by mouth daily.     [provider]  Cholecalciferol (VITAMIN D3) 125 MCG (5000 UT) TABS Take 5,000 Units by mouth daily.    [provider]  cyanocobalamin 1000 MCG tablet Take 1,000 mcg by mouth every other day. Vitamin B12    [provider]  diclofenac Sodium (VOLTAREN) 1 % GEL Apply 2 g topically daily as needed (pain).    [provider]  diltiazem (CARDIZEM CD) 240 MG 24 hr capsule Take 1 capsule (240 mg total) by mouth daily. 12/05/22   Burnadette Pop, MD  diltiazem (CARDIZEM) 30 MG tablet Take 30 mg by mouth as needed (Afib). 11/09/23   [provider]  ELIQUIS 5 MG TABS tablet TAKE 1 TABLET BY MOUTH TWICE A DAY 07/16/22   Allred, Fayrene Fearing, MD  furosemide (LASIX) 40 MG tablet Take 1 tablet (40 mg total) by mouth daily. 12/27/23 03/26/24  Maisie Fus, MD  gabapentin (NEURONTIN) 300 MG capsule Take 300-600 mg by mouth See admin instructions. Take one capsule (300 mg) in the morning and two capsules (600 mg) in the evening.    [provider]  ipratropium-albuterol (DUONEB) 0.5-2.5 (3) MG/3ML SOLN Take 3 mLs by nebulization every 6 (six) hours as needed (shortness of breath). 12/04/22   Burnadette Pop, MD  labetalol (NORMODYNE) 200 MG tablet Take 200 mg by mouth 2 (two) times daily.    [provider]  lactulose (CHRONULAC) 10 GM/15ML solution Take 15 mLs (10 g total) by mouth 2 (two) times daily. 01/19/23   Leatha Gilding, MD  methocarbamol (ROBAXIN) 750 MG tablet Take 750-1,500 mg by mouth See admin instructions. Take 750 mg in the morning and 1500 mg in the PM 03/06/22   [provider]  mirtazapine (REMERON) 15 MG tablet Take 15 mg by mouth at bedtime. 02/27/22   [provider]  Multiple Vitamin (MULTIVITAMIN WITH MINERALS) TABS tablet Take 1 tablet by mouth daily. Fish oil    [provider]  Multiple Vitamins-Minerals (PRESERVISION AREDS 2 PO) Take 1 tablet by mouth 2 (  two) times daily. [employer], Historical, MD  Omega-3 Fatty Acids (OMEGA-3 FISH OIL PO) Take 1 capsule by mouth daily.    [provider]  pantoprazole (PROTONIX) 40 MG tablet Take 40 mg by mouth daily. 05/13/21   [provider]  potassium chloride SA (KLOR-CON M20) 20 MEQ tablet Take 1 tablet by mouth daily.    [provider]  pravastatin (PRAVACHOL) 20 MG tablet Take 20 mg by mouth at bedtime.    [provider]  sucralfate (CARAFATE) 1 g tablet Take 1 g by mouth with breakfast, with lunch, and with evening meal.    [provider]  Vitamin E 400 units TABS Take  400 Units by mouth daily. 07/14/21   [provider]      Allergies    Keflex [cephalexin] and Morphine    Review of Systems   Review of Systems  Cardiovascular:  Positive for chest pain.    Physical Exam Updated Vital Signs BP (!) 163/100   Pulse 88   Temp 98.6 F (37 C)   Resp 17   Ht 5\' 4"  (1.626 m)   Wt 93.4 kg   SpO2 93%   BMI 35.34 kg/m  Physical Exam  ED Results / Procedures / Treatments   Labs (all labs ordered are listed, but only abnormal results are displayed) Labs Reviewed  BASIC METABOLIC PANEL - Abnormal; Notable for the following components:      Result Value   Glucose, Bld 103 (*)    GFR, Estimated 58 (*)    All other components within normal limits  BRAIN NATRIURETIC PEPTIDE - Abnormal; Notable for the following components:   B Natriuretic Peptide 202.5 (*)    All other components within normal limits  CBC  TROPONIN I (HIGH SENSITIVITY)  TROPONIN I (HIGH SENSITIVITY)    EKG None  Radiology DG Chest 2 View Result Date: 01/15/2024 CLINICAL DATA:  Chest pain, dyspnea with exertion. EXAM: CHEST - 2 VIEW COMPARISON:  January 13, 2023. FINDINGS: Stable cardiomediastinal silhouette. Small right pleural effusion is noted with adjacent subsegmental atelectasis. Right upper lobe airspace opacity is noted concerning for pneumonia. Left lung is clear. Bony thorax is unremarkable. IMPRESSION: Right upper lobe airspace opacity is again noted concerning for pneumonia. Small right pleural effusion is noted with adjacent subsegmental atelectasis. Electronically Signed   By: Lupita Raider M.D.   On: 01/15/2024 18:22    Procedures Procedures  {Document cardiac monitor, telemetry assessment procedure when appropriate:1}  Medications Ordered in ED Medications - No data to display  ED Course/ Medical Decision Making/ A&P   {   Click here for ABCD2, HEART and other calculatorsREFRESH Note before signing :1}                              Medical  Decision Making   [name] is a 89 y.o. female with a past medical history significant for atrial fibrillation on Eliquis therapy, previous stroke, hyperlipidemia, hypertension, GERD, and COPD who presents with worsening shortness of breath, fatigue, peripheral edema, and cough.  According to patient, she has had worsening fatigue and shortness of breath for the last week or 2 and has had worsening peripheral edema in her legs primarily.  Family says she cannot get up and walk across the room like she normally can without getting extremely winded and in respiratory distress.  When I first saw patient oxygen saturation  was around 91%.  Patient does report some cough but no production.  She denies fevers or chills.  She is denying any chest pain or palpitations and denies any nausea, vomiting, constipation, or diarrhea.  Denies any urinary changes.  Denies any other complaints.  She has been on medications for A-fib but is retaining more fluid despite outpatient diuresis attempt.  On my exam, she has edema in both legs but is not having tenderness.  She did have intact pulses.  Lungs had some rhonchi and rales primarily.  Also some faint wheezing.  Chest and abdomen nontender.  I not appreciate a murmur initially.  Patient resting but is tachypneic in the 30s and is using her abdomen to breathe as well.  Oxygen saturation was around 91% for me.  Patient had workup starting in triage as she has been here for over 4 hours prior to my initial evaluation.  X-ray shows evidence of pneumonia and some pleural effusion/edema.  I am concerned she has both fluid overload and new pneumonia.  Will order antibiotics, will order diuresis.  She will get other labs including a lactic acid, blood cultures, and a COVID swab.  BNP is similar to prior but I am concerned for fluid overload.  Will call for admission for further management diuresis and antibiotics.     {Document critical care time when  appropriate:1} {Document review of labs and clinical decision tools ie heart score, Chads2Vasc2 etc:1}  {Document your independent review of radiology images, and any outside records:1} {Document your discussion with family members, caretakers, and with consultants:1} {Document social determinants of health affecting pt's care:1} {Document your decision making why or why not admission, treatments were needed:1} Final Clinical Impression(s) / ED Diagnoses Final diagnoses:  None    Rx / DC Orders ED Discharge Orders     None

## 2024-01-15 NOTE — ED Provider Triage Note (Signed)
 Emergency Medicine Provider Triage Evaluation Note  [name] , a 89 y.o. female  was evaluated in triage.  Pt complains of chest pain, shortness of breath. Symptoms progressing for the past week or so.  Swelling in bilateral legs as well.  Recently started on Lasix for this, has been taking this as prescribed without significant improvement.  Became more short of breath and pain on exertion today and presents for same.  Patient is currently in A-fib, patient's son who is an ER doctor notes that she has been in A-fib for about a week and a half.  She does have a history of this.  She has been compliant with her Eliquis.  They have been giving her increased doses of her diltiazem to keep her heart rate down.   Review of Systems  Positive: Negative:   Physical Exam  BP (!) 163/100   Pulse 88   Temp 98.6 F (37 C)   Resp 17   Ht 5\' 4"  (1.626 m)   Wt 93.4 kg   SpO2 93%   BMI 35.34 kg/m  Gen:   Awake, no distress   Resp:  Normal effort  MSK:   Moves extremities without difficulty  Other:  2+ pitting edema bilaterally  Medical Decision Making  Medically screening exam initiated at 5:18 PM.  Appropriate orders placed.  [name] was informed that the remainder of the evaluation will be completed by another provider, this initial triage assessment does not replace that evaluation, and the importance of remaining in the ED until their evaluation is complete.     Katelyn Bandy, PA-C 01/15/24 [phone]

## 2024-01-15 NOTE — ED Triage Notes (Signed)
 The pt is c/o chest pain sob with exertion and feet and leg swollen in the past few days  hx  af

## 2024-01-16 ENCOUNTER — Ambulatory Visit: Payer: Medicare Other | Admitting: Cardiology

## 2024-01-16 ENCOUNTER — Inpatient Hospital Stay (HOSPITAL_COMMUNITY): Payer: Medicare Other

## 2024-01-16 DIAGNOSIS — J159 Unspecified bacterial pneumonia: Secondary | ICD-10-CM | POA: Diagnosis not present

## 2024-01-16 DIAGNOSIS — I10 Essential (primary) hypertension: Secondary | ICD-10-CM

## 2024-01-16 DIAGNOSIS — J189 Pneumonia, unspecified organism: Secondary | ICD-10-CM | POA: Insufficient documentation

## 2024-01-16 DIAGNOSIS — J449 Chronic obstructive pulmonary disease, unspecified: Secondary | ICD-10-CM

## 2024-01-16 DIAGNOSIS — E66812 Obesity, class 2: Secondary | ICD-10-CM

## 2024-01-16 DIAGNOSIS — I5032 Chronic diastolic (congestive) heart failure: Secondary | ICD-10-CM

## 2024-01-16 DIAGNOSIS — I5033 Acute on chronic diastolic (congestive) heart failure: Secondary | ICD-10-CM | POA: Diagnosis present

## 2024-01-16 DIAGNOSIS — I4891 Unspecified atrial fibrillation: Secondary | ICD-10-CM

## 2024-01-16 MED ORDER — DILTIAZEM LOAD VIA INFUSION
10.0000 mg | Freq: Once | INTRAVENOUS | Status: AC
  Administered 2024-01-16: 10 mg via INTRAVENOUS
  Filled 2024-01-16: qty 10

## 2024-01-16 MED ORDER — FUROSEMIDE 10 MG/ML IJ SOLN
40.0000 mg | Freq: Two times a day (BID) | INTRAMUSCULAR | Status: DC
  Administered 2024-01-16 – 2024-01-17 (×3): 40 mg via INTRAVENOUS
  Filled 2024-01-16 (×3): qty 4

## 2024-01-16 MED ORDER — ACETAMINOPHEN 500 MG PO TABS
1000.0000 mg | ORAL_TABLET | Freq: Once | ORAL | Status: AC
  Administered 2024-01-16: 1000 mg via ORAL
  Filled 2024-01-16: qty 2

## 2024-01-16 MED ORDER — IPRATROPIUM-ALBUTEROL 0.5-2.5 (3) MG/3ML IN SOLN
3.0000 mL | RESPIRATORY_TRACT | Status: DC | PRN
Start: 2024-01-16 — End: 2024-01-22

## 2024-01-16 MED ORDER — DILTIAZEM HCL-DEXTROSE 125-5 MG/125ML-% IV SOLN (PREMIX)
5.0000 mg/h | INTRAVENOUS | Status: DC
  Administered 2024-01-16: 5 mg/h via INTRAVENOUS
  Administered 2024-01-17: 10 mg/h via INTRAVENOUS
  Filled 2024-01-16 (×3): qty 125

## 2024-01-16 MED ORDER — GABAPENTIN 300 MG PO CAPS
600.0000 mg | ORAL_CAPSULE | Freq: Every day | ORAL | Status: DC
  Administered 2024-01-16 – 2024-01-21 (×6): 600 mg via ORAL
  Filled 2024-01-16 (×6): qty 2

## 2024-01-16 MED ORDER — MELATONIN 3 MG PO TABS
6.0000 mg | ORAL_TABLET | Freq: Every evening | ORAL | Status: DC | PRN
  Administered 2024-01-16 – 2024-01-21 (×6): 6 mg via ORAL
  Filled 2024-01-16 (×6): qty 2

## 2024-01-16 MED ORDER — LEVALBUTEROL HCL 0.63 MG/3ML IN NEBU
0.6300 mg | INHALATION_SOLUTION | Freq: Four times a day (QID) | RESPIRATORY_TRACT | Status: DC | PRN
  Administered 2024-01-16 – 2024-01-19 (×5): 0.63 mg via RESPIRATORY_TRACT
  Filled 2024-01-16 (×6): qty 3

## 2024-01-16 MED ORDER — AZITHROMYCIN 250 MG PO TABS
500.0000 mg | ORAL_TABLET | Freq: Every day | ORAL | Status: DC
  Administered 2024-01-16 – 2024-01-18 (×3): 500 mg via ORAL
  Filled 2024-01-16 (×3): qty 2

## 2024-01-16 MED ORDER — MAGNESIUM SULFATE 2 GM/50ML IV SOLN
2.0000 g | Freq: Once | INTRAVENOUS | Status: AC
  Administered 2024-01-16: 2 g via INTRAVENOUS
  Filled 2024-01-16: qty 50

## 2024-01-16 MED ORDER — GABAPENTIN 300 MG PO CAPS
300.0000 mg | ORAL_CAPSULE | Freq: Every day | ORAL | Status: DC
  Administered 2024-01-16 – 2024-01-22 (×7): 300 mg via ORAL
  Filled 2024-01-16 (×7): qty 1

## 2024-01-16 MED ORDER — GABAPENTIN 300 MG PO CAPS
300.0000 mg | ORAL_CAPSULE | ORAL | Status: DC
Start: 2024-01-16 — End: 2024-01-16

## 2024-01-16 MED ORDER — METHOCARBAMOL 500 MG PO TABS
750.0000 mg | ORAL_TABLET | ORAL | Status: DC
Start: 2024-01-16 — End: 2024-01-16

## 2024-01-16 MED ORDER — SODIUM CHLORIDE 0.9 % IV SOLN
2.0000 g | INTRAVENOUS | Status: DC
  Administered 2024-01-16 – 2024-01-18 (×3): 2 g via INTRAVENOUS
  Filled 2024-01-16 (×3): qty 20

## 2024-01-16 MED ORDER — POTASSIUM CHLORIDE CRYS ER 20 MEQ PO TBCR
40.0000 meq | EXTENDED_RELEASE_TABLET | ORAL | Status: AC
  Administered 2024-01-16 (×2): 40 meq via ORAL
  Filled 2024-01-16: qty 4
  Filled 2024-01-16: qty 2

## 2024-01-16 MED ORDER — METHOCARBAMOL 500 MG PO TABS
750.0000 mg | ORAL_TABLET | Freq: Every day | ORAL | Status: DC
  Administered 2024-01-16 – 2024-01-22 (×7): 750 mg via ORAL
  Filled 2024-01-16 (×7): qty 2

## 2024-01-16 MED ORDER — METHOCARBAMOL 500 MG PO TABS
1500.0000 mg | ORAL_TABLET | Freq: Every day | ORAL | Status: DC
  Administered 2024-01-16 – 2024-01-21 (×6): 1500 mg via ORAL
  Filled 2024-01-16 (×6): qty 3

## 2024-01-16 NOTE — Plan of Care (Signed)

## 2024-01-16 NOTE — Assessment & Plan Note (Addendum)
 01-16-2024 on cardizem gtts. Being used for rapid afib but will also help her BP.  Continue with IV lasix. 01-17-2024 stop IV cardizem gtts. Restart cardizem CD. Continue labetalol. 01-18-2024 stable. Cardizem CD increased to 300 mg qday. Still stop labetalol.  01-19-2024. Keep off labetalol. May need to add scheduled lopressor. Pt was a little low this AM.  Cardizem CD 240 mg daily.

## 2024-01-16 NOTE — Assessment & Plan Note (Addendum)
 01-16-2024 last echo 09-2022 LVEF 60%. Will update echo. 01-17-2024 echo shows LVEF 55%  01-18-2024 stable. Given pt's COPD, cannnot use large doses of betablockers.

## 2024-01-16 NOTE — Assessment & Plan Note (Addendum)
 01-16-2024 not exacerbated at this point I don't think. No indication for IV steroids for now. Continue with prn nebs. I think once she is diuresed her breathing will improve. 01-17-2024 her wheezing has improved. Does not need steroids. 01-18-2024 stable. Chronic.  01-19-2024 stable. Chronic.

## 2024-01-16 NOTE — Progress Notes (Signed)
 PROGRESS NOTE    [name]  ZOX:[id number] DOB: August 12, 1933 DOA: 01/15/2024 PCP: Hadley Pen, MD  Subjective: Pt seen and examined. Similar admission from Jan 2024.  In rapid afib this AM in ER. HR in 110-120s.  SOB and wheezing. Having increased LE edema.  Met with pt, pt's dtr pat and son-in-law Vince at bedside.   Hospital Course: HPI:  [name] is a 89 y.o. female with medical history hypertension, prior stroke, hyperlipidemia, GERD who presents emerged part with shortness of breath and chest pain.  Patient states that she has had 3 days of progressively worsening shortness of breath and discomfort with breathing.  She presented to the ER where she was found to be afebrile hemodynamically able.  Obtained on presentation which showed BMP unrevealing, WBC 10.5, hemoglobin 12.5, troponin 11, 11.  BNP 202.  Respiratory viral panel was negative for infection.  Patient underwent chest x-ray which demonstrated right upper lobe opacity concerning for infection.  Patient was started on ceftriaxone and azithromycin admitted further workup.   Significant Events: Admitted 01/15/2024 for RUL pneumonia   Significant Labs: WBC 10.5, HgB 12.5, plt 278 Na 140, K 3.8, CO2 of 28, BUN 14, scr 0.95 BNP 202  Significant Imaging Studies: CXR Right upper lobe airspace opacity is again noted concerning for pneumonia. Small right pleural effusion is noted with adjacent subsegmental atelectasis.  Antibiotic Therapy: Anti-infectives (From admission, onward)    Start     Dose/Rate Route Frequency Ordered Stop   01/16/24 1000  cefTRIAXone (ROCEPHIN) 2 g in sodium chloride 0.9 % 100 mL IVPB        2 g 200 mL/hr over 30 Minutes Intravenous Every 24 hours 01/16/24 0250     01/16/24 1000  azithromycin (ZITHROMAX) tablet 500 mg        500 mg Oral Daily 01/16/24 0250     01/15/24 2015  cefTRIAXone (ROCEPHIN) 1 g in sodium chloride 0.9 % 100 mL IVPB        1 g 200 mL/hr over 30 Minutes  Intravenous  Once 01/15/24 2013 01/16/24 0000   01/15/24 2015  azithromycin (ZITHROMAX) 500 mg in sodium chloride 0.9 % 250 mL IVPB        500 mg 250 mL/hr over 60 Minutes Intravenous  Once 01/15/24 2013 01/16/24 0000       Procedures:   Consultants:     Assessment and Plan: * CAP (community acquired pneumonia) 01-16-2024 CT chest almost that same as it was Jan 2024.  Continue with Rocephin/zithromax. Prn duonebs.  Acute on chronic diastolic CHF (congestive heart failure) (HCC) 01-16-2024 will start IV lasix 40 mg q12h. Update echo.  Atrial fibrillation with RVR (HCC) 01-16-2024 start IV cardizem gtts. Will update echo. Has moderately elevated pulm pressure in 09-2022. Get K>4 and Mg >2.0 with repletion.  Chronic diastolic CHF (congestive heart failure) (HCC) 01-16-2024 last echo 09-2022 LVEF 60%. Will update echo.  Obesity, Class II, BMI 35-39.9 Estimated body mass index is 35.34 kg/m as calculated from the following:   Height as of this encounter: 5\' 4"  (1.626 m).   Weight as of this encounter: 93.4 kg.   Essential hypertension 01-16-2024 on cardizem gtts. Being used for rapid afib but will also help her BP.  Continue with IV lasix.  COPD (chronic obstructive pulmonary disease) (HCC) 01-16-2024 not exacerbated at this point I don't think. No indication for IV steroids for now. Continue with prn nebs. I think once she is diuresed her breathing will improve.  DVT prophylaxis: SCDs Start: 01/15/24 2210 apixaban (ELIQUIS) tablet 5 mg     Code Status: Full Code Family Communication: discussed with pt's dtr and son-in-law at bedside Disposition Plan: return home Reason for continuing need for hospitalization: on IV cardizem gtts, IV lasix. Pending echo.  Objective: Vitals:   01/16/24 0903 01/16/24 0904 01/16/24 0941 01/16/24 1100  BP: (!) 150/104 (!) 150/104 (!) 171/117 113/76  Pulse: (!) 114 (!) 114  98  Resp: 19   (!) 30  Temp: 97.6 F (36.4 C)     TempSrc: Oral      SpO2: 100%   95%  Weight:      Height:       No intake or output data in the 24 hours ending 01/16/24 1430 Filed Weights   01/15/24 1654  Weight: 93.4 kg    Examination:  Physical Exam Vitals and nursing note reviewed.  Constitutional:      General: She is not in acute distress.    Appearance: She is obese. She is ill-appearing. She is not toxic-appearing or diaphoretic.  HENT:     Head: Normocephalic and atraumatic.     Nose: Nose normal.  Eyes:     General: No scleral icterus. Cardiovascular:     Rate and Rhythm: Tachycardia present. Rhythm irregular.  Pulmonary:     Effort: Pulmonary effort is normal.     Breath sounds: Rales present.     Comments: Scattered wheezing Abdominal:     General: Bowel sounds are normal.     Palpations: Abdomen is soft.  Musculoskeletal:     Right lower leg: Edema present.     Left lower leg: Edema present.     Comments: +1 pitting pretibial edema  Skin:    General: Skin is warm and dry.     Capillary Refill: Capillary refill takes less than 2 seconds.  Neurological:     General: No focal deficit present.     Mental Status: She is alert and oriented to person, place, and time.    Data Reviewed: I have personally reviewed following labs and imaging studies  CBC: Recent Labs  Lab 01/15/24 1719  WBC 10.5  HGB 12.5  HCT 39.3  MCV 88.7  PLT 278   Basic Metabolic Panel: Recent Labs  Lab 01/15/24 1719  NA 140  K 3.8  CL 102  CO2 28  GLUCOSE 103*  BUN 14  CREATININE 0.95  CALCIUM 10.1   GFR: Estimated Creatinine Clearance: 45.4 mL/min (by C-G formula based on SCr of 0.95 mg/dL). BNP (last 3 results) Recent Labs    12/27/23 1001 01/15/24 1720  BNP 293.3* 202.5*   Sepsis Labs: Recent Labs  Lab 01/15/24 2110  LATICACIDVEN 0.8    Recent Results (from the past 240 hours)  Resp panel by RT-PCR (RSV, Flu A&B, Covid) Anterior Nasal Swab     Status: None   Collection Time: 01/15/24  8:12 PM   Specimen: Anterior  Nasal Swab  Result Value Ref Range Status   SARS Coronavirus 2 by RT PCR NEGATIVE NEGATIVE Final   Influenza A by PCR NEGATIVE NEGATIVE Final   Influenza B by PCR NEGATIVE NEGATIVE Final    Comment: (NOTE) The Xpert Xpress SARS-CoV-2/FLU/RSV plus assay is intended as an aid in the diagnosis of influenza from Nasopharyngeal swab specimens and should not be used as a sole basis for treatment. Nasal washings and aspirates are unacceptable for Xpert Xpress SARS-CoV-2/FLU/RSV testing.  Fact Sheet for Patients: BloggerCourse.com  Fact Sheet  for Healthcare Providers: SeriousBroker.it  This test is not yet approved or cleared by the [place] and has been authorized for detection and/or diagnosis of SARS-CoV-2 by FDA under an Emergency Use Authorization (EUA). This EUA will remain in effect (meaning this test can be used) for the duration of the COVID-19 declaration under Section 564(b)(1) of the Act, 21 U.S.C. section 360bbb-3(b)(1), unless the authorization is terminated or revoked.     Resp Syncytial Virus by PCR NEGATIVE NEGATIVE Final    Comment: (NOTE) Fact Sheet for Patients: BloggerCourse.com  Fact Sheet for Healthcare Providers: SeriousBroker.it  This test is not yet approved or cleared by the [place] FDA and has been authorized for detection and/or diagnosis of SARS-CoV-2 by FDA under an Emergency Use Authorization (EUA). This EUA will remain in effect (meaning this test can be used) for the duration of the COVID-19 declaration under Section 564(b)(1) of the Act, 21 U.S.C. section 360bbb-3(b)(1), unless the authorization is terminated or revoked.  Performed at [facility], [address] [place], [place] [address]   Blood culture (routine x 2)     Status: None (Preliminary result)   Collection Time: 01/15/24  8:40 PM   Specimen: BLOOD RIGHT HAND   Result Value Ref Range Status   Specimen Description BLOOD RIGHT HAND  Final   Special Requests   Final    BOTTLES DRAWN AEROBIC AND ANAEROBIC Blood Culture results may not be optimal due to an inadequate volume of blood received in culture bottles   Culture   Final    NO GROWTH < 12 HOURS Performed at [facility], [address] [place], [place] [address]    Report Status PENDING  Incomplete     Radiology Studies: CT CHEST WO CONTRAST Result Date: 01/16/2024 CLINICAL DATA:  Pneumonia, complication suspected, xray done EXAM: CT CHEST WITHOUT CONTRAST TECHNIQUE: Multidetector CT imaging of the chest was performed following the standard protocol without IV contrast. RADIATION DOSE REDUCTION: This exam was performed according to the departmental dose-optimization program which includes automated exposure control, adjustment of the mA and/or kV according to patient size and/or use of iterative reconstruction technique. COMPARISON:  X-ray 01/15/2024, CT 11/25/2022 FINDINGS: Cardiovascular: Heart size is upper limits of normal. Trace pericardial fluid. Thoracic aorta is nonaneurysmal. Scattered atherosclerotic vascular calcifications of the aorta and coronary arteries. Central pulmonary vasculature remains mildly dilated. Mediastinum/Nodes: Multiple calcified mediastinal and right hilar lymph nodes without evidence of new or progressive adenopathy. No axillary lymphadenopathy. Trachea and esophagus within normal limits. Lungs/Pleura: Small-moderate sized right-sided pleural effusion, increased. Dependent right basilar atelectasis and patchy airspace opacity within the more superior aspect of the right lower lobe. Additionally, there is interlobular septal thickening and patchy ground-glass attenuation throughout the right upper lobe. Left lung is clear. No pneumothorax. Upper Abdomen: 14 mm left adrenal gland nodule with internal density of 13 HU. No acute abnormality. Musculoskeletal: No new or  acute bony or chest wall abnormality. IMPRESSION: 1. Small-moderate sized right-sided pleural effusion, increased. 2. Dependent right basilar atelectasis and patchy airspace opacity within the more superior aspect of the right lower lobe. Additionally, there is interlobular septal thickening and patchy ground-glass attenuation throughout the right upper lobe. Findings may represent a combination of asymmetric edema with atelectasis and pneumonia. 3. Incidentally noted 14 mm left adrenal gland nodule, likely a benign adenoma. 4. Aortic and coronary artery atherosclerosis (ICD10-I70.0). Electronically Signed   By: Duanne Guess D.O.   On: 01/16/2024 10:46   DG  Chest 2 View Result Date: 01/15/2024 CLINICAL DATA:  Chest pain, dyspnea with exertion. EXAM: CHEST - 2 VIEW COMPARISON:  January 13, 2023. FINDINGS: Stable cardiomediastinal silhouette. Small right pleural effusion is noted with adjacent subsegmental atelectasis. Right upper lobe airspace opacity is noted concerning for pneumonia. Left lung is clear. Bony thorax is unremarkable. IMPRESSION: Right upper lobe airspace opacity is again noted concerning for pneumonia. Small right pleural effusion is noted with adjacent subsegmental atelectasis. Electronically Signed   By: Lupita Raider M.D.   On: 01/15/2024 18:22    Scheduled Meds:  amiodarone  200 mg Oral Daily   apixaban  5 mg Oral BID   azithromycin  500 mg Oral Daily   diltiazem  10 mg Intravenous Once   furosemide  40 mg Intravenous BID   gabapentin  300 mg Oral Daily   And   gabapentin  600 mg Oral Daily   labetalol  200 mg Oral BID   methocarbamol  750 mg Oral Daily   And   methocarbamol  1,500 mg Oral Daily   mirtazapine  15 mg Oral QHS   potassium chloride  40 mEq Oral Q4H   pravastatin  20 mg Oral QHS   Continuous Infusions:  cefTRIAXone (ROCEPHIN)  IV Stopped (01/16/24 0942)   diltiazem (CARDIZEM) infusion       LOS: 1 day   Time spent: 40 minutes  Carollee Herter,  DO  [facility]  01/16/2024, 2:30 PM

## 2024-01-16 NOTE — Hospital Course (Signed)
HPI:  [name] is a 89 y.o. female with medical history hypertension, prior stroke, hyperlipidemia, GERD who presents emerged part with shortness of breath and chest pain.  Patient states that she has had 3 days of progressively worsening shortness of breath and discomfort with breathing.  She presented to the ER where she was found to be afebrile hemodynamically able.  Obtained on presentation which showed BMP unrevealing, WBC 10.5, hemoglobin 12.5, troponin 11, 11.  BNP 202.  Respiratory viral panel was negative for infection.  Patient underwent chest x-ray which demonstrated right upper lobe opacity concerning for infection.  Patient was started on ceftriaxone and azithromycin admitted further workup.   Significant Events: Admitted 01/15/2024 for RUL pneumonia   Significant Labs: WBC 10.5, HgB 12.5, plt 278 Na 140, K 3.8, CO2 of 28, BUN 14, scr 0.95 BNP 202  Significant Imaging Studies: CXR Right upper lobe airspace opacity is again noted concerning for pneumonia. Small right pleural effusion is noted with adjacent subsegmental atelectasis.  Antibiotic Therapy: Anti-infectives (From admission, onward)    Start     Dose/Rate Route Frequency Ordered Stop   01/16/24 1000  cefTRIAXone (ROCEPHIN) 2 g in sodium chloride 0.9 % 100 mL IVPB        2 g 200 mL/hr over 30 Minutes Intravenous Every 24 hours 01/16/24 0250     01/16/24 1000  azithromycin (ZITHROMAX) tablet 500 mg        500 mg Oral Daily 01/16/24 0250     01/15/24 2015  cefTRIAXone (ROCEPHIN) 1 g in sodium chloride 0.9 % 100 mL IVPB        1 g 200 mL/hr over 30 Minutes Intravenous  Once 01/15/24 2013 01/16/24 0000   01/15/24 2015  azithromycin (ZITHROMAX) 500 mg in sodium chloride 0.9 % 250 mL IVPB        500 mg 250 mL/hr over 60 Minutes Intravenous  Once 01/15/24 2013 01/16/24 0000       Procedures:   Consultants:

## 2024-01-16 NOTE — Assessment & Plan Note (Addendum)
 01-16-2024 will start IV lasix 40 mg q12h. Update echo. 01-17-2024 LVEF 55%. Still stop IV lasix. Change back to po lasix 40 mg daily. 01-18-2024 continue with lasix 40 mg daily. I think what drove her acute CHF is probably rapid afib.  01-19-2024 will increase her lasix to 40 mg bid. Add kcl replacement

## 2024-01-16 NOTE — H&P (Signed)
 History and Physical    [name] ZOX:[id number] DOB: January 04, 1933 DOA: 01/15/2024  PCP: Hadley Pen, MD   Chief Complaint:  chest pain, sob  HPI: [name] is a 89 y.o. female with medical history hypertension, prior stroke, hyperlipidemia, GERD who presents emerged part with shortness of breath and chest pain.  Patient states that she has had 3 days of progressively worsening shortness of breath and discomfort with breathing.  She presented to the ER where she was found to be afebrile hemodynamically able.  Obtained on presentation which showed BMP unrevealing, WBC 10.5, hemoglobin 12.5, troponin 11, 11.  BNP 202.  Respiratory viral panel was negative for infection.  Patient underwent chest x-ray which demonstrated right upper lobe opacity concerning for infection.  Patient was started on ceftriaxone and azithromycin admitted further workup.   Review of Systems: Review of Systems  Constitutional:  Negative for fever.  HENT: Negative.    Eyes: Negative.   Respiratory:  Positive for cough and shortness of breath.   Cardiovascular: Negative.   Gastrointestinal: Negative.   Genitourinary: Negative.   Musculoskeletal: Negative.   Skin: Negative.   Endo/Heme/Allergies: Negative.   Psychiatric/Behavioral: Negative.    All other systems reviewed and are negative.    As per HPI otherwise 10 point review of systems negative.   Allergies  Allergen Reactions   Keflex [Cephalexin] Other (See Comments)    "makes pt feel really bad"  Tolerated Cephalosporin Date: 01/15/23.     Morphine Anxiety and Other (See Comments)    Pt states this made her feel like she was going to die    Past Medical History:  Diagnosis Date   Arthritis    Atrial fibrillation, transient (HCC)    post op hip surgery; May 2021   Diverticulitis    Dyspnea    with exertion    GERD (gastroesophageal reflux disease)    Hyperlipidemia    Hypertension    Stroke (HCC)    hx opf mini strokes    TIA  (transient ischemic attack)     Past Surgical History:  Procedure Laterality Date   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/13/2020   Procedure: CONVERSION TO LEFT TOTAL HIP-POSTERIOR APPROACH;  Surgeon: Durene Romans, MD;  Location: [facility];  Service: Orthopedics;  Laterality: Left;  90 mins   HIP FRACTURE SURGERY Left 04/16/2020   TIBIA IM NAIL INSERTION Right 01/15/2023   Procedure: INTRAMEDULLARY (IM) NAIL  RIGHT TIBIAL;  Surgeon: Myrene Galas, MD;  Location: [facility] OR;  Service: Orthopedics;  Laterality: Right;     reports that she has never smoked. She has never used smokeless tobacco. She reports that she does not drink alcohol and does not use drugs.  Family History  Problem Relation Age of Onset   Diabetes Mother     Prior to Admission medications   Medication Sig Start Date End Date Taking? Authorizing Provider  acetaminophen (TYLENOL) 500 MG tablet Take 2 tablets (1,000 mg total) by mouth every 8 (eight) hours. Patient taking differently: Take 1,000 mg by mouth every 8 (eight) hours as needed for mild pain (pain score 1-3), moderate pain (pain score 4-6) or headache. 09/16/20  Yes Babish, Molli Hazard, PA-C  albuterol (VENTOLIN HFA) 108 (90 Base) MCG/ACT inhaler Inhale 2 puffs into the lungs every 4 (four) hours as needed for wheezing or shortness of breath. 06/08/21  Yes [provider]  amiodarone (PACERONE) 200 MG tablet Take 1 tablet (200 mg total) by mouth daily. 01/07/24  Yes Carolan Clines  E, MD  Ascorbic Acid (VITAMIN C) 1000 MG tablet Take 1,000 mg by mouth daily.   Yes [provider]  cetirizine (ZYRTEC) 10 MG tablet Take 10 mg by mouth daily.    Yes [provider]  Cholecalciferol (VITAMIN D3) 125 MCG (5000 UT) TABS Take 5,000 Units by mouth daily.   Yes [provider]  cyanocobalamin 1000 MCG tablet Take 1,000 mcg by mouth every other day. Vitamin B12   Yes [provider]  diclofenac Sodium (VOLTAREN) 1 % GEL Apply 2 g topically daily as  needed (pain).   Yes [provider]  diltiazem (CARDIZEM CD) 240 MG 24 hr capsule Take 1 capsule (240 mg total) by mouth daily. 12/05/22  Yes Burnadette Pop, MD  diltiazem (CARDIZEM) 30 MG tablet Take 30 mg by mouth as needed (Afib). 11/09/23  Yes [provider]  ELIQUIS 5 MG TABS tablet TAKE 1 TABLET BY MOUTH TWICE A DAY 07/16/22  Yes Allred, Fayrene Fearing, MD  furosemide (LASIX) 40 MG tablet Take 1 tablet (40 mg total) by mouth daily. 12/27/23 03/26/24 Yes BranchAlben Spittle, MD  gabapentin (NEURONTIN) 300 MG capsule Take 300-600 mg by mouth See admin instructions. Take one capsule (300 mg) in the morning and two capsules (600 mg) in the evening.   Yes [provider]  ipratropium-albuterol (DUONEB) 0.5-2.5 (3) MG/3ML SOLN Take 3 mLs by nebulization every 6 (six) hours as needed (shortness of breath). 12/04/22  Yes Burnadette Pop, MD  labetalol (NORMODYNE) 200 MG tablet Take 200 mg by mouth 2 (two) times daily.   Yes [provider]  methocarbamol (ROBAXIN) 750 MG tablet Take 750-1,500 mg by mouth See admin instructions. Take 750 mg in the morning and 1500 mg in the PM 03/06/22  Yes [provider]  mirtazapine (REMERON) 15 MG tablet Take 15 mg by mouth at bedtime. 02/27/22  Yes [provider]  Multiple Vitamin (MULTIVITAMIN WITH MINERALS) TABS tablet Take 1 tablet by mouth daily. Fish oil   Yes [provider]  Multiple Vitamins-Minerals (PRESERVISION AREDS 2 PO) Take 1 tablet by mouth 2 (two) times daily. [employer], Historical, MD  Omega-3 Fatty Acids (OMEGA-3 FISH OIL PO) Take 1 capsule by mouth daily.   Yes [provider]  potassium chloride SA (KLOR-CON M20) 20 MEQ tablet Take 1 tablet by mouth daily.   Yes [provider]  pravastatin (PRAVACHOL) 20 MG tablet Take 20 mg by mouth at bedtime.   Yes [provider]  sucralfate (CARAFATE) 1 g tablet Take 1 g by mouth with breakfast, with lunch, and with  evening meal.   Yes [provider]  Vitamin E 400 units TABS Take 400 Units by mouth daily. 07/14/21  Yes [provider]    Physical Exam: Vitals:   01/15/24 2355 01/16/24 0001 01/16/24 0100 01/16/24 0200  BP: (!) 157/103 (!) 156/104 (!) 143/81 (!) 133/92  Pulse: 97  (!) 113 96  Resp: (!) 27  (!) 27 19  Temp: (!) 97.5 F (36.4 C)     TempSrc: Oral     SpO2: 97%  95% 98%  Weight:      Height:       Physical Exam Constitutional:      Appearance: She is normal weight.  Cardiovascular:     Rate and Rhythm: Normal rate.     Heart sounds: Normal heart sounds.  Pulmonary:     Effort: Pulmonary effort is normal.  Abdominal:  General: Bowel sounds are normal.  Musculoskeletal:        General: Normal range of motion.     Cervical back: Normal range of motion.  Skin:    General: Skin is warm.     Capillary Refill: Capillary refill takes less than 2 seconds.  Neurological:     General: No focal deficit present.     Mental Status: She is alert.  Psychiatric:        Mood and Affect: Mood normal.      Labs on Admission: I have personally reviewed the patients's labs and imaging studies.  Assessment/Plan Principal Problem:   Community acquired bacterial pneumonia   # Bacterial community acquired pneumonia, POA, active - Patient presented shortness of breath and cough found to have opacities on chest x-ray consistent with pneumonia - Started on ceftriaxone azithromycin in the emergency department  Plan: RT eval and treat Ceftriaxone and azithromycin  # Hypertension-continue labetalol  # Insomnia-continue Remeron  # Hyperlipidemia-continue statin  # Paroxysmal A-fib-continue amiodarone, Eliquis  #History of diastolic heart failure-patient not in exacerbation.  Will continue spironolactone  Admission status: Inpatient Telemetry Medical  Certification: The appropriate patient status for this patient is INPATIENT. Inpatient status is judged to be  reasonable and necessary in order to provide the required intensity of service to ensure the patient's safety. The patient's presenting symptoms, physical exam findings, and initial radiographic and laboratory data in the context of their chronic comorbidities is felt to place them at high risk for further clinical deterioration. Furthermore, it is not anticipated that the patient will be medically stable for discharge from the hospital within 2 midnights of admission.   * I certify that at the point of admission it is my clinical judgment that the patient will require inpatient hospital care spanning beyond 2 midnights from the point of admission due to high intensity of service, high risk for further deterioration and high frequency of surveillance required.Alan Mulder MD [facility] If 7PM-7AM, please contact night-coverage [email or web address]  01/16/2024, 2:47 AM

## 2024-01-16 NOTE — Assessment & Plan Note (Addendum)
 01-16-2024 start IV cardizem gtts. Will update echo. Has moderately elevated pulm pressure in 09-2022. Get K>4 and Mg >2.0 with repletion. 01-17-2024 HR must better controlled today. Stop IV cardizem gtts. Restart po meds. Continue eliquis.  I think her CHF likely due to rapid afib that was driven by her viral pneumonia. Keep K >4.0 and Mg >2.0. continue amiodarone.  01-18-2024 resting HR has improved but still in rapid afib with activity(HR 120-140s).  According to cardiology note from 12-27-2023, pt was suppose to reload with po amiodarone. Communication mix up between office, family and written Rx.  Family only gave her 400 mg amio daily(instead of bid).  Discussed risks/benefits with pt, dtr, son-in-law(who is ER physician). Shared decision making.  Have decided to load her properly with IV amiodarone. Will stop labetalol. Cardizem-CD increased to 300 mg daily today. Continue with eliquis. Keep K>4 and Mg >2.  If pt still with uncontrolled HR with activity by Monday, will consult cardiology for additional assistance.  01-19-2024 will complete 24 hour of IV loading of amiodarone. Had IV infiltrate last night. Don't want to place picc line. Will change to po amio 400 mg bid.  Give 5 mg IV lopressor.  Any sort of physical activity such as standing up to use BSC send her back into rapid afib.  Pt may need cardioversion. May need cards to see her tomorrow. Continue with Eliquis Bid.

## 2024-01-16 NOTE — Assessment & Plan Note (Signed)
 Estimated body mass index is 35.34 kg/m as calculated from the following:   Height as of this encounter: 5\' 4"  (1.626 m).   Weight as of this encounter: 93.4 kg.

## 2024-01-16 NOTE — ED Notes (Signed)
 Patient transported to CT

## 2024-01-16 NOTE — Assessment & Plan Note (Addendum)
 01-16-2024 CT chest almost that same as it was Jan 2024.  Continue with Rocephin/zithromax. Prn duonebs. 01-17-2024 procal <0.10.  may be viral pneumonia she had on admission. Flu/covid/rsv were negative. Repeat CXR in AM.  01-18-2024 I think pt has viral pneumonia. Will stop ABX. No fevers.

## 2024-01-16 NOTE — Subjective & Objective (Addendum)
 Pt was not in the room when I saw Dtr Pat at bedside. I spoke with dtr Dennie Bible and came back to room after pt came back from thoracentesis.  Pt seen and examined. Pt had 700 ml right side thoracentesis today by IR. Pt breathing much better.

## 2024-01-17 ENCOUNTER — Inpatient Hospital Stay (HOSPITAL_COMMUNITY): Payer: Medicare Other

## 2024-01-17 DIAGNOSIS — I4891 Unspecified atrial fibrillation: Secondary | ICD-10-CM | POA: Diagnosis not present

## 2024-01-17 DIAGNOSIS — I5033 Acute on chronic diastolic (congestive) heart failure: Secondary | ICD-10-CM | POA: Diagnosis not present

## 2024-01-17 DIAGNOSIS — I5031 Acute diastolic (congestive) heart failure: Secondary | ICD-10-CM

## 2024-01-17 DIAGNOSIS — J189 Pneumonia, unspecified organism: Secondary | ICD-10-CM | POA: Diagnosis not present

## 2024-01-17 DIAGNOSIS — J449 Chronic obstructive pulmonary disease, unspecified: Secondary | ICD-10-CM | POA: Diagnosis not present

## 2024-01-17 LAB — ECHOCARDIOGRAM COMPLETE
AR max vel: 1.9 cm2
AV Area VTI: 2.13 cm2
AV Area mean vel: 1.87 cm2
AV Mean grad: 2.7 mm[Hg]
AV Peak grad: 4.1 mm[Hg]
Ao pk vel: 1.01 m/s
Area-P 1/2: 4.68 cm2
Calc EF: 59 %
Est EF: 55
Height: 64 in
MV VTI: 3.83 cm2
S' Lateral: 3.6 cm
Single Plane A2C EF: 56.8 %
Single Plane A4C EF: 58.2 %
Weight: 3294.55 [oz_av]

## 2024-01-17 LAB — COMPREHENSIVE METABOLIC PANEL
ALT: 20 U/L (ref 0–44)
AST: 16 U/L (ref 15–41)
Albumin: 3.4 g/dL — ABNORMAL LOW (ref 3.5–5.0)
Alkaline Phosphatase: 60 U/L (ref 38–126)
Anion gap: 11 (ref 5–15)
BUN: 10 mg/dL (ref 8–23)
CO2: 29 mmol/L (ref 22–32)
Calcium: 9.2 mg/dL (ref 8.9–10.3)
Chloride: 100 mmol/L (ref 98–111)
Creatinine, Ser: 0.91 mg/dL (ref 0.44–1.00)
GFR, Estimated: >60 mL/min (ref >60)
Glucose, Bld: 122 mg/dL — ABNORMAL HIGH (ref 70–99)
Potassium: 3.5 mmol/L (ref 3.5–5.1)
Sodium: 140 mmol/L (ref 135–145)
Total Bilirubin: 0.5 mg/dL (ref 0.0–1.2)
Total Protein: 5.8 g/dL — ABNORMAL LOW (ref 6.5–8.1)

## 2024-01-17 LAB — MAGNESIUM: Magnesium: 2 mg/dL (ref 1.7–2.4)

## 2024-01-17 LAB — CBC WITH DIFFERENTIAL/PLATELET
Abs Immature Granulocytes: 0.04 10*3/uL (ref 0.00–0.07)
Basophils Absolute: 0.1 10*3/uL (ref 0.0–0.1)
Basophils Relative: 1 %
Eosinophils Absolute: 0.3 10*3/uL (ref 0.0–0.5)
Eosinophils Relative: 3 %
HCT: 35.6 % — ABNORMAL LOW (ref 36.0–46.0)
Hemoglobin: 11.5 g/dL — ABNORMAL LOW (ref 12.0–15.0)
Immature Granulocytes: 0 %
Lymphocytes Relative: 15 %
Lymphs Abs: 1.4 10*3/uL (ref 0.7–4.0)
MCH: 28.5 pg (ref 26.0–34.0)
MCHC: 32.3 g/dL (ref 30.0–36.0)
MCV: 88.3 fL (ref 80.0–100.0)
Monocytes Absolute: 0.8 10*3/uL (ref 0.1–1.0)
Monocytes Relative: 9 %
Neutro Abs: 6.6 10*3/uL (ref 1.7–7.7)
Neutrophils Relative %: 72 %
Platelets: 230 10*3/uL (ref 150–400)
RBC: 4.03 MIL/uL (ref 3.87–5.11)
RDW: 14.5 % (ref 11.5–15.5)
WBC: 9.2 10*3/uL (ref 4.0–10.5)
nRBC: 0 % (ref 0.0–0.2)

## 2024-01-17 LAB — PROCALCITONIN: Procalcitonin: <0.1 ng/mL

## 2024-01-17 MED ORDER — DILTIAZEM HCL ER COATED BEADS 240 MG PO CP24
240.0000 mg | ORAL_CAPSULE | Freq: Every day | ORAL | Status: DC
  Administered 2024-01-17: 240 mg via ORAL
  Filled 2024-01-17: qty 1

## 2024-01-17 MED ORDER — ALUM & MAG HYDROXIDE-SIMETH 200-200-20 MG/5ML PO SUSP
30.0000 mL | Freq: Four times a day (QID) | ORAL | Status: DC | PRN
  Administered 2024-01-17 – 2024-01-21 (×3): 30 mL via ORAL
  Filled 2024-01-17 (×3): qty 30

## 2024-01-17 MED ORDER — POTASSIUM CHLORIDE CRYS ER 20 MEQ PO TBCR
40.0000 meq | EXTENDED_RELEASE_TABLET | ORAL | Status: AC
  Administered 2024-01-17 (×3): 40 meq via ORAL
  Filled 2024-01-17 (×3): qty 2

## 2024-01-17 MED ORDER — FUROSEMIDE 40 MG PO TABS
40.0000 mg | ORAL_TABLET | Freq: Every day | ORAL | Status: DC
  Administered 2024-01-18 – 2024-01-19 (×2): 40 mg via ORAL
  Filled 2024-01-17 (×2): qty 1

## 2024-01-17 NOTE — TOC Initial Note (Signed)
 Transition of Care [facility]) - Initial/Assessment Note    Patient Details  Name: [name] MRN: [id number] Date of Birth: October 28, 1933  [facility]) CM/SW Contact:    Gala Lewandowsky, RN Phone Number: 01/17/2024, 4:34 PM  Clinical Narrative: Patient presented for chest pain and shortness of breath. PTA patient was from home with daughter and son-in-law. Patient has DME rolling walker, wheelcahir, bedside commode, and shower chair. Daughter in the room during the visit and she takes the patient to all appointments. Case Manager will continue to follow for transition of care needs as the patient progresses.                  Expected Discharge Plan: Home/Self Care Barriers to Discharge: Continued Medical Work up  Patient Goals and CMS Choice Patient states their goals for this hospitalization and ongoing recovery are:: to return to home once stable  Expected Discharge Plan and Services In-house Referral: NA Discharge Planning Services: CM Consult   Living arrangements for the past 2 months: Single Family Home   Prior Living Arrangements/Services Living arrangements for the past 2 months: Single Family Home Lives with:: Adult Children Patient language and need for interpreter reviewed:: Yes Do you feel safe going back to the place where you live?: Yes      Need for Family Participation in Patient Care: Yes (Comment) Care giver support system in place?: Yes (comment) Current home services: DME (rolling walker, wheelcahir, bedside commode, shower chair.) Criminal Activity/Legal Involvement Pertinent to Current Situation/Hospitalization: No - Comment as needed  Activities of Daily Living   ADL Screening (condition at time of admission) Independently performs ADLs?: No Does the patient have a NEW difficulty with bathing/dressing/toileting/self-feeding that is expected to last >3 days?: No (needs assist) Does the patient have a NEW difficulty with getting in/out  of bed, walking, or climbing stairs that is expected to last >3 days?: No (needs assist up with walker) Does the patient have a NEW difficulty with communication that is expected to last >3 days?: No Is the patient deaf or have difficulty hearing?: Yes (wears hearing aids not here) Does the patient have difficulty seeing, even when wearing glasses/contacts?: No Does the patient have difficulty concentrating, remembering, or making decisions?: No  Permission Sought/Granted Permission sought to share information with : Family Supports, Case Manager   Emotional Assessment Appearance:: Appears stated age Attitude/Demeanor/Rapport: Engaged Affect (typically observed): Appropriate Orientation: : Oriented to Self, Oriented to Place, Oriented to  Time Alcohol / Substance Use: Not Applicable Psych Involvement: No (comment)  Admission diagnosis:  Shortness of breath [R06.02] Peripheral edema [R60.0] Community acquired bacterial pneumonia [J15.9] CAP (community acquired pneumonia) [J18.9] Pneumonia due to infectious organism, unspecified laterality, unspecified part of lung [J18.9] Patient Active Problem List   Diagnosis Date Noted   CAP (community acquired pneumonia) 01/16/2024   Obesity, Class II, BMI 35-39.9 01/16/2024   Chronic diastolic CHF (congestive heart failure) (HCC) 01/16/2024   Acute on chronic diastolic CHF (congestive heart failure) (HCC) 01/16/2024   GERD (gastroesophageal reflux disease) 01/14/2023   Closed fracture of right fibula and tibia 01/13/2023   Atrial fibrillation with RVR (HCC) 11/27/2022   COPD (chronic obstructive pulmonary disease) (HCC) 11/24/2022   Essential hypertension 11/24/2022   Hyperlipidemia 11/24/2022   S/P left THA, 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   PAF (paroxysmal atrial fibrillation) (HCC) 07/21/2020   PCP:  Hadley Pen, MD Pharmacy:   [facility] [phone] - [place], [place] - [address] AT [facility] [address] [place] [address] Phone: [phone] Fax: [phone]  Social Drivers of Health (SDOH) Social History: SDOH Screenings   Food Insecurity: No Food Insecurity (01/16/2024)  Housing: Low Risk  (01/16/2024)  Transportation Needs: No Transportation Needs (01/16/2024)  Utilities: Not At Risk (01/16/2024)  Social Connections: Socially Isolated (01/16/2024)  Tobacco Use: Low Risk  (01/15/2024)   Readmission Risk Interventions     No data to display

## 2024-01-17 NOTE — Progress Notes (Signed)
 PROGRESS NOTE    [name]  NFA:[id number] DOB: [birth year] DOA: 01/15/2024 PCP: Hadley Pen, MD  Subjective: Pt seen and examined. Dtr Dennie Bible at bedside. Pt looks a lot better today. Dtr agrees. Pt's breathing has improved.  HR controlled on cardizem gtts. Not much diuresis from IV lasix. On RA. No further wheezing.  Dtr states pt's LE edema is stable today and pt's leg look better than they normally do at home.  Pt has severe OA of right knee. Pt is not a surgical candidate. Pt ambulates minimally at home. Pt's son-in-law is a ER physician.   Hospital Course: HPI:  [name] is a 89 y.o. female with medical history hypertension, prior stroke, hyperlipidemia, GERD who presents emerged part with shortness of breath and chest pain.  Patient states that she has had 3 days of progressively worsening shortness of breath and discomfort with breathing.  She presented to the ER where she was found to be afebrile hemodynamically able.  Obtained on presentation which showed BMP unrevealing, WBC 10.5, hemoglobin 12.5, troponin 11, 11.  BNP 202.  Respiratory viral panel was negative for infection.  Patient underwent chest x-ray which demonstrated right upper lobe opacity concerning for infection.  Patient was started on ceftriaxone and azithromycin admitted further workup.   Significant Events: Admitted 01/15/2024 for RUL pneumonia   Significant Labs: WBC 10.5, HgB 12.5, plt 278 Na 140, K 3.8, CO2 of 28, BUN 14, scr 0.95 BNP 202  Significant Imaging Studies: CXR Right upper lobe airspace opacity is again noted concerning for pneumonia. Small right pleural effusion is noted with adjacent subsegmental atelectasis.  Antibiotic Therapy: Anti-infectives (From admission, onward)    Start     Dose/Rate Route Frequency Ordered Stop   01/16/24 1000  cefTRIAXone (ROCEPHIN) 2 g in sodium chloride 0.9 % 100 mL IVPB        2 g 200 mL/hr over 30 Minutes Intravenous Every 24 hours 01/16/24 0250      01/16/24 1000  azithromycin (ZITHROMAX) tablet 500 mg        500 mg Oral Daily 01/16/24 0250     01/15/24 2015  cefTRIAXone (ROCEPHIN) 1 g in sodium chloride 0.9 % 100 mL IVPB        1 g 200 mL/hr over 30 Minutes Intravenous  Once 01/15/24 2013 01/16/24 0000   01/15/24 2015  azithromycin (ZITHROMAX) 500 mg in sodium chloride 0.9 % 250 mL IVPB        500 mg 250 mL/hr over 60 Minutes Intravenous  Once 01/15/24 2013 01/16/24 0000       Procedures:   Consultants:     Assessment and Plan: * CAP (community acquired pneumonia) 01-16-2024 CT chest almost that same as it was Jan 2024.  Continue with Rocephin/zithromax. Prn duonebs.  01-17-2024 procal <0.10.  may be viral pneumonia she had on admission. Flu/covid/rsv were negative. Repeat CXR in AM.  Acute on chronic diastolic CHF (congestive heart failure) (HCC) 01-16-2024 will start IV lasix 40 mg q12h. Update echo.  01-17-2024 LVEF 55%. Still stop IV lasix. Change back to po lasix 40 mg daily.  Atrial fibrillation with RVR (HCC) 01-16-2024 start IV cardizem gtts. Will update echo. Has moderately elevated pulm pressure in 09-2022. Get K>4 and Mg >2.0 with repletion.  01-17-2024 HR must better controlled today. Stop IV cardizem gtts. Restart po meds. Continue eliquis.  I think her CHF likely due to rapid afib that was driven by her viral pneumonia. Keep K >4.0 and Mg >2.0.  continue amiodarone.  Chronic diastolic CHF (congestive heart failure) (HCC) 01-16-2024 last echo 09-2022 LVEF 60%. Will update echo.  01-17-2024 echo shows LVEF 55%  Obesity, Class II, BMI 35-39.9 Estimated body mass index is 35.34 kg/m as calculated from the following:   Height as of this encounter: 5\' 4"  (1.626 m).   Weight as of this encounter: 93.4 kg.   Essential hypertension 01-16-2024 on cardizem gtts. Being used for rapid afib but will also help her BP.  Continue with IV lasix.  01-17-2024 stop IV cardizem gtts. Restart cardizem CD. Continue  labetalol.  COPD (chronic obstructive pulmonary disease) (HCC) 01-16-2024 not exacerbated at this point I don't think. No indication for IV steroids for now. Continue with prn nebs. I think once she is diuresed her breathing will improve.  01-17-2024 her wheezing has improved. Does not need steroids.  DVT prophylaxis: SCDs Start: 01/15/24 2210 apixaban (ELIQUIS) tablet 5 mg     Code Status: Full Code Family Communication: discussed with pt's dtr Pat at bedside Disposition Plan: return home Reason for continuing need for hospitalization: transitioning from IV cardizem to po cardizem.  Objective: Vitals:   01/16/24 2320 01/17/24 0320 01/17/24 0724 01/17/24 1141  BP: 113/67 136/84 107/71 95/65  Pulse: 96 98 78 75  Resp: 19 17 15  (!) 26  Temp: 97.9 F (36.6 C) (!) 97.5 F (36.4 C)  (!) 97.5 F (36.4 C)  TempSrc: Axillary Axillary  Oral  SpO2: 98% 95% 99% 93%  Weight:      Height:        Intake/Output Summary (Last 24 hours) at 01/17/2024 1412 Last data filed at 01/17/2024 0900 Gross per 24 hour  Intake 692.86 ml  Output 250 ml  Net 442.86 ml   Filed Weights   01/15/24 1654  Weight: 93.4 kg   Examination:  Physical Exam Vitals and nursing note reviewed.  Constitutional:      General: She is not in acute distress.    Appearance: She is obese. She is not toxic-appearing or diaphoretic.  HENT:     Head: Normocephalic and atraumatic.  Eyes:     General: No scleral icterus. Cardiovascular:     Rate and Rhythm: Normal rate. Rhythm irregular.  Pulmonary:     Effort: Pulmonary effort is normal. No respiratory distress.     Breath sounds: Normal breath sounds.  Abdominal:     General: Bowel sounds are normal. There is no distension.     Palpations: Abdomen is soft.  Musculoskeletal:     Right lower leg: Edema present.     Left lower leg: Edema present.     Comments: Trace pretibial/ankle edema. Dtr Dennie Bible at bedside states that today's ankle swelling is better than it  normally is at home.  Skin:    General: Skin is warm and dry.     Capillary Refill: Capillary refill takes less than 2 seconds.  Neurological:     General: No focal deficit present.     Mental Status: She is alert.   Data Reviewed: I have personally reviewed following labs and imaging studies  CBC: Recent Labs  Lab 01/15/24 1719 01/17/24 0504  WBC 10.5 9.2  NEUTROABS  --  6.6  HGB 12.5 11.5*  HCT 39.3 35.6*  MCV 88.7 88.3  PLT 278 230   Basic Metabolic Panel: Recent Labs  Lab 01/15/24 1719 01/17/24 0504  NA 140 140  K 3.8 3.5  CL 102 100  CO2 28 29  GLUCOSE 103* 122*  BUN  14 10  CREATININE 0.95 0.91  CALCIUM 10.1 9.2  MG  --  2.0   GFR: Estimated Creatinine Clearance: 47.4 mL/min (by C-G formula based on SCr of 0.91 mg/dL). Liver Function Tests: Recent Labs  Lab 01/17/24 0504  AST 16  ALT 20  ALKPHOS 60  BILITOT 0.5  PROT 5.8*  ALBUMIN 3.4*   BNP (last 3 results) Recent Labs    12/27/23 1001 01/15/24 1720  BNP 293.3* 202.5*   Sepsis Labs: Recent Labs  Lab 01/15/24 2110 01/17/24 0504  PROCALCITON  --  <0.10  LATICACIDVEN 0.8  --     Recent Results (from the past 240 hours)  Resp panel by RT-PCR (RSV, Flu A&B, Covid) Anterior Nasal Swab     Status: None   Collection Time: 01/15/24  8:12 PM   Specimen: Anterior Nasal Swab  Result Value Ref Range Status   SARS Coronavirus 2 by RT PCR NEGATIVE NEGATIVE Final   Influenza A by PCR NEGATIVE NEGATIVE Final   Influenza B by PCR NEGATIVE NEGATIVE Final    Comment: (NOTE) The Xpert Xpress SARS-CoV-2/FLU/RSV plus assay is intended as an aid in the diagnosis of influenza from Nasopharyngeal swab specimens and should not be used as a sole basis for treatment. Nasal washings and aspirates are unacceptable for Xpert Xpress SARS-CoV-2/FLU/RSV testing.  Fact Sheet for Patients: BloggerCourse.com  Fact Sheet for Healthcare  Providers: SeriousBroker.it  This test is not yet approved or cleared by the [place] FDA and has been authorized for detection and/or diagnosis of SARS-CoV-2 by FDA under an Emergency Use Authorization (EUA). This EUA will remain in effect (meaning this test can be used) for the duration of the COVID-19 declaration under Section 564(b)(1) of the Act, 21 U.S.C. section 360bbb-3(b)(1), unless the authorization is terminated or revoked.     Resp Syncytial Virus by PCR NEGATIVE NEGATIVE Final    Comment: (NOTE) Fact Sheet for Patients: BloggerCourse.com  Fact Sheet for Healthcare Providers: SeriousBroker.it  This test is not yet approved or cleared by the [place] FDA and has been authorized for detection and/or diagnosis of SARS-CoV-2 by FDA under an Emergency Use Authorization (EUA). This EUA will remain in effect (meaning this test can be used) for the duration of the COVID-19 declaration under Section 564(b)(1) of the Act, 21 U.S.C. section 360bbb-3(b)(1), unless the authorization is terminated or revoked.  Performed at [facility], [address] [place], [place] [address]   Blood culture (routine x 2)     Status: None (Preliminary result)   Collection Time: 01/15/24  8:40 PM   Specimen: BLOOD  Result Value Ref Range Status   Specimen Description BLOOD LEFT ANTECUBITAL  Final   Special Requests   Final    BOTTLES DRAWN AEROBIC AND ANAEROBIC Blood Culture results may not be optimal due to an inadequate volume of blood received in culture bottles   Culture   Final    NO GROWTH 2 DAYS Performed at [facility], [address], [place], [place] [address]    Report Status PENDING  Incomplete  Blood culture (routine x 2)     Status: None (Preliminary result)   Collection Time: 01/15/24  8:40 PM   Specimen: BLOOD RIGHT HAND  Result Value Ref Range Status   Specimen Description  BLOOD RIGHT HAND  Final   Special Requests   Final    BOTTLES DRAWN AEROBIC AND ANAEROBIC Blood Culture results may not be optimal due to an inadequate volume  of blood received in culture bottles   Culture   Final    NO GROWTH 2 DAYS Performed at [facility], [address] [place], [place] [address]    Report Status PENDING  Incomplete     Radiology Studies: ECHOCARDIOGRAM COMPLETE Result Date: 01/17/2024    ECHOCARDIOGRAM REPORT   Patient Name:   LOCKIE BOTHUN Date of Exam: 01/17/2024 Medical Rec #:  [id number]      Height:       64.0 in Accession #:    [id number]     Weight:       205.9 lb Date of Birth:  12/02/1932      BSA:          1.981 m Patient Age:    89 years       BP:           107/71 mmHg Patient Gender: F              HR:           77 bpm. Exam Location:  Inpatient Procedure: 2D Echo, Cardiac Doppler and Color Doppler (Both Spectral and Color            Flow Doppler were utilized during procedure). Indications:    CHF-acute diastolic  History:        Patient has prior history of Echocardiogram examinations, most                 recent 09/27/2022. COPD, Arrythmias:Atrial Fibrillation,                 Signs/Symptoms:Dyspnea; Risk Factors:Hypertension and                 Dyslipidemia.  Sonographer:    Vern Claude Referring Phys: [id number] Mekaylah Klich IMPRESSIONS  1. Left ventricular ejection fraction, by estimation, is 55%. The left ventricle has normal function. Left ventricular endocardial border not optimally defined to evaluate regional wall motion. Left ventricular diastolic parameters are indeterminate.  2. Right ventricular systolic function is normal. The right ventricular size is not well visualized. There is mildly elevated pulmonary artery systolic pressure. The estimated right ventricular systolic pressure is 37.6 mmHg.  3. Left atrial size was severely dilated.  4. Right atrial size was mildly dilated.  5. The mitral valve is normal in structure. Trivial mitral valve  regurgitation. No evidence of mitral stenosis. Moderate mitral annular calcification.  6. The aortic valve is tricuspid. There is mild calcification of the aortic valve. Aortic valve regurgitation is not visualized. No aortic stenosis is present.  7. Aortic dilatation noted. There is mild dilatation of the aortic root, measuring 39 mm. There is mild dilatation of the ascending aorta, measuring 39 mm.  8. The inferior vena cava is normal in size with greater than 50% respiratory variability, suggesting right atrial pressure of 3 mmHg.  9. The patient was in atrial fibrillation. FINDINGS  Left Ventricle: Left ventricular ejection fraction, by estimation, is 55%. The left ventricle has normal function. Left ventricular endocardial border not optimally defined to evaluate regional wall motion. The left ventricular internal cavity size was normal in size. There is no left ventricular hypertrophy. Left ventricular diastolic parameters are indeterminate. Right Ventricle: The right ventricular size is not well visualized. Right vetricular wall thickness was not well visualized. Right ventricular systolic function is normal. There is mildly elevated pulmonary artery systolic pressure. The tricuspid regurgitant velocity is 2.94 m/s, and with an assumed right atrial pressure of 3  mmHg, the estimated right ventricular systolic pressure is 37.6 mmHg. Left Atrium: Left atrial size was severely dilated. Right Atrium: Right atrial size was mildly dilated. Pericardium: There is no evidence of pericardial effusion. Mitral Valve: The mitral valve is normal in structure. Moderate mitral annular calcification. Trivial mitral valve regurgitation. No evidence of mitral valve stenosis. MV peak gradient, 3.9 mmHg. The mean mitral valve gradient is 2.0 mmHg. Tricuspid Valve: The tricuspid valve is normal in structure. Tricuspid valve regurgitation is mild. Aortic Valve: The aortic valve is tricuspid. There is mild calcification of the aortic  valve. Aortic valve regurgitation is not visualized. No aortic stenosis is present. Aortic valve mean gradient measures 2.7 mmHg. Aortic valve peak gradient measures 4.1 mmHg. Aortic valve area, by VTI measures 2.13 cm. Pulmonic Valve: The pulmonic valve was normal in structure. Pulmonic valve regurgitation is not visualized. Aorta: Aortic dilatation noted. There is mild dilatation of the aortic root, measuring 39 mm. There is mild dilatation of the ascending aorta, measuring 39 mm. Venous: The inferior vena cava is normal in size with greater than 50% respiratory variability, suggesting right atrial pressure of 3 mmHg. IAS/Shunts: No atrial level shunt detected by color flow Doppler.  LEFT VENTRICLE PLAX 2D LVIDd:         5.20 cm LVIDs:         3.60 cm LV PW:         0.80 cm LV IVS:        0.80 cm LVOT diam:     2.00 cm LV SV:         35 LV SV Index:   18 LVOT Area:     3.14 cm  LV Volumes (MOD) LV vol d, MOD A2C: 90.8 ml LV vol d, MOD A4C: 110.0 ml LV vol s, MOD A2C: 39.2 ml LV vol s, MOD A4C: 46.0 ml LV SV MOD A2C:     51.6 ml LV SV MOD A4C:     110.0 ml LV SV MOD BP:      60.8 ml RIGHT VENTRICLE             IVC RV Basal diam:  3.40 cm     IVC diam: 1.60 cm RV Mid diam:    2.00 cm RV S prime:     11.10 cm/s LEFT ATRIUM              Index        RIGHT ATRIUM           Index LA diam:        5.40 cm  2.73 cm/m   RA Area:     17.80 cm LA Vol (A2C):   114.0 ml 57.55 ml/m  RA Volume:   46.50 ml  23.48 ml/m LA Vol (A4C):   97.2 ml  49.07 ml/m LA Biplane Vol: 108.0 ml 54.53 ml/m  AORTIC VALVE                    PULMONIC VALVE AV Area (Vmax):    1.90 cm     PV Vmax:       0.90 m/s AV Area (Vmean):   1.87 cm     PV Peak grad:  3.2 mmHg AV Area (VTI):     2.13 cm AV Vmax:           101.10 cm/s AV Vmean:          72.133 cm/s AV VTI:  0.163 m AV Peak Grad:      4.1 mmHg AV Mean Grad:      2.7 mmHg LVOT Vmax:         61.25 cm/s LVOT Vmean:        42.850 cm/s LVOT VTI:          0.110 m LVOT/AV VTI ratio:  0.68  AORTA Ao Root diam: 3.90 cm Ao Asc diam:  3.90 cm MITRAL VALVE               TRICUSPID VALVE MV Area (PHT): 4.68 cm    TR Peak grad:   34.6 mmHg MV Area VTI:   3.83 cm    TR Vmax:        294.00 cm/s MV Peak grad:  3.9 mmHg MV Mean grad:  2.0 mmHg    SHUNTS MV Vmax:       0.98 m/s    Systemic VTI:  0.11 m MV Vmean:      57.2 cm/s   Systemic Diam: 2.00 cm MV Decel Time: 162 msec MV E velocity: 90.00 cm/s Dalton McleanMD Electronically signed by Wilfred Lacy Signature Date/Time: 01/17/2024/10:55:46 AM    Final    CT CHEST WO CONTRAST Result Date: 01/16/2024 CLINICAL DATA:  Pneumonia, complication suspected, xray done EXAM: CT CHEST WITHOUT CONTRAST TECHNIQUE: Multidetector CT imaging of the chest was performed following the standard protocol without IV contrast. RADIATION DOSE REDUCTION: This exam was performed according to the departmental dose-optimization program which includes automated exposure control, adjustment of the mA and/or kV according to patient size and/or use of iterative reconstruction technique. COMPARISON:  X-ray 01/15/2024, CT 11/25/2022 FINDINGS: Cardiovascular: Heart size is upper limits of normal. Trace pericardial fluid. Thoracic aorta is nonaneurysmal. Scattered atherosclerotic vascular calcifications of the aorta and coronary arteries. Central pulmonary vasculature remains mildly dilated. Mediastinum/Nodes: Multiple calcified mediastinal and right hilar lymph nodes without evidence of new or progressive adenopathy. No axillary lymphadenopathy. Trachea and esophagus within normal limits. Lungs/Pleura: Small-moderate sized right-sided pleural effusion, increased. Dependent right basilar atelectasis and patchy airspace opacity within the more superior aspect of the right lower lobe. Additionally, there is interlobular septal thickening and patchy ground-glass attenuation throughout the right upper lobe. Left lung is clear. No pneumothorax. Upper Abdomen: 14 mm left adrenal gland  nodule with internal density of 13 HU. No acute abnormality. Musculoskeletal: No new or acute bony or chest wall abnormality. IMPRESSION: 1. Small-moderate sized right-sided pleural effusion, increased. 2. Dependent right basilar atelectasis and patchy airspace opacity within the more superior aspect of the right lower lobe. Additionally, there is interlobular septal thickening and patchy ground-glass attenuation throughout the right upper lobe. Findings may represent a combination of asymmetric edema with atelectasis and pneumonia. 3. Incidentally noted 14 mm left adrenal gland nodule, likely a benign adenoma. 4. Aortic and coronary artery atherosclerosis (ICD10-I70.0). Electronically Signed   By: Duanne Guess D.O.   On: 01/16/2024 10:46   DG Chest 2 View Result Date: 01/15/2024 CLINICAL DATA:  Chest pain, dyspnea with exertion. EXAM: CHEST - 2 VIEW COMPARISON:  January 13, 2023. FINDINGS: Stable cardiomediastinal silhouette. Small right pleural effusion is noted with adjacent subsegmental atelectasis. Right upper lobe airspace opacity is noted concerning for pneumonia. Left lung is clear. Bony thorax is unremarkable. IMPRESSION: Right upper lobe airspace opacity is again noted concerning for pneumonia. Small right pleural effusion is noted with adjacent subsegmental atelectasis. Electronically Signed   By: Lupita Raider M.D.   On: 01/15/2024 18:22  Scheduled Meds:  amiodarone  200 mg Oral Daily   apixaban  5 mg Oral BID   azithromycin  500 mg Oral Daily   diltiazem  240 mg Oral Daily   [START ON 01/18/2024] furosemide  40 mg Oral Daily   gabapentin  300 mg Oral Daily   And   gabapentin  600 mg Oral Daily   labetalol  200 mg Oral BID   methocarbamol  750 mg Oral Daily   And   methocarbamol  1,500 mg Oral Daily   mirtazapine  15 mg Oral QHS   potassium chloride  40 mEq Oral Q4H   pravastatin  20 mg Oral QHS   Continuous Infusions:  cefTRIAXone (ROCEPHIN)  IV 2 g (01/17/24 1102)    LOS:  2 days   Time spent: 40 minutes  Carollee Herter, DO  [facility]  01/17/2024, 2:12 PM

## 2024-01-17 NOTE — Plan of Care (Signed)

## 2024-01-18 ENCOUNTER — Inpatient Hospital Stay (HOSPITAL_COMMUNITY)

## 2024-01-18 ENCOUNTER — Encounter (HOSPITAL_COMMUNITY): Payer: Self-pay | Admitting: Internal Medicine

## 2024-01-18 DIAGNOSIS — J189 Pneumonia, unspecified organism: Secondary | ICD-10-CM | POA: Diagnosis not present

## 2024-01-18 DIAGNOSIS — I5033 Acute on chronic diastolic (congestive) heart failure: Secondary | ICD-10-CM | POA: Diagnosis not present

## 2024-01-18 DIAGNOSIS — I5032 Chronic diastolic (congestive) heart failure: Secondary | ICD-10-CM

## 2024-01-18 DIAGNOSIS — J90 Pleural effusion, not elsewhere classified: Secondary | ICD-10-CM | POA: Diagnosis not present

## 2024-01-18 DIAGNOSIS — I4891 Unspecified atrial fibrillation: Secondary | ICD-10-CM | POA: Diagnosis not present

## 2024-01-18 LAB — COMPREHENSIVE METABOLIC PANEL
ALT: 20 U/L (ref 0–44)
AST: 13 U/L — ABNORMAL LOW (ref 15–41)
Albumin: 3.3 g/dL — ABNORMAL LOW (ref 3.5–5.0)
Alkaline Phosphatase: 57 U/L (ref 38–126)
Anion gap: 5 (ref 5–15)
BUN: 13 mg/dL (ref 8–23)
CO2: 28 mmol/L (ref 22–32)
Calcium: 9.1 mg/dL (ref 8.9–10.3)
Chloride: 107 mmol/L (ref 98–111)
Creatinine, Ser: 0.77 mg/dL (ref 0.44–1.00)
GFR, Estimated: >60 mL/min (ref >60)
Glucose, Bld: 118 mg/dL — ABNORMAL HIGH (ref 70–99)
Potassium: 4.9 mmol/L (ref 3.5–5.1)
Sodium: 140 mmol/L (ref 135–145)
Total Bilirubin: 0.3 mg/dL (ref 0.0–1.2)
Total Protein: 5.9 g/dL — ABNORMAL LOW (ref 6.5–8.1)

## 2024-01-18 LAB — CBC WITH DIFFERENTIAL/PLATELET
Abs Immature Granulocytes: 0.07 10*3/uL (ref 0.00–0.07)
Basophils Absolute: 0.1 10*3/uL (ref 0.0–0.1)
Basophils Relative: 1 %
Eosinophils Absolute: 0.4 10*3/uL (ref 0.0–0.5)
Eosinophils Relative: 4 %
HCT: 35.2 % — ABNORMAL LOW (ref 36.0–46.0)
Hemoglobin: 11 g/dL — ABNORMAL LOW (ref 12.0–15.0)
Immature Granulocytes: 1 %
Lymphocytes Relative: 16 %
Lymphs Abs: 1.5 10*3/uL (ref 0.7–4.0)
MCH: 28 pg (ref 26.0–34.0)
MCHC: 31.3 g/dL (ref 30.0–36.0)
MCV: 89.6 fL (ref 80.0–100.0)
Monocytes Absolute: 0.8 10*3/uL (ref 0.1–1.0)
Monocytes Relative: 9 %
Neutro Abs: 6.4 10*3/uL (ref 1.7–7.7)
Neutrophils Relative %: 69 %
Platelets: 226 10*3/uL (ref 150–400)
RBC: 3.93 MIL/uL (ref 3.87–5.11)
RDW: 14.4 % (ref 11.5–15.5)
WBC: 9.2 10*3/uL (ref 4.0–10.5)
nRBC: 0 % (ref 0.0–0.2)

## 2024-01-18 LAB — MAGNESIUM: Magnesium: 2.1 mg/dL (ref 1.7–2.4)

## 2024-01-18 MED ORDER — AMIODARONE HCL IN DEXTROSE 360-4.14 MG/200ML-% IV SOLN
60.0000 mg/h | INTRAVENOUS | Status: AC
  Administered 2024-01-18: 60 mg/h via INTRAVENOUS
  Filled 2024-01-18: qty 200

## 2024-01-18 MED ORDER — METOPROLOL TARTRATE 5 MG/5ML IV SOLN: 5.0000 mg | Freq: Four times a day (QID) | INTRAVENOUS | Status: DC | PRN

## 2024-01-18 MED ORDER — LABETALOL HCL 200 MG PO TABS
100.0000 mg | ORAL_TABLET | Freq: Two times a day (BID) | ORAL | Status: DC
  Administered 2024-01-18: 100 mg via ORAL
  Filled 2024-01-18: qty 1

## 2024-01-18 MED ORDER — DILTIAZEM HCL ER COATED BEADS 180 MG PO CP24
300.0000 mg | ORAL_CAPSULE | Freq: Every day | ORAL | Status: DC
  Administered 2024-01-18: 300 mg via ORAL
  Filled 2024-01-18: qty 1

## 2024-01-18 MED ORDER — HYALURONIDASE HUMAN 150 UNIT/ML IJ SOLN
150.0000 [IU] | Freq: Once | INTRAMUSCULAR | Status: AC
  Administered 2024-01-18: 150 [IU] via SUBCUTANEOUS
  Filled 2024-01-18: qty 1

## 2024-01-18 MED ORDER — AMIODARONE HCL IN DEXTROSE 360-4.14 MG/200ML-% IV SOLN
30.0000 mg/h | INTRAVENOUS | Status: AC
  Administered 2024-01-18: 60 mg/h via INTRAVENOUS
  Administered 2024-01-19: 30 mg/h via INTRAVENOUS
  Filled 2024-01-18 (×2): qty 200

## 2024-01-18 MED ORDER — AMIODARONE LOAD VIA INFUSION
150.0000 mg | Freq: Once | INTRAVENOUS | Status: AC
  Administered 2024-01-18: 150 mg via INTRAVENOUS
  Filled 2024-01-18: qty 83.34

## 2024-01-18 NOTE — Assessment & Plan Note (Signed)
 01-18-2024 pt has pleural effusion on CT chest. Bedside thoracic U/S performed that shows moderate right pleural effusion. Estimated about 500-750 ml. Discussed risks and benefits with pt, dtr and son-in-law(who is an ER physician). They agree to proceed with thoracentesis.  01-19-2024 had 700 ml thoracentesis performed today by IR.  Pt breathing much better.

## 2024-01-18 NOTE — Progress Notes (Signed)
 Mobility Specialist Progress Note;   01/18/24 1000  Mobility  Activity Transferred to/from [facility]  Level of Assistance Contact guard assist, steadying assist  Assistive Device Other (Comment) (HHA)  Distance Ambulated (ft) 3 ft  Activity Response Tolerated well  Mobility Referral Yes  Mobility visit 1 Mobility  Mobility Specialist Start Time (ACUTE ONLY) 1000  Mobility Specialist Stop Time (ACUTE ONLY) 1010  Mobility Specialist Time Calculation (min) (ACUTE ONLY) 10 min   Pt received on BSC, agreeable to mobility. Requested assistance back to chair. BM successful, pericare performed on pt. Required MinG assistance via HHA to safely transfer pt from [facility] to chair. VSS throughout. Pt left comfortably in chair with all needs met, alarm on. Daughter in room.   Caesar Bookman Mobility Specialist Please contact via SecureChat or [employer] [phone]

## 2024-01-18 NOTE — Progress Notes (Signed)
 PROGRESS NOTE    [name]  NGE:[id number] DOB: 1933-08-14 DOA: 01/15/2024 PCP: Katelyn Pen, MD  Subjective: Pt seen and examined. Dtr [name] at bedside. Pt looks a lot better today. Dtr agrees. Pt's breathing has improved.  HR controlled on cardizem gtts. Not much diuresis from IV lasix. On RA. No further wheezing.  Dtr states pt's LE edema is stable today and pt's leg look better than they normally do at home.  Pt has severe OA of right knee. Pt is not a surgical candidate. Pt ambulates minimally at home. Pt's son-in-law is a ER physician.   Hospital Course: HPI:  [name] is a 89 y.o. female with medical history hypertension, prior stroke, hyperlipidemia, GERD who presents emerged part with shortness of breath and chest pain.  Patient states that she has had 3 days of progressively worsening shortness of breath and discomfort with breathing.  She presented to the ER where she was found to be afebrile hemodynamically able.  Obtained on presentation which showed BMP unrevealing, WBC 10.5, hemoglobin 12.5, troponin 11, 11.  BNP 202.  Respiratory viral panel was negative for infection.  Patient underwent chest x-ray which demonstrated right upper lobe opacity concerning for infection.  Patient was started on ceftriaxone and azithromycin admitted further workup.   Significant Events: Admitted 01/15/2024 for RUL pneumonia   Significant Labs: WBC 10.5, HgB 12.5, plt 278 Na 140, K 3.8, CO2 of 28, BUN 14, scr 0.95 BNP 202  Significant Imaging Studies: CXR Right upper lobe airspace opacity is again noted concerning for pneumonia. Small right pleural effusion is noted with adjacent subsegmental atelectasis.  Antibiotic Therapy: Anti-infectives (From admission, onward)    Start     Dose/Rate Route Frequency Ordered Stop   01/16/24 1000  cefTRIAXone (ROCEPHIN) 2 g in sodium chloride 0.9 % 100 mL IVPB        2 g 200 mL/hr over 30 Minutes Intravenous Every 24 hours 01/16/24 0250      01/16/24 1000  azithromycin (ZITHROMAX) tablet 500 mg        500 mg Oral Daily 01/16/24 0250     01/15/24 2015  cefTRIAXone (ROCEPHIN) 1 g in sodium chloride 0.9 % 100 mL IVPB        1 g 200 mL/hr over 30 Minutes Intravenous  Once 01/15/24 2013 01/16/24 0000   01/15/24 2015  azithromycin (ZITHROMAX) 500 mg in sodium chloride 0.9 % 250 mL IVPB        500 mg 250 mL/hr over 60 Minutes Intravenous  Once 01/15/24 2013 01/16/24 0000       Procedures:   Consultants:     Assessment and Plan: * CAP (community acquired pneumonia) 01-16-2024 CT chest almost that same as it was Jan 2024.  Continue with Rocephin/zithromax. Prn duonebs. 01-17-2024 procal <0.10.  may be viral pneumonia she had on admission. Flu/covid/rsv were negative. Repeat CXR in AM.  01-18-2024 I think pt has viral pneumonia. Will stop ABX. No fevers.  Pleural effusion on right 01-18-2024 pt has pleural effusion on CT chest. Bedside thoracic U/S performed that shows moderate right pleural effusion. Estimated about 500-750 ml. Discussed risks and benefits with pt, dtr and son-in-law(who is an ER physician). They agree to proceed with thoracentesis.  Acute on chronic diastolic CHF (congestive heart failure) (HCC) 01-16-2024 will start IV lasix 40 mg q12h. Update echo. 01-17-2024 LVEF 55%. Still stop IV lasix. Change back to po lasix 40 mg daily.  01-18-2024 continue with lasix 40 mg daily. I think  what drove her acute CHF is probably rapid afib.  Atrial fibrillation with RVR (HCC) 01-16-2024 start IV cardizem gtts. Will update echo. Has moderately elevated pulm pressure in 09-2022. Get K>4 and Mg >2.0 with repletion. 01-17-2024 HR must better controlled today. Stop IV cardizem gtts. Restart po meds. Continue eliquis.  I think her CHF likely due to rapid afib that was driven by her viral pneumonia. Keep K >4.0 and Mg >2.0. continue amiodarone.  01-18-2024 resting HR has improved but still in rapid afib with activity(HR  120-140s).  According to cardiology note from 12-27-2023, pt was suppose to reload with po amiodarone. Communication mix up between office, family and written Rx.  Family only gave her 400 mg amio daily(instead of bid).  Discussed risks/benefits with pt, dtr, son-in-law(who is ER physician). Shared decision making.  Have decided to load her properly with IV amiodarone. Will stop labetalol. Cardizem-CD increased to 300 mg daily today. Continue with eliquis. Keep K>4 and Mg >2.  If pt still with uncontrolled HR with activity by Monday, will consult cardiology for additional assistance.  Chronic diastolic CHF (congestive heart failure) (HCC) 01-16-2024 last echo 09-2022 LVEF 60%. Will update echo. 01-17-2024 echo shows LVEF 55%  01-18-2024 stable. Given pt's COPD, cannnot use large doses of betablockers.  Obesity, Class II, BMI 35-39.9 Estimated body mass index is 35.34 kg/m as calculated from the following:   Height as of this encounter: 5\' 4"  (1.626 m).   Weight as of this encounter: 93.4 kg.   Essential hypertension 01-16-2024 on cardizem gtts. Being used for rapid afib but will also help her BP.  Continue with IV lasix. 01-17-2024 stop IV cardizem gtts. Restart cardizem CD. Continue labetalol.  01-18-2024 stable. Cardizem CD increased to 300 mg qday. Still stop labetalol.  COPD (chronic obstructive pulmonary disease) (HCC) 01-16-2024 not exacerbated at this point I don't think. No indication for IV steroids for now. Continue with prn nebs. I think once she is diuresed her breathing will improve. 01-17-2024 her wheezing has improved. Does not need steroids.  01-18-2024 stable. Chronic.   DVT prophylaxis: SCDs Start: 01/15/24 2210 apixaban (ELIQUIS) tablet 5 mg     Code Status: Full Code Family Communication: discussed with pt,dtr [name] and son-in-law [name](who is a ER physician) Disposition Plan: return home Reason for continuing need for hospitalization: starting IV amiodarone gtts,  getting thoracentesis.  Objective: Vitals:   01/18/24 0353 01/18/24 0721 01/18/24 0851 01/18/24 1209  BP: (!) 136/96 (!) 149/93 (!) 152/99 120/72  Pulse: 99 (!) 110 (!) 140 95  Resp: (!) 24 (!) 21 18 20   Temp: (!) 97.5 F (36.4 C) 97.7 F (36.5 C)  97.6 F (36.4 C)  TempSrc: Oral Oral  Oral  SpO2: 94% 98% 98% 100%  Weight:      Height:        Intake/Output Summary (Last 24 hours) at 01/18/2024 1402 Last data filed at 01/18/2024 1300 Gross per 24 hour  Intake 240 ml  Output 800 ml  Net -560 ml   Filed Weights   01/15/24 1654  Weight: 93.4 kg   Examination:  Physical Exam Vitals and nursing note reviewed.  Constitutional:      General: She is not in acute distress.    Appearance: She is obese. She is not toxic-appearing or diaphoretic.  HENT:     Head: Normocephalic and atraumatic.     Nose: Nose normal.  Eyes:     General: No scleral icterus. Cardiovascular:     Rate and  Rhythm: Normal rate. Rhythm irregular.     Comments: Tachycardic to 110-120s with just moving around in recliner to adjust herself. Pulmonary:     Effort: No respiratory distress.     Breath sounds: No wheezing.     Comments: Bedside thoracic U/S shows moderate right side effusion.  Pleural effusion About 2.5 interspaces tall. Estimated 500-750 ml pleural effusion. Abdominal:     General: Abdomen is protuberant. Bowel sounds are normal. There is no distension.     Palpations: Abdomen is soft.  Musculoskeletal:     Right lower leg: No edema.     Left lower leg: No edema.  Skin:    General: Skin is warm and dry.     Capillary Refill: Capillary refill takes less than 2 seconds.  Neurological:     Mental Status: She is alert and oriented to person, place, and time.    Data Reviewed: I have personally reviewed following labs and imaging studies  CBC: Recent Labs  Lab 01/15/24 1719 01/17/24 0504 01/18/24 0441  WBC 10.5 9.2 9.2  NEUTROABS  --  6.6 6.4  HGB 12.5 11.5* 11.0*  HCT 39.3 35.6*  35.2*  MCV 88.7 88.3 89.6  PLT 278 230 226   Basic Metabolic Panel: Recent Labs  Lab 01/15/24 1719 01/17/24 0504 01/18/24 0441  NA 140 140 140  K 3.8 3.5 4.9  CL 102 100 107  CO2 28 29 28   GLUCOSE 103* 122* 118*  BUN 14 10 13   CREATININE 0.95 0.91 0.77  CALCIUM 10.1 9.2 9.1  MG  --  2.0 2.1   GFR: Estimated Creatinine Clearance: 53.9 mL/min (by C-G formula based on SCr of 0.77 mg/dL). Liver Function Tests: Recent Labs  Lab 01/17/24 0504 01/18/24 0441  AST 16 13*  ALT 20 20  ALKPHOS 60 57  BILITOT 0.5 0.3  PROT 5.8* 5.9*  ALBUMIN 3.4* 3.3*   BNP (last 3 results) Recent Labs    12/27/23 1001 01/15/24 1720  BNP 293.3* 202.5*   Sepsis Labs: Recent Labs  Lab 01/15/24 2110 01/17/24 0504  PROCALCITON  --  <0.10  LATICACIDVEN 0.8  --     Recent Results (from the past 240 hours)  Resp panel by RT-PCR (RSV, Flu A&B, Covid) Anterior Nasal Swab     Status: None   Collection Time: 01/15/24  8:12 PM   Specimen: Anterior Nasal Swab  Result Value Ref Range Status   SARS Coronavirus 2 by RT PCR NEGATIVE NEGATIVE Final   Influenza A by PCR NEGATIVE NEGATIVE Final   Influenza B by PCR NEGATIVE NEGATIVE Final    Comment: (NOTE) The Xpert Xpress SARS-CoV-2/FLU/RSV plus assay is intended as an aid in the diagnosis of influenza from Nasopharyngeal swab specimens and should not be used as a sole basis for treatment. Nasal washings and aspirates are unacceptable for Xpert Xpress SARS-CoV-2/FLU/RSV testing.  Fact Sheet for Patients: BloggerCourse.com  Fact Sheet for Healthcare Providers: SeriousBroker.it  This test is not yet approved or cleared by the [place] FDA and has been authorized for detection and/or diagnosis of SARS-CoV-2 by FDA under an Emergency Use Authorization (EUA). This EUA will remain in effect (meaning this test can be used) for the duration of the COVID-19 declaration under Section 564(b)(1) of  the Act, 21 U.S.C. section 360bbb-3(b)(1), unless the authorization is terminated or revoked.     Resp Syncytial Virus by PCR NEGATIVE NEGATIVE Final    Comment: (NOTE) Fact Sheet for Patients: BloggerCourse.com  Fact Sheet for Healthcare  Providers: SeriousBroker.it  This test is not yet approved or cleared by the [place] and has been authorized for detection and/or diagnosis of SARS-CoV-2 by FDA under an Emergency Use Authorization (EUA). This EUA will remain in effect (meaning this test can be used) for the duration of the COVID-19 declaration under Section 564(b)(1) of the Act, 21 U.S.C. section 360bbb-3(b)(1), unless the authorization is terminated or revoked.  Performed at [facility], [address] [place], [place] [address]   Blood culture (routine x 2)     Status: None (Preliminary result)   Collection Time: 01/15/24  8:40 PM   Specimen: BLOOD  Result Value Ref Range Status   Specimen Description BLOOD LEFT ANTECUBITAL  Final   Special Requests   Final    BOTTLES DRAWN AEROBIC AND ANAEROBIC Blood Culture results may not be optimal due to an inadequate volume of blood received in culture bottles   Culture   Final    NO GROWTH 3 DAYS Performed at [facility], [address] [place], [place] [address]    Report Status PENDING  Incomplete  Blood culture (routine x 2)     Status: None (Preliminary result)   Collection Time: 01/15/24  8:40 PM   Specimen: BLOOD RIGHT HAND  Result Value Ref Range Status   Specimen Description BLOOD RIGHT HAND  Final   Special Requests   Final    BOTTLES DRAWN AEROBIC AND ANAEROBIC Blood Culture results may not be optimal due to an inadequate volume of blood received in culture bottles   Culture   Final    NO GROWTH 3 DAYS Performed at [facility], [address], [place], [place] [address]    Report Status PENDING  Incomplete     Radiology Studies: DG  Chest 2 View Result Date: 01/18/2024 CLINICAL DATA:  Viral pneumonia. EXAM: CHEST - 2 VIEW COMPARISON:  Chest radiograph dated 01/15/2024. FINDINGS: The heart size and mediastinal contours are within normal limits. There is moderate right airspace opacities and atelectasis. A right pleural effusion may contribute. The left lung is clear. No pneumothorax. IMPRESSION: Moderate right airspace opacities and atelectasis consistent with pneumonia. Electronically Signed   By: Romona Curls M.D.   On: 01/18/2024 11:56   ECHOCARDIOGRAM COMPLETE Result Date: 01/17/2024    ECHOCARDIOGRAM REPORT   Patient Name:   ALISEN MARSIGLIA Date of Exam: 01/17/2024 Medical Rec #:  [id number]      Height:       64.0 in Accession #:    [id number]     Weight:       205.9 lb Date of Birth:  Oct 04, 1933      BSA:          1.981 m Patient Age:    89 years       BP:           107/71 mmHg Patient Gender: F              HR:           77 bpm. Exam Location:  Inpatient Procedure: 2D Echo, Cardiac Doppler and Color Doppler (Both Spectral and Color            Flow Doppler were utilized during procedure). Indications:    CHF-acute diastolic  History:        Patient has prior history of Echocardiogram examinations, most  recent 09/27/2022. COPD, Arrythmias:Atrial Fibrillation,                 Signs/Symptoms:Dyspnea; Risk Factors:Hypertension and                 Dyslipidemia.  Sonographer:    Vern Claude Referring Phys: [id number] Macaria Bias IMPRESSIONS  1. Left ventricular ejection fraction, by estimation, is 55%. The left ventricle has normal function. Left ventricular endocardial border not optimally defined to evaluate regional wall motion. Left ventricular diastolic parameters are indeterminate.  2. Right ventricular systolic function is normal. The right ventricular size is not well visualized. There is mildly elevated pulmonary artery systolic pressure. The estimated right ventricular systolic pressure is 37.6 mmHg.  3. Left atrial size was  severely dilated.  4. Right atrial size was mildly dilated.  5. The mitral valve is normal in structure. Trivial mitral valve regurgitation. No evidence of mitral stenosis. Moderate mitral annular calcification.  6. The aortic valve is tricuspid. There is mild calcification of the aortic valve. Aortic valve regurgitation is not visualized. No aortic stenosis is present.  7. Aortic dilatation noted. There is mild dilatation of the aortic root, measuring 39 mm. There is mild dilatation of the ascending aorta, measuring 39 mm.  8. The inferior vena cava is normal in size with greater than 50% respiratory variability, suggesting right atrial pressure of 3 mmHg.  9. The patient was in atrial fibrillation. FINDINGS  Left Ventricle: Left ventricular ejection fraction, by estimation, is 55%. The left ventricle has normal function. Left ventricular endocardial border not optimally defined to evaluate regional wall motion. The left ventricular internal cavity size was normal in size. There is no left ventricular hypertrophy. Left ventricular diastolic parameters are indeterminate. Right Ventricle: The right ventricular size is not well visualized. Right vetricular wall thickness was not well visualized. Right ventricular systolic function is normal. There is mildly elevated pulmonary artery systolic pressure. The tricuspid regurgitant velocity is 2.94 m/s, and with an assumed right atrial pressure of 3 mmHg, the estimated right ventricular systolic pressure is 37.6 mmHg. Left Atrium: Left atrial size was severely dilated. Right Atrium: Right atrial size was mildly dilated. Pericardium: There is no evidence of pericardial effusion. Mitral Valve: The mitral valve is normal in structure. Moderate mitral annular calcification. Trivial mitral valve regurgitation. No evidence of mitral valve stenosis. MV peak gradient, 3.9 mmHg. The mean mitral valve gradient is 2.0 mmHg. Tricuspid Valve: The tricuspid valve is normal in structure.  Tricuspid valve regurgitation is mild. Aortic Valve: The aortic valve is tricuspid. There is mild calcification of the aortic valve. Aortic valve regurgitation is not visualized. No aortic stenosis is present. Aortic valve mean gradient measures 2.7 mmHg. Aortic valve peak gradient measures 4.1 mmHg. Aortic valve area, by VTI measures 2.13 cm. Pulmonic Valve: The pulmonic valve was normal in structure. Pulmonic valve regurgitation is not visualized. Aorta: Aortic dilatation noted. There is mild dilatation of the aortic root, measuring 39 mm. There is mild dilatation of the ascending aorta, measuring 39 mm. Venous: The inferior vena cava is normal in size with greater than 50% respiratory variability, suggesting right atrial pressure of 3 mmHg. IAS/Shunts: No atrial level shunt detected by color flow Doppler.  LEFT VENTRICLE PLAX 2D LVIDd:         5.20 cm LVIDs:         3.60 cm LV PW:         0.80 cm LV IVS:        0.80  cm LVOT diam:     2.00 cm LV SV:         35 LV SV Index:   18 LVOT Area:     3.14 cm  LV Volumes (MOD) LV vol d, MOD A2C: 90.8 ml LV vol d, MOD A4C: 110.0 ml LV vol s, MOD A2C: 39.2 ml LV vol s, MOD A4C: 46.0 ml LV SV MOD A2C:     51.6 ml LV SV MOD A4C:     110.0 ml LV SV MOD BP:      60.8 ml RIGHT VENTRICLE             IVC RV Basal diam:  3.40 cm     IVC diam: 1.60 cm RV Mid diam:    2.00 cm RV S prime:     11.10 cm/s LEFT ATRIUM              Index        RIGHT ATRIUM           Index LA diam:        5.40 cm  2.73 cm/m   RA Area:     17.80 cm LA Vol (A2C):   114.0 ml 57.55 ml/m  RA Volume:   46.50 ml  23.48 ml/m LA Vol (A4C):   97.2 ml  49.07 ml/m LA Biplane Vol: 108.0 ml 54.53 ml/m  AORTIC VALVE                    PULMONIC VALVE AV Area (Vmax):    1.90 cm     PV Vmax:       0.90 m/s AV Area (Vmean):   1.87 cm     PV Peak grad:  3.2 mmHg AV Area (VTI):     2.13 cm AV Vmax:           101.10 cm/s AV Vmean:          72.133 cm/s AV VTI:            0.163 m AV Peak Grad:      4.1 mmHg AV Mean  Grad:      2.7 mmHg LVOT Vmax:         61.25 cm/s LVOT Vmean:        42.850 cm/s LVOT VTI:          0.110 m LVOT/AV VTI ratio: 0.68  AORTA Ao Root diam: 3.90 cm Ao Asc diam:  3.90 cm MITRAL VALVE               TRICUSPID VALVE MV Area (PHT): 4.68 cm    TR Peak grad:   34.6 mmHg MV Area VTI:   3.83 cm    TR Vmax:        294.00 cm/s MV Peak grad:  3.9 mmHg MV Mean grad:  2.0 mmHg    SHUNTS MV Vmax:       0.98 m/s    Systemic VTI:  0.11 m MV Vmean:      57.2 cm/s   Systemic Diam: 2.00 cm MV Decel Time: 162 msec MV E velocity: 90.00 cm/s Dalton McleanMD Electronically signed by Wilfred Lacy Signature Date/Time: 01/17/2024/10:55:46 AM    Final     Scheduled Meds:  amiodarone  150 mg Intravenous Once   apixaban  5 mg Oral BID   diltiazem  300 mg Oral Daily   furosemide  40 mg Oral Daily   gabapentin  300 mg Oral Daily  And   gabapentin  600 mg Oral Daily   methocarbamol  750 mg Oral Daily   And   methocarbamol  1,500 mg Oral Daily   mirtazapine  15 mg Oral QHS   pravastatin  20 mg Oral QHS   Continuous Infusions:  amiodarone     Followed by   amiodarone       LOS: 3 days   Time spent: 45 minutes  Carollee Herter, DO  [facility]  01/18/2024, 2:02 PM

## 2024-01-18 NOTE — Progress Notes (Signed)
 A consult was placed to IV Therapy for new access;  pt has had 2 ivs infiltrate with amiodarone infusing;   area marked with a pen on the pt's right anterior forearm; area extends from wrist to elbow area and is pink and swollen; pt using ice pack to the area; suggest central access if pt will continue to require IV amiodarone.

## 2024-01-19 ENCOUNTER — Inpatient Hospital Stay (HOSPITAL_COMMUNITY)

## 2024-01-19 DIAGNOSIS — J90 Pleural effusion, not elsewhere classified: Secondary | ICD-10-CM | POA: Diagnosis not present

## 2024-01-19 DIAGNOSIS — I5033 Acute on chronic diastolic (congestive) heart failure: Secondary | ICD-10-CM | POA: Diagnosis not present

## 2024-01-19 DIAGNOSIS — J449 Chronic obstructive pulmonary disease, unspecified: Secondary | ICD-10-CM | POA: Diagnosis not present

## 2024-01-19 DIAGNOSIS — I4891 Unspecified atrial fibrillation: Secondary | ICD-10-CM | POA: Diagnosis not present

## 2024-01-19 LAB — BODY FLUID CELL COUNT WITH DIFFERENTIAL
Eos, Fluid: 0 %
Lymphs, Fluid: 90 %
Monocyte-Macrophage-Serous Fluid: 8 % — ABNORMAL LOW (ref 50–90)
Neutrophil Count, Fluid: 2 % (ref 0–25)
Total Nucleated Cell Count, Fluid: 2306 uL — ABNORMAL HIGH (ref 0–1000)

## 2024-01-19 LAB — ALBUMIN, PLEURAL OR PERITONEAL FLUID: Albumin, Fluid: 1.7 g/dL

## 2024-01-19 LAB — GLUCOSE, PLEURAL OR PERITONEAL FLUID: Glucose, Fluid: 120 mg/dL

## 2024-01-19 LAB — PROTEIN, PLEURAL OR PERITONEAL FLUID: Total protein, fluid: <3 g/dL

## 2024-01-19 MED ORDER — DILTIAZEM HCL ER COATED BEADS 240 MG PO CP24
240.0000 mg | ORAL_CAPSULE | Freq: Every day | ORAL | Status: DC
Start: 2024-01-20 — End: 2024-01-22
  Administered 2024-01-20 – 2024-01-22 (×3): 240 mg via ORAL
  Filled 2024-01-19 (×3): qty 1

## 2024-01-19 MED ORDER — LIDOCAINE HCL (PF) 1 % IJ SOLN
10.0000 mL | Freq: Once | INTRAMUSCULAR | Status: AC
  Administered 2024-01-19: 10 mL

## 2024-01-19 MED ORDER — POTASSIUM CHLORIDE CRYS ER 20 MEQ PO TBCR
40.0000 meq | EXTENDED_RELEASE_TABLET | Freq: Every day | ORAL | Status: DC
  Administered 2024-01-20 – 2024-01-21 (×2): 40 meq via ORAL
  Filled 2024-01-19 (×2): qty 2

## 2024-01-19 MED ORDER — METOPROLOL TARTRATE 5 MG/5ML IV SOLN
5.0000 mg | Freq: Once | INTRAVENOUS | Status: AC
  Administered 2024-01-19: 5 mg via INTRAVENOUS
  Filled 2024-01-19: qty 5

## 2024-01-19 MED ORDER — AMIODARONE HCL 200 MG PO TABS
400.0000 mg | ORAL_TABLET | Freq: Two times a day (BID) | ORAL | Status: DC
  Administered 2024-01-19 – 2024-01-20 (×2): 400 mg via ORAL
  Filled 2024-01-19 (×2): qty 2

## 2024-01-19 MED ORDER — FUROSEMIDE 40 MG PO TABS
40.0000 mg | ORAL_TABLET | Freq: Two times a day (BID) | ORAL | Status: DC
  Administered 2024-01-19 – 2024-01-20 (×2): 40 mg via ORAL
  Filled 2024-01-19 (×2): qty 1

## 2024-01-19 MED ORDER — POTASSIUM CHLORIDE CRYS ER 20 MEQ PO TBCR: 40.0000 meq | EXTENDED_RELEASE_TABLET | Freq: Every day | ORAL | Status: DC

## 2024-01-19 MED ORDER — METOPROLOL TARTRATE 5 MG/5ML IV SOLN
5.0000 mg | Freq: Four times a day (QID) | INTRAVENOUS | Status: DC | PRN
  Administered 2024-01-19 – 2024-01-20 (×3): 5 mg via INTRAVENOUS
  Filled 2024-01-19 (×3): qty 5

## 2024-01-19 MED ORDER — DILTIAZEM HCL ER COATED BEADS 120 MG PO CP24
120.0000 mg | ORAL_CAPSULE | Freq: Every day | ORAL | Status: DC
  Administered 2024-01-19: 120 mg via ORAL
  Filled 2024-01-19: qty 1

## 2024-01-19 MED ORDER — ORAL CARE MOUTH RINSE: 15.0000 mL | OROMUCOSAL | Status: DC | PRN

## 2024-01-19 NOTE — Plan of Care (Signed)
  Problem: Education: Goal: Knowledge of General Education information will improve Description: Including pain rating scale, medication(s)/side effects and non-pharmacologic comfort measures Outcome: Progressing   Problem: Health Behavior/Discharge Planning: Goal: Ability to manage health-related needs will improve Outcome: Progressing   Problem: Clinical Measurements: Goal: Cardiovascular complication will be avoided Outcome: Progressing   Problem: Nutrition: Goal: Adequate nutrition will be maintained Outcome: Progressing   Problem: Elimination: Goal: Will not experience complications related to urinary retention Outcome: Progressing   Problem: Safety: Goal: Ability to remain free from injury will improve Outcome: Progressing   Problem: Education: Goal: Knowledge of disease or condition will improve Outcome: Progressing Goal: Understanding of medication regimen will improve Outcome: Progressing   Problem: Activity: Goal: Ability to tolerate increased activity will improve Outcome: Not Progressing   Problem: Cardiac: Goal: Ability to achieve and maintain adequate cardiopulmonary perfusion will improve Outcome: Not Progressing   Problem: Health Behavior/Discharge Planning: Goal: Ability to safely manage health-related needs after discharge will improve Outcome: Progressing

## 2024-01-19 NOTE — Progress Notes (Signed)
 PROGRESS NOTE    [name]  QIH:[id number] DOB: [birth year] DOA: 01/15/2024 PCP: Hadley Pen, MD  Subjective: Pt was not in the room when I saw Dtr Pat at bedside. I spoke with dtr [name] and came back to room after pt came back from thoracentesis.  Pt seen and examined. Pt had 700 ml right side thoracentesis today by IR. Pt breathing much better.   Hospital Course: HPI:  [name] is a 89 y.o. female with medical history hypertension, prior stroke, hyperlipidemia, GERD who presents emerged part with shortness of breath and chest pain.  Patient states that she has had 3 days of progressively worsening shortness of breath and discomfort with breathing.  She presented to the ER where she was found to be afebrile hemodynamically able.  Obtained on presentation which showed BMP unrevealing, WBC 10.5, hemoglobin 12.5, troponin 11, 11.  BNP 202.  Respiratory viral panel was negative for infection.  Patient underwent chest x-ray which demonstrated right upper lobe opacity concerning for infection.  Patient was started on ceftriaxone and azithromycin admitted further workup.   Significant Events: Admitted 01/15/2024 for RUL pneumonia   Significant Labs: WBC 10.5, HgB 12.5, plt 278 Na 140, K 3.8, CO2 of 28, BUN 14, scr 0.95 BNP 202  Significant Imaging Studies: CXR Right upper lobe airspace opacity is again noted concerning for pneumonia. Small right pleural effusion is noted with adjacent subsegmental atelectasis.  Antibiotic Therapy: Anti-infectives (From admission, onward)    Start     Dose/Rate Route Frequency Ordered Stop   01/16/24 1000  cefTRIAXone (ROCEPHIN) 2 g in sodium chloride 0.9 % 100 mL IVPB        2 g 200 mL/hr over 30 Minutes Intravenous Every 24 hours 01/16/24 0250     01/16/24 1000  azithromycin (ZITHROMAX) tablet 500 mg        500 mg Oral Daily 01/16/24 0250     01/15/24 2015  cefTRIAXone (ROCEPHIN) 1 g in sodium chloride 0.9 % 100 mL IVPB        1 g 200  mL/hr over 30 Minutes Intravenous  Once 01/15/24 2013 01/16/24 0000   01/15/24 2015  azithromycin (ZITHROMAX) 500 mg in sodium chloride 0.9 % 250 mL IVPB        500 mg 250 mL/hr over 60 Minutes Intravenous  Once 01/15/24 2013 01/16/24 0000       Procedures:   Consultants:     Assessment and Plan: * CAP (community acquired pneumonia) 01-16-2024 CT chest almost that same as it was Jan 2024.  Continue with Rocephin/zithromax. Prn duonebs. 01-17-2024 procal <0.10.  may be viral pneumonia she had on admission. Flu/covid/rsv were negative. Repeat CXR in AM.  01-18-2024 I think pt has viral pneumonia. Will stop ABX. No fevers.  Pleural effusion on right - s/p 700 ml thoracentesis 01-19-2024 01-18-2024 pt has pleural effusion on CT chest. Bedside thoracic U/S performed that shows moderate right pleural effusion. Estimated about 500-750 ml. Discussed risks and benefits with pt, dtr and son-in-law(who is an ER physician). They agree to proceed with thoracentesis.  01-19-2024 had 700 ml thoracentesis performed today by IR.  Pt breathing much better.  Acute on chronic diastolic CHF (congestive heart failure) (HCC) 01-16-2024 will start IV lasix 40 mg q12h. Update echo. 01-17-2024 LVEF 55%. Still stop IV lasix. Change back to po lasix 40 mg daily. 01-18-2024 continue with lasix 40 mg daily. I think what drove her acute CHF is probably rapid afib.  01-19-2024 will increase her  lasix to 40 mg bid. Add kcl replacement  Atrial fibrillation with RVR (HCC) 01-16-2024 start IV cardizem gtts. Will update echo. Has moderately elevated pulm pressure in 09-2022. Get K>4 and Mg >2.0 with repletion. 01-17-2024 HR must better controlled today. Stop IV cardizem gtts. Restart po meds. Continue eliquis.  I think her CHF likely due to rapid afib that was driven by her viral pneumonia. Keep K >4.0 and Mg >2.0. continue amiodarone.  01-18-2024 resting HR has improved but still in rapid afib with activity(HR  120-140s).  According to cardiology note from 12-27-2023, pt was suppose to reload with po amiodarone. Communication mix up between office, family and written Rx.  Family only gave her 400 mg amio daily(instead of bid).  Discussed risks/benefits with pt, dtr, son-in-law(who is ER physician). Shared decision making.  Have decided to load her properly with IV amiodarone. Will stop labetalol. Cardizem-CD increased to 300 mg daily today. Continue with eliquis. Keep K>4 and Mg >2.  If pt still with uncontrolled HR with activity by Monday, will consult cardiology for additional assistance.  01-19-2024 will complete 24 hour of IV loading of amiodarone. Had IV infiltrate last night. Don't want to place picc line. Will change to po amio 400 mg bid.  Give 5 mg IV lopressor.  Any sort of physical activity such as standing up to use BSC send her back into rapid afib.  Pt may need cardioversion. May need cards to see her tomorrow. Continue with Eliquis Bid.  Chronic diastolic CHF (congestive heart failure) (HCC) 01-16-2024 last echo 09-2022 LVEF 60%. Will update echo. 01-17-2024 echo shows LVEF 55%  01-18-2024 stable. Given pt's COPD, cannnot use large doses of betablockers.  Obesity, Class II, BMI 35-39.9 Estimated body mass index is 35.34 kg/m as calculated from the following:   Height as of this encounter: 5\' 4"  (1.626 m).   Weight as of this encounter: 93.4 kg.   Essential hypertension 01-16-2024 on cardizem gtts. Being used for rapid afib but will also help her BP.  Continue with IV lasix. 01-17-2024 stop IV cardizem gtts. Restart cardizem CD. Continue labetalol. 01-18-2024 stable. Cardizem CD increased to 300 mg qday. Still stop labetalol.  01-19-2024. Keep off labetalol. May need to add scheduled lopressor. Pt was a little low this AM.  Cardizem CD 240 mg daily.  COPD (chronic obstructive pulmonary disease) (HCC) 01-16-2024 not exacerbated at this point I don't think. No indication for IV steroids  for now. Continue with prn nebs. I think once she is diuresed her breathing will improve. 01-17-2024 her wheezing has improved. Does not need steroids. 01-18-2024 stable. Chronic.  01-19-2024 stable. Chronic.   DVT prophylaxis: SCDs Start: 01/15/24 2210 apixaban (ELIQUIS) tablet 5 mg     Code Status: Full Code Family Communication: discussed with pt's dtr Pat at bedside Disposition Plan: return home Reason for continuing need for hospitalization: remains on IV amio  Objective: Vitals:   01/19/24 0346 01/19/24 1050 01/19/24 1100 01/19/24 1115  BP: (!) 121/92 (!) 149/103 (!) 172/135 (!) 147/85  Pulse: 80     Resp: 18     Temp: 97.6 F (36.4 C)     TempSrc: Oral     SpO2: 99%     Weight:      Height:        Intake/Output Summary (Last 24 hours) at 01/19/2024 1331 Last data filed at 01/19/2024 0430 Gross per 24 hour  Intake 651.54 ml  Output 450 ml  Net 201.54 ml   [employer]  01/15/24 1654  Weight: 93.4 kg    Examination:  Physical Exam Vitals and nursing note reviewed.  Constitutional:      Appearance: She is obese.  HENT:     Head: Normocephalic and atraumatic.     Nose: Nose normal.  Eyes:     General: No scleral icterus. Cardiovascular:     Rate and Rhythm: Tachycardia present. Rhythm irregular.     Pulses: Normal pulses.  Pulmonary:     Effort: Pulmonary effort is normal. No respiratory distress.     Breath sounds: Normal breath sounds. No wheezing.  Abdominal:     General: Bowel sounds are normal.     Palpations: Abdomen is soft.  Musculoskeletal:     Right lower leg: No edema.     Left lower leg: No edema.  Skin:    General: Skin is warm and dry.     Capillary Refill: Capillary refill takes less than 2 seconds.  Neurological:     General: No focal deficit present.     Mental Status: She is alert and oriented to person, place, and time.    Data Reviewed: I have personally reviewed following labs and imaging studies  CBC: Recent Labs  Lab  01/15/24 1719 01/17/24 0504 01/18/24 0441  WBC 10.5 9.2 9.2  NEUTROABS  --  6.6 6.4  HGB 12.5 11.5* 11.0*  HCT 39.3 35.6* 35.2*  MCV 88.7 88.3 89.6  PLT 278 230 226   Basic Metabolic Panel: Recent Labs  Lab 01/15/24 1719 01/17/24 0504 01/18/24 0441  NA 140 140 140  K 3.8 3.5 4.9  CL 102 100 107  CO2 28 29 28   GLUCOSE 103* 122* 118*  BUN 14 10 13   CREATININE 0.95 0.91 0.77  CALCIUM 10.1 9.2 9.1  MG  --  2.0 2.1   GFR: Estimated Creatinine Clearance: 53.9 mL/min (by C-G formula based on SCr of 0.77 mg/dL). Liver Function Tests: Recent Labs  Lab 01/17/24 0504 01/18/24 0441  AST 16 13*  ALT 20 20  ALKPHOS 60 57  BILITOT 0.5 0.3  PROT 5.8* 5.9*  ALBUMIN 3.4* 3.3*   BNP (last 3 results) Recent Labs    12/27/23 1001 01/15/24 1720  BNP 293.3* 202.5*   Sepsis Labs: Recent Labs  Lab 01/15/24 2110 01/17/24 0504  PROCALCITON  --  <0.10  LATICACIDVEN 0.8  --     Recent Results (from the past 240 hours)  Resp panel by RT-PCR (RSV, Flu A&B, Covid) Anterior Nasal Swab     Status: None   Collection Time: 01/15/24  8:12 PM   Specimen: Anterior Nasal Swab  Result Value Ref Range Status   SARS Coronavirus 2 by RT PCR NEGATIVE NEGATIVE Final   Influenza A by PCR NEGATIVE NEGATIVE Final   Influenza B by PCR NEGATIVE NEGATIVE Final    Comment: (NOTE) The Xpert Xpress SARS-CoV-2/FLU/RSV plus assay is intended as an aid in the diagnosis of influenza from Nasopharyngeal swab specimens and should not be used as a sole basis for treatment. Nasal washings and aspirates are unacceptable for Xpert Xpress SARS-CoV-2/FLU/RSV testing.  Fact Sheet for Patients: BloggerCourse.com  Fact Sheet for Healthcare Providers: SeriousBroker.it  This test is not yet approved or cleared by the [place] FDA and has been authorized for detection and/or diagnosis of SARS-CoV-2 by FDA under an Emergency Use Authorization (EUA). This  EUA will remain in effect (meaning this test can be used) for the duration of the COVID-19 declaration under Section 564(b)(1) of the  Act, 21 U.S.C. section 360bbb-3(b)(1), unless the authorization is terminated or revoked.     Resp Syncytial Virus by PCR NEGATIVE NEGATIVE Final    Comment: (NOTE) Fact Sheet for Patients: BloggerCourse.com  Fact Sheet for Healthcare Providers: SeriousBroker.it  This test is not yet approved or cleared by the [place] FDA and has been authorized for detection and/or diagnosis of SARS-CoV-2 by FDA under an Emergency Use Authorization (EUA). This EUA will remain in effect (meaning this test can be used) for the duration of the COVID-19 declaration under Section 564(b)(1) of the Act, 21 U.S.C. section 360bbb-3(b)(1), unless the authorization is terminated or revoked.  Performed at [facility], [address], [place], [place] [address]   Blood culture (routine x 2)     Status: None (Preliminary result)   Collection Time: 01/15/24  8:40 PM   Specimen: BLOOD  Result Value Ref Range Status   Specimen Description BLOOD LEFT ANTECUBITAL  Final   Special Requests   Final    BOTTLES DRAWN AEROBIC AND ANAEROBIC Blood Culture results may not be optimal due to an inadequate volume of blood received in culture bottles   Culture   Final    NO GROWTH 4 DAYS Performed at [facility], [address], [place], [place] [address]    Report Status PENDING  Incomplete  Blood culture (routine x 2)     Status: None (Preliminary result)   Collection Time: 01/15/24  8:40 PM   Specimen: BLOOD RIGHT HAND  Result Value Ref Range Status   Specimen Description BLOOD RIGHT HAND  Final   Special Requests   Final    BOTTLES DRAWN AEROBIC AND ANAEROBIC Blood Culture results may not be optimal due to an inadequate volume of blood received in culture bottles   Culture   Final    NO GROWTH 4 DAYS Performed at  [facility], [address] [place], [place] [address]    Report Status PENDING  Incomplete    Radiology Studies: US THORACENTESIS ASP PLEURAL SPACE W/IMG GUIDE Result Date: 01/19/2024 INDICATION: 89 year old female. History of atrial fibrillation. Presented to ED with chest pain. Found to have a right-sided pleural effusion. Request is for therapeutic and diagnostic thoracentesis EXAM: ULTRASOUND GUIDED THERAPEUTIC AND DIAGNOSTIC RIGHT-SIDED THORACENTESIS MEDICATIONS: Lidocaine 1% 10 mL COMPLICATIONS: None immediate. PROCEDURE: An ultrasound guided thoracentesis was thoroughly discussed with the patient and questions answered. The benefits, risks, alternatives and complications were also discussed. The patient understands and wishes to proceed with the procedure. Written consent was obtained. Ultrasound was performed to localize and mark an adequate pocket of fluid in the right chest. The area was then prepped and draped in the normal sterile fashion. 1% Lidocaine was used for local anesthesia. Under ultrasound guidance a 6 Fr Safe-T-Centesis catheter was introduced. Thoracentesis was performed. The catheter was removed and a dressing applied. FINDINGS: A total of approximately 700 mL of straw-colored fluid was removed. Samples were sent to the laboratory as requested by the clinical team. IMPRESSION: Successful ultrasound guided therapeutic and diagnostic right-sided thoracentesis yielding 700 mL of pleural fluid. Performed by Anders Grant NP Electronically Signed   By: Marliss Coots M.D.   On: 01/19/2024 13:13   DG Chest 1 View Result Date: 01/19/2024 CLINICAL DATA:  Status post right thoracentesis, pleural effusion EXAM: CHEST  1 VIEW COMPARISON:  01/18/2024 FINDINGS: Single frontal view of the chest demonstrates stable enlargement the cardiac silhouette. Near complete resolution of the right pleural effusion seen  previously. Patchy airspace disease within the right mid and upper lung zones  unchanged. No pneumothorax. No acute bony abnormalities. IMPRESSION: 1. Near complete resolution of right pleural effusion after thoracentesis. No evidence of pneumothorax. 2. Persistent right mid and upper lung zone airspace disease. Electronically Signed   By: Sharlet Salina M.D.   On: 01/19/2024 12:00   DG Chest 2 View Result Date: 01/18/2024 CLINICAL DATA:  Viral pneumonia. EXAM: CHEST - 2 VIEW COMPARISON:  Chest radiograph dated 01/15/2024. FINDINGS: The heart size and mediastinal contours are within normal limits. There is moderate right airspace opacities and atelectasis. A right pleural effusion may contribute. The left lung is clear. No pneumothorax. IMPRESSION: Moderate right airspace opacities and atelectasis consistent with pneumonia. Electronically Signed   By: Romona Curls M.D.   On: 01/18/2024 11:56    Scheduled Meds:  [START ON 01/20/2024] amiodarone  400 mg Oral BID   apixaban  5 mg Oral BID   [START ON 01/20/2024] diltiazem  240 mg Oral Daily   furosemide  40 mg Oral BID   gabapentin  300 mg Oral Daily   And   gabapentin  600 mg Oral Daily   methocarbamol  750 mg Oral Daily   And   methocarbamol  1,500 mg Oral Daily   metoprolol tartrate  5 mg Intravenous Once   mirtazapine  15 mg Oral QHS   potassium chloride  40 mEq Oral Daily   pravastatin  20 mg Oral QHS   Continuous Infusions:  amiodarone 30 mg/hr (01/19/24 0859)     LOS: 4 days   Time spent: 50 minutes  Carollee Herter, DO  [facility]  01/19/2024, 1:31 PM

## 2024-01-19 NOTE — Procedures (Signed)
Ultrasound-guided diagnostic and therapeutic right sided thoracentesis performed yielding 700 milliliters of straw colored fluid. No immediate complications.   Diagnostic fluid was sent to the lab for further analysis. Follow-up chest x-ray pending. EBL is < 2 ml.

## 2024-01-19 NOTE — Progress Notes (Signed)
 Mobility Specialist Progress Note;   01/19/24 0855  Mobility  Activity Transferred from bed to chair  Level of Assistance Contact guard assist, steadying assist  Assistive Device Front wheel walker  Distance Ambulated (ft) 5 ft  Activity Response Tolerated well  Mobility Referral Yes  Mobility visit 1 Mobility  Mobility Specialist Start Time (ACUTE ONLY) 0855  Mobility Specialist Stop Time (ACUTE ONLY) 0905  Mobility Specialist Time Calculation (min) (ACUTE ONLY) 10 min   Pt agreeable to mobility. Required MinG assistance to safely transfer pt from bed to chair. VSS throughout and no c/o when asked. Pt left comfortably in chair with all needs met. Daughter and RN in room.   Caesar Bookman Mobility Specialist Please contact via SecureChat or [employer] [phone]

## 2024-01-20 DIAGNOSIS — I5033 Acute on chronic diastolic (congestive) heart failure: Secondary | ICD-10-CM | POA: Diagnosis not present

## 2024-01-20 DIAGNOSIS — I4891 Unspecified atrial fibrillation: Secondary | ICD-10-CM | POA: Diagnosis not present

## 2024-01-20 DIAGNOSIS — J189 Pneumonia, unspecified organism: Secondary | ICD-10-CM | POA: Diagnosis not present

## 2024-01-20 LAB — COMPREHENSIVE METABOLIC PANEL
ALT: 20 U/L (ref 0–44)
AST: 18 U/L (ref 15–41)
Albumin: 3.1 g/dL — ABNORMAL LOW (ref 3.5–5.0)
Alkaline Phosphatase: 60 U/L (ref 38–126)
Anion gap: 12 (ref 5–15)
BUN: 11 mg/dL (ref 8–23)
CO2: 31 mmol/L (ref 22–32)
Calcium: 9 mg/dL (ref 8.9–10.3)
Chloride: 97 mmol/L — ABNORMAL LOW (ref 98–111)
Creatinine, Ser: 0.91 mg/dL (ref 0.44–1.00)
GFR, Estimated: >60 mL/min (ref >60)
Glucose, Bld: 109 mg/dL — ABNORMAL HIGH (ref 70–99)
Potassium: 3.9 mmol/L (ref 3.5–5.1)
Sodium: 140 mmol/L (ref 135–145)
Total Bilirubin: 0.6 mg/dL (ref 0.0–1.2)
Total Protein: 5.5 g/dL — ABNORMAL LOW (ref 6.5–8.1)

## 2024-01-20 LAB — CBC WITH DIFFERENTIAL/PLATELET
Abs Immature Granulocytes: 0.03 10*3/uL (ref 0.00–0.07)
Basophils Absolute: 0.1 10*3/uL (ref 0.0–0.1)
Basophils Relative: 1 %
Eosinophils Absolute: 0.4 10*3/uL (ref 0.0–0.5)
Eosinophils Relative: 5 %
HCT: 35.4 % — ABNORMAL LOW (ref 36.0–46.0)
Hemoglobin: 11.2 g/dL — ABNORMAL LOW (ref 12.0–15.0)
Immature Granulocytes: 0 %
Lymphocytes Relative: 21 %
Lymphs Abs: 1.8 10*3/uL (ref 0.7–4.0)
MCH: 28.1 pg (ref 26.0–34.0)
MCHC: 31.6 g/dL (ref 30.0–36.0)
MCV: 88.7 fL (ref 80.0–100.0)
Monocytes Absolute: 0.8 10*3/uL (ref 0.1–1.0)
Monocytes Relative: 9 %
Neutro Abs: 5.4 10*3/uL (ref 1.7–7.7)
Neutrophils Relative %: 64 %
Platelets: 224 10*3/uL (ref 150–400)
RBC: 3.99 MIL/uL (ref 3.87–5.11)
RDW: 14.1 % (ref 11.5–15.5)
WBC: 8.4 10*3/uL (ref 4.0–10.5)
nRBC: 0 % (ref 0.0–0.2)

## 2024-01-20 LAB — CULTURE, BLOOD (ROUTINE X 2)
Culture: NO GROWTH
Culture: NO GROWTH

## 2024-01-20 LAB — MAGNESIUM: Magnesium: 2 mg/dL (ref 1.7–2.4)

## 2024-01-20 MED ORDER — AMIODARONE HCL 200 MG PO TABS
200.0000 mg | ORAL_TABLET | Freq: Two times a day (BID) | ORAL | Status: DC
  Administered 2024-01-20 – 2024-01-22 (×4): 200 mg via ORAL
  Filled 2024-01-20 (×4): qty 1

## 2024-01-20 MED ORDER — SODIUM CHLORIDE 0.9% FLUSH
3.0000 mL | Freq: Two times a day (BID) | INTRAVENOUS | Status: DC
  Administered 2024-01-20 – 2024-01-21 (×2): 3 mL via INTRAVENOUS

## 2024-01-20 MED ORDER — BISOPROLOL FUMARATE 5 MG PO TABS
5.0000 mg | ORAL_TABLET | Freq: Every day | ORAL | Status: DC
Start: 2024-01-20 — End: 2024-01-20

## 2024-01-20 MED ORDER — FUROSEMIDE 40 MG PO TABS: 40.0000 mg | ORAL_TABLET | Freq: Two times a day (BID) | ORAL | Status: DC

## 2024-01-20 MED ORDER — SODIUM CHLORIDE 0.9% FLUSH: 3.0000 mL | INTRAVENOUS | Status: DC | PRN

## 2024-01-20 MED ORDER — FUROSEMIDE 10 MG/ML IJ SOLN
40.0000 mg | Freq: Once | INTRAMUSCULAR | Status: AC
  Administered 2024-01-20: 40 mg via INTRAVENOUS
  Filled 2024-01-20: qty 4

## 2024-01-20 NOTE — Progress Notes (Signed)
 PROGRESS NOTE  [name] ZOX:[id number] DOB: [birth year] DOA: 01/15/2024 PCP: Hadley Pen, MD   LOS: 5 days   Brief narrative:   [name] is a 89 y.o. female with past medical history of hypertension, prior stroke, hyperlipidemia, GERD presented to hospital with shortness of breath and chest pain for 3 days.  Patient was noted to be febrile.  Initial labs showed WBC 18.5.  Respiratory viral panel was negative for infection.  Chest x-ray showed right upper lobe opacity concerning for infection.  Patient was started on ceftriaxone and azithromycin and was admitted further workup.    Assessment/Plan: Principal Problem:   CAP (community acquired pneumonia) Active Problems:   Acute on chronic diastolic CHF (congestive heart failure) (HCC)   Pleural effusion on right - s/p 700 ml thoracentesis 01-19-2024   Atrial fibrillation with RVR (HCC)   COPD (chronic obstructive pulmonary disease) (HCC)   Essential hypertension   Obesity, Class II, BMI 35-39.9   Chronic diastolic CHF (congestive heart failure) (HCC)   * CAP (community acquired pneumonia) CT scan of the chest was also performed that showed small to moderate right pleural effusion..  Patient initially received Rocephin Zithromax DuoNebs.  Pro-Cal was less than 0.10.  Flu COVID RSV was negative.  Antibiotics have been stopped at this time.  Possible viral pneumonia.  No fever.  Continue DuoNebs.  Currently on supplemental oxygen.  Will continue to wean as able.  Pleural effusion on right - s/p 700 ml thoracentesis 01-19-2024 Improved breathing on nasal cannula 2 L/min. . Acute on chronic diastolic CHF (congestive heart failure) likely from A-fib with RVR. Easily received IV Lasix currently on oral Lasix twice daily.  Continue intake and output charting, daily weights.   Atrial fibrillation with RVR  Initially received Cardizem drip.  Patient received amiodarone load in the hospital and Cardizem CD has been increased to 300  mg daily.  Currently on amiodarone 400 milligram twice daily.  Continue Eliquis.  Minimal activity causes A-fib with RVR.  Will get cardiology consultation.  Chronic diastolic CHF (congestive heart failure) (HCC) Recent 2D echocardiogram from 09-2022 with LVEF 60%.  2D echocardiogram on 01-17-2024 shows LVEF 55% given COPD unable to use large doses of beta-blocker.  Might need to scheduled dose of metoprolol.  Will reach out to Cardiology.   Obesity, Class II, BMI 35-39.9 Body mass index is 35.34 kg/m.  Would benefit from weight loss as outpatient.   Essential hypertension Off labetalol, on increased dose of Cardizem CD and Lasix.  Might need scheduled.  Beta-blockers.  COPD (chronic obstructive pulmonary disease) (HCC) Continue nebulizers.  No need for steroids.  Debility weakness.  Patient does have a walker for ambulation at baseline.  Will get PT evaluation.  DVT prophylaxis: SCDs Start: 01/15/24 2210 apixaban (ELIQUIS) tablet 5 mg   Disposition: Home  Status is: Inpatient Remains inpatient appropriate because: Pending clinical improvement    Code Status:     Code Status: Full Code  Family Communication: Spoke with the patient's daughter at bedside.  Consultants: Will consult cardiology  Procedures: None  Anti-infectives:  None  Anti-infectives (From admission, onward)    Start     Dose/Rate Route Frequency Ordered Stop   01/16/24 1000  cefTRIAXone (ROCEPHIN) 2 g in sodium chloride 0.9 % 100 mL IVPB  Status:  Discontinued        2 g 200 mL/hr over 30 Minutes Intravenous Every 24 hours 01/16/24 0250 01/18/24 1352   01/16/24 1000  azithromycin (ZITHROMAX) tablet 500  mg  Status:  Discontinued        500 mg Oral Daily 01/16/24 0250 01/18/24 1352   01/15/24 2015  cefTRIAXone (ROCEPHIN) 1 g in sodium chloride 0.9 % 100 mL IVPB        1 g 200 mL/hr over 30 Minutes Intravenous  Once 01/15/24 2013 01/16/24 0000   01/15/24 2015  azithromycin (ZITHROMAX) 500 mg in sodium  chloride 0.9 % 250 mL IVPB        500 mg 250 mL/hr over 60 Minutes Intravenous  Once 01/15/24 2013 01/16/24 0000        Subjective: Today, patient was seen and examined at bedside.  Patient complains of elevated blood pressure cough and palpitations.  Complains of mild shortness of breath and some temporary stomach discomfort.  Family at bedside.  Has had bowel movement.  Objective: Vitals:   01/19/24 2340 01/20/24 0321  BP: (!) 130/97 (!) 131/92  Pulse: (!) 105 98  Resp: (!) 24 18  Temp: 97.9 F (36.6 C) 98 F (36.7 C)  SpO2: 93% 93%    Intake/Output Summary (Last 24 hours) at 01/20/2024 1352 Last data filed at 01/20/2024 0325 Gross per 24 hour  Intake --  Output 1345 ml  Net -1345 ml   Filed Weights   01/15/24 1654  Weight: 93.4 kg   Body mass index is 35.34 kg/m.   Physical Exam: GENERAL: Patient is alert awake and Communicative. Not in obvious distress.  Elderly female, appears chronically ill, on nasal cannula oxygen, obese built. HENT: No scleral pallor or icterus. Pupils equally reactive to light. Oral mucosa is moist NECK: is supple, no gross swelling noted. CHEST: Clear to auscultation. No crackles or wheezes.  CVS: S1 and S2 heard, irregularly irregular rhythm with tachycardia. ABDOMEN: Soft, non-tender, bowel sounds are present. EXTREMITIES: Bilateral lower extremity trace edema. CNS: Cranial nerves are intact. No focal motor deficits. SKIN: warm and dry without rashes.  Data Review: I have personally reviewed the following laboratory data and studies,  CBC: Recent Labs  Lab 01/15/24 1719 01/17/24 0504 01/18/24 0441 01/20/24 0517  WBC 10.5 9.2 9.2 8.4  NEUTROABS  --  6.6 6.4 5.4  HGB 12.5 11.5* 11.0* 11.2*  HCT 39.3 35.6* 35.2* 35.4*  MCV 88.7 88.3 89.6 88.7  PLT 278 230 226 224   Basic Metabolic Panel: Recent Labs  Lab 01/15/24 1719 01/17/24 0504 01/18/24 0441 01/20/24 0517  NA 140 140 140 140  K 3.8 3.5 4.9 3.9  CL 102 100 107 97*   CO2 28 29 28 31   GLUCOSE 103* 122* 118* 109*  BUN 14 10 13 11   CREATININE 0.95 0.91 0.77 0.91  CALCIUM 10.1 9.2 9.1 9.0  MG  --  2.0 2.1 2.0   Liver Function Tests: Recent Labs  Lab 01/17/24 0504 01/18/24 0441 01/20/24 0517  AST 16 13* 18  ALT 20 20 20   ALKPHOS 60 57 60  BILITOT 0.5 0.3 0.6  PROT 5.8* 5.9* 5.5*  ALBUMIN 3.4* 3.3* 3.1*   No results for input(s): "LIPASE", "AMYLASE" in the last 168 hours. No results for input(s): "AMMONIA" in the last 168 hours. Cardiac Enzymes: No results for input(s): "CKTOTAL", "CKMB", "CKMBINDEX", "TROPONINI" in the last 168 hours. BNP (last 3 results) Recent Labs    12/27/23 1001 01/15/24 1720  BNP 293.3* 202.5*    ProBNP (last 3 results) No results for input(s): "PROBNP" in the last 8760 hours.  CBG: No results for input(s): "GLUCAP" in the last 168 hours. Recent  Results (from the past 240 hours)  Resp panel by RT-PCR (RSV, Flu A&B, Covid) Anterior Nasal Swab     Status: None   Collection Time: 01/15/24  8:12 PM   Specimen: Anterior Nasal Swab  Result Value Ref Range Status   SARS Coronavirus 2 by RT PCR NEGATIVE NEGATIVE Final   Influenza A by PCR NEGATIVE NEGATIVE Final   Influenza B by PCR NEGATIVE NEGATIVE Final    Comment: (NOTE) The Xpert Xpress SARS-CoV-2/FLU/RSV plus assay is intended as an aid in the diagnosis of influenza from Nasopharyngeal swab specimens and should not be used as a sole basis for treatment. Nasal washings and aspirates are unacceptable for Xpert Xpress SARS-CoV-2/FLU/RSV testing.  Fact Sheet for Patients: BloggerCourse.com  Fact Sheet for Healthcare Providers: SeriousBroker.it  This test is not yet approved or cleared by the [place] FDA and has been authorized for detection and/or diagnosis of SARS-CoV-2 by FDA under an Emergency Use Authorization (EUA). This EUA will remain in effect (meaning this test can be used) for the duration  of the COVID-19 declaration under Section 564(b)(1) of the Act, 21 U.S.C. section 360bbb-3(b)(1), unless the authorization is terminated or revoked.     Resp Syncytial Virus by PCR NEGATIVE NEGATIVE Final    Comment: (NOTE) Fact Sheet for Patients: BloggerCourse.com  Fact Sheet for Healthcare Providers: SeriousBroker.it  This test is not yet approved or cleared by the [place] FDA and has been authorized for detection and/or diagnosis of SARS-CoV-2 by FDA under an Emergency Use Authorization (EUA). This EUA will remain in effect (meaning this test can be used) for the duration of the COVID-19 declaration under Section 564(b)(1) of the Act, 21 U.S.C. section 360bbb-3(b)(1), unless the authorization is terminated or revoked.  Performed at [facility], [address], [place], [place] [address]   Blood culture (routine x 2)     Status: None   Collection Time: 01/15/24  8:40 PM   Specimen: BLOOD  Result Value Ref Range Status   Specimen Description BLOOD LEFT ANTECUBITAL  Final   Special Requests   Final    BOTTLES DRAWN AEROBIC AND ANAEROBIC Blood Culture results may not be optimal due to an inadequate volume of blood received in culture bottles   Culture   Final    NO GROWTH 5 DAYS Performed at [facility], [address], [place], [place] [address]    Report Status 01/20/2024 FINAL  Final  Blood culture (routine x 2)     Status: None   Collection Time: 01/15/24  8:40 PM   Specimen: BLOOD RIGHT HAND  Result Value Ref Range Status   Specimen Description BLOOD RIGHT HAND  Final   Special Requests   Final    BOTTLES DRAWN AEROBIC AND ANAEROBIC Blood Culture results may not be optimal due to an inadequate volume of blood received in culture bottles   Culture   Final    NO GROWTH 5 DAYS Performed at [facility], [address], [place], [place] [address]    Report Status 01/20/2024 FINAL  Final      Studies: US THORACENTESIS ASP PLEURAL SPACE W/IMG GUIDE Result Date: 01/19/2024 INDICATION: 89 year old female. History of atrial fibrillation. Presented to ED with chest pain. Found to have a right-sided pleural effusion. Request is for therapeutic and diagnostic thoracentesis EXAM: ULTRASOUND GUIDED THERAPEUTIC AND DIAGNOSTIC RIGHT-SIDED THORACENTESIS MEDICATIONS: Lidocaine 1% 10 mL COMPLICATIONS: None immediate. PROCEDURE: An ultrasound guided thoracentesis was thoroughly discussed with the patient  and questions answered. The benefits, risks, alternatives and complications were also discussed. The patient understands and wishes to proceed with the procedure. Written consent was obtained. Ultrasound was performed to localize and mark an adequate pocket of fluid in the right chest. The area was then prepped and draped in the normal sterile fashion. 1% Lidocaine was used for local anesthesia. Under ultrasound guidance a 6 Fr Safe-T-Centesis catheter was introduced. Thoracentesis was performed. The catheter was removed and a dressing applied. FINDINGS: A total of approximately 700 mL of straw-colored fluid was removed. Samples were sent to the laboratory as requested by the clinical team. IMPRESSION: Successful ultrasound guided therapeutic and diagnostic right-sided thoracentesis yielding 700 mL of pleural fluid. Performed by Anders Grant NP Electronically Signed   By: Marliss Coots M.D.   On: 01/19/2024 13:13   DG Chest 1 View Result Date: 01/19/2024 CLINICAL DATA:  Status post right thoracentesis, pleural effusion EXAM: CHEST  1 VIEW COMPARISON:  01/18/2024 FINDINGS: Single frontal view of the chest demonstrates stable enlargement the cardiac silhouette. Near complete resolution of the right pleural effusion seen previously. Patchy airspace disease within the right mid and upper lung zones unchanged. No pneumothorax. No acute bony abnormalities. IMPRESSION: 1. Near complete resolution of right pleural  effusion after thoracentesis. No evidence of pneumothorax. 2. Persistent right mid and upper lung zone airspace disease. Electronically Signed   By: Sharlet Salina M.D.   On: 01/19/2024 12:00      Joycelyn Das, MD  [facility] 01/20/2024  If 7PM-7AM, please contact night-coverage

## 2024-01-20 NOTE — H&P (View-Only) (Signed)
 Cardiology Consultation   Patient ID: [name] MRN: [id number]; DOB: 1933/10/02  Admit date: 01/15/2024 Date of Consult: 01/20/2024  PCP:  Hadley Pen, MD   [facility] Providers Cardiologist:  Maisie Fus, MD   {   Patient Profile:   [name] is a 89 y.o. female with a hx of hypertension, prior TIA/stroke, hyperlipidemia, GERD, paroxysmal afib, diastolic heart failure, COPD, obesity,  who is being seen 01/20/2024 for the evaluation of A fib RVR at the request of Dr Tyson Babinski.  History of Present Illness:   [name] with above PMH presented to ER on 01/15/24 for 3 days of SOB and chest discomfort. Labs from 01/15/24 showed BNP 202. Hs trop 11 >11. CBC and BMP unremarkable. Lactic acid 0.8. CXR showed right upper lobe airspace opacity concerning for pneumonia. CT chest 01/16/24 showed Small-moderate sized right-sided pleural effusion, dependent right basilar atelectasis and patchy airspace opacity within the more superior aspect of the right lower lobe. Additionally, there is interlobular septal thickening and patchy ground-glass attenuation throughout the right upper lobe. Findings may represent a combination of asymmetric edema with atelectasis and pneumonia. Procal subsequently was negative. She was admitted to hospitlaist for CAP, felt due to viral cause, antibiotic was stopped. She underwent right thoracentesis on 01/19/24 with fluid removed. Labs not done on 01/19/24 to determine light's criteria. She was felt volume overloaded and given IV Lasix for CHF. She was noted in A fib RVR at admission, was given cardizem gtt and repeated amiodarone load, currently on amiodarone 400mg  BID, and continued on PTA Cardizem 240mg . His heart rate is elevated with minimal activity. Cardiology is consulted today for further evaluation.   Upon encounter, patient is sitting in chair. She states she normally has some SOB, heart palpitation, fatigue, and feeling weak with her A  fib. She recalls having increased SOB with exertion and leg/feet swelling before she came in to the ER on 01/15/24. She was taking her lasix 40mg  daily but still had trouble urinating. She is not normally on oxygen and is currently on [place] oxygen. She felt overall improves, has ambulated in the room without issue today. She states she is not feeling much of her A fib today, denied any chest pain, worsening of SOB, palpitation, dizziness, syncope. She wants to go home.    Per chart review, she follows Dr Wyline Mood, has a history of paroxysmal atrial fibrillation and has been maintained on Labetalol, Diltiazem, and Eliquis.   She had hospitalization 11/2022 for respiratory failure due to flu and pneumonia, course complicated by A fib RVR. She was started on amiodarone and subsequent conversion, continued on diltiazem 240 and labetalol 200mg  BID and Eliquis at time of discharge.   She followed up at A fib clinic 12/18/22, back in Afib, advised to continue amiodarone and labetalol and diltiazem 240mg . She was offered DCCV by Dr Wyline Mood that time but her son preferred medical therapy.  She was seen by cardiology 01/14/23 for pre-op for right tib/fib fracture. She was in sinus rhythm on above regimen.      Amiodarone was stopped 08/21/23 office visit as she remained in sinus.   She had ongoing SOB and heart palpitation since 10/2023 with on and off A fib. She saw Dr Wyline Mood 11/25/23, was arranged for DCCV, this was cancelled on 11/28/23 as she was back in sinus rhythm.   She last followed up on 12/27/23 in the office, back in A fib and was symptomatic, also had some PND  and orthopnea. She was re-loaded with amiodarone, also started on lasix 40mg  for CHF.   She has chronic diastolic heart failure, did not tolerate spironolactone due to hyperkalemia in the past. Echo from 01/17/24 showed LVEF 55%, not optimal for WM assessment, indeterminate diastolic, normal RV, PASP 37.6 mmHg, severe LAE, mild RAE, trivial MR, mod MAC, mild  dilatation of the aortic root 39 mm. There is mild dilatation of the ascending aorta 39 mm. IVC normal.     Past Medical History:  Diagnosis Date   Arthritis    Atrial fibrillation, transient (HCC)    post op hip surgery; May 2021   Closed fracture of right fibula and tibia 01/13/2023   Diverticulitis    Dyspnea    with exertion    GERD (gastroesophageal reflux disease)    Hyperlipidemia    Hypertension    Stroke (HCC)    hx opf mini strokes    TIA (transient ischemic attack)     Past Surgical History:  Procedure Laterality Date   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/13/2020   Procedure: CONVERSION TO LEFT TOTAL HIP-POSTERIOR APPROACH;  Surgeon: Durene Romans, MD;  Location: [facility];  Service: Orthopedics;  Laterality: Left;  90 mins   HIP FRACTURE SURGERY Left 04/16/2020   TIBIA IM NAIL INSERTION Right 01/15/2023   Procedure: INTRAMEDULLARY (IM) NAIL  RIGHT TIBIAL;  Surgeon: Myrene Galas, MD;  Location: [facility] OR;  Service: Orthopedics;  Laterality: Right;     Home Medications:  Prior to Admission medications   Medication Sig Start Date End Date Taking? Authorizing Provider  acetaminophen (TYLENOL) 500 MG tablet Take 2 tablets (1,000 mg total) by mouth every 8 (eight) hours. Patient taking differently: Take 1,000 mg by mouth every 8 (eight) hours as needed for mild pain (pain score 1-3), moderate pain (pain score 4-6) or headache. 09/16/20  Yes Babish, Molli Hazard, PA-C  albuterol (VENTOLIN HFA) 108 (90 Base) MCG/ACT inhaler Inhale 2 puffs into the lungs every 4 (four) hours as needed for wheezing or shortness of breath. 06/08/21  Yes [provider]  amiodarone (PACERONE) 200 MG tablet Take 1 tablet (200 mg total) by mouth daily. 01/07/24  Yes BranchAlben Spittle, MD  Ascorbic Acid (VITAMIN C) 1000 MG tablet Take 1,000 mg by mouth daily.   Yes [provider]  cetirizine (ZYRTEC) 10 MG tablet Take 10 mg by mouth daily.    Yes [provider]  Cholecalciferol (VITAMIN  D3) 125 MCG (5000 UT) TABS Take 5,000 Units by mouth daily.   Yes [provider]  cyanocobalamin 1000 MCG tablet Take 1,000 mcg by mouth every other day. Vitamin B12   Yes [provider]  diclofenac Sodium (VOLTAREN) 1 % GEL Apply 2 g topically daily as needed (pain).   Yes [provider]  diltiazem (CARDIZEM CD) 240 MG 24 hr capsule Take 1 capsule (240 mg total) by mouth daily. 12/05/22  Yes Burnadette Pop, MD  diltiazem (CARDIZEM) 30 MG tablet Take 30 mg by mouth as needed (Afib). 11/09/23  Yes [provider]  ELIQUIS 5 MG TABS tablet TAKE 1 TABLET BY MOUTH TWICE A DAY 07/16/22  Yes Allred, Fayrene Fearing, MD  furosemide (LASIX) 40 MG tablet Take 1 tablet (40 mg total) by mouth daily. 12/27/23 03/26/24 Yes BranchAlben Spittle, MD  gabapentin (NEURONTIN) 300 MG capsule Take 300-600 mg by mouth See admin instructions. Take one capsule (300 mg) in the morning and two capsules (600 mg) in the evening.   Yes [provider]  ipratropium-albuterol (DUONEB) 0.5-2.5 (3) MG/3ML SOLN Take 3 mLs by nebulization every 6 (six) hours as needed (shortness of breath). 12/04/22  Yes Burnadette Pop, MD  labetalol (NORMODYNE) 200 MG tablet Take 200 mg by mouth 2 (two) times daily.   Yes [provider]  methocarbamol (ROBAXIN) 750 MG tablet Take 750-1,500 mg by mouth See admin instructions. Take 750 mg in the morning and 1500 mg in the PM 03/06/22  Yes [provider]  mirtazapine (REMERON) 15 MG tablet Take 15 mg by mouth at bedtime. 02/27/22  Yes [provider]  Multiple Vitamin (MULTIVITAMIN WITH MINERALS) TABS tablet Take 1 tablet by mouth daily. Fish oil   Yes [provider]  Multiple Vitamins-Minerals (PRESERVISION AREDS 2 PO) Take 1 tablet by mouth 2 (two) times daily. [employer], Historical, MD  Omega-3 Fatty Acids (OMEGA-3 FISH OIL PO) Take 1 capsule by mouth daily.   Yes [provider]  potassium chloride SA  (KLOR-CON M20) 20 MEQ tablet Take 1 tablet by mouth daily.   Yes [provider]  pravastatin (PRAVACHOL) 20 MG tablet Take 20 mg by mouth at bedtime.   Yes [provider]  sucralfate (CARAFATE) 1 g tablet Take 1 g by mouth with breakfast, with lunch, and with evening meal.   Yes [provider]  Vitamin E 400 units TABS Take 400 Units by mouth daily. 07/14/21  Yes [provider]    Inpatient Medications: Scheduled Meds:  amiodarone  200 mg Oral BID   apixaban  5 mg Oral BID   diltiazem  240 mg Oral Daily   furosemide  40 mg Intravenous Once   [START ON 01/21/2024] furosemide  40 mg Oral BID   gabapentin  300 mg Oral Daily   And   gabapentin  600 mg Oral Daily   methocarbamol  750 mg Oral Daily   And   methocarbamol  1,500 mg Oral Daily   mirtazapine  15 mg Oral QHS   potassium chloride  40 mEq Oral Daily   pravastatin  20 mg Oral QHS   Continuous Infusions:  PRN Meds: acetaminophen **OR** acetaminophen, alum & mag hydroxide-simeth, ipratropium-albuterol, levalbuterol, melatonin, metoprolol tartrate, ondansetron **OR** ondansetron (ZOFRAN) IV, mouth rinse  Allergies:    Allergies  Allergen Reactions   Keflex [Cephalexin] Other (See Comments)    "makes pt feel really bad"  Tolerated Cephalosporin Date: 01/15/23.     Morphine Anxiety and Other (See Comments)    Pt states this made her feel like she was going to die    Social History:   Social History   Socioeconomic History   Marital status: Widowed    Spouse name: Not on file   Number of children: Not on file   Years of education: Not on file   Highest education level: Not on file  Occupational History   Not on file  Tobacco Use   Smoking status: Never   Smokeless tobacco: Never  Substance and Sexual Activity   Alcohol use: Never   Drug use: Never   Sexual activity: Not on file  Other Topics Concern   Not on file  Social History Narrative   Lives in [place] but currently  staying with daughter in [place].   homemaker   Social Drivers of Health   Financial Resource Strain: Not on file  Food Insecurity: No Food Insecurity (01/16/2024)   Hunger Vital Sign    Worried About Running Out of Food  in the Last Year: Never true    Ran Out of Food in the Last Year: Never true  Transportation Needs: No Transportation Needs (01/16/2024)   PRAPARE - Administrator, Civil Service (Medical): No    Lack of Transportation (Non-Medical): No  Physical Activity: Not on file  Stress: Not on file  Social Connections: Socially Isolated (01/16/2024)   Social Connection and Isolation Panel [NHANES]    Frequency of Communication with Friends and Family: More than three times a week    Frequency of Social Gatherings with Friends and Family: Never    Attends Religious Services: Never    Database administrator or Organizations: No    Attends Banker Meetings: Never    Marital Status: Widowed  Intimate Partner Violence: Not At Risk (01/16/2024)   Humiliation, Afraid, Rape, and Kick questionnaire    Fear of Current or Ex-Partner: No    Emotionally Abused: No    Physically Abused: No    Sexually Abused: No    Family History:    Family History  Problem Relation Age of Onset   Diabetes Mother      ROS:  Constitutional: Denied fever, chills, malaise, night sweats Eyes: Denied vision change or loss Ears/Nose/Mouth/Throat: Denied ear ache, sore throat, coughing, sinus pain Cardiovascular: see HPI  Respiratory: see HPI  Gastrointestinal: Denied nausea, vomiting, abdominal pain, diarrhea Genital/Urinary: Denied dysuria, hematuria, urinary frequency/urgency Musculoskeletal: Denied muscle ache, joint pain Skin: Denied rash, wound Neuro: Denied headache, dizziness, syncope Psych: Denied history of depression/anxiety  Endocrine: Denied history of diabetes   Physical Exam/Data:   Vitals:   01/19/24 2019 01/19/24 2340 01/20/24 0321 01/20/24 1300  BP:  (!) 128/97 (!) 130/97 (!) 131/92 118/83  Pulse: 100 (!) 105 98   Resp: (!) 24 (!) 24 18 18   Temp: 97.9 F (36.6 C) 97.9 F (36.6 C) 98 F (36.7 C) 98.4 F (36.9 C)  TempSrc: Oral Oral Axillary Oral  SpO2:  93% 93%   Weight:      Height:        Intake/Output Summary (Last 24 hours) at 01/20/2024 1706 Last data filed at 01/20/2024 0325 Gross per 24 hour  Intake --  Output 1345 ml  Net -1345 ml      01/15/2024    4:54 PM 12/27/2023    9:24 AM 11/25/2023    2:56 PM  Last 3 Weights  Weight (lbs) 205 lb 14.6 oz 206 lb 202 lb 9.6 oz  Weight (kg) 93.4 kg 93.441 kg 91.899 kg     Body mass index is 35.34 kg/m.  Vitals:  Vitals:   01/20/24 0321 01/20/24 1300  BP: (!) 131/92 118/83  Pulse: 98   Resp: 18 18  Temp: 98 F (36.7 C) 98.4 F (36.9 C)  SpO2: 93%    General Appearance: In no apparent distress, sitting in chair  HEENT: Normocephalic, atraumatic.  Neck: Supple, trachea midline,+ JVD  Cardiovascular: Irregularly irregular,S1S2, no murmur  Respiratory: Resting breathing unlabored, lungs sounds with fine crackles bilaterally, on [place] oxygen  Gastrointestinal: Bowel sounds positive, abdomen soft, non-tender Extremities: Able to move all extremities in bed without difficulty, trace edema of BLE Musculoskeletal: Normal muscle bulk and tone Skin: Intact, warm, dry. Neurologic: Alert, oriented to person, place and time.no cognitive deficit Psychiatric: Normal affect. Mood is appropriate.    EKG:  The EKG was personally reviewed and demonstrates:    EKG from 01/15/24 showed A fib, VR 93bpm, non-specific T wave abnormalities of  lateral leads   Telemetry:  Telemetry was personally reviewed and demonstrates:    Afib with RVR up to 120s   Relevant CV Studies:  Echo from 01/17/24:  1. Left ventricular ejection fraction, by estimation, is 55%. The left  ventricle has normal function. Left ventricular endocardial border not  optimally defined to evaluate regional wall motion. Left  ventricular  diastolic parameters are indeterminate.   2. Right ventricular systolic function is normal. The right ventricular  size is not well visualized. There is mildly elevated pulmonary artery  systolic pressure. The estimated right ventricular systolic pressure is  37.6 mmHg.   3. Left atrial size was severely dilated.   4. Right atrial size was mildly dilated.   5. The mitral valve is normal in structure. Trivial mitral valve  regurgitation. No evidence of mitral stenosis. Moderate mitral annular  calcification.   6. The aortic valve is tricuspid. There is mild calcification of the  aortic valve. Aortic valve regurgitation is not visualized. No aortic  stenosis is present.   7. Aortic dilatation noted. There is mild dilatation of the aortic root,  measuring 39 mm. There is mild dilatation of the ascending aorta,  measuring 39 mm.   8. The inferior vena cava is normal in size with greater than 50%  respiratory variability, suggesting right atrial pressure of 3 mmHg.   9. The patient was in atrial fibrillation.   Echo from 09/27/22:   1. Left ventricular ejection fraction, by estimation, is 60 to 65%. The  left ventricle has normal function. The left ventricle has no regional  wall motion abnormalities. Left ventricular diastolic parameters were  normal.   2. Right ventricular systolic function is normal. The right ventricular  size is normal. There is moderately elevated pulmonary artery systolic  pressure.   3. Left atrial size was moderately dilated.   4. The mitral valve is normal in structure. Trivial mitral valve  regurgitation. No evidence of mitral stenosis. Severe mitral annular  calcification.   5. The aortic valve is tricuspid. There is moderate calcification of the  aortic valve. Aortic valve regurgitation is not visualized. Aortic valve  sclerosis/calcification is present, without any evidence of aortic  stenosis.   6. The inferior vena cava is normal in size  with greater than 50%  respiratory variability, suggesting right atrial pressure of 3 mmHg.   Laboratory Data:  High Sensitivity Troponin:   Recent Labs  Lab 01/15/24 1719 01/15/24 1919  TROPONINIHS 11 11     Chemistry Recent Labs  Lab 01/17/24 0504 01/18/24 0441 01/20/24 0517  NA 140 140 140  K 3.5 4.9 3.9  CL 100 107 97*  CO2 29 28 31   GLUCOSE 122* 118* 109*  BUN 10 13 11   CREATININE 0.91 0.77 0.91  CALCIUM 9.2 9.1 9.0  MG 2.0 2.1 2.0  GFRNONAA >60 >60 >60  ANIONGAP 11 5 12     Recent Labs  Lab 01/17/24 0504 01/18/24 0441 01/20/24 0517  PROT 5.8* 5.9* 5.5*  ALBUMIN 3.4* 3.3* 3.1*  AST 16 13* 18  ALT 20 20 20   ALKPHOS 60 57 60  BILITOT 0.5 0.3 0.6   Lipids No results for input(s): "CHOL", "TRIG", "HDL", "LABVLDL", "LDLCALC", "CHOLHDL" in the last 168 hours.  Hematology Recent Labs  Lab 01/17/24 0504 01/18/24 0441 01/20/24 0517  WBC 9.2 9.2 8.4  RBC 4.03 3.93 3.99  HGB 11.5* 11.0* 11.2*  HCT 35.6* 35.2* 35.4*  MCV 88.3 89.6 88.7  MCH 28.5 28.0 28.1  MCHC 32.3 31.3 31.6  RDW 14.5 14.4 14.1  PLT 230 226 224   Thyroid No results for input(s): "TSH", "FREET4" in the last 168 hours.  BNP Recent Labs  Lab 01/15/24 1720  BNP 202.5*    DDimer No results for input(s): "DDIMER" in the last 168 hours.   Radiology/Studies:  US THORACENTESIS ASP PLEURAL SPACE W/IMG GUIDE Result Date: 01/19/2024 INDICATION: 89 year old female. History of atrial fibrillation. Presented to ED with chest pain. Found to have a right-sided pleural effusion. Request is for therapeutic and diagnostic thoracentesis EXAM: ULTRASOUND GUIDED THERAPEUTIC AND DIAGNOSTIC RIGHT-SIDED THORACENTESIS MEDICATIONS: Lidocaine 1% 10 mL COMPLICATIONS: None immediate. PROCEDURE: An ultrasound guided thoracentesis was thoroughly discussed with the patient and questions answered. The benefits, risks, alternatives and complications were also discussed. The patient understands and wishes to proceed with  the procedure. Written consent was obtained. Ultrasound was performed to localize and mark an adequate pocket of fluid in the right chest. The area was then prepped and draped in the normal sterile fashion. 1% Lidocaine was used for local anesthesia. Under ultrasound guidance a 6 Fr Safe-T-Centesis catheter was introduced. Thoracentesis was performed. The catheter was removed and a dressing applied. FINDINGS: A total of approximately 700 mL of straw-colored fluid was removed. Samples were sent to the laboratory as requested by the clinical team. IMPRESSION: Successful ultrasound guided therapeutic and diagnostic right-sided thoracentesis yielding 700 mL of pleural fluid. Performed by Anders Grant NP Electronically Signed   By: Marliss Coots M.D.   On: 01/19/2024 13:13   DG Chest 1 View Result Date: 01/19/2024 CLINICAL DATA:  Status post right thoracentesis, pleural effusion EXAM: CHEST  1 VIEW COMPARISON:  01/18/2024 FINDINGS: Single frontal view of the chest demonstrates stable enlargement the cardiac silhouette. Near complete resolution of the right pleural effusion seen previously. Patchy airspace disease within the right mid and upper lung zones unchanged. No pneumothorax. No acute bony abnormalities. IMPRESSION: 1. Near complete resolution of right pleural effusion after thoracentesis. No evidence of pneumothorax. 2. Persistent right mid and upper lung zone airspace disease. Electronically Signed   By: Sharlet Salina M.D.   On: 01/19/2024 12:00   DG Chest 2 View Result Date: 01/18/2024 CLINICAL DATA:  Viral pneumonia. EXAM: CHEST - 2 VIEW COMPARISON:  Chest radiograph dated 01/15/2024. FINDINGS: The heart size and mediastinal contours are within normal limits. There is moderate right airspace opacities and atelectasis. A right pleural effusion may contribute. The left lung is clear. No pneumothorax. IMPRESSION: Moderate right airspace opacities and atelectasis consistent with pneumonia. Electronically  Signed   By: Romona Curls M.D.   On: 01/18/2024 11:56   ECHOCARDIOGRAM COMPLETE Result Date: 01/17/2024    ECHOCARDIOGRAM REPORT   Patient Name:   [name] Date of Exam: 01/17/2024 Medical Rec #:  [id number]      Height:       64.0 in Accession #:    [id number]     Weight:       205.9 lb Date of Birth:  Mar 30, 1933      BSA:          1.981 m Patient Age:    89 years       BP:           107/71 mmHg Patient Gender: F              HR:           77 bpm. Exam Location:  Inpatient Procedure: 2D Echo, Cardiac Doppler and Color  Doppler (Both Spectral and Color            Flow Doppler were utilized during procedure). Indications:    CHF-acute diastolic  History:        Patient has prior history of Echocardiogram examinations, most                 recent 09/27/2022. COPD, Arrythmias:Atrial Fibrillation,                 Signs/Symptoms:Dyspnea; Risk Factors:Hypertension and                 Dyslipidemia.  Sonographer:    Vern Claude Referring Phys: [id number] ERIC CHEN IMPRESSIONS  1. Left ventricular ejection fraction, by estimation, is 55%. The left ventricle has normal function. Left ventricular endocardial border not optimally defined to evaluate regional wall motion. Left ventricular diastolic parameters are indeterminate.  2. Right ventricular systolic function is normal. The right ventricular size is not well visualized. There is mildly elevated pulmonary artery systolic pressure. The estimated right ventricular systolic pressure is 37.6 mmHg.  3. Left atrial size was severely dilated.  4. Right atrial size was mildly dilated.  5. The mitral valve is normal in structure. Trivial mitral valve regurgitation. No evidence of mitral stenosis. Moderate mitral annular calcification.  6. The aortic valve is tricuspid. There is mild calcification of the aortic valve. Aortic valve regurgitation is not visualized. No aortic stenosis is present.  7. Aortic dilatation noted. There is mild dilatation of the aortic root, measuring 39  mm. There is mild dilatation of the ascending aorta, measuring 39 mm.  8. The inferior vena cava is normal in size with greater than 50% respiratory variability, suggesting right atrial pressure of 3 mmHg.  9. The patient was in atrial fibrillation. FINDINGS  Left Ventricle: Left ventricular ejection fraction, by estimation, is 55%. The left ventricle has normal function. Left ventricular endocardial border not optimally defined to evaluate regional wall motion. The left ventricular internal cavity size was normal in size. There is no left ventricular hypertrophy. Left ventricular diastolic parameters are indeterminate. Right Ventricle: The right ventricular size is not well visualized. Right vetricular wall thickness was not well visualized. Right ventricular systolic function is normal. There is mildly elevated pulmonary artery systolic pressure. The tricuspid regurgitant velocity is 2.94 m/s, and with an assumed right atrial pressure of 3 mmHg, the estimated right ventricular systolic pressure is 37.6 mmHg. Left Atrium: Left atrial size was severely dilated. Right Atrium: Right atrial size was mildly dilated. Pericardium: There is no evidence of pericardial effusion. Mitral Valve: The mitral valve is normal in structure. Moderate mitral annular calcification. Trivial mitral valve regurgitation. No evidence of mitral valve stenosis. MV peak gradient, 3.9 mmHg. The mean mitral valve gradient is 2.0 mmHg. Tricuspid Valve: The tricuspid valve is normal in structure. Tricuspid valve regurgitation is mild. Aortic Valve: The aortic valve is tricuspid. There is mild calcification of the aortic valve. Aortic valve regurgitation is not visualized. No aortic stenosis is present. Aortic valve mean gradient measures 2.7 mmHg. Aortic valve peak gradient measures 4.1 mmHg. Aortic valve area, by VTI measures 2.13 cm. Pulmonic Valve: The pulmonic valve was normal in structure. Pulmonic valve regurgitation is not visualized.  Aorta: Aortic dilatation noted. There is mild dilatation of the aortic root, measuring 39 mm. There is mild dilatation of the ascending aorta, measuring 39 mm. Venous: The inferior vena cava is normal in size with greater than 50% respiratory variability, suggesting right atrial pressure of  3 mmHg. IAS/Shunts: No atrial level shunt detected by color flow Doppler.  LEFT VENTRICLE PLAX 2D LVIDd:         5.20 cm LVIDs:         3.60 cm LV PW:         0.80 cm LV IVS:        0.80 cm LVOT diam:     2.00 cm LV SV:         35 LV SV Index:   18 LVOT Area:     3.14 cm  LV Volumes (MOD) LV vol d, MOD A2C: 90.8 ml LV vol d, MOD A4C: 110.0 ml LV vol s, MOD A2C: 39.2 ml LV vol s, MOD A4C: 46.0 ml LV SV MOD A2C:     51.6 ml LV SV MOD A4C:     110.0 ml LV SV MOD BP:      60.8 ml RIGHT VENTRICLE             IVC RV Basal diam:  3.40 cm     IVC diam: 1.60 cm RV Mid diam:    2.00 cm RV S prime:     11.10 cm/s LEFT ATRIUM              Index        RIGHT ATRIUM           Index LA diam:        5.40 cm  2.73 cm/m   RA Area:     17.80 cm LA Vol (A2C):   114.0 ml 57.55 ml/m  RA Volume:   46.50 ml  23.48 ml/m LA Vol (A4C):   97.2 ml  49.07 ml/m LA Biplane Vol: 108.0 ml 54.53 ml/m  AORTIC VALVE                    PULMONIC VALVE AV Area (Vmax):    1.90 cm     PV Vmax:       0.90 m/s AV Area (Vmean):   1.87 cm     PV Peak grad:  3.2 mmHg AV Area (VTI):     2.13 cm AV Vmax:           101.10 cm/s AV Vmean:          72.133 cm/s AV VTI:            0.163 m AV Peak Grad:      4.1 mmHg AV Mean Grad:      2.7 mmHg LVOT Vmax:         61.25 cm/s LVOT Vmean:        42.850 cm/s LVOT VTI:          0.110 m LVOT/AV VTI ratio: 0.68  AORTA Ao Root diam: 3.90 cm Ao Asc diam:  3.90 cm MITRAL VALVE               TRICUSPID VALVE MV Area (PHT): 4.68 cm    TR Peak grad:   34.6 mmHg MV Area VTI:   3.83 cm    TR Vmax:        294.00 cm/s MV Peak grad:  3.9 mmHg MV Mean grad:  2.0 mmHg    SHUNTS MV Vmax:       0.98 m/s    Systemic VTI:  0.11 m MV Vmean:       57.2 cm/s   Systemic Diam: 2.00 cm MV Decel Time: 162 msec MV E velocity: 90.00  cm/s Dalton McleanMD Electronically signed by Wilfred Lacy Signature Date/Time: 01/17/2024/10:55:46 AM    Final      Assessment and Plan:   Persistent A fib RVR  - presented with SOB and chest discomfort, found to have pneumonia, A fib RVR - on PTA cardizem 240mg , labetalol 200mg  BID, and amiodarone 200mg  daily  - TSH WNL on 12/27/23  - Echo with LVEF 55%, not optimal for WM assessment, indeterminate diastolic, normal RV, PASP 37.6 mmHg, severe LAE, mild RAE, trivial MR, mod MAC, mild dilatation of the aortic root 39 mm. There is mild dilatation of the ascending aorta 39 mm. IVC normal.  - Likely etiology is acute illness + underlying lung disease + albuterol use; not symptomatic today  - s/p cardizem gtt and amiodarone re-load by hospitalist, rate control improved but increased HR with activity, currently RVR up to 120s at rest, currently on amiodarone 400mg  BID, cardizem 240mg  daily, and PRN IV metoprolol; will reduce PO amiodarone to 200mg  BID, continue cardizem to 240mg  daily, will plan for DCCV tomorrow, potential to add bisoprolol and stop PTA labetalol if needed going forward   - continue DOAC Eliquis 5mg  BID, she states has been compliant   Acute on chronic diastolic heart failure  - BNP 914 on 01/15/24  - CXR showed RUL pneumonia 01/15/24 - CT chest 01/16/24 showed small-moderate sized right-sided pleural effusion, increased. Dependent right basilar atelectasis and patchy airspace opacity within the more superior aspect of the right lower lobe.Additionally, there is interlobular septal thickening and patchy ground-glass attenuation throughout the right upper lobe. - Echo as above - clinical presentation likely multifactorial from pneumonia, CHF, A fib RVR, pleural effusion  - maybe tachycardia mediated  - on PTA Lasix 40mg  daily, now on PO 40mg  BID, UOP 1.3L over the past 24 hours, weight not recorded,  clinically remains hypervolemic, recommend IV Lasix 40mg  x1 now - GDMT: would switch labetalol to bisoprolol if no significant pause or bradycardia after DCCV,  intolerant to spironolactone in the past, may add SGLT2I Jardiance before discharge, if BP allows, would also try add Entresto   Pneumonia  Right pleural effusion s/p thoracentesis  HTN Obesity COPD - per primary team       Risk Assessment/Risk Scores:    New York Heart Association (NYHA) Functional Class NYHA Class II  CHA2DS2-VASc Score = 5  This indicates a 7.2% annual risk of stroke. The patient's score is based upon: CHF History: 1 HTN History: 1 Diabetes History: 0 Stroke History: 0 Vascular Disease History: 0 Age Score: 2 Gender Score: 1     For questions or updates, please contact [facility] Please consult [email or web address] for contact info under    Signed, Cyndi Bender, NP  01/20/2024 5:06 PM  Patient seen and examined.  Agree with above documentation.  [name] is an 89 year old female with a history of paroxysmal atrial fibrillation, hypertension, CVA, diastolic heart failure, COPD who were consulted for evaluation of A-fib with RVR at the request of Dr. Tyson Babinski.  She follows with Dr. Wyline Mood, has history of A-fib.  During hospitalization 11/2022 for pneumonia/flu, found to have A-fib with RVR.  She was started on amiodarone and converted.  She was discharged on Eliquis, diltiazem, and labetalol.  She was back in A-fib at appointment in A-fib clinic 12/18/2022.  Cardioversion was offered but she declined at that time.  Follow-up 12/2022 she was in sinus rhythm.  Amiodarone was discontinued 08/2023 as she was again in sinus rhythm.  She saw  Dr. Lilia Pro 1 6/25 and noted to be in A-fib.  DCCV was arranged but canceled on 11/28/2023 as she had converted back to sinus rhythm.  At appointment 12/27/2023 she was back in A-fib.  She was reloaded with amiodarone.  She presented to the ED with shortness of breath on  01/15/2024.  Labs notable for troponin 11 > 11, BNP 202, lactate 0.8.  CT chest showed small to moderate right pleural effusion, patchy airspace opacity that could represent asymmetric edema versus pneumonia.  Procalcitonin was negative.  She underwent thoracentesis done 01/19/2024 with 700 cc removed.  She was treated with IV Lasix.  She was in A-fib with RVR on admission, started on Cardizem drip and amiodarone.  Currently on amiodarone 400 mg twice daily and Cardizem 240 mg daily.  Noted to have elevated heart rate with minimal activity.  Most recent vital signs show BP 118/83, pulse 104, SpO2 93% on 1 L.  Labs from this morning show creatinine 0.9.  On exam, patient is alert and oriented, tachycardic, regular, no murmurs, expiratory wheezing, no LE edema or JVD.  For her atrial fibrillation, she has had persistent A-fib during admission which has been difficult to rate control.  In addition suspect presentation with decompensated heart failure likely due to A-fib with RVR.  She has been loaded with amiodarone but remains in A-fib.  Recommend cardioversion to maintain sinus rhythm.  Discussed with patient and her daughter/son-in-law, and they are agreeable to proceeding tomorrow.  Denies any missed doses of Eliquis over the last 3 weeks.  Little Ishikawa, MD

## 2024-01-20 NOTE — Progress Notes (Signed)
 Mobility Specialist Progress Note;   01/20/24 1450  Mobility  Activity Ambulated with assistance in room  Level of Assistance Minimal assist, patient does 75% or more  Assistive Device Front wheel walker  Distance Ambulated (ft) 10 ft  Activity Response Tolerated well  Mobility Referral Yes  Mobility visit 1 Mobility  Mobility Specialist Start Time (ACUTE ONLY) 1450  Mobility Specialist Stop Time (ACUTE ONLY) 1500  Mobility Specialist Time Calculation (min) (ACUTE ONLY) 10 min   Pt agreeable to mobility in room. Required MinA to stand from chair and perform marches in place. Pt was able to take a few steps forward and backward before giving out d/t pain in R knee. VSS on 1LO2. HR up to 118bpm w/ activity. Pt returned back to chair with all needs met. Son in room.   Caesar Bookman Mobility Specialist Please contact via SecureChat or [employer] [phone]

## 2024-01-20 NOTE — Consult Note (Addendum)
 Cardiology Consultation   Patient ID: Berline Semrad MRN: [id number]; DOB: 1933/10/02  Admit date: 01/15/2024 Date of Consult: 01/20/2024  PCP:  Hadley Pen, MD   [facility] Providers Cardiologist:  Maisie Fus, MD   {   Patient Profile:   Jenaye Rickert is a 89 y.o. female with a hx of hypertension, prior TIA/stroke, hyperlipidemia, GERD, paroxysmal afib, diastolic heart failure, COPD, obesity,  who is being seen 01/20/2024 for the evaluation of A fib RVR at the request of Dr Tyson Babinski.  History of Present Illness:   Ms. Teodoro with above PMH presented to ER on 01/15/24 for 3 days of SOB and chest discomfort. Labs from 01/15/24 showed BNP 202. Hs trop 11 >11. CBC and BMP unremarkable. Lactic acid 0.8. CXR showed right upper lobe airspace opacity concerning for pneumonia. CT chest 01/16/24 showed Small-moderate sized right-sided pleural effusion, dependent right basilar atelectasis and patchy airspace opacity within the more superior aspect of the right lower lobe. Additionally, there is interlobular septal thickening and patchy ground-glass attenuation throughout the right upper lobe. Findings may represent a combination of asymmetric edema with atelectasis and pneumonia. Procal subsequently was negative. She was admitted to hospitlaist for CAP, felt due to viral cause, antibiotic was stopped. She underwent right thoracentesis on 01/19/24 with fluid removed. Labs not done on 01/19/24 to determine light's criteria. She was felt volume overloaded and given IV Lasix for CHF. She was noted in A fib RVR at admission, was given cardizem gtt and repeated amiodarone load, currently on amiodarone 400mg  BID, and continued on PTA Cardizem 240mg . His heart rate is elevated with minimal activity. Cardiology is consulted today for further evaluation.   Upon encounter, patient is sitting in chair. She states she normally has some SOB, heart palpitation, fatigue, and feeling weak with her A  fib. She recalls having increased SOB with exertion and leg/feet swelling before she came in to the ER on 01/15/24. She was taking her lasix 40mg  daily but still had trouble urinating. She is not normally on oxygen and is currently on [place] oxygen. She felt overall improves, has ambulated in the room without issue today. She states she is not feeling much of her A fib today, denied any chest pain, worsening of SOB, palpitation, dizziness, syncope. She wants to go home.    Per chart review, she follows Dr Wyline Mood, has a history of paroxysmal atrial fibrillation and has been maintained on Labetalol, Diltiazem, and Eliquis.   She had hospitalization 11/2022 for respiratory failure due to flu and pneumonia, course complicated by A fib RVR. She was started on amiodarone and subsequent conversion, continued on diltiazem 240 and labetalol 200mg  BID and Eliquis at time of discharge.   She followed up at A fib clinic 12/18/22, back in Afib, advised to continue amiodarone and labetalol and diltiazem 240mg . She was offered DCCV by Dr Wyline Mood that time but her son preferred medical therapy.  She was seen by cardiology 01/14/23 for pre-op for right tib/fib fracture. She was in sinus rhythm on above regimen.      Amiodarone was stopped 08/21/23 office visit as she remained in sinus.   She had ongoing SOB and heart palpitation since 10/2023 with on and off A fib. She saw Dr Wyline Mood 11/25/23, was arranged for DCCV, this was cancelled on 11/28/23 as she was back in sinus rhythm.   She last followed up on 12/27/23 in the office, back in A fib and was symptomatic, also had some PND  and orthopnea. She was re-loaded with amiodarone, also started on lasix 40mg  for CHF.   She has chronic diastolic heart failure, did not tolerate spironolactone due to hyperkalemia in the past. Echo from 01/17/24 showed LVEF 55%, not optimal for WM assessment, indeterminate diastolic, normal RV, PASP 37.6 mmHg, severe LAE, mild RAE, trivial MR, mod MAC, mild  dilatation of the aortic root 39 mm. There is mild dilatation of the ascending aorta 39 mm. IVC normal.     Past Medical History:  Diagnosis Date   Arthritis    Atrial fibrillation, transient (HCC)    post op hip surgery; May 2021   Closed fracture of right fibula and tibia 01/13/2023   Diverticulitis    Dyspnea    with exertion    GERD (gastroesophageal reflux disease)    Hyperlipidemia    Hypertension    Stroke (HCC)    hx opf mini strokes    TIA (transient ischemic attack)     Past Surgical History:  Procedure Laterality Date   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/13/2020   Procedure: CONVERSION TO LEFT TOTAL HIP-POSTERIOR APPROACH;  Surgeon: Durene Romans, MD;  Location: [facility];  Service: Orthopedics;  Laterality: Left;  90 mins   HIP FRACTURE SURGERY Left 04/16/2020   TIBIA IM NAIL INSERTION Right 01/15/2023   Procedure: INTRAMEDULLARY (IM) NAIL  RIGHT TIBIAL;  Surgeon: Myrene Galas, MD;  Location: [facility] OR;  Service: Orthopedics;  Laterality: Right;     Home Medications:  Prior to Admission medications   Medication Sig Start Date End Date Taking? Authorizing Provider  acetaminophen (TYLENOL) 500 MG tablet Take 2 tablets (1,000 mg total) by mouth every 8 (eight) hours. Patient taking differently: Take 1,000 mg by mouth every 8 (eight) hours as needed for mild pain (pain score 1-3), moderate pain (pain score 4-6) or headache. 09/16/20  Yes Babish, Molli Hazard, PA-C  albuterol (VENTOLIN HFA) 108 (90 Base) MCG/ACT inhaler Inhale 2 puffs into the lungs every 4 (four) hours as needed for wheezing or shortness of breath. 06/08/21  Yes [provider]  amiodarone (PACERONE) 200 MG tablet Take 1 tablet (200 mg total) by mouth daily. 01/07/24  Yes BranchAlben Spittle, MD  Ascorbic Acid (VITAMIN C) 1000 MG tablet Take 1,000 mg by mouth daily.   Yes [provider]  cetirizine (ZYRTEC) 10 MG tablet Take 10 mg by mouth daily.    Yes [provider]  Cholecalciferol (VITAMIN  D3) 125 MCG (5000 UT) TABS Take 5,000 Units by mouth daily.   Yes [provider]  cyanocobalamin 1000 MCG tablet Take 1,000 mcg by mouth every other day. Vitamin B12   Yes [provider]  diclofenac Sodium (VOLTAREN) 1 % GEL Apply 2 g topically daily as needed (pain).   Yes [provider]  diltiazem (CARDIZEM CD) 240 MG 24 hr capsule Take 1 capsule (240 mg total) by mouth daily. 12/05/22  Yes Burnadette Pop, MD  diltiazem (CARDIZEM) 30 MG tablet Take 30 mg by mouth as needed (Afib). 11/09/23  Yes [provider]  ELIQUIS 5 MG TABS tablet TAKE 1 TABLET BY MOUTH TWICE A DAY 07/16/22  Yes Allred, Fayrene Fearing, MD  furosemide (LASIX) 40 MG tablet Take 1 tablet (40 mg total) by mouth daily. 12/27/23 03/26/24 Yes BranchAlben Spittle, MD  gabapentin (NEURONTIN) 300 MG capsule Take 300-600 mg by mouth See admin instructions. Take one capsule (300 mg) in the morning and two capsules (600 mg) in the evening.   Yes [provider]  ipratropium-albuterol (DUONEB) 0.5-2.5 (3) MG/3ML SOLN Take 3 mLs by nebulization every 6 (six) hours as needed (shortness of breath). 12/04/22  Yes Burnadette Pop, MD  labetalol (NORMODYNE) 200 MG tablet Take 200 mg by mouth 2 (two) times daily.   Yes [provider]  methocarbamol (ROBAXIN) 750 MG tablet Take 750-1,500 mg by mouth See admin instructions. Take 750 mg in the morning and 1500 mg in the PM 03/06/22  Yes [provider]  mirtazapine (REMERON) 15 MG tablet Take 15 mg by mouth at bedtime. 02/27/22  Yes [provider]  Multiple Vitamin (MULTIVITAMIN WITH MINERALS) TABS tablet Take 1 tablet by mouth daily. Fish oil   Yes [provider]  Multiple Vitamins-Minerals (PRESERVISION AREDS 2 PO) Take 1 tablet by mouth 2 (two) times daily. [employer], Historical, MD  Omega-3 Fatty Acids (OMEGA-3 FISH OIL PO) Take 1 capsule by mouth daily.   Yes [provider]  potassium chloride SA  (KLOR-CON M20) 20 MEQ tablet Take 1 tablet by mouth daily.   Yes [provider]  pravastatin (PRAVACHOL) 20 MG tablet Take 20 mg by mouth at bedtime.   Yes [provider]  sucralfate (CARAFATE) 1 g tablet Take 1 g by mouth with breakfast, with lunch, and with evening meal.   Yes [provider]  Vitamin E 400 units TABS Take 400 Units by mouth daily. 07/14/21  Yes [provider]    Inpatient Medications: Scheduled Meds:  amiodarone  200 mg Oral BID   apixaban  5 mg Oral BID   diltiazem  240 mg Oral Daily   furosemide  40 mg Intravenous Once   [START ON 01/21/2024] furosemide  40 mg Oral BID   gabapentin  300 mg Oral Daily   And   gabapentin  600 mg Oral Daily   methocarbamol  750 mg Oral Daily   And   methocarbamol  1,500 mg Oral Daily   mirtazapine  15 mg Oral QHS   potassium chloride  40 mEq Oral Daily   pravastatin  20 mg Oral QHS   Continuous Infusions:  PRN Meds: acetaminophen **OR** acetaminophen, alum & mag hydroxide-simeth, ipratropium-albuterol, levalbuterol, melatonin, metoprolol tartrate, ondansetron **OR** ondansetron (ZOFRAN) IV, mouth rinse  Allergies:    Allergies  Allergen Reactions   Keflex [Cephalexin] Other (See Comments)    "makes pt feel really bad"  Tolerated Cephalosporin Date: 01/15/23.     Morphine Anxiety and Other (See Comments)    Pt states this made her feel like she was going to die    Social History:   Social History   Socioeconomic History   Marital status: Widowed    Spouse name: Not on file   Number of children: Not on file   Years of education: Not on file   Highest education level: Not on file  Occupational History   Not on file  Tobacco Use   Smoking status: Never   Smokeless tobacco: Never  Substance and Sexual Activity   Alcohol use: Never   Drug use: Never   Sexual activity: Not on file  Other Topics Concern   Not on file  Social History Narrative   Lives in [place] but currently  staying with daughter in [place].   homemaker   Social Drivers of Health   Financial Resource Strain: Not on file  Food Insecurity: No Food Insecurity (01/16/2024)   Hunger Vital Sign    Worried About Running Out of Food  in the Last Year: Never true    Ran Out of Food in the Last Year: Never true  Transportation Needs: No Transportation Needs (01/16/2024)   PRAPARE - Administrator, Civil Service (Medical): No    Lack of Transportation (Non-Medical): No  Physical Activity: Not on file  Stress: Not on file  Social Connections: Socially Isolated (01/16/2024)   Social Connection and Isolation Panel [NHANES]    Frequency of Communication with Friends and Family: More than three times a week    Frequency of Social Gatherings with Friends and Family: Never    Attends Religious Services: Never    Database administrator or Organizations: No    Attends Banker Meetings: Never    Marital Status: Widowed  Intimate Partner Violence: Not At Risk (01/16/2024)   Humiliation, Afraid, Rape, and Kick questionnaire    Fear of Current or Ex-Partner: No    Emotionally Abused: No    Physically Abused: No    Sexually Abused: No    Family History:    Family History  Problem Relation Age of Onset   Diabetes Mother      ROS:  Constitutional: Denied fever, chills, malaise, night sweats Eyes: Denied vision change or loss Ears/Nose/Mouth/Throat: Denied ear ache, sore throat, coughing, sinus pain Cardiovascular: see HPI  Respiratory: see HPI  Gastrointestinal: Denied nausea, vomiting, abdominal pain, diarrhea Genital/Urinary: Denied dysuria, hematuria, urinary frequency/urgency Musculoskeletal: Denied muscle ache, joint pain Skin: Denied rash, wound Neuro: Denied headache, dizziness, syncope Psych: Denied history of depression/anxiety  Endocrine: Denied history of diabetes   Physical Exam/Data:   Vitals:   01/19/24 2019 01/19/24 2340 01/20/24 0321 01/20/24 1300  BP:  (!) 128/97 (!) 130/97 (!) 131/92 118/83  Pulse: 100 (!) 105 98   Resp: (!) 24 (!) 24 18 18   Temp: 97.9 F (36.6 C) 97.9 F (36.6 C) 98 F (36.7 C) 98.4 F (36.9 C)  TempSrc: Oral Oral Axillary Oral  SpO2:  93% 93%   Weight:      Height:        Intake/Output Summary (Last 24 hours) at 01/20/2024 1706 Last data filed at 01/20/2024 0325 Gross per 24 hour  Intake --  Output 1345 ml  Net -1345 ml      01/15/2024    4:54 PM 12/27/2023    9:24 AM 11/25/2023    2:56 PM  Last 3 Weights  Weight (lbs) 205 lb 14.6 oz 206 lb 202 lb 9.6 oz  Weight (kg) 93.4 kg 93.441 kg 91.899 kg     Body mass index is 35.34 kg/m.  Vitals:  Vitals:   01/20/24 0321 01/20/24 1300  BP: (!) 131/92 118/83  Pulse: 98   Resp: 18 18  Temp: 98 F (36.7 C) 98.4 F (36.9 C)  SpO2: 93%    General Appearance: In no apparent distress, sitting in chair  HEENT: Normocephalic, atraumatic.  Neck: Supple, trachea midline,+ JVD  Cardiovascular: Irregularly irregular,S1S2, no murmur  Respiratory: Resting breathing unlabored, lungs sounds with fine crackles bilaterally, on [place] oxygen  Gastrointestinal: Bowel sounds positive, abdomen soft, non-tender Extremities: Able to move all extremities in bed without difficulty, trace edema of BLE Musculoskeletal: Normal muscle bulk and tone Skin: Intact, warm, dry. Neurologic: Alert, oriented to person, place and time.no cognitive deficit Psychiatric: Normal affect. Mood is appropriate.    EKG:  The EKG was personally reviewed and demonstrates:    EKG from 01/15/24 showed A fib, VR 93bpm, non-specific T wave abnormalities of  lateral leads   Telemetry:  Telemetry was personally reviewed and demonstrates:    Afib with RVR up to 120s   Relevant CV Studies:  Echo from 01/17/24:  1. Left ventricular ejection fraction, by estimation, is 55%. The left  ventricle has normal function. Left ventricular endocardial border not  optimally defined to evaluate regional wall motion. Left  ventricular  diastolic parameters are indeterminate.   2. Right ventricular systolic function is normal. The right ventricular  size is not well visualized. There is mildly elevated pulmonary artery  systolic pressure. The estimated right ventricular systolic pressure is  37.6 mmHg.   3. Left atrial size was severely dilated.   4. Right atrial size was mildly dilated.   5. The mitral valve is normal in structure. Trivial mitral valve  regurgitation. No evidence of mitral stenosis. Moderate mitral annular  calcification.   6. The aortic valve is tricuspid. There is mild calcification of the  aortic valve. Aortic valve regurgitation is not visualized. No aortic  stenosis is present.   7. Aortic dilatation noted. There is mild dilatation of the aortic root,  measuring 39 mm. There is mild dilatation of the ascending aorta,  measuring 39 mm.   8. The inferior vena cava is normal in size with greater than 50%  respiratory variability, suggesting right atrial pressure of 3 mmHg.   9. The patient was in atrial fibrillation.   Echo from 09/27/22:   1. Left ventricular ejection fraction, by estimation, is 60 to 65%. The  left ventricle has normal function. The left ventricle has no regional  wall motion abnormalities. Left ventricular diastolic parameters were  normal.   2. Right ventricular systolic function is normal. The right ventricular  size is normal. There is moderately elevated pulmonary artery systolic  pressure.   3. Left atrial size was moderately dilated.   4. The mitral valve is normal in structure. Trivial mitral valve  regurgitation. No evidence of mitral stenosis. Severe mitral annular  calcification.   5. The aortic valve is tricuspid. There is moderate calcification of the  aortic valve. Aortic valve regurgitation is not visualized. Aortic valve  sclerosis/calcification is present, without any evidence of aortic  stenosis.   6. The inferior vena cava is normal in size  with greater than 50%  respiratory variability, suggesting right atrial pressure of 3 mmHg.   Laboratory Data:  High Sensitivity Troponin:   Recent Labs  Lab 01/15/24 1719 01/15/24 1919  TROPONINIHS 11 11     Chemistry Recent Labs  Lab 01/17/24 0504 01/18/24 0441 01/20/24 0517  NA 140 140 140  K 3.5 4.9 3.9  CL 100 107 97*  CO2 29 28 31   GLUCOSE 122* 118* 109*  BUN 10 13 11   CREATININE 0.91 0.77 0.91  CALCIUM 9.2 9.1 9.0  MG 2.0 2.1 2.0  GFRNONAA >60 >60 >60  ANIONGAP 11 5 12     Recent Labs  Lab 01/17/24 0504 01/18/24 0441 01/20/24 0517  PROT 5.8* 5.9* 5.5*  ALBUMIN 3.4* 3.3* 3.1*  AST 16 13* 18  ALT 20 20 20   ALKPHOS 60 57 60  BILITOT 0.5 0.3 0.6   Lipids No results for input(s): "CHOL", "TRIG", "HDL", "LABVLDL", "LDLCALC", "CHOLHDL" in the last 168 hours.  Hematology Recent Labs  Lab 01/17/24 0504 01/18/24 0441 01/20/24 0517  WBC 9.2 9.2 8.4  RBC 4.03 3.93 3.99  HGB 11.5* 11.0* 11.2*  HCT 35.6* 35.2* 35.4*  MCV 88.3 89.6 88.7  MCH 28.5 28.0 28.1  MCHC 32.3 31.3 31.6  RDW 14.5 14.4 14.1  PLT 230 226 224   Thyroid No results for input(s): "TSH", "FREET4" in the last 168 hours.  BNP Recent Labs  Lab 01/15/24 1720  BNP 202.5*    DDimer No results for input(s): "DDIMER" in the last 168 hours.   Radiology/Studies:  US THORACENTESIS ASP PLEURAL SPACE W/IMG GUIDE Result Date: 01/19/2024 INDICATION: 89 year old female. History of atrial fibrillation. Presented to ED with chest pain. Found to have a right-sided pleural effusion. Request is for therapeutic and diagnostic thoracentesis EXAM: ULTRASOUND GUIDED THERAPEUTIC AND DIAGNOSTIC RIGHT-SIDED THORACENTESIS MEDICATIONS: Lidocaine 1% 10 mL COMPLICATIONS: None immediate. PROCEDURE: An ultrasound guided thoracentesis was thoroughly discussed with the patient and questions answered. The benefits, risks, alternatives and complications were also discussed. The patient understands and wishes to proceed with  the procedure. Written consent was obtained. Ultrasound was performed to localize and mark an adequate pocket of fluid in the right chest. The area was then prepped and draped in the normal sterile fashion. 1% Lidocaine was used for local anesthesia. Under ultrasound guidance a 6 Fr Safe-T-Centesis catheter was introduced. Thoracentesis was performed. The catheter was removed and a dressing applied. FINDINGS: A total of approximately 700 mL of straw-colored fluid was removed. Samples were sent to the laboratory as requested by the clinical team. IMPRESSION: Successful ultrasound guided therapeutic and diagnostic right-sided thoracentesis yielding 700 mL of pleural fluid. Performed by Anders Grant NP Electronically Signed   By: Marliss Coots M.D.   On: 01/19/2024 13:13   DG Chest 1 View Result Date: 01/19/2024 CLINICAL DATA:  Status post right thoracentesis, pleural effusion EXAM: CHEST  1 VIEW COMPARISON:  01/18/2024 FINDINGS: Single frontal view of the chest demonstrates stable enlargement the cardiac silhouette. Near complete resolution of the right pleural effusion seen previously. Patchy airspace disease within the right mid and upper lung zones unchanged. No pneumothorax. No acute bony abnormalities. IMPRESSION: 1. Near complete resolution of right pleural effusion after thoracentesis. No evidence of pneumothorax. 2. Persistent right mid and upper lung zone airspace disease. Electronically Signed   By: Sharlet Salina M.D.   On: 01/19/2024 12:00   DG Chest 2 View Result Date: 01/18/2024 CLINICAL DATA:  Viral pneumonia. EXAM: CHEST - 2 VIEW COMPARISON:  Chest radiograph dated 01/15/2024. FINDINGS: The heart size and mediastinal contours are within normal limits. There is moderate right airspace opacities and atelectasis. A right pleural effusion may contribute. The left lung is clear. No pneumothorax. IMPRESSION: Moderate right airspace opacities and atelectasis consistent with pneumonia. Electronically  Signed   By: Romona Curls M.D.   On: 01/18/2024 11:56   ECHOCARDIOGRAM COMPLETE Result Date: 01/17/2024    ECHOCARDIOGRAM REPORT   Patient Name:   CLESSIE KARRAS Date of Exam: 01/17/2024 Medical Rec #:  [id number]      Height:       64.0 in Accession #:    [id number]     Weight:       205.9 lb Date of Birth:  Mar 30, 1933      BSA:          1.981 m Patient Age:    89 years       BP:           107/71 mmHg Patient Gender: F              HR:           77 bpm. Exam Location:  Inpatient Procedure: 2D Echo, Cardiac Doppler and Color  Doppler (Both Spectral and Color            Flow Doppler were utilized during procedure). Indications:    CHF-acute diastolic  History:        Patient has prior history of Echocardiogram examinations, most                 recent 09/27/2022. COPD, Arrythmias:Atrial Fibrillation,                 Signs/Symptoms:Dyspnea; Risk Factors:Hypertension and                 Dyslipidemia.  Sonographer:    Vern Claude Referring Phys: [id number] ERIC CHEN IMPRESSIONS  1. Left ventricular ejection fraction, by estimation, is 55%. The left ventricle has normal function. Left ventricular endocardial border not optimally defined to evaluate regional wall motion. Left ventricular diastolic parameters are indeterminate.  2. Right ventricular systolic function is normal. The right ventricular size is not well visualized. There is mildly elevated pulmonary artery systolic pressure. The estimated right ventricular systolic pressure is 37.6 mmHg.  3. Left atrial size was severely dilated.  4. Right atrial size was mildly dilated.  5. The mitral valve is normal in structure. Trivial mitral valve regurgitation. No evidence of mitral stenosis. Moderate mitral annular calcification.  6. The aortic valve is tricuspid. There is mild calcification of the aortic valve. Aortic valve regurgitation is not visualized. No aortic stenosis is present.  7. Aortic dilatation noted. There is mild dilatation of the aortic root, measuring 39  mm. There is mild dilatation of the ascending aorta, measuring 39 mm.  8. The inferior vena cava is normal in size with greater than 50% respiratory variability, suggesting right atrial pressure of 3 mmHg.  9. The patient was in atrial fibrillation. FINDINGS  Left Ventricle: Left ventricular ejection fraction, by estimation, is 55%. The left ventricle has normal function. Left ventricular endocardial border not optimally defined to evaluate regional wall motion. The left ventricular internal cavity size was normal in size. There is no left ventricular hypertrophy. Left ventricular diastolic parameters are indeterminate. Right Ventricle: The right ventricular size is not well visualized. Right vetricular wall thickness was not well visualized. Right ventricular systolic function is normal. There is mildly elevated pulmonary artery systolic pressure. The tricuspid regurgitant velocity is 2.94 m/s, and with an assumed right atrial pressure of 3 mmHg, the estimated right ventricular systolic pressure is 37.6 mmHg. Left Atrium: Left atrial size was severely dilated. Right Atrium: Right atrial size was mildly dilated. Pericardium: There is no evidence of pericardial effusion. Mitral Valve: The mitral valve is normal in structure. Moderate mitral annular calcification. Trivial mitral valve regurgitation. No evidence of mitral valve stenosis. MV peak gradient, 3.9 mmHg. The mean mitral valve gradient is 2.0 mmHg. Tricuspid Valve: The tricuspid valve is normal in structure. Tricuspid valve regurgitation is mild. Aortic Valve: The aortic valve is tricuspid. There is mild calcification of the aortic valve. Aortic valve regurgitation is not visualized. No aortic stenosis is present. Aortic valve mean gradient measures 2.7 mmHg. Aortic valve peak gradient measures 4.1 mmHg. Aortic valve area, by VTI measures 2.13 cm. Pulmonic Valve: The pulmonic valve was normal in structure. Pulmonic valve regurgitation is not visualized.  Aorta: Aortic dilatation noted. There is mild dilatation of the aortic root, measuring 39 mm. There is mild dilatation of the ascending aorta, measuring 39 mm. Venous: The inferior vena cava is normal in size with greater than 50% respiratory variability, suggesting right atrial pressure of  3 mmHg. IAS/Shunts: No atrial level shunt detected by color flow Doppler.  LEFT VENTRICLE PLAX 2D LVIDd:         5.20 cm LVIDs:         3.60 cm LV PW:         0.80 cm LV IVS:        0.80 cm LVOT diam:     2.00 cm LV SV:         35 LV SV Index:   18 LVOT Area:     3.14 cm  LV Volumes (MOD) LV vol d, MOD A2C: 90.8 ml LV vol d, MOD A4C: 110.0 ml LV vol s, MOD A2C: 39.2 ml LV vol s, MOD A4C: 46.0 ml LV SV MOD A2C:     51.6 ml LV SV MOD A4C:     110.0 ml LV SV MOD BP:      60.8 ml RIGHT VENTRICLE             IVC RV Basal diam:  3.40 cm     IVC diam: 1.60 cm RV Mid diam:    2.00 cm RV S prime:     11.10 cm/s LEFT ATRIUM              Index        RIGHT ATRIUM           Index LA diam:        5.40 cm  2.73 cm/m   RA Area:     17.80 cm LA Vol (A2C):   114.0 ml 57.55 ml/m  RA Volume:   46.50 ml  23.48 ml/m LA Vol (A4C):   97.2 ml  49.07 ml/m LA Biplane Vol: 108.0 ml 54.53 ml/m  AORTIC VALVE                    PULMONIC VALVE AV Area (Vmax):    1.90 cm     PV Vmax:       0.90 m/s AV Area (Vmean):   1.87 cm     PV Peak grad:  3.2 mmHg AV Area (VTI):     2.13 cm AV Vmax:           101.10 cm/s AV Vmean:          72.133 cm/s AV VTI:            0.163 m AV Peak Grad:      4.1 mmHg AV Mean Grad:      2.7 mmHg LVOT Vmax:         61.25 cm/s LVOT Vmean:        42.850 cm/s LVOT VTI:          0.110 m LVOT/AV VTI ratio: 0.68  AORTA Ao Root diam: 3.90 cm Ao Asc diam:  3.90 cm MITRAL VALVE               TRICUSPID VALVE MV Area (PHT): 4.68 cm    TR Peak grad:   34.6 mmHg MV Area VTI:   3.83 cm    TR Vmax:        294.00 cm/s MV Peak grad:  3.9 mmHg MV Mean grad:  2.0 mmHg    SHUNTS MV Vmax:       0.98 m/s    Systemic VTI:  0.11 m MV Vmean:       57.2 cm/s   Systemic Diam: 2.00 cm MV Decel Time: 162 msec MV E velocity: 90.00  cm/s Dalton McleanMD Electronically signed by Wilfred Lacy Signature Date/Time: 01/17/2024/10:55:46 AM    Final      Assessment and Plan:   Persistent A fib RVR  - presented with SOB and chest discomfort, found to have pneumonia, A fib RVR - on PTA cardizem 240mg , labetalol 200mg  BID, and amiodarone 200mg  daily  - TSH WNL on 12/27/23  - Echo with LVEF 55%, not optimal for WM assessment, indeterminate diastolic, normal RV, PASP 37.6 mmHg, severe LAE, mild RAE, trivial MR, mod MAC, mild dilatation of the aortic root 39 mm. There is mild dilatation of the ascending aorta 39 mm. IVC normal.  - Likely etiology is acute illness + underlying lung disease + albuterol use; not symptomatic today  - s/p cardizem gtt and amiodarone re-load by hospitalist, rate control improved but increased HR with activity, currently RVR up to 120s at rest, currently on amiodarone 400mg  BID, cardizem 240mg  daily, and PRN IV metoprolol; will reduce PO amiodarone to 200mg  BID, continue cardizem to 240mg  daily, will plan for DCCV tomorrow, potential to add bisoprolol and stop PTA labetalol if needed going forward   - continue DOAC Eliquis 5mg  BID, she states has been compliant   Acute on chronic diastolic heart failure  - BNP 914 on 01/15/24  - CXR showed RUL pneumonia 01/15/24 - CT chest 01/16/24 showed small-moderate sized right-sided pleural effusion, increased. Dependent right basilar atelectasis and patchy airspace opacity within the more superior aspect of the right lower lobe.Additionally, there is interlobular septal thickening and patchy ground-glass attenuation throughout the right upper lobe. - Echo as above - clinical presentation likely multifactorial from pneumonia, CHF, A fib RVR, pleural effusion  - maybe tachycardia mediated  - on PTA Lasix 40mg  daily, now on PO 40mg  BID, UOP 1.3L over the past 24 hours, weight not recorded,  clinically remains hypervolemic, recommend IV Lasix 40mg  x1 now - GDMT: would switch labetalol to bisoprolol if no significant pause or bradycardia after DCCV,  intolerant to spironolactone in the past, may add SGLT2I Jardiance before discharge, if BP allows, would also try add Entresto   Pneumonia  Right pleural effusion s/p thoracentesis  HTN Obesity COPD - per primary team       Risk Assessment/Risk Scores:    New York Heart Association (NYHA) Functional Class NYHA Class II  CHA2DS2-VASc Score = 5  This indicates a 7.2% annual risk of stroke. The patient's score is based upon: CHF History: 1 HTN History: 1 Diabetes History: 0 Stroke History: 0 Vascular Disease History: 0 Age Score: 2 Gender Score: 1     For questions or updates, please contact [facility] Please consult [email or web address] for contact info under    Signed, Cyndi Bender, NP  01/20/2024 5:06 PM  Patient seen and examined.  Agree with above documentation.  Ms. Chiem is an 89 year old female with a history of paroxysmal atrial fibrillation, hypertension, CVA, diastolic heart failure, COPD who were consulted for evaluation of A-fib with RVR at the request of Dr. Tyson Babinski.  She follows with Dr. Wyline Mood, has history of A-fib.  During hospitalization 11/2022 for pneumonia/flu, found to have A-fib with RVR.  She was started on amiodarone and converted.  She was discharged on Eliquis, diltiazem, and labetalol.  She was back in A-fib at appointment in A-fib clinic 12/18/2022.  Cardioversion was offered but she declined at that time.  Follow-up 12/2022 she was in sinus rhythm.  Amiodarone was discontinued 08/2023 as she was again in sinus rhythm.  She saw  Dr. Lilia Pro 1 6/25 and noted to be in A-fib.  DCCV was arranged but canceled on 11/28/2023 as she had converted back to sinus rhythm.  At appointment 12/27/2023 she was back in A-fib.  She was reloaded with amiodarone.  She presented to the ED with shortness of breath on  01/15/2024.  Labs notable for troponin 11 > 11, BNP 202, lactate 0.8.  CT chest showed small to moderate right pleural effusion, patchy airspace opacity that could represent asymmetric edema versus pneumonia.  Procalcitonin was negative.  She underwent thoracentesis done 01/19/2024 with 700 cc removed.  She was treated with IV Lasix.  She was in A-fib with RVR on admission, started on Cardizem drip and amiodarone.  Currently on amiodarone 400 mg twice daily and Cardizem 240 mg daily.  Noted to have elevated heart rate with minimal activity.  Most recent vital signs show BP 118/83, pulse 104, SpO2 93% on 1 L.  Labs from this morning show creatinine 0.9.  On exam, patient is alert and oriented, tachycardic, regular, no murmurs, expiratory wheezing, no LE edema or JVD.  For her atrial fibrillation, she has had persistent A-fib during admission which has been difficult to rate control.  In addition suspect presentation with decompensated heart failure likely due to A-fib with RVR.  She has been loaded with amiodarone but remains in A-fib.  Recommend cardioversion to maintain sinus rhythm.  Discussed with patient and her daughter/son-in-law, and they are agreeable to proceeding tomorrow.  Denies any missed doses of Eliquis over the last 3 weeks.  Little Ishikawa, MD

## 2024-01-21 ENCOUNTER — Inpatient Hospital Stay (HOSPITAL_COMMUNITY): Admitting: Anesthesiology

## 2024-01-21 ENCOUNTER — Encounter (HOSPITAL_COMMUNITY)
Admission: EM | Disposition: A | Discharge status: Home / Self Care | Payer: Self-pay | Source: Home / Self Care | Attending: Internal Medicine

## 2024-01-21 ENCOUNTER — Encounter (HOSPITAL_COMMUNITY): Payer: Self-pay | Admitting: Cardiology

## 2024-01-21 DIAGNOSIS — I4891 Unspecified atrial fibrillation: Secondary | ICD-10-CM

## 2024-01-21 DIAGNOSIS — J189 Pneumonia, unspecified organism: Secondary | ICD-10-CM | POA: Diagnosis not present

## 2024-01-21 DIAGNOSIS — I509 Heart failure, unspecified: Secondary | ICD-10-CM

## 2024-01-21 DIAGNOSIS — J449 Chronic obstructive pulmonary disease, unspecified: Secondary | ICD-10-CM

## 2024-01-21 DIAGNOSIS — I5033 Acute on chronic diastolic (congestive) heart failure: Secondary | ICD-10-CM | POA: Diagnosis not present

## 2024-01-21 DIAGNOSIS — I110 Hypertensive heart disease with heart failure: Secondary | ICD-10-CM

## 2024-01-21 LAB — CBC
HCT: 36.3 % (ref 36.0–46.0)
Hemoglobin: 11.4 g/dL — ABNORMAL LOW (ref 12.0–15.0)
MCH: 27.9 pg (ref 26.0–34.0)
MCHC: 31.4 g/dL (ref 30.0–36.0)
MCV: 89 fL (ref 80.0–100.0)
Platelets: 236 10*3/uL (ref 150–400)
RBC: 4.08 MIL/uL (ref 3.87–5.11)
RDW: 14 % (ref 11.5–15.5)
WBC: 7.5 10*3/uL (ref 4.0–10.5)
nRBC: 0 % (ref 0.0–0.2)

## 2024-01-21 LAB — MAGNESIUM: Magnesium: 2 mg/dL (ref 1.7–2.4)

## 2024-01-21 LAB — CYTOLOGY - NON PAP

## 2024-01-21 LAB — BASIC METABOLIC PANEL
Anion gap: 7 (ref 5–15)
BUN: 15 mg/dL (ref 8–23)
CO2: 33 mmol/L — ABNORMAL HIGH (ref 22–32)
Calcium: 9.4 mg/dL (ref 8.9–10.3)
Chloride: 99 mmol/L (ref 98–111)
Creatinine, Ser: 1.04 mg/dL — ABNORMAL HIGH (ref 0.44–1.00)
GFR, Estimated: 52 mL/min — ABNORMAL LOW (ref >60)
Glucose, Bld: 104 mg/dL — ABNORMAL HIGH (ref 70–99)
Potassium: 4.5 mmol/L (ref 3.5–5.1)
Sodium: 139 mmol/L (ref 135–145)

## 2024-01-21 SURGERY — CARDIOVERSION (CATH LAB)
Anesthesia: General

## 2024-01-21 MED ORDER — DICLOFENAC SODIUM 1 % EX GEL: 2.0000 g | Freq: Four times a day (QID) | CUTANEOUS | Status: DC | PRN

## 2024-01-21 MED ORDER — FUROSEMIDE 40 MG PO TABS
40.0000 mg | ORAL_TABLET | Freq: Every day | ORAL | Status: DC
  Administered 2024-01-21 – 2024-01-22 (×2): 40 mg via ORAL
  Filled 2024-01-21 (×2): qty 1

## 2024-01-21 MED ORDER — LIDOCAINE 2% (20 MG/ML) 5 ML SYRINGE
INTRAMUSCULAR | Status: DC | PRN
  Administered 2024-01-21: 60 mg via INTRAVENOUS

## 2024-01-21 MED ORDER — PROPOFOL 10 MG/ML IV BOLUS
INTRAVENOUS | Status: DC | PRN
  Administered 2024-01-21: 50 mg via INTRAVENOUS

## 2024-01-21 SURGICAL SUPPLY — 1 items: PAD DEFIB RADIO PHYSIO CONN (PAD) ×1 IMPLANT

## 2024-01-21 NOTE — Anesthesia Preprocedure Evaluation (Signed)
 Anesthesia Evaluation  Patient identified by MRN, date of birth, ID band Patient awake    Reviewed: Allergy & Precautions, H&P , NPO status , Patient's Chart, lab work & pertinent test results  Airway Mallampati: II   Neck ROM: full    Dental   Pulmonary shortness of breath, COPD   breath sounds clear to auscultation       Cardiovascular hypertension, +CHF  + dysrhythmias Atrial Fibrillation  Rhythm:irregular Rate:Normal     Neuro/Psych TIACVA    GI/Hepatic ,GERD  ,,  Endo/Other    Renal/GU      Musculoskeletal  (+) Arthritis ,    Abdominal   Peds  Hematology   Anesthesia Other Findings   Reproductive/Obstetrics                             Anesthesia Physical Anesthesia Plan  ASA: 3  Anesthesia Plan: General   Post-op Pain Management:    Induction: Intravenous  PONV Risk Score and Plan: 3 and Propofol infusion and Treatment may vary due to age or medical condition  Airway Management Planned: Nasal Cannula  Additional Equipment:   Intra-op Plan:   Post-operative Plan:   Informed Consent: I have reviewed the patients History and Physical, chart, labs and discussed the procedure including the risks, benefits and alternatives for the proposed anesthesia with the patient or authorized representative who has indicated his/her understanding and acceptance.     Dental advisory given  Plan Discussed with: CRNA, Anesthesiologist and Surgeon  Anesthesia Plan Comments:        Anesthesia Quick Evaluation

## 2024-01-21 NOTE — Progress Notes (Signed)
 PT Cancellation Note  Patient Details Name: Marjon Doxtater MRN: [id number] DOB: 09-29-1933   Cancelled Treatment:    Reason Eval/Treat Not Completed: (P) Patient at procedure or test/unavailable Pt off floor for cardioversion. PT will follow back this afternoon for Evaluation.  Skylie Hiott B. Beverely Risen PT, DPT Acute Rehabilitation Services Please use secure chat or  Call Office [phone]    Elon Alas [facility] 01/21/2024, 9:33 AM

## 2024-01-21 NOTE — Plan of Care (Signed)

## 2024-01-21 NOTE — Anesthesia Postprocedure Evaluation (Signed)
 Anesthesia Post Note  Patient: [name]  Procedure(s) Performed: CARDIOVERSION     Patient location during evaluation: Cath Lab Anesthesia Type: General Level of consciousness: awake and alert Pain management: pain level controlled Vital Signs Assessment: post-procedure vital signs reviewed and stable Respiratory status: spontaneous breathing, nonlabored ventilation, respiratory function stable and patient connected to nasal cannula oxygen Cardiovascular status: blood pressure returned to baseline and stable Postop Assessment: no apparent nausea or vomiting Anesthetic complications: no   There were no known notable events for this encounter.  Last Vitals:  Vitals:   01/21/24 1150 01/21/24 1155  BP: (!) 141/75 (!) 152/79  Pulse: 70 68  Resp: (!) 21 (!) 23  Temp:    SpO2: 94% 95%    Last Pain:  Vitals:   01/21/24 1324  TempSrc:   PainSc: 7                  Caylan Chenard S

## 2024-01-21 NOTE — Progress Notes (Addendum)
 Rounding Note    [name] Name: [name] Date of Encounter: 01/21/2024  [facility] Cardiologist: Maisie Fus, MD   Subjective   Denies any CP. Breathing improved. Slept ok last night.   Inpatient Medications    Scheduled Meds:  amiodarone  200 mg Oral BID   apixaban  5 mg Oral BID   diltiazem  240 mg Oral Daily   furosemide  40 mg Oral Daily   gabapentin  300 mg Oral Daily   And   gabapentin  600 mg Oral Daily   methocarbamol  750 mg Oral Daily   And   methocarbamol  1,500 mg Oral Daily   mirtazapine  15 mg Oral QHS   potassium chloride  40 mEq Oral Daily   pravastatin  20 mg Oral QHS   sodium chloride flush  3-10 mL Intravenous Q12H   Continuous Infusions:  PRN Meds: acetaminophen **OR** acetaminophen, alum & mag hydroxide-simeth, ipratropium-albuterol, levalbuterol, melatonin, metoprolol tartrate, ondansetron **OR** ondansetron (ZOFRAN) IV, mouth rinse, sodium chloride flush   Vital Signs    Vitals:   01/19/24 2340 01/20/24 0321 01/20/24 1300 01/21/24 0423  BP: (!) 130/97 (!) 131/92 118/83 (!) 128/97  Pulse: (!) 105 98  95  Resp: (!) 24 18 18 18   Temp: 97.9 F (36.6 C) 98 F (36.7 C) 98.4 F (36.9 C) 98 F (36.7 C)  TempSrc: Oral Axillary Oral Oral  SpO2: 93% 93%  94%  Weight:      Height:        Intake/Output Summary (Last 24 hours) at 01/21/2024 0750 Last data filed at 01/21/2024 0427 Gross per 24 hour  Intake 3 ml  Output 1200 ml  Net -1197 ml      01/15/2024    4:54 PM 12/27/2023    9:24 AM 11/25/2023    2:56 PM  Last 3 Weights  Weight (lbs) 205 lb 14.6 oz 206 lb 202 lb 9.6 oz  Weight (kg) 93.4 kg 93.441 kg 91.899 kg      Telemetry    Atrial fibrillation with HR high 90s to 120s - Personally Reviewed  ECG    Atrial fibrillation with HR 90s - Personally Reviewed  Physical Exam   GEN: No acute distress.   Neck: No JVD Cardiac: irregular, no murmurs, rubs, or gallops.  Respiratory: Clear to auscultation  bilaterally. GI: Soft, nontender, non-distended  MS: No edema; No deformity. Neuro:  Nonfocal  Psych: Normal affect   Labs    High Sensitivity Troponin:   Recent Labs  Lab 01/15/24 1719 01/15/24 1919  TROPONINIHS 11 11     Chemistry Recent Labs  Lab 01/17/24 0504 01/18/24 0441 01/20/24 0517  NA 140 140 140  K 3.5 4.9 3.9  CL 100 107 97*  CO2 29 28 31   GLUCOSE 122* 118* 109*  BUN 10 13 11   CREATININE 0.91 0.77 0.91  CALCIUM 9.2 9.1 9.0  MG 2.0 2.1 2.0  PROT 5.8* 5.9* 5.5*  ALBUMIN 3.4* 3.3* 3.1*  AST 16 13* 18  ALT 20 20 20   ALKPHOS 60 57 60  BILITOT 0.5 0.3 0.6  GFRNONAA >60 >60 >60  ANIONGAP 11 5 12     Lipids No results for input(s): "CHOL", "TRIG", "HDL", "LABVLDL", "LDLCALC", "CHOLHDL" in Katelyn last 168 hours.  Hematology Recent Labs  Lab 01/18/24 0441 01/20/24 0517 01/21/24 0630  WBC 9.2 8.4 7.5  RBC 3.93 3.99 4.08  HGB 11.0* 11.2* 11.4*  HCT 35.2* 35.4* 36.3  MCV 89.6 88.7  89.0  MCH 28.0 28.1 27.9  MCHC 31.3 31.6 31.4  RDW 14.4 14.1 14.0  PLT 226 224 236   Thyroid No results for input(s): "TSH", "FREET4" in Katelyn last 168 hours.  BNP Recent Labs  Lab 01/15/24 1720  BNP 202.5*    DDimer No results for input(s): "DDIMER" in Katelyn last 168 hours.   Radiology    US THORACENTESIS ASP PLEURAL SPACE W/IMG GUIDE Result Date: 01/19/2024 INDICATION: 89 year old female. History of atrial fibrillation. Presented to ED with chest pain. Found to have a right-sided pleural effusion. Request is for therapeutic and diagnostic thoracentesis EXAM: ULTRASOUND GUIDED THERAPEUTIC AND DIAGNOSTIC RIGHT-SIDED THORACENTESIS MEDICATIONS: Lidocaine 1% 10 mL COMPLICATIONS: None immediate. PROCEDURE: An ultrasound guided thoracentesis was thoroughly discussed with [name] and questions answered. Katelyn benefits, risks, alternatives and complications were also discussed. [name] understands and wishes to proceed with Katelyn procedure. Written consent was obtained. Ultrasound was  performed to localize and mark an adequate pocket of fluid in Katelyn right chest. Katelyn area was then prepped and draped in Katelyn normal sterile fashion. 1% Lidocaine was used for local anesthesia. Under ultrasound guidance a 6 Fr Safe-T-Centesis catheter was introduced. Thoracentesis was performed. Katelyn catheter was removed and a dressing applied. FINDINGS: A total of approximately 700 mL of straw-colored fluid was removed. Samples were sent to Katelyn laboratory as requested by Katelyn clinical team. IMPRESSION: Successful ultrasound guided therapeutic and diagnostic right-sided thoracentesis yielding 700 mL of pleural fluid. Performed by Anders Grant NP Electronically Signed   By: Marliss Coots M.D.   On: 01/19/2024 13:13   DG Chest 1 View Result Date: 01/19/2024 CLINICAL DATA:  Status post right thoracentesis, pleural effusion EXAM: CHEST  1 VIEW COMPARISON:  01/18/2024 FINDINGS: Single frontal view of Katelyn chest demonstrates stable enlargement Katelyn cardiac silhouette. Near complete resolution of Katelyn right pleural effusion seen previously. Patchy airspace disease within Katelyn right mid and upper lung zones unchanged. No pneumothorax. No acute bony abnormalities. IMPRESSION: 1. Near complete resolution of right pleural effusion after thoracentesis. No evidence of pneumothorax. 2. Persistent right mid and upper lung zone airspace disease. Electronically Signed   By: Sharlet Salina M.D.   On: 01/19/2024 12:00    Cardiac Studies   Echo 01/17/2024  1. Left ventricular ejection fraction, by estimation, is 55%. Katelyn left  ventricle has normal function. Left ventricular endocardial border not  optimally defined to evaluate regional wall motion. Left ventricular  diastolic parameters are indeterminate.   2. Right ventricular systolic function is normal. Katelyn right ventricular  size is not well visualized. There is mildly elevated pulmonary artery  systolic pressure. Katelyn estimated right ventricular systolic pressure is   37.6 mmHg.   3. Left atrial size was severely dilated.   4. Right atrial size was mildly dilated.   5. Katelyn mitral valve is normal in structure. Trivial mitral valve  regurgitation. No evidence of mitral stenosis. Moderate mitral annular  calcification.   6. Katelyn aortic valve is tricuspid. There is mild calcification of Katelyn  aortic valve. Aortic valve regurgitation is not visualized. No aortic  stenosis is present.   7. Aortic dilatation noted. There is mild dilatation of Katelyn aortic root,  measuring 39 mm. There is mild dilatation of Katelyn ascending aorta,  measuring 39 mm.   8. Katelyn inferior vena cava is normal in size with greater than 50%  respiratory variability, suggesting right atrial pressure of 3 mmHg.   9. [name] was in atrial fibrillation.   [name]  Profile     89 y.o. female with PMH of HTN, HLD, GERD, prior TIA/CVA, PAF, HFpEF, COPD and obesity presented with CP and SOB. CXR suggest either pleural effusion vs PNA. She was being treated with CAP and CHF, underwent thoracentesis 3/2. She was in afib RVR on admission, was placed on cardizem gtt and amiodarone load.   Assessment & Plan    Atrial fibrillation with RVR  - on labetalol, cardizem, amiodarone and Eliquis at home  - previously set up for outpatient DCCV on 11/28/23, procedure cancelled as she was in sinus rhythm upon arrival. However she was back in afib when seen on follow up with Dr. Carolan Clines  - amiodarone started as outpatient on 12/27/2023  - compliant with eliquis with no missed doses prior to admission. Plan for DCCV today. Risk and benefit of Katelyn procedure including 1:10000 chance of severe hypotension, bradycardia, aspiration event and acute respiratory failure explained to [name] who displayed clear understanding and agree to proceed.   Acute respiratory failure: CAP vs acute on chronic diastolic CHF  - Echo 01/17/2024 EF 55%, RVSP 37.68mmHg, severe LAE, dilated aortic root 39 mm  - thoracentesis on 3/2  with removal of 700 cc of straw colored fluid  - felt volume overload may have been contributed by afib (initially started on lasix during office visit on 2/7 when [name] was noted to be back in afib)  - currently back on oral lasix.   COPD Hypertension      For questions or updates, please contact [facility] Please consult [email or web address] for contact info under        Signed, Azalee Course, PA  01/21/2024, 7:50 AM    [name] seen and examined.  Agree with above documentation.  On exam, [name] is alert and oriented, regular rate and rhythm, no murmurs, lungs CTAB, no LE edema or JVD.  Underwent cardioversion this morning, now in NSR in 60s. Will continue PO amiodarone and diltiazem.  Continue Eliquis for anticoagulation.  Appears euvolemic, has been transitioned to PO lasix.  Little Ishikawa, MD

## 2024-01-21 NOTE — Transfer of Care (Signed)
 Immediate Anesthesia Transfer of Care Note  Patient: [name]  Procedure(s) Performed: CARDIOVERSION  Patient Location: Cath Lab  Anesthesia Type:MAC  Level of Consciousness: awake, alert , and oriented  Airway & Oxygen Therapy: Patient Spontanous Breathing and Patient connected to nasal cannula oxygen  Post-op Assessment: Report given to RN and Post -op Vital signs reviewed and stable  Post vital signs: Reviewed and stable  Last Vitals:  Vitals Value Taken Time  BP 167/99 01/21/24 1124  Temp    Pulse 114 01/21/24 1124  Resp 28 01/21/24 1124  SpO2 90 % 01/21/24 1124  Vitals shown include unfiled device data.  Last Pain:  Vitals:   01/21/24 0950  TempSrc: Temporal  PainSc:       Patients Stated Pain Goal: 0 (01/17/24 2200)  Complications: No notable events documented.

## 2024-01-21 NOTE — Progress Notes (Addendum)
 PROGRESS NOTE  [name] ZOX:[id number] DOB: [birth year] DOA: 01/15/2024 PCP: Hadley Pen, MD   LOS: 6 days   Brief narrative:   [name] is a 89 y.o. female with past medical history of hypertension, prior stroke, hyperlipidemia, GERD presented to hospital with shortness of breath and chest pain for 3 days.  Patient was noted to be febrile.  Initial labs showed WBC 18.5.  Respiratory viral panel was negative for infection.  Chest x-ray showed right upper lobe opacity concerning for infection.  Patient was started on ceftriaxone and azithromycin and was admitted further workup.  During hospitalization, patient also had atrial fibrillation with RVR and cardiology was consulted.   Assessment/Plan: Principal Problem:   CAP (community acquired pneumonia) Active Problems:   Acute on chronic diastolic CHF (congestive heart failure) (HCC)   Pleural effusion on right - s/p 700 ml thoracentesis 01-19-2024   Atrial fibrillation with RVR (HCC)   COPD (chronic obstructive pulmonary disease) (HCC)   Essential hypertension   Obesity, Class II, BMI 35-39.9   Chronic diastolic CHF (congestive heart failure) (HCC)   * CAP (community acquired pneumonia) CT scan of the chest was also performed that showed small to moderate right pleural effusion..  Patient initially received Rocephin Zithromax DuoNebs.  Pro-Cal was less than 0.10.  Flu COVID RSV was negative.  Antibiotics have been stopped at this time.  Possible viral pneumonia.  No fever.  Continue DuoNebs.  Currently on room air.  Pleural effusion on right - s/p 700 ml thoracentesis 01-19-2024.  Improved breathing.  On room air . Acute on chronic diastolic CHF (congestive heart failure) likely from A-fib with RVR. Initially received IV Lasix, currently on oral Lasix twice daily.  Continue intake and output charting, daily weights.   Atrial fibrillation with RVR  Initially received Cardizem drip.  Patient received amiodarone load in the  hospital and Cardizem CD has been increased to 300 mg daily.  Currently on amiodarone 400 milligram twice daily.  Continue Eliquis.  Minimal activity causes A-fib with RVR.  Cardiology has been consulted and plan for DC cardioversion.  Chronic diastolic CHF (congestive heart failure) (HCC) Recent 2D echocardiogram from 09-2022 with LVEF 60%.  2D echocardiogram on 01-17-2024 shows LVEF 55% given COPD unable to use large doses of beta-blocker.  Cardiology following for DC cardioversion today.   Obesity, Class II, BMI 35-39.9 Body mass index is 35.34 kg/m.  Would benefit from weight loss as outpatient.   Essential hypertension On Cardizem CD and Lasix.  COPD (chronic obstructive pulmonary disease) (HCC) Continue nebulizers.  No need for steroids.  Debility weakness.  Patient does have a walker for ambulation at baseline.  Will get PT evaluation.  DVT prophylaxis: SCDs Start: 01/15/24 2210 apixaban (ELIQUIS) tablet 5 mg   Disposition: Home when okay with cardiology likely in 1 to 2 days  Status is: Inpatient Remains inpatient appropriate because: Pending clinical improvement, plan for DC cardioversion    Code Status:     Code Status: Full Code  Family Communication: Spoke with the patient's daughter at bedside on 01/21/2024.Marland Kitchen  Consultants: Cardiology  Procedures: Plan for DC cardioversion  Anti-infectives:  None    Subjective: Today, patient was seen and examined at bedside.  Patient states that she slept okay and breathing is okay.  Waiting for DC cardioversion today.  Family at bedside.  No nausea vomiting fever chills or rigor.  Objective: Vitals:   01/21/24 0423 01/21/24 0950  BP: (!) 128/97 (!) 168/113  Pulse: 95 Marland Kitchen)  128  Resp: 18 (!) 28  Temp: 98 F (36.7 C) 97.8 F (36.6 C)  SpO2: 94% 95%    Intake/Output Summary (Last 24 hours) at 01/21/2024 1126 Last data filed at 01/21/2024 0427 Gross per 24 hour  Intake 3 ml  Output 1200 ml  Net -1197 ml   Filed  Weights   01/15/24 1654  Weight: 93.4 kg   Body mass index is 35.34 kg/m.   Physical Exam: GENERAL: Patient is alert awake and Communicative. Not in obvious distress.  Elderly female, appears chronically ill, on nasal cannula oxygen, obese built. HENT: No scleral pallor or icterus. Pupils equally reactive to light. Oral mucosa is moist NECK: is supple, no gross swelling noted. CHEST: Decreased breath sounds bilaterally. CVS: S1 and S2 heard, irregularly irregular rhythm with tachycardia. ABDOMEN: Soft, non-tender, bowel sounds are present. EXTREMITIES: Bilateral lower extremity trace edema. CNS: Cranial nerves are intact. No focal motor deficits. SKIN: warm and dry without rashes.  Data Review: I have personally reviewed the following laboratory data and studies,  CBC: Recent Labs  Lab 01/15/24 1719 01/17/24 0504 01/18/24 0441 01/20/24 0517 01/21/24 0630  WBC 10.5 9.2 9.2 8.4 7.5  NEUTROABS  --  6.6 6.4 5.4  --   HGB 12.5 11.5* 11.0* 11.2* 11.4*  HCT 39.3 35.6* 35.2* 35.4* 36.3  MCV 88.7 88.3 89.6 88.7 89.0  PLT 278 230 226 224 236   Basic Metabolic Panel: Recent Labs  Lab 01/15/24 1719 01/17/24 0504 01/18/24 0441 01/20/24 0517 01/21/24 0630  NA 140 140 140 140 139  K 3.8 3.5 4.9 3.9 4.5  CL 102 100 107 97* 99  CO2 28 29 28 31  33*  GLUCOSE 103* 122* 118* 109* 104*  BUN 14 10 13 11 15   CREATININE 0.95 0.91 0.77 0.91 1.04*  CALCIUM 10.1 9.2 9.1 9.0 9.4  MG  --  2.0 2.1 2.0 2.0   Liver Function Tests: Recent Labs  Lab 01/17/24 0504 01/18/24 0441 01/20/24 0517  AST 16 13* 18  ALT 20 20 20   ALKPHOS 60 57 60  BILITOT 0.5 0.3 0.6  PROT 5.8* 5.9* 5.5*  ALBUMIN 3.4* 3.3* 3.1*   No results for input(s): "LIPASE", "AMYLASE" in the last 168 hours. No results for input(s): "AMMONIA" in the last 168 hours. Cardiac Enzymes: No results for input(s): "CKTOTAL", "CKMB", "CKMBINDEX", "TROPONINI" in the last 168 hours. BNP (last 3 results) Recent Labs     12/27/23 1001 01/15/24 1720  BNP 293.3* 202.5*    ProBNP (last 3 results) No results for input(s): "PROBNP" in the last 8760 hours.  CBG: No results for input(s): "GLUCAP" in the last 168 hours. Recent Results (from the past 240 hours)  Resp panel by RT-PCR (RSV, Flu A&B, Covid) Anterior Nasal Swab     Status: None   Collection Time: 01/15/24  8:12 PM   Specimen: Anterior Nasal Swab  Result Value Ref Range Status   SARS Coronavirus 2 by RT PCR NEGATIVE NEGATIVE Final   Influenza A by PCR NEGATIVE NEGATIVE Final   Influenza B by PCR NEGATIVE NEGATIVE Final    Comment: (NOTE) The Xpert Xpress SARS-CoV-2/FLU/RSV plus assay is intended as an aid in the diagnosis of influenza from Nasopharyngeal swab specimens and should not be used as a sole basis for treatment. Nasal washings and aspirates are unacceptable for Xpert Xpress SARS-CoV-2/FLU/RSV testing.  Fact Sheet for Patients: BloggerCourse.com  Fact Sheet for Healthcare Providers: SeriousBroker.it  This test is not yet approved or cleared by the  [place] [employer] and has been authorized for detection and/or diagnosis of SARS-CoV-2 by FDA under an TEFL teacher (EUA). This EUA will remain in effect (meaning this test can be used) for the duration of the COVID-19 declaration under Section 564(b)(1) of the Act, 21 U.S.C. section 360bbb-3(b)(1), unless the authorization is terminated or revoked.     Resp Syncytial Virus by PCR NEGATIVE NEGATIVE Final    Comment: (NOTE) Fact Sheet for Patients: BloggerCourse.com  Fact Sheet for Healthcare Providers: SeriousBroker.it  This test is not yet approved or cleared by the [place] FDA and has been authorized for detection and/or diagnosis of SARS-CoV-2 by FDA under an Emergency Use Authorization (EUA). This EUA will remain in effect (meaning this test can be used)  for the duration of the COVID-19 declaration under Section 564(b)(1) of the Act, 21 U.S.C. section 360bbb-3(b)(1), unless the authorization is terminated or revoked.  Performed at [facility], [address] [place], [place] [address]   Blood culture (routine x 2)     Status: None   Collection Time: 01/15/24  8:40 PM   Specimen: BLOOD  Result Value Ref Range Status   Specimen Description BLOOD LEFT ANTECUBITAL  Final   Special Requests   Final    BOTTLES DRAWN AEROBIC AND ANAEROBIC Blood Culture results may not be optimal due to an inadequate volume of blood received in culture bottles   Culture   Final    NO GROWTH 5 DAYS Performed at [facility], [address], [place], [place] [address]    Report Status 01/20/2024 FINAL  Final  Blood culture (routine x 2)     Status: None   Collection Time: 01/15/24  8:40 PM   Specimen: BLOOD RIGHT HAND  Result Value Ref Range Status   Specimen Description BLOOD RIGHT HAND  Final   Special Requests   Final    BOTTLES DRAWN AEROBIC AND ANAEROBIC Blood Culture results may not be optimal due to an inadequate volume of blood received in culture bottles   Culture   Final    NO GROWTH 5 DAYS Performed at [facility], [address] [place], [place] [address]    Report Status 01/20/2024 FINAL  Final     Studies: US THORACENTESIS ASP PLEURAL SPACE W/IMG GUIDE Result Date: 01/19/2024 INDICATION: 89 year old female. History of atrial fibrillation. Presented to ED with chest pain. Found to have a right-sided pleural effusion. Request is for therapeutic and diagnostic thoracentesis EXAM: ULTRASOUND GUIDED THERAPEUTIC AND DIAGNOSTIC RIGHT-SIDED THORACENTESIS MEDICATIONS: Lidocaine 1% 10 mL COMPLICATIONS: None immediate. PROCEDURE: An ultrasound guided thoracentesis was thoroughly discussed with the patient and questions answered. The benefits, risks, alternatives and complications were also discussed. The patient understands and  wishes to proceed with the procedure. Written consent was obtained. Ultrasound was performed to localize and mark an adequate pocket of fluid in the right chest. The area was then prepped and draped in the normal sterile fashion. 1% Lidocaine was used for local anesthesia. Under ultrasound guidance a 6 Fr Safe-T-Centesis catheter was introduced. Thoracentesis was performed. The catheter was removed and a dressing applied. FINDINGS: A total of approximately 700 mL of straw-colored fluid was removed. Samples were sent to the laboratory as requested by the clinical team. IMPRESSION: Successful ultrasound guided therapeutic and diagnostic right-sided thoracentesis yielding 700 mL of pleural fluid. Performed by Anders Grant NP Electronically Signed   By: Marliss Coots M.D.   On: 01/19/2024 13:13  Joycelyn Das, MD  [facility] 01/21/2024  If 7PM-7AM, please contact night-coverage

## 2024-01-21 NOTE — Evaluation (Signed)
 Physical Therapy Evaluation Patient Details Name: [name] MRN: [id number] DOB: [birth year] Today's Date: 01/21/2024  History of Present Illness  89 y.o. female who presents  01/15/2024 with worsening shortness of breath, fatigue, peripheral edema, and cough. Chest x-ray demonstrated right upper lobe opacity concerning for infection. Admitted for treatment of CAP. 3/2 Thoracentesis 3/3 s/p cardioversion WUJ:[id number], prior TIA/stroke, hyperlipidemia, GERD, paroxysmal afib, diastolic heart failure, COPD, obesity,  Clinical Impression  PTA pt living with daughter and son in-law in multistory home (pt's room and bathroom on 1st level) with 1 step to enter. Pt was ambulating household distances with RW and needing wheelchair for limited community ambulation. Pt does her own dressing, daughter assists with bathing, medication management and iADLs. Pt is currently limited in safe mobility by R knee OA pain, in presence of generalized weakness, and associated decreased balance and endurance. Pt requires min A for STS and short distance ambulation with RW. Pt will benefit from HHPT at discharge to work on above stated deficits. PT will continue to follow acutely.        If plan is discharge home, recommend the following: A little help with walking and/or transfers;A little help with bathing/dressing/bathroom;Assistance with cooking/housework;Direct supervision/assist for medications management;Direct supervision/assist for financial management;Assist for transportation;Help with stairs or ramp for entrance   Can travel by private vehicle    Yes    Equipment Recommendations None recommended by PT (has necessary equipment)     Functional Status Assessment Patient has had a recent decline in their functional status and demonstrates the ability to make significant improvements in function in a reasonable and predictable amount of time.     Precautions / Restrictions Precautions Precautions:  Fall Restrictions Weight Bearing Restrictions Per Provider Order: No      Mobility  Bed Mobility    Sitting up in recliner                 Transfers Overall transfer level: Needs assistance   Transfers: Sit to/from Stand Sit to Stand: Min assist           General transfer comment: min A for power up to standing increased weightshift to L LE due to R knee pain    Ambulation/Gait Ambulation/Gait assistance: Min assist Gait Distance (Feet): 20 Feet Assistive device: Rolling walker (2 wheels) Gait Pattern/deviations: Step-through pattern, Decreased stance time - right, Decreased weight shift to right Gait velocity: slowed Gait velocity interpretation: <1.31 ft/sec, indicative of household ambulator   General Gait Details: min A for steadying with ambulation to door and back, vc for proximity to RW, upright posture      Balance Overall balance assessment: Needs assistance Sitting-balance support: Feet supported, No upper extremity supported Sitting balance-Leahy Scale: Good     Standing balance support: Bilateral upper extremity supported, During functional activity, Reliant on assistive device for balance Standing balance-Leahy Scale: Poor Standing balance comment: requires UE support to maintain balance                             Pertinent Vitals/Pain Pain Assessment Pain Assessment: 0-10 Pain Score: 6  Pain Location: R knee with ambulation Pain Descriptors / Indicators: Grimacing, Guarding, Sore Pain Intervention(s): Limited activity within patient's tolerance, Monitored during session, Repositioned    Home Living Family/patient expects to be discharged to:: Private residence Living Arrangements: Children (daughter and son in-law) Available Help at Discharge: Family;Available 24 hours/day Type of Home: House Home Access: Stairs to enter  Entrance Stairs-Number of Steps: 1   Home Layout: Multi-level;Able to live on main level with  bedroom/bathroom (does not go up stairs) Home Equipment: Shower seat;BSC/3in1;Grab bars - tub/shower;Hand held shower head;Rollator (4 wheels)      Prior Function Prior Level of Function : Independent/Modified Independent             Mobility Comments: uses RW for household distances wheelchair ADLs Comments: independent with dressing, daughter assists with bathing and medication mangement     Extremity/Trunk Assessment   Upper Extremity Assessment Upper Extremity Assessment: Defer to OT evaluation    Lower Extremity Assessment Lower Extremity Assessment: RLE deficits/detail RLE Deficits / Details: R knee lacking full extention, and flexion RLE Sensation: WNL RLE Coordination: decreased fine motor    Cervical / Trunk Assessment Cervical / Trunk Assessment: Kyphotic  Communication   Communication Communication: Impaired Factors Affecting Communication: Hearing impaired    Cognition Arousal: Alert Behavior During Therapy: WFL for tasks assessed/performed   PT - Cognitive impairments: No apparent impairments                         Following commands: Intact       Cueing Cueing Techniques: Verbal cues, Gestural cues, Tactile cues     General Comments General comments (skin integrity, edema, etc.): HR noted during session 64 to 72 bpm, BP at rest 120/92, after ambulation 130/74        Assessment/Plan    PT Assessment Patient needs continued PT services  PT Problem List Decreased strength;Decreased range of motion;Decreased activity tolerance;Decreased balance;Decreased mobility;Decreased coordination;Pain       PT Treatment Interventions DME instruction;Gait training;Functional mobility training;Stair training;Therapeutic activities;Therapeutic exercise;Balance training;Cognitive remediation;Patient/family education    PT Goals (Current goals can be found in the Care Plan section)  Acute Rehab PT Goals PT Goal Formulation: With  patient/family Time For Goal Achievement: 02/04/24 Potential to Achieve Goals: Fair    Frequency Min 2X/week        AM-PAC PT "6 Clicks" Mobility  Outcome Measure Help needed turning from your back to your side while in a flat bed without using bedrails?: A Little Help needed moving from lying on your back to sitting on the side of a flat bed without using bedrails?: A Little Help needed moving to and from a bed to a chair (including a wheelchair)?: A Little Help needed standing up from a chair using your arms (e.g., wheelchair or bedside chair)?: A Little Help needed to walk in hospital room?: A Little Help needed climbing 3-5 steps with a railing? : A Lot 6 Click Score: 17    End of Session Equipment Utilized During Treatment: Gait belt Activity Tolerance: Patient tolerated treatment well;Patient limited by pain Patient left: in chair;with call bell/phone within reach;with family/visitor present Nurse Communication: Mobility status;Other (comment) (Voltaren for R knee) PT Visit Diagnosis: Unsteadiness on feet (R26.81);Other abnormalities of gait and mobility (R26.89);Muscle weakness (generalized) (M62.81);Difficulty in walking, not elsewhere classified (R26.2);Pain Pain - Right/Left: Right Pain - part of body: Knee    Time: 1440-1503 PT Time Calculation (min) (ACUTE ONLY): 23 min   Charges:   PT Evaluation $PT Eval Moderate Complexity: 1 Mod PT Treatments $Therapeutic Activity: 8-22 mins PT General Charges $$ ACUTE PT VISIT: 1 Visit         Venda Dice B. Beverely Risen PT, DPT Acute Rehabilitation Services Please use secure chat or  Call Office [phone]   [facility] 01/21/2024, 3:18 PM

## 2024-01-21 NOTE — CV Procedure (Addendum)
   DIRECT CURRENT CARDIOVERSION  NAMENyari Olsson    MRN: [id number] DOB:  [birth year]    ADMIT DATE: 01/15/2024  Indication:  Symptomatic atrial fibrillation w/ rapid ventricular rate.   Procedure Note:  The patient signed informed consent.  They have had had therapeutic anticoagulation with Eliquis greater than 3 weeks.  Anesthesia was administered by Dr. Creig Hines.  Adequate airway was maintained throughout and vital followed per protocol.  They were cardioverted x 1 with 200J of biphasic synchronized energy.  They converted to NSR.  There were no apparent complications.  The patient had normal neuro status and respiratory status post procedure with vitals stable as recorded elsewhere.    Follow up: To be determined at time of discharge. Patient will be transported back to her room. I spoke to her daughter [name] as per the patient's request. Will update the rounding attending as well.   Tessa Lerner, DO, [facility]  [address] [place], [place] [address] [phone] 12:28 PM

## 2024-01-21 NOTE — Interval H&P Note (Signed)
 History and Physical Interval Note:  01/21/2024 10:36 AM  [name]  has presented today for surgery, with the diagnosis of afib.  The various methods of treatment have been discussed with the patient and family. After consideration of risks, benefits and other options for treatment, the patient has consented to  Procedure(s): CARDIOVERSION (N/A) as a surgical intervention.  The patient's history has been reviewed, patient examined, no change in status, stable for surgery.  I have reviewed the patient's chart and labs.  Questions were answered to the patient's satisfaction.    Patient endorses no skipped dose of anticoagulation over the last 4 weeks.   Informed Consent   Shared Decision Making/Informed Consent The risks (stroke, cardiac arrhythmias rarely resulting in the need for a temporary or permanent pacemaker, skin irritation or burns and complications associated with conscious sedation including aspiration, arrhythmia, respiratory failure and death), benefits (restoration of normal sinus rhythm) and alternatives of a direct current cardioversion were explained in detail to Ms. Bajaj and she agrees to proceed.       Tessa Lerner, DO, [facility]  [facility]  [address] [place], [place] [address] 10:36 AM 01/21/24

## 2024-01-21 NOTE — TOC Progression Note (Signed)
 Transition of Care [facility]) - Progression Note    Patient Details  Name: [name] MRN: [id number] Date of Birth: December 03, 1932  Transition of Care [facility] CM/SW Contact  Graves-Bigelow, Lamar Laundry, RN Phone Number: 01/21/2024, 12:55 PM  Clinical Narrative: Patient discussed in progression rounds- plan for cardioversion today. PT/OT to consult. Case Manager continuing to follow for transition of care needs.     Expected Discharge Plan: Home/Self Care Barriers to Discharge: Continued Medical Work up  Expected Discharge Plan and Services In-house Referral: NA Discharge Planning Services: CM Consult   Living arrangements for the past 2 months: Single Family Home  Social Determinants of Health (SDOH) Interventions SDOH Screenings   Food Insecurity: No Food Insecurity (01/16/2024)  Housing: Low Risk  (01/16/2024)  Transportation Needs: No Transportation Needs (01/16/2024)  Utilities: Not At Risk (01/16/2024)  Social Connections: Socially Isolated (01/16/2024)  Tobacco Use: Low Risk  (01/15/2024)   Readmission Risk Interventions     No data to display

## 2024-01-22 ENCOUNTER — Inpatient Hospital Stay (HOSPITAL_COMMUNITY)

## 2024-01-22 DIAGNOSIS — I4819 Other persistent atrial fibrillation: Secondary | ICD-10-CM | POA: Diagnosis not present

## 2024-01-22 DIAGNOSIS — J189 Pneumonia, unspecified organism: Secondary | ICD-10-CM | POA: Diagnosis not present

## 2024-01-22 DIAGNOSIS — I4891 Unspecified atrial fibrillation: Secondary | ICD-10-CM | POA: Diagnosis not present

## 2024-01-22 DIAGNOSIS — I5033 Acute on chronic diastolic (congestive) heart failure: Secondary | ICD-10-CM | POA: Diagnosis not present

## 2024-01-22 LAB — BASIC METABOLIC PANEL
Anion gap: 16 — ABNORMAL HIGH (ref 5–15)
BUN: 18 mg/dL (ref 8–23)
CO2: 22 mmol/L (ref 22–32)
Calcium: 9.3 mg/dL (ref 8.9–10.3)
Chloride: 99 mmol/L (ref 98–111)
Creatinine, Ser: 0.85 mg/dL (ref 0.44–1.00)
GFR, Estimated: >60 mL/min (ref >60)
Glucose, Bld: 95 mg/dL (ref 70–99)
Potassium: 4.9 mmol/L (ref 3.5–5.1)
Sodium: 137 mmol/L (ref 135–145)

## 2024-01-22 MED ORDER — AMIODARONE HCL 200 MG PO TABS: 200.0000 mg | ORAL_TABLET | Freq: Every day | ORAL | Status: DC

## 2024-01-22 MED ORDER — LABETALOL HCL 200 MG PO TABS
200.0000 mg | ORAL_TABLET | Freq: Two times a day (BID) | ORAL | Status: DC
  Administered 2024-01-22: 200 mg via ORAL
  Filled 2024-01-22: qty 1

## 2024-01-22 NOTE — Progress Notes (Addendum)
 Patient Name: [name] Date of Encounter: 01/22/2024 [facility] Cardiologist: Maisie Fus, MD   Interval Summary  .    Patient reports "some wheezing" this morning but otherwise feels well. Denies focal complaints including chest pain, shortness of breath, palpitations.   Vital Signs .    Vitals:   01/21/24 1155 01/21/24 1500 01/21/24 1947 01/22/24 0301  BP: (!) 152/79 130/74 (!) 140/69 (!) 156/88  Pulse: 68 73 75 74  Resp: (!) 23 15 18 15   Temp:  98.3 F (36.8 C) 97.6 F (36.4 C) 98.1 F (36.7 C)  TempSrc:   Oral Oral  SpO2: 95% 96% 97% 96%  Weight:      Height:        Intake/Output Summary (Last 24 hours) at 01/22/2024 0835 Last data filed at 01/22/2024 0300 Gross per 24 hour  Intake 240 ml  Output 500 ml  Net -260 ml      01/15/2024    4:54 PM 12/27/2023    9:24 AM 11/25/2023    2:56 PM  Last 3 Weights  Weight (lbs) 205 lb 14.6 oz 206 lb 202 lb 9.6 oz  Weight (kg) 93.4 kg 93.441 kg 91.899 kg      Telemetry/ECG    Sinus rhythm - Personally Reviewed  Physical Exam .   GEN: No acute distress.   Neck: No JVD Cardiac: RRR, no murmurs, rubs, or gallops.  Respiratory: Right upper lobe rhonchi, right lower lobe crackles GI: Soft, nontender, non-distended  MS: trace  Assessment & Plan .     89 y.o. female with PMH of HTN, HLD, GERD, prior TIA/CVA, PAF, HFpEF, COPD and obesity presented with CP and SOB. CXR suggest either pleural effusion vs PNA. She was being treated with CAP and CHF, underwent thoracentesis 3/2. She was in afib RVR on admission, was placed on cardizem gtt and amiodarone load.   Atrial fibrillation with RVR Patient admitted with shortness of breath and chest pain was found to be in afib with RVR. She had previously been set up for outpatient DCCV in the setting of afib but had spontaneous cardioversion prior. Patient then with recurrent afib and was started on Amiodarone outpatient 12/27/23, compliant with Eliquis. Patient maintaining  NSR this morning, s/p DCCV on 3/4. Continue PO Amiodarone, Diltiazem. Continue Labetalol Continue Eliquis  Acute respiratory failure  COPD Patient with TTE this admission showing EF 55%, RVSP 37.66mmHg, severe LAE, dilated aortic root 39 mm. Is s/p thoracentesis on 3/2 with removed.  Patient with right upper lobe rhonchi, right lower lobe crackles on exam, though patient appears to be breathing comfortably. Repeat CXR ordered, may need additional diuresis today.  No significant peripheral volume appreciated on exam, continue PO lasix pending CXR.  Hypertension BP moderately elevated this admission.  For now, reasonable to continue Diltiazem 240mg , Labetalol 200mg  BID  Hyperlipidemia Continue home statin.  [facility] will sign off.   Medication Recommendations: Amiodarone 200 mg daily, diltiazem 240 g daily, labetalol 200 mg twice daily, Lasix 40 mg daily Other recommendations (labs, testing, etc): BMET in 1 week Follow up as an outpatient: We will schedule   For questions or updates, please contact [facility] Please consult [email or web address] for contact info under        Signed, Perlie Gold, PA-C    Patient seen and examined.  Agree with above documentation.  On exam, patient is alert and oriented, regular rate and rhythm, no murmurs, lungs CTAB, no LE edema or  JVD.  Appears euvolemic, continue p.o. Lasix.  She is maintaining sinus rhythm, will continue p.o. amiodarone, diltiazem, and labetalol.  Will schedule outpatient follow-up, okay for discharge today from cardiac standpoint.  Little Ishikawa, MD

## 2024-01-22 NOTE — TOC Transition Note (Signed)
 Transition of Care [facility]) - Discharge Note   Patient Details  Name: [name] MRN: [id number] Date of Birth: Aug 23, 1933  Transition of Care [facility]) CM/SW Contact:  Gala Lewandowsky, RN Phone Number: 01/22/2024, 1:42 PM   Clinical Narrative: Case Manager spoke with patient regarding home health services and patient declined the services. Patient is aware that if she needs services in the future to call her PCP. Family to transport patient home. No further home needs identified.    Final next level of care: Home/Self Care Barriers to Discharge: No Barriers Identified   Patient Goals and CMS Choice Patient states their goals for this hospitalization and ongoing recovery are:: to return to home once stable  Discharge Plan and Services Additional resources added to the After Visit Summary for   In-house Referral: NA Discharge Planning Services: CM Consult  Social Drivers of Health (SDOH) Interventions SDOH Screenings   Food Insecurity: No Food Insecurity (01/16/2024)  Housing: Low Risk  (01/16/2024)  Transportation Needs: No Transportation Needs (01/16/2024)  Utilities: Not At Risk (01/16/2024)  Social Connections: Socially Isolated (01/16/2024)  Tobacco Use: Low Risk  (01/15/2024)     Readmission Risk Interventions     No data to display

## 2024-01-22 NOTE — Discharge Summary (Signed)
 Physician Discharge Summary  [name] ZOX:[id number] DOB: [birth year] DOA: 01/15/2024  PCP: Hadley Pen, MD  Admit date: 01/15/2024 Discharge date: 01/22/2024  Admitted From: Home  Discharge disposition: Home with home health   Recommendations for Outpatient Follow-Up:   Follow up with your primary care provider in one week.  Check CBC, BMP, magnesium in the next visit Follow-up with cardiology clinic as scheduled by the clinic.   Discharge Diagnosis:   Principal Problem:   Acute on chronic diastolic CHF (congestive heart failure) (HCC) Active Problems:   Pleural effusion on right - s/p 700 ml thoracentesis 01-19-2024   Atrial fibrillation with RVR (HCC)   COPD (chronic obstructive pulmonary disease) (HCC)   Essential hypertension   Obesity, Class II, BMI 35-39.9   Chronic diastolic CHF (congestive heart failure) (HCC)   Persistent atrial fibrillation (HCC)   Discharge Condition: Improved.  Diet recommendation: Low sodium, heart healthy.  .  Wound care: None.  Code status: Full.   History of Present Illness:   [name] is a 89 y.o. female with past medical history of hypertension, prior stroke, hyperlipidemia, GERD presented to hospital with shortness of breath and chest pain for 3 days.  Patient was noted to be febrile.  Initial labs showed WBC 18.5.  Respiratory viral panel was negative for infection.  Chest x-ray showed right upper lobe opacity concerning for infection.  Patient was started on ceftriaxone and azithromycin and was admitted further workup.  During hospitalization, patient also had atrial fibrillation with RVR and cardiology was consulted.    Hospital Course:   Following conditions were addressed during hospitalization as listed below,  Viral pneumonia. CT scan of the chest was also performed that showed small to moderate right pleural effusion..  Patient initially received Rocephin Zithromax DuoNebs.  Pro-Cal was less than 0.10.  Flu  COVID RSV was negative.  Subsequently antibiotics were discontinued.  Pleural effusion on right - s/p 700 ml thoracentesis 01-19-2024.  Improved breathing.  On room air . Acute on chronic diastolic CHF (congestive heart failure) likely from A-fib with RVR. Initially received IV Lasix, currently on oral Lasix will be continued on discharge.  Status post cardioversion with normal sinus rhythm at this time.   Atrial fibrillation with RVR  Resolved.  Initially received Cardizem drip.  Patient received amiodarone load in the hospital but despite that had RVR so cardiology was consulted and underwent DC cardioversion 01/21/2024.  After cardioversion patient has maintained normal sinus rhythm..  Patient will continue amiodarone, Cardizem CD and Eliquis on discharge.  Communicated with cardiology prior to discharge.  Chronic diastolic CHF (congestive heart failure)  Recent 2D echocardiogram from 09-2022 with LVEF 60%.  2D echocardiogram on 01-17-2024 shows LVEF 55% given COPD unable to use large doses of beta-blocker.  Status post cardioversion.   Obesity, Class II, BMI 35-39.9 Body mass index is 35.34 kg/m.  Would benefit from continued weight loss as outpatient.   Essential hypertension On Cardizem CD and Lasix.  Will continue on discharge.   COPD (chronic obstructive pulmonary disease)  Will resume albuterol inhaler from home.   Debility weakness.  Patient does have a walker for ambulation at baseline.  PT recommended home health PT on discharge.   Disposition.  At this time, patient is stable for disposition home with outpatient PCP and cardiology follow-up.  Communicated with the patient's daughter at bedside prior to disposition.  Medical Consultants:   Cardiology Interventional radiology  Procedures:    DC cardioversion on 01/21/2024 Ultrasound-guided  thoracocentesis 01-19-24  Subjective:   Today, patient was seen and examined at bedside.  Feels okay.  Has mild cough but no shortness  of breath or dyspnea.  No palpitations  Discharge Exam:   Vitals:   01/21/24 1947 01/22/24 0301  BP: (!) 140/69 (!) 156/88  Pulse: 75 74  Resp: 18 15  Temp: 97.6 F (36.4 C) 98.1 F (36.7 C)  SpO2: 97% 96%   Vitals:   01/21/24 1155 01/21/24 1500 01/21/24 1947 01/22/24 0301  BP: (!) 152/79 130/74 (!) 140/69 (!) 156/88  Pulse: 68 73 75 74  Resp: (!) 23 15 18 15   Temp:  98.3 F (36.8 C) 97.6 F (36.4 C) 98.1 F (36.7 C)  TempSrc:   Oral Oral  SpO2: 95% 96% 97% 96%  Weight:      Height:       Body mass index is 35.34 kg/m.   General: Alert awake, not in obvious distress, obese built HENT: pupils equally reacting to light,  No scleral pallor or icterus noted. Oral mucosa is moist.  Chest:   Diminished breath sounds bilaterally mild crackles noted at the right base. CVS: S1 &S2 heard. Regular rate and rhythm. Abdomen: Soft, nontender, nondistended.  Bowel sounds are heard.   Extremities: No cyanosis, clubbing with lower extremity edema peripheral pulses are palpable. Psych: Alert, awake and oriented, normal mood CNS:  No cranial nerve deficits.  Moves all extremities Skin: Warm and dry.  No rashes noted.  The results of significant diagnostics from this hospitalization (including imaging, microbiology, ancillary and laboratory) are listed below for reference.     Diagnostic Studies:   CT CHEST WO CONTRAST Result Date: 01/16/2024 CLINICAL DATA:  Pneumonia, complication suspected, xray done EXAM: CT CHEST WITHOUT CONTRAST TECHNIQUE: Multidetector CT imaging of the chest was performed following the standard protocol without IV contrast. RADIATION DOSE REDUCTION: This exam was performed according to the departmental dose-optimization program which includes automated exposure control, adjustment of the mA and/or kV according to patient size and/or use of iterative reconstruction technique. COMPARISON:  X-ray 01/15/2024, CT 11/25/2022 FINDINGS: Cardiovascular: Heart size is upper  limits of normal. Trace pericardial fluid. Thoracic aorta is nonaneurysmal. Scattered atherosclerotic vascular calcifications of the aorta and coronary arteries. Central pulmonary vasculature remains mildly dilated. Mediastinum/Nodes: Multiple calcified mediastinal and right hilar lymph nodes without evidence of new or progressive adenopathy. No axillary lymphadenopathy. Trachea and esophagus within normal limits. Lungs/Pleura: Small-moderate sized right-sided pleural effusion, increased. Dependent right basilar atelectasis and patchy airspace opacity within the more superior aspect of the right lower lobe. Additionally, there is interlobular septal thickening and patchy ground-glass attenuation throughout the right upper lobe. Left lung is clear. No pneumothorax. Upper Abdomen: 14 mm left adrenal gland nodule with internal density of 13 HU. No acute abnormality. Musculoskeletal: No new or acute bony or chest wall abnormality. IMPRESSION: 1. Small-moderate sized right-sided pleural effusion, increased. 2. Dependent right basilar atelectasis and patchy airspace opacity within the more superior aspect of the right lower lobe. Additionally, there is interlobular septal thickening and patchy ground-glass attenuation throughout the right upper lobe. Findings may represent a combination of asymmetric edema with atelectasis and pneumonia. 3. Incidentally noted 14 mm left adrenal gland nodule, likely a benign adenoma. 4. Aortic and coronary artery atherosclerosis (ICD10-I70.0). Electronically Signed   By: Duanne Guess D.O.   On: 01/16/2024 10:46   DG Chest 2 View Result Date: 01/15/2024 CLINICAL DATA:  Chest pain, dyspnea with exertion. EXAM: CHEST - 2 VIEW COMPARISON:  January 13, 2023. FINDINGS: Stable cardiomediastinal silhouette. Small right pleural effusion is noted with adjacent subsegmental atelectasis. Right upper lobe airspace opacity is noted concerning for pneumonia. Left lung is clear. Bony thorax is  unremarkable. IMPRESSION: Right upper lobe airspace opacity is again noted concerning for pneumonia. Small right pleural effusion is noted with adjacent subsegmental atelectasis. Electronically Signed   By: Lupita Raider M.D.   On: 01/15/2024 18:22     Labs:   Basic Metabolic Panel: Recent Labs  Lab 01/17/24 0504 01/18/24 0441 01/20/24 0517 01/21/24 0630 01/22/24 0501  NA 140 140 140 139 137  K 3.5 4.9 3.9 4.5 4.9  CL 100 107 97* 99 99  CO2 29 28 31  33* 22  GLUCOSE 122* 118* 109* 104* 95  BUN 10 13 11 15 18   CREATININE 0.91 0.77 0.91 1.04* 0.85  CALCIUM 9.2 9.1 9.0 9.4 9.3  MG 2.0 2.1 2.0 2.0  --    GFR Estimated Creatinine Clearance: 50.7 mL/min (by C-G formula based on SCr of 0.85 mg/dL). Liver Function Tests: Recent Labs  Lab 01/17/24 0504 01/18/24 0441 01/20/24 0517  AST 16 13* 18  ALT 20 20 20   ALKPHOS 60 57 60  BILITOT 0.5 0.3 0.6  PROT 5.8* 5.9* 5.5*  ALBUMIN 3.4* 3.3* 3.1*   No results for input(s): "LIPASE", "AMYLASE" in the last 168 hours. No results for input(s): "AMMONIA" in the last 168 hours. Coagulation profile No results for input(s): "INR", "PROTIME" in the last 168 hours.  CBC: Recent Labs  Lab 01/15/24 1719 01/17/24 0504 01/18/24 0441 01/20/24 0517 01/21/24 0630  WBC 10.5 9.2 9.2 8.4 7.5  NEUTROABS  --  6.6 6.4 5.4  --   HGB 12.5 11.5* 11.0* 11.2* 11.4*  HCT 39.3 35.6* 35.2* 35.4* 36.3  MCV 88.7 88.3 89.6 88.7 89.0  PLT 278 230 226 224 236   Cardiac Enzymes: No results for input(s): "CKTOTAL", "CKMB", "CKMBINDEX", "TROPONINI" in the last 168 hours. BNP: Invalid input(s): "POCBNP" CBG: No results for input(s): "GLUCAP" in the last 168 hours. D-Dimer No results for input(s): "DDIMER" in the last 72 hours. Hgb A1c No results for input(s): "HGBA1C" in the last 72 hours. Lipid Profile No results for input(s): "CHOL", "HDL", "LDLCALC", "TRIG", "CHOLHDL", "LDLDIRECT" in the last 72 hours. Thyroid function studies No results for  input(s): "TSH", "T4TOTAL", "T3FREE", "THYROIDAB" in the last 72 hours.  Invalid input(s): "FREET3" Anemia work up No results for input(s): "VITAMINB12", "FOLATE", "FERRITIN", "TIBC", "IRON", "RETICCTPCT" in the last 72 hours. Microbiology Recent Results (from the past 240 hours)  Resp panel by RT-PCR (RSV, Flu A&B, Covid) Anterior Nasal Swab     Status: None   Collection Time: 01/15/24  8:12 PM   Specimen: Anterior Nasal Swab  Result Value Ref Range Status   SARS Coronavirus 2 by RT PCR NEGATIVE NEGATIVE Final   Influenza A by PCR NEGATIVE NEGATIVE Final   Influenza B by PCR NEGATIVE NEGATIVE Final    Comment: (NOTE) The Xpert Xpress SARS-CoV-2/FLU/RSV plus assay is intended as an aid in the diagnosis of influenza from Nasopharyngeal swab specimens and should not be used as a sole basis for treatment. Nasal washings and aspirates are unacceptable for Xpert Xpress SARS-CoV-2/FLU/RSV testing.  Fact Sheet for Patients: BloggerCourse.com  Fact Sheet for Healthcare Providers: SeriousBroker.it  This test is not yet approved or cleared by the [place] FDA and has been authorized for detection and/or diagnosis of SARS-CoV-2 by FDA under an Emergency Use Authorization (EUA). This EUA  will remain in effect (meaning this test can be used) for the duration of the COVID-19 declaration under Section 564(b)(1) of the Act, 21 U.S.C. section 360bbb-3(b)(1), unless the authorization is terminated or revoked.     Resp Syncytial Virus by PCR NEGATIVE NEGATIVE Final    Comment: (NOTE) Fact Sheet for Patients: BloggerCourse.com  Fact Sheet for Healthcare Providers: SeriousBroker.it  This test is not yet approved or cleared by the [place] FDA and has been authorized for detection and/or diagnosis of SARS-CoV-2 by FDA under an Emergency Use Authorization (EUA). This EUA will remain in  effect (meaning this test can be used) for the duration of the COVID-19 declaration under Section 564(b)(1) of the Act, 21 U.S.C. section 360bbb-3(b)(1), unless the authorization is terminated or revoked.  Performed at [facility], [address] [place], [place] [address]   Blood culture (routine x 2)     Status: None   Collection Time: 01/15/24  8:40 PM   Specimen: BLOOD  Result Value Ref Range Status   Specimen Description BLOOD LEFT ANTECUBITAL  Final   Special Requests   Final    BOTTLES DRAWN AEROBIC AND ANAEROBIC Blood Culture results may not be optimal due to an inadequate volume of blood received in culture bottles   Culture   Final    NO GROWTH 5 DAYS Performed at [facility], [address] [place], [place] [address]    Report Status 01/20/2024 FINAL  Final  Blood culture (routine x 2)     Status: None   Collection Time: 01/15/24  8:40 PM   Specimen: BLOOD RIGHT HAND  Result Value Ref Range Status   Specimen Description BLOOD RIGHT HAND  Final   Special Requests   Final    BOTTLES DRAWN AEROBIC AND ANAEROBIC Blood Culture results may not be optimal due to an inadequate volume of blood received in culture bottles   Culture   Final    NO GROWTH 5 DAYS Performed at [facility], [address] [place], [place] [address]    Report Status 01/20/2024 FINAL  Final     Discharge Instructions:   Discharge Instructions     Call MD for:  difficulty breathing, headache or visual disturbances   Complete by: As directed    Diet - low sodium heart healthy   Complete by: As directed    Discharge instructions   Complete by: As directed    Follow-up with your primary care provider in 1 week.  Check blood work at that time.  Seek medical attention for worsening symptoms.  Follow-up with cardiology as outpatient as scheduled by the clinic.   Increase activity slowly   Complete by: As directed       Allergies as of 01/22/2024       Reactions   Keflex  [cephalexin] Other (See Comments)   "makes pt feel really bad" Tolerated Cephalosporin Date: 01/15/23.   Morphine Anxiety, Other (See Comments)   Pt states this made her feel like she was going to die        Medication List     STOP taking these medications    diltiazem 30 MG tablet Commonly known as: CARDIZEM       TAKE these medications    acetaminophen 500 MG tablet Commonly known as: TYLENOL Take 2 tablets (1,000 mg total) by mouth every 8 (eight) hours. What changed:  when to take this reasons to take this   albuterol 108 (90  Base) MCG/ACT inhaler Commonly known as: VENTOLIN HFA Inhale 2 puffs into the lungs every 4 (four) hours as needed for wheezing or shortness of breath.   amiodarone 200 MG tablet Commonly known as: PACERONE Take 1 tablet (200 mg total) by mouth daily.   cetirizine 10 MG tablet Commonly known as: ZYRTEC Take 10 mg by mouth daily.   cyanocobalamin 1000 MCG tablet Take 1,000 mcg by mouth every other day. Vitamin B12   diltiazem 240 MG 24 hr capsule Commonly known as: CARDIZEM CD Take 1 capsule (240 mg total) by mouth daily.   Eliquis 5 MG Tabs tablet Generic drug: apixaban TAKE 1 TABLET BY MOUTH TWICE A DAY   furosemide 40 MG tablet Commonly known as: LASIX Take 1 tablet (40 mg total) by mouth daily.   gabapentin 300 MG capsule Commonly known as: NEURONTIN Take 300-600 mg by mouth See admin instructions. Take one capsule (300 mg) in the morning and two capsules (600 mg) in the evening.   ipratropium-albuterol 0.5-2.5 (3) MG/3ML Soln Commonly known as: DUONEB Take 3 mLs by nebulization every 6 (six) hours as needed (shortness of breath).   Klor-Con M20 20 MEQ tablet Generic drug: potassium chloride SA Take 1 tablet by mouth daily.   labetalol 200 MG tablet Commonly known as: NORMODYNE Take 200 mg by mouth 2 (two) times daily.   methocarbamol 750 MG tablet Commonly known as: ROBAXIN Take 750-1,500 mg by mouth See admin  instructions. Take 750 mg in the morning and 1500 mg in the PM   mirtazapine 15 MG tablet Commonly known as: REMERON Take 15 mg by mouth at bedtime.   multivitamin with minerals Tabs tablet Take 1 tablet by mouth daily. Fish oil   OMEGA-3 FISH OIL PO Take 1 capsule by mouth daily.   pravastatin 20 MG tablet Commonly known as: PRAVACHOL Take 20 mg by mouth at bedtime.   PRESERVISION AREDS 2 PO Take 1 tablet by mouth 2 (two) times daily. Vision Shield   sucralfate 1 g tablet Commonly known as: CARAFATE Take 1 g by mouth with breakfast, with lunch, and with evening meal.   vitamin C 1000 MG tablet Take 1,000 mg by mouth daily.   Vitamin D3 125 MCG (5000 UT) Tabs Take 5,000 Units by mouth daily.   Vitamin E 400 units Tabs Take 400 Units by mouth daily.   Voltaren 1 % Gel Generic drug: diclofenac Sodium Apply 2 g topically daily as needed (pain).        Follow-up Information     Hadley Pen, MD Follow up in 1 week(s).   Specialty: Family Medicine Contact information: [address] [place] [address] [phone]                  Time coordinating discharge: 39 minutes  Signed:  Laurel Smeltz  [facility] 01/22/2024, 1:33 PM

## 2024-01-22 NOTE — Evaluation (Signed)
 Occupational Therapy Evaluation Patient Details Name: [name] MRN: [id number] DOB: 05/28/1933 Today's Date: 01/22/2024   History of Present Illness   89 y.o. female who presents  01/15/2024 with worsening shortness of breath, fatigue, peripheral edema, and cough. Chest x-ray demonstrated right upper lobe opacity concerning for infection. Admitted for treatment of CAP. 3/2 Thoracentesis 3/3 s/p cardioversion WUJ:[id number], prior TIA/stroke, hyperlipidemia, GERD, paroxysmal afib, diastolic heart failure, COPD, obesity,     Clinical Impressions At baseline, pt completes dressing, grooming, and toileting Independent to Mod I, showering with Contact guard assist for transfers and Mod I to Supervision for remainder of task, and functional mobility household distances with a RW with Mod I. At baseline, pt receives assistance from family for IADLs, including medication management, home management tasks, and transportation. Pt now presents with decreased activity tolerance, decreased balance, and mildly decreased safety and independence with functional tasks. Pt currently demonstrates ability to complete ADLs Mod I to Contact guard assist and functional mobility/transfers with a RW with Contact guard assist. Pt's HR in the upper-60s to low-70 and O2 sat >/ 96% on RA throughout session. Pt will benefit from acute skilled OT services to address deficits outlined below and to increase safety and independence with functional tasks. No post-acute skilled OT needs anticipated at this time.     If plan is discharge home, recommend the following:   A little help with walking and/or transfers;A little help with bathing/dressing/bathroom;Assistance with cooking/housework;Assist for transportation;Help with stairs or ramp for entrance     Functional Status Assessment   Patient has had a recent decline in their functional status and demonstrates the ability to make significant improvements in function in  a reasonable and predictable amount of time.     Equipment Recommendations   None recommended by OT (Pt already has needed equipment)     Recommendations for Other Services         Precautions/Restrictions   Precautions Precautions: Fall Restrictions Weight Bearing Restrictions Per Provider Order: No     Mobility Bed Mobility               General bed mobility comments: Pt sitting in recliner at beginning and end of session    Transfers Overall transfer level: Needs assistance Equipment used: Rolling walker (2 wheels) Transfers: Sit to/from Stand, Bed to chair/wheelchair/BSC Sit to Stand: Contact guard assist     Step pivot transfers: Contact guard assist            Balance Overall balance assessment: Needs assistance Sitting-balance support: No upper extremity supported, Feet supported Sitting balance-Leahy Scale: Good     Standing balance support: Single extremity supported, Bilateral upper extremity supported, During functional activity, Reliant on assistive device for balance Standing balance-Leahy Scale: Poor Standing balance comment: requires UE support to maintain balance                           ADL either performed or assessed with clinical judgement   ADL Overall ADL's : Needs assistance/impaired Eating/Feeding: Modified independent;Sitting   Grooming: Set up;Sitting   Upper Body Bathing: Contact guard assist;Sitting   Lower Body Bathing: Contact guard assist;Sit to/from stand   Upper Body Dressing : Contact guard assist;Set up;Sitting Upper Body Dressing Details (indicate cue type and reason): assist for managing lines; otherwise Set up Lower Body Dressing: Minimal assistance;Sit to/from stand   Toilet Transfer: Contact guard assist;BSC/3in1;Rolling walker (2 wheels) (step-pivot) Toilet Transfer Details (indicate cue type and reason):  simulated at [employer] and Hygiene: Contact  guard assist;Sit to/from stand       Functional mobility during ADLs: Contact guard assist;Minimal assistance;Rolling walker (2 wheels) General ADL Comments: Pt presents with decreased activity tolerance and decreased balacen affecting functional level. However, pt is near baseline PLOF.     Vision Patient Visual Report: No change from baseline       Perception         Praxis         Pertinent Vitals/Pain Pain Assessment Pain Assessment: Faces Faces Pain Scale: Hurts even more Pain Location: R knee with ambulation; headache Pain Descriptors / Indicators: Grimacing, Guarding, Sore Pain Intervention(s): Limited activity within patient's tolerance, Monitored during session, Repositioned, Patient requesting pain meds-RN notified     Extremity/Trunk Assessment Upper Extremity Assessment Upper Extremity Assessment: Right hand dominant;Overall [facility] for tasks assessed   Lower Extremity Assessment Lower Extremity Assessment: Defer to PT evaluation   Cervical / Trunk Assessment Cervical / Trunk Assessment: Kyphotic   Communication Communication Communication: Impaired Factors Affecting Communication: Hearing impaired   Cognition Arousal: Alert Behavior During Therapy: WFL for tasks assessed/performed Cognition: No apparent impairments             OT - Cognition Comments: AAOx4 and pleasant througout session. Cognition WFL for tasks assessed, not formally screened of assessed                 Following commands: Intact       Cueing  General Comments   Cueing Techniques: Verbal cues;Gestural cues;Tactile cues  HR in the upper-60s to low-70 and O2 sat >/ 96% on RA throughout session. Pt's daughter present throughout session.   Exercises     Shoulder Instructions      Home Living Family/patient expects to be discharged to:: Private residence Living Arrangements: Children (daughter and SIL) Available Help at Discharge: Family;Available 24 hours/day Type  of Home: House Home Access: Stairs to enter [employer] of Steps: 1   Home Layout: Multi-level;Able to live on main level with bedroom/bathroom (Pt does not go upstairs) Alternate Level Stairs-Number of Steps: flight   Bathroom Shower/Tub: Producer, television/film/video: Standard (BSC over top) Bathroom Accessibility: Yes How Accessible: Accessible via walker Home Equipment: Shower seat;BSC/3in1;Grab bars - tub/shower;Hand held shower head;Rollator (4 wheels);Rolling Walker (2 wheels);Wheelchair - manual          Prior Functioning/Environment Prior Level of Function : Independent/Modified Independent;Needs assist             Mobility Comments: Pt uses a RW for household distances and a wheelchair for community mobility ADLs Comments: Independent to Mod I with dressing, grooming, and toileting. CGA for shower transfers and otherwise Mod I to Supervision for bathing. Daughter assist with medication management, home management tasks, and transportation    OT Problem List: Decreased activity tolerance;Impaired balance (sitting and/or standing)   OT Treatment/Interventions: Self-care/ADL training;Therapeutic exercise;DME and/or AE instruction;Energy conservation;Therapeutic activities;Patient/family education;Balance training      OT Goals(Current goals can be found in the care plan section)   Acute Rehab OT Goals Patient Stated Goal: to return home OT Goal Formulation: With patient/family Time For Goal Achievement: 02/05/24 Potential to Achieve Goals: Good ADL Goals Pt Will Perform Upper Body Bathing: with modified independence;sitting Pt Will Perform Lower Body Bathing: with supervision;sit to/from stand Pt Will Perform Lower Body Dressing: with modified independence;sitting/lateral leans;sit to/from stand Pt Will Transfer to Toilet: with modified independence;ambulating;bedside commode (with least restrictive AD) Pt Will  Perform Toileting - Clothing  Manipulation and hygiene: with modified independence;sit to/from stand Pt/caregiver will Perform Home Exercise Program: Both right and left upper extremity;With theraband;Independently;With written HEP provided (Increased activity tolerance) Additional ADL Goal #1: Patient will demonstrate ability to Independently state 3 energy conservation strategies to increase safety and independence with functional tasks in the home.   OT Frequency:  Min 1X/week    Co-evaluation              AM-PAC OT "6 Clicks" Daily Activity     Outcome Measure Help from another person eating meals?: None Help from another person taking care of personal grooming?: A Little Help from another person toileting, which includes using toliet, bedpan, or urinal?: A Little Help from another person bathing (including washing, rinsing, drying)?: A Little Help from another person to put on and taking off regular upper body clothing?: A Little Help from another person to put on and taking off regular lower body clothing?: A Little 6 Click Score: 19   End of Session Equipment Utilized During Treatment: Rolling walker (2 wheels) Nurse Communication: Mobility status;Patient requests pain meds  Activity Tolerance: Patient tolerated treatment well Patient left: in chair;with call bell/phone within reach;with family/visitor present  OT Visit Diagnosis: Unsteadiness on feet (R26.81);Other (comment) (decreased activity tolerance)                Time: 1610-9604 OT Time Calculation (min): 16 min Charges:  OT General Charges $OT Visit: 1 Visit OT Evaluation $OT Eval Low Complexity: 1 Low  Wesson Stith "Kyle" M., OTR/L, MA Acute Rehab [phone]  Lendon Colonel 01/22/2024, 10:10 AM

## 2024-01-22 NOTE — Plan of Care (Signed)
 Problem: Education: Goal: Knowledge of General Education information will improve Description: Including pain rating scale, medication(s)/side effects and non-pharmacologic comfort measures 01/22/2024 1342 by Sylvan Cheese, RN Outcome: Adequate for Discharge 01/22/2024 1342 by Sylvan Cheese, RN Outcome: Adequate for Discharge   Problem: Health Behavior/Discharge Planning: Goal: Ability to manage health-related needs will improve 01/22/2024 1342 by Sylvan Cheese, RN Outcome: Adequate for Discharge 01/22/2024 1342 by Sylvan Cheese, RN Outcome: Adequate for Discharge   Problem: Clinical Measurements: Goal: Ability to maintain clinical measurements within normal limits will improve 01/22/2024 1342 by Sylvan Cheese, RN Outcome: Adequate for Discharge 01/22/2024 1342 by Sylvan Cheese, RN Outcome: Adequate for Discharge Goal: Will remain free from infection 01/22/2024 1342 by Sylvan Cheese, RN Outcome: Adequate for Discharge 01/22/2024 1342 by Sylvan Cheese, RN Outcome: Adequate for Discharge Goal: Diagnostic test results will improve 01/22/2024 1342 by Sylvan Cheese, RN Outcome: Adequate for Discharge 01/22/2024 1342 by Sylvan Cheese, RN Outcome: Adequate for Discharge Goal: Respiratory complications will improve 01/22/2024 1342 by Sylvan Cheese, RN Outcome: Adequate for Discharge 01/22/2024 1342 by Sylvan Cheese, RN Outcome: Adequate for Discharge Goal: Cardiovascular complication will be avoided 01/22/2024 1342 by Sylvan Cheese, RN Outcome: Adequate for Discharge 01/22/2024 1342 by Sylvan Cheese, RN Outcome: Adequate for Discharge   Problem: Activity: Goal: Risk for activity intolerance will decrease 01/22/2024 1342 by Sylvan Cheese, RN Outcome: Adequate for Discharge 01/22/2024 1342 by Sylvan Cheese, RN Outcome: Adequate for Discharge   Problem: Nutrition: Goal: Adequate nutrition will be maintained 01/22/2024 1342 by  Sylvan Cheese, RN Outcome: Adequate for Discharge 01/22/2024 1342 by Sylvan Cheese, RN Outcome: Adequate for Discharge   Problem: Coping: Goal: Level of anxiety will decrease 01/22/2024 1342 by Sylvan Cheese, RN Outcome: Adequate for Discharge 01/22/2024 1342 by Sylvan Cheese, RN Outcome: Adequate for Discharge   Problem: Elimination: Goal: Will not experience complications related to bowel motility 01/22/2024 1342 by Sylvan Cheese, RN Outcome: Adequate for Discharge 01/22/2024 1342 by Sylvan Cheese, RN Outcome: Adequate for Discharge Goal: Will not experience complications related to urinary retention 01/22/2024 1342 by Sylvan Cheese, RN Outcome: Adequate for Discharge 01/22/2024 1342 by Sylvan Cheese, RN Outcome: Adequate for Discharge   Problem: Pain Managment: Goal: General experience of comfort will improve and/or be controlled 01/22/2024 1342 by Sylvan Cheese, RN Outcome: Adequate for Discharge 01/22/2024 1342 by Sylvan Cheese, RN Outcome: Adequate for Discharge   Problem: Safety: Goal: Ability to remain free from injury will improve 01/22/2024 1342 by Sylvan Cheese, RN Outcome: Adequate for Discharge 01/22/2024 1342 by Sylvan Cheese, RN Outcome: Adequate for Discharge   Problem: Skin Integrity: Goal: Risk for impaired skin integrity will decrease 01/22/2024 1342 by Sylvan Cheese, RN Outcome: Adequate for Discharge 01/22/2024 1342 by Sylvan Cheese, RN Outcome: Adequate for Discharge   Problem: Education: Goal: Knowledge of disease or condition will improve 01/22/2024 1342 by Sylvan Cheese, RN Outcome: Adequate for Discharge 01/22/2024 1342 by Sylvan Cheese, RN Outcome: Adequate for Discharge Goal: Understanding of medication regimen will improve 01/22/2024 1342 by Sylvan Cheese, RN Outcome: Adequate for Discharge 01/22/2024 1342 by Sylvan Cheese, RN Outcome: Adequate for Discharge Goal:  Individualized Educational Video(s) 01/22/2024 1342 by Sylvan Cheese, RN Outcome: Adequate for Discharge 01/22/2024 1342 by Sylvan Cheese, RN Outcome: Adequate for Discharge   Problem: Activity: Goal: Ability to tolerate increased activity will  improve 01/22/2024 1342 by Sylvan Cheese, RN Outcome: Adequate for Discharge 01/22/2024 1342 by Sylvan Cheese, RN Outcome: Adequate for Discharge   Problem: Cardiac: Goal: Ability to achieve and maintain adequate cardiopulmonary perfusion will improve 01/22/2024 1342 by Sylvan Cheese, RN Outcome: Adequate for Discharge 01/22/2024 1342 by Sylvan Cheese, RN Outcome: Adequate for Discharge   Problem: Health Behavior/Discharge Planning: Goal: Ability to safely manage health-related needs after discharge will improve 01/22/2024 1342 by Sylvan Cheese, RN Outcome: Adequate for Discharge 01/22/2024 1342 by Sylvan Cheese, RN Outcome: Adequate for Discharge

## 2024-01-23 ENCOUNTER — Telehealth: Payer: Self-pay | Admitting: Cardiology

## 2024-01-23 ENCOUNTER — Encounter: Payer: Self-pay | Admitting: Internal Medicine

## 2024-01-23 NOTE — Telephone Encounter (Signed)
 Patient c/o Palpitations:  High priority if patient c/o lightheadedness, shortness of breath, or chest pain  How long have you had palpitations/irregular HR/ Afib? Are you having the symptoms now?  Son in law, [name], is following up. He states yesterday patient was discharged. Cardioversion was Tuesday. She's back in afib.   Are you currently experiencing lightheadedness, SOB or CP?   Do you have a history of afib (atrial fibrillation) or irregular heart rhythm?  Yes  Have you checked your BP or HR? (document readings if available):  BP is fine. Patient lost almost 1 lb over night. HR 120's, BP 140's/70's  Are you experiencing any other symptoms?  No

## 2024-01-23 NOTE — Telephone Encounter (Signed)
 New Message:      Dr Beverely Pace would like for you to get in touch with Dr Bjorn Pippin please. His mother is a patient of Dr Bjorn Pippin. Patient was discharged from the hospital  recently. He said patient is back in Afib today. He said he only wants to talk to Dr Bjorn Pippin please.

## 2024-01-23 NOTE — Telephone Encounter (Signed)
 Patient identification verified by 2 forms. Katelyn Flood, RN     Called and spoke to patient's caretaker [name] states:  - [name]'s HR was 120 this morning per the kardia mobile device.  - [name] took scheduled dose of cardizem (240mg ) and rechecked HR. HR was still 100-110 after 2nd dose. [name] gave patient a 3rd dose of cardizem but was unable to check re-check HR at that time.  - BP 140/77 this morning.  - [name] experiencing DOE and fatigue.  - [name] not present with patient during the call.                Interventions/Plan: - Information reviewed with DOD (Kardie TOBB. Per DOD patient should go to/call EMS and be evaluated at ER. - [name] will call his wife and have her transport patient to the ER.   Reviewed ED warning signs/precautions  [name] agrees with plan, no further questions at this time

## 2024-01-23 NOTE — Telephone Encounter (Signed)
 Please see documentation in 3/6 telephone encounter

## 2024-01-24 ENCOUNTER — Encounter (HOSPITAL_COMMUNITY): Payer: Self-pay | Admitting: Physician Assistant

## 2024-01-24 ENCOUNTER — Ambulatory Visit (HOSPITAL_COMMUNITY)
Admission: RE | Admit: 2024-01-24 | Discharge: 2024-01-24 | Disposition: A | Discharge status: Home / Self Care | Source: Ambulatory Visit | Attending: Physician Assistant | Admitting: Physician Assistant

## 2024-01-24 VITALS — BP 114/70 | HR 97 | Ht 64.0 in | Wt 203.0 lb

## 2024-01-24 DIAGNOSIS — I5032 Chronic diastolic (congestive) heart failure: Secondary | ICD-10-CM | POA: Insufficient documentation

## 2024-01-24 DIAGNOSIS — D6869 Other thrombophilia: Secondary | ICD-10-CM | POA: Insufficient documentation

## 2024-01-24 DIAGNOSIS — I4819 Other persistent atrial fibrillation: Secondary | ICD-10-CM | POA: Insufficient documentation

## 2024-01-24 DIAGNOSIS — J449 Chronic obstructive pulmonary disease, unspecified: Secondary | ICD-10-CM | POA: Insufficient documentation

## 2024-01-24 DIAGNOSIS — I77819 Aortic ectasia, unspecified site: Secondary | ICD-10-CM | POA: Insufficient documentation

## 2024-01-24 DIAGNOSIS — R0989 Other specified symptoms and signs involving the circulatory and respiratory systems: Secondary | ICD-10-CM | POA: Diagnosis not present

## 2024-01-24 DIAGNOSIS — Z8673 Personal history of transient ischemic attack (TIA), and cerebral infarction without residual deficits: Secondary | ICD-10-CM | POA: Insufficient documentation

## 2024-01-24 DIAGNOSIS — Z7901 Long term (current) use of anticoagulants: Secondary | ICD-10-CM | POA: Insufficient documentation

## 2024-01-24 DIAGNOSIS — Z79899 Other long term (current) drug therapy: Secondary | ICD-10-CM | POA: Insufficient documentation

## 2024-01-24 DIAGNOSIS — I110 Hypertensive heart disease with heart failure: Secondary | ICD-10-CM | POA: Insufficient documentation

## 2024-01-24 DIAGNOSIS — Z5181 Encounter for therapeutic drug level monitoring: Secondary | ICD-10-CM

## 2024-01-24 MED ORDER — AMIODARONE HCL 200 MG PO TABS: ORAL_TABLET | ORAL | 3 refills | Status: DC

## 2024-01-24 NOTE — Patient Instructions (Addendum)
 Increase amiodarone to 200mg  twice a day for 14 days then reduce to once a day    Cardioversion scheduled for: Tuesday, March 25th   - Arrive at the [facility] and go to admitting at 6:30AM   - Do not eat or drink anything after midnight the night prior to your procedure.   - Take all your morning medication (except diabetic medications) with a sip of water prior to arrival.  - You will not be able to drive home after your procedure.    - Do NOT miss any doses of your blood thinner - if you should miss a dose please notify our office immediately.   - If you feel as if you go back into normal rhythm prior to scheduled cardioversion, please notify our office immediately.   If your procedure is canceled in the cardioversion suite you will be charged a cancellation fee.

## 2024-01-24 NOTE — H&P (View-Only) (Signed)
 Primary Care Physician: Hadley Pen, MD Primary Cardiologist: Maisie Fus, MD Electrophysiologist: None  Referring Physician: ED   [name] is a 89 y.o. female with a history of HLD, HTN, TIA, , COPD, CHF, atrial fibrillation who presents for follow up in the [facility]. Patient presented to ER on 01/15/24 for 3 days of SOB and chest discomfort. Felt to have viral CAP. She was also acute fluid overloaded. Underwent R thoracentesis, 700 mL. She was previously on amiodarone but this had been discontinued 08/2023 as she was maintaining SR. She had recurrent afib and amiodarone was restarted 12/27/23. During her admission, her amiodarone was increased to 400 mg BID and she underwent DCCV on 01/21/24. Patient is on Eliquis for stroke prevention.   Patient presents today for follow up for atrial fibrillation and amiodarone monitoring. She reports that she went back into afib 01/23/24 with elevated heart rates and feeling unwell. She describes her symptoms as a generalized weakness and palpitations. She denies SOB currently. No chest pain. No bleeding issues on anticoagulation.   Today, she denies symptoms of chest pain, shortness of breath, orthopnea, PND, lower extremity edema, dizziness, presyncope, syncope, snoring, daytime somnolence, bleeding, or neurologic sequela. The patient is tolerating medications without difficulties and is otherwise without complaint today.    Atrial Fibrillation Risk Factors:  she does not have symptoms or diagnosis of sleep apnea. she does not have a history of rheumatic fever.   Atrial Fibrillation Management history:  Previous antiarrhythmic drugs: amiodarone  Previous cardioversions: 01/21/24 Previous ablations: none Anticoagulation history: Eliquis  ROS- All systems are reviewed and negative except as per the HPI above.  Past Medical History:  Diagnosis Date   Arthritis    Atrial fibrillation, transient (HCC)     post op hip surgery; May 2021   Closed fracture of right fibula and tibia 01/13/2023   Diverticulitis    Dyspnea    with exertion    GERD (gastroesophageal reflux disease)    Hyperlipidemia    Hypertension    Stroke (HCC)    hx opf mini strokes    TIA (transient ischemic attack)     Current Outpatient Medications  Medication Sig Dispense Refill   acetaminophen (TYLENOL) 500 MG tablet Take 2 tablets (1,000 mg total) by mouth every 8 (eight) hours. (Patient taking differently: Take 1,000 mg by mouth every 8 (eight) hours as needed for mild pain (pain score 1-3), moderate pain (pain score 4-6) or headache.) 30 tablet 0   albuterol (VENTOLIN HFA) 108 (90 Base) MCG/ACT inhaler Inhale 2 puffs into the lungs every 4 (four) hours as needed for wheezing or shortness of breath.     amiodarone (PACERONE) 200 MG tablet Take 1 tablet (200 mg total) by mouth daily. 90 tablet 3   Ascorbic Acid (VITAMIN C) 1000 MG tablet Take 1,000 mg by mouth daily.     cetirizine (ZYRTEC) 10 MG tablet Take 10 mg by mouth daily.      Cholecalciferol (VITAMIN D3) 125 MCG (5000 UT) TABS Take 5,000 Units by mouth daily.     cyanocobalamin 1000 MCG tablet Take 1,000 mcg by mouth every other day. Vitamin B12     diclofenac Sodium (VOLTAREN) 1 % GEL Apply 2 g topically daily as needed (pain).     diltiazem (CARDIZEM CD) 240 MG 24 hr capsule Take 1 capsule (240 mg total) by mouth daily. 30 capsule 1   ELIQUIS 5 MG TABS tablet TAKE 1 TABLET BY  MOUTH TWICE A DAY 180 tablet 1   furosemide (LASIX) 40 MG tablet Take 1 tablet (40 mg total) by mouth daily. 90 tablet 3   gabapentin (NEURONTIN) 300 MG capsule Take 300-600 mg by mouth See admin instructions. Take one capsule (300 mg) in the morning and two capsules (600 mg) in the evening.     ipratropium-albuterol (DUONEB) 0.5-2.5 (3) MG/3ML SOLN Take 3 mLs by nebulization every 6 (six) hours as needed (shortness of breath). 360 mL 0   labetalol (NORMODYNE) 200 MG tablet Take 200 mg by  mouth 2 (two) times daily.     methocarbamol (ROBAXIN) 750 MG tablet Take 750-1,500 mg by mouth See admin instructions. Take 750 mg in the morning and 1500 mg in the PM     mirtazapine (REMERON) 15 MG tablet Take 15 mg by mouth at bedtime.     Multiple Vitamin (MULTIVITAMIN WITH MINERALS) TABS tablet Take 1 tablet by mouth daily. Fish oil     Multiple Vitamins-Minerals (PRESERVISION AREDS 2 PO) Take 1 tablet by mouth 2 (two) times daily. Vision Shield     Omega-3 Fatty Acids (OMEGA-3 FISH OIL PO) Take 1 capsule by mouth daily.     potassium chloride SA (KLOR-CON M20) 20 MEQ tablet Take 1 tablet by mouth daily.     pravastatin (PRAVACHOL) 20 MG tablet Take 20 mg by mouth at bedtime.     sucralfate (CARAFATE) 1 g tablet Take 1 g by mouth with breakfast, with lunch, and with evening meal.     Vitamin E 400 units TABS Take 400 Units by mouth daily.     No current facility-administered medications for this encounter.    Physical Exam: BP 114/70   Pulse 97   Ht 5\' 4"  (1.626 m)   Wt 92.1 kg   SpO2 94%   BMI 34.84 kg/m   GEN: Well nourished, well developed in no acute distress NECK: No JVD; No carotid bruits CARDIAC: Irregularly irregular rate and rhythm, no murmurs, rubs, gallops RESPIRATORY:  Left lung fields clear to auscultation, coarse crackles R lung ABDOMEN: Soft, non-tender, non-distended EXTREMITIES:  Trace edema L lower extremity, No deformity   Wt Readings from Last 3 Encounters:  01/24/24 92.1 kg  01/15/24 93.4 kg  12/27/23 93.4 kg     EKG today demonstrates  Afib Vent. rate 97 BPM PR interval * ms QRS duration 100 ms QT/QTcB 364/462 ms  Echo 01/17/24 demonstrated   1. Left ventricular ejection fraction, by estimation, is 55%. The left  ventricle has normal function. Left ventricular endocardial border not  optimally defined to evaluate regional wall motion. Left ventricular  diastolic parameters are indeterminate.   2. Right ventricular systolic function is normal.  The right ventricular  size is not well visualized. There is mildly elevated pulmonary artery  systolic pressure. The estimated right ventricular systolic pressure is  37.6 mmHg.   3. Left atrial size was severely dilated.   4. Right atrial size was mildly dilated.   5. The mitral valve is normal in structure. Trivial mitral valve  regurgitation. No evidence of mitral stenosis. Moderate mitral annular  calcification.   6. The aortic valve is tricuspid. There is mild calcification of the  aortic valve. Aortic valve regurgitation is not visualized. No aortic  stenosis is present.   7. Aortic dilatation noted. There is mild dilatation of the aortic root,  measuring 39 mm. There is mild dilatation of the ascending aorta,  measuring 39 mm.   8. The inferior  vena cava is normal in size with greater than 50%  respiratory variability, suggesting right atrial pressure of 3 mmHg.   9. The patient was in atrial fibrillation.    CHA2DS2-VASc Score = 5  The patient's score is based upon: CHF History: 1 HTN History: 1 Diabetes History: 0 Stroke History: 0 Vascular Disease History: 0 Age Score: 2 Gender Score: 1       ASSESSMENT AND PLAN: Persistent Atrial Fibrillation (ICD10:  I48.19) The patient's CHA2DS2-VASc score is 5, indicating a 7.2% annual risk of stroke.   S/p DCCV 01/21/24 with quick return of afib. Patient symptomatic with generalized weakness and tachypalpitations. We discussed rhythm control options today. Will increase amiodarone to 200 mg BID x 2 weeks then schedule repeat DCCV to allow amiodarone to load longer. If she has an acute worsening of her symptoms, may need to move up DCCV or present to the ED.  Continue diltiazem 240 mg daily Continue Eliquis 5 mg BID Continue labetalol 200 mg BID  Secondary Hypercoagulable State (ICD10:  D68.69) The patient is at significant risk for stroke/thromboembolism based upon her CHA2DS2-VASc Score of 5.  Continue Apixaban (Eliquis). No  bleeding issues.   Chronic HFpEF EF 55% GDMT per primary cardiology team Her weight is down from admission. She denies SOB today.   HTN Stable on current regimen  CAP Hospitalized with h/o same, just discharged 3/5. Crackles still present in R lung. Patient plans to follow up with PCP soon for evaluation.     Follow up with Micah Flesher as scheduled and then in the AF clinic post DCCV.    Informed Consent   Shared Decision Making/Informed Consent The risks (stroke, cardiac arrhythmias rarely resulting in the need for a temporary or permanent pacemaker, skin irritation or burns and complications associated with conscious sedation including aspiration, arrhythmia, respiratory failure and death), benefits (restoration of normal sinus rhythm) and alternatives of a direct current cardioversion were explained in detail to Ms. Chaudoin and she agrees to proceed.        Jorja Loa PA-C [facility] [address] [place], [place] [address] [phone]

## 2024-01-24 NOTE — Progress Notes (Signed)
 Primary Care Physician: Hadley Pen, MD Primary Cardiologist: Maisie Fus, MD Electrophysiologist: None  Referring Physician: ED   [name] is a 89 y.o. female with a history of HLD, HTN, TIA, , COPD, CHF, atrial fibrillation who presents for follow up in the [facility]. Patient presented to ER on 01/15/24 for 3 days of SOB and chest discomfort. Felt to have viral CAP. She was also acute fluid overloaded. Underwent R thoracentesis, 700 mL. She was previously on amiodarone but this had been discontinued 08/2023 as she was maintaining SR. She had recurrent afib and amiodarone was restarted 12/27/23. During her admission, her amiodarone was increased to 400 mg BID and she underwent DCCV on 01/21/24. Patient is on Eliquis for stroke prevention.   Patient presents today for follow up for atrial fibrillation and amiodarone monitoring. She reports that she went back into afib 01/23/24 with elevated heart rates and feeling unwell. She describes her symptoms as a generalized weakness and palpitations. She denies SOB currently. No chest pain. No bleeding issues on anticoagulation.   Today, she denies symptoms of chest pain, shortness of breath, orthopnea, PND, lower extremity edema, dizziness, presyncope, syncope, snoring, daytime somnolence, bleeding, or neurologic sequela. The patient is tolerating medications without difficulties and is otherwise without complaint today.    Atrial Fibrillation Risk Factors:  she does not have symptoms or diagnosis of sleep apnea. she does not have a history of rheumatic fever.   Atrial Fibrillation Management history:  Previous antiarrhythmic drugs: amiodarone  Previous cardioversions: 01/21/24 Previous ablations: none Anticoagulation history: Eliquis  ROS- All systems are reviewed and negative except as per the HPI above.  Past Medical History:  Diagnosis Date   Arthritis    Atrial fibrillation, transient (HCC)     post op hip surgery; May 2021   Closed fracture of right fibula and tibia 01/13/2023   Diverticulitis    Dyspnea    with exertion    GERD (gastroesophageal reflux disease)    Hyperlipidemia    Hypertension    Stroke (HCC)    hx opf mini strokes    TIA (transient ischemic attack)     Current Outpatient Medications  Medication Sig Dispense Refill   acetaminophen (TYLENOL) 500 MG tablet Take 2 tablets (1,000 mg total) by mouth every 8 (eight) hours. (Patient taking differently: Take 1,000 mg by mouth every 8 (eight) hours as needed for mild pain (pain score 1-3), moderate pain (pain score 4-6) or headache.) 30 tablet 0   albuterol (VENTOLIN HFA) 108 (90 Base) MCG/ACT inhaler Inhale 2 puffs into the lungs every 4 (four) hours as needed for wheezing or shortness of breath.     amiodarone (PACERONE) 200 MG tablet Take 1 tablet (200 mg total) by mouth daily. 90 tablet 3   Ascorbic Acid (VITAMIN C) 1000 MG tablet Take 1,000 mg by mouth daily.     cetirizine (ZYRTEC) 10 MG tablet Take 10 mg by mouth daily.      Cholecalciferol (VITAMIN D3) 125 MCG (5000 UT) TABS Take 5,000 Units by mouth daily.     cyanocobalamin 1000 MCG tablet Take 1,000 mcg by mouth every other day. Vitamin B12     diclofenac Sodium (VOLTAREN) 1 % GEL Apply 2 g topically daily as needed (pain).     diltiazem (CARDIZEM CD) 240 MG 24 hr capsule Take 1 capsule (240 mg total) by mouth daily. 30 capsule 1   ELIQUIS 5 MG TABS tablet TAKE 1 TABLET BY  MOUTH TWICE A DAY 180 tablet 1   furosemide (LASIX) 40 MG tablet Take 1 tablet (40 mg total) by mouth daily. 90 tablet 3   gabapentin (NEURONTIN) 300 MG capsule Take 300-600 mg by mouth See admin instructions. Take one capsule (300 mg) in the morning and two capsules (600 mg) in the evening.     ipratropium-albuterol (DUONEB) 0.5-2.5 (3) MG/3ML SOLN Take 3 mLs by nebulization every 6 (six) hours as needed (shortness of breath). 360 mL 0   labetalol (NORMODYNE) 200 MG tablet Take 200 mg by  mouth 2 (two) times daily.     methocarbamol (ROBAXIN) 750 MG tablet Take 750-1,500 mg by mouth See admin instructions. Take 750 mg in the morning and 1500 mg in the PM     mirtazapine (REMERON) 15 MG tablet Take 15 mg by mouth at bedtime.     Multiple Vitamin (MULTIVITAMIN WITH MINERALS) TABS tablet Take 1 tablet by mouth daily. Fish oil     Multiple Vitamins-Minerals (PRESERVISION AREDS 2 PO) Take 1 tablet by mouth 2 (two) times daily. Vision Shield     Omega-3 Fatty Acids (OMEGA-3 FISH OIL PO) Take 1 capsule by mouth daily.     potassium chloride SA (KLOR-CON M20) 20 MEQ tablet Take 1 tablet by mouth daily.     pravastatin (PRAVACHOL) 20 MG tablet Take 20 mg by mouth at bedtime.     sucralfate (CARAFATE) 1 g tablet Take 1 g by mouth with breakfast, with lunch, and with evening meal.     Vitamin E 400 units TABS Take 400 Units by mouth daily.     No current facility-administered medications for this encounter.    Physical Exam: BP 114/70   Pulse 97   Ht 5\' 4"  (1.626 m)   Wt 92.1 kg   SpO2 94%   BMI 34.84 kg/m   GEN: Well nourished, well developed in no acute distress NECK: No JVD; No carotid bruits CARDIAC: Irregularly irregular rate and rhythm, no murmurs, rubs, gallops RESPIRATORY:  Left lung fields clear to auscultation, coarse crackles R lung ABDOMEN: Soft, non-tender, non-distended EXTREMITIES:  Trace edema L lower extremity, No deformity   Wt Readings from Last 3 Encounters:  01/24/24 92.1 kg  01/15/24 93.4 kg  12/27/23 93.4 kg     EKG today demonstrates  Afib Vent. rate 97 BPM PR interval * ms QRS duration 100 ms QT/QTcB 364/462 ms  Echo 01/17/24 demonstrated   1. Left ventricular ejection fraction, by estimation, is 55%. The left  ventricle has normal function. Left ventricular endocardial border not  optimally defined to evaluate regional wall motion. Left ventricular  diastolic parameters are indeterminate.   2. Right ventricular systolic function is normal.  The right ventricular  size is not well visualized. There is mildly elevated pulmonary artery  systolic pressure. The estimated right ventricular systolic pressure is  37.6 mmHg.   3. Left atrial size was severely dilated.   4. Right atrial size was mildly dilated.   5. The mitral valve is normal in structure. Trivial mitral valve  regurgitation. No evidence of mitral stenosis. Moderate mitral annular  calcification.   6. The aortic valve is tricuspid. There is mild calcification of the  aortic valve. Aortic valve regurgitation is not visualized. No aortic  stenosis is present.   7. Aortic dilatation noted. There is mild dilatation of the aortic root,  measuring 39 mm. There is mild dilatation of the ascending aorta,  measuring 39 mm.   8. The inferior  vena cava is normal in size with greater than 50%  respiratory variability, suggesting right atrial pressure of 3 mmHg.   9. The patient was in atrial fibrillation.    CHA2DS2-VASc Score = 5  The patient's score is based upon: CHF History: 1 HTN History: 1 Diabetes History: 0 Stroke History: 0 Vascular Disease History: 0 Age Score: 2 Gender Score: 1       ASSESSMENT AND PLAN: Persistent Atrial Fibrillation (ICD10:  I48.19) The patient's CHA2DS2-VASc score is 5, indicating a 7.2% annual risk of stroke.   S/p DCCV 01/21/24 with quick return of afib. Patient symptomatic with generalized weakness and tachypalpitations. We discussed rhythm control options today. Will increase amiodarone to 200 mg BID x 2 weeks then schedule repeat DCCV to allow amiodarone to load longer. If she has an acute worsening of her symptoms, may need to move up DCCV or present to the ED.  Continue diltiazem 240 mg daily Continue Eliquis 5 mg BID Continue labetalol 200 mg BID  Secondary Hypercoagulable State (ICD10:  D68.69) The patient is at significant risk for stroke/thromboembolism based upon her CHA2DS2-VASc Score of 5.  Continue Apixaban (Eliquis). No  bleeding issues.   Chronic HFpEF EF 55% GDMT per primary cardiology team Her weight is down from admission. She denies SOB today.   HTN Stable on current regimen  CAP Hospitalized with h/o same, just discharged 3/5. Crackles still present in R lung. Patient plans to follow up with PCP soon for evaluation.     Follow up with Micah Flesher as scheduled and then in the AF clinic post DCCV.    Informed Consent   Shared Decision Making/Informed Consent The risks (stroke, cardiac arrhythmias rarely resulting in the need for a temporary or permanent pacemaker, skin irritation or burns and complications associated with conscious sedation including aspiration, arrhythmia, respiratory failure and death), benefits (restoration of normal sinus rhythm) and alternatives of a direct current cardioversion were explained in detail to Ms. Chaudoin and she agrees to proceed.        Jorja Loa PA-C [facility] [address] [place], [place] [address] [phone]

## 2024-01-24 NOTE — Telephone Encounter (Signed)
 Ok, if symptoms progress,she will need to go to the hospital  M. Branch,MD 12/27/23

## 2024-01-28 ENCOUNTER — Telehealth: Payer: Self-pay | Admitting: Internal Medicine

## 2024-01-28 MED ORDER — POTASSIUM CHLORIDE CRYS ER 20 MEQ PO TBCR: 40.0000 meq | EXTENDED_RELEASE_TABLET | Freq: Every day | ORAL | 5 refills | Status: DC

## 2024-01-28 MED ORDER — FUROSEMIDE 80 MG PO TABS: 80.0000 mg | ORAL_TABLET | Freq: Every day | ORAL | 3 refills | Status: DC

## 2024-01-28 NOTE — Telephone Encounter (Signed)
 Patient identification verified by 2 forms. Marilynn Rail, RN    Called and spoke to patients daughter Dennie Bible  RN offered Dennie Bible DOD OV with Dr. Allyson Sabal on 3/14 Pat states:   -she spoke to someone from Afib clinic and they agree with recommendations from this morning   -she will apply medication changes and see if there are any improvements   -she does not wish to bring patient in for OV at this time   -she will follow up as needed  -does not need additional prescription, her husband is a doctor and will review   -she will keep 3/27 OV with NP Cleaver for follow up  RN encouraged Pat to monitor symptoms, if SOB does not improve or worsen, present to ED  Pat verbalized understanding, no questions at this time

## 2024-01-28 NOTE — Telephone Encounter (Signed)
 Pt c/o swelling: STAT is pt has developed SOB within 24 hours  How much weight have you gained and in what time span?   If swelling, where is the swelling located? Some on feet and legs, but mostly in lungs   Are you currently taking a fluid pill? Yes   Are you currently SOB? Yes - over 24 hrs   Do you have a log of your daily weights (if so, list)? N/A   Have you gained 3 pounds in a day or 5 pounds in a week? N/A   Have you traveled recently?   No     700 cc of fluid removed at the hospital. Pt had another chest xray yesterday and it is showing the same amount of fluid, if not more, after just being discharged from hospital. They were advised to reach out to cardiologist to see about getting patient seen as soon as possible. Preferring to see MD.

## 2024-01-28 NOTE — Telephone Encounter (Signed)
 Patient identification verified by 2 forms. Katelyn Rail, RN    Called and spoke to patients daughter [name] states:   -patient has worsening SOB   -recently seen by PCP, pleural effusion worsening and has fluid in lungs   -PCP recommended cardiology follow up   -Patient has OV 3/18, would like patient seen sooner   -Patient takes Furosemide 40mg  daily  Informed pat der DOD Dr. Royann Shivers:   -increase lasix to 80mg  daily and potassium to daily   -if breathing worsens or does not improve present to ED  [name] did not agree with DOD Plan  [name] stated:   -patient needs to be seen in person, additional imaging   -feels like her mothers care is not being prioritized   -does not want to bring patient to ED due to long wait time   -would like more to be done   -unsure how they will know lasix is working   -her and her husband will be out of town next week, unable to keep 3/18 OV Informed Pat:   -At this time not OV available this week   -RN can reschedule OV per request for after cardioversion on 3/25  -sign of Lasix working would be improvement in patients breathing   -If breathing does not improve or worsen present to ED for evaluation  Per Pat's request OV rescheduled for 3/27 Encouraged Pat to monitor symptoms at home, if breathing does not improve or if worsens present to ED with evaluation  [name] Stated "you better hope nothing happens to my mother"  [name] stated she verbalized understanding of Rx adjustment, does not need new Rx sent to pharmacy

## 2024-02-04 ENCOUNTER — Ambulatory Visit: Admitting: Physician Assistant

## 2024-02-10 NOTE — Progress Notes (Deleted)
 Cardiology Clinic Note   Patient Name: [name] Date of Encounter: 02/10/2024  Primary Care Provider:  Hadley Pen, MD Primary Cardiologist:  Maisie Fus, MD  Patient Profile    [name] 89 year old female presents to the clinic today for follow-up evaluation of her atrial fibrillation.  Past Medical History    Past Medical History:  Diagnosis Date   Arthritis    Atrial fibrillation, transient (HCC)    post op hip surgery; May 2021   Closed fracture of right fibula and tibia 01/13/2023   Diverticulitis    Dyspnea    with exertion    GERD (gastroesophageal reflux disease)    Hyperlipidemia    Hypertension    Stroke (HCC)    hx opf mini strokes    TIA (transient ischemic attack)    Past Surgical History:  Procedure Laterality Date   CARDIOVERSION N/A 01/21/2024   Procedure: CARDIOVERSION;  Surgeon: Tessa Lerner, DO;  Location: [facility];  Service: Cardiovascular;  Laterality: N/A;   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/13/2020   Procedure: CONVERSION TO LEFT TOTAL HIP-POSTERIOR APPROACH;  Surgeon: Durene Romans, MD;  Location: [facility];  Service: Orthopedics;  Laterality: Left;  90 mins   HIP FRACTURE SURGERY Left 04/16/2020   TIBIA IM NAIL INSERTION Right 01/15/2023   Procedure: INTRAMEDULLARY (IM) NAIL  RIGHT TIBIAL;  Surgeon: Myrene Galas, MD;  Location: [facility] OR;  Service: Orthopedics;  Laterality: Right;    Allergies  Allergies  Allergen Reactions   Keflex [Cephalexin] Other (See Comments)    "makes pt feel really bad"  Tolerated Cephalosporin Date: 01/15/23.     Morphine Anxiety and Other (See Comments)    Pt states this made her feel like she was going to die    History of Present Illness    [name] has a PMH of hyperlipidemia, hypertension, TIA, COPD, CHF, and atrial fibrillation.  EF 55%  She was seen in follow-up by A-fib clinic on 01/24/2024.  During that time she was presenting for amiodarone monitoring.  She noted that  she had gone back into atrial fibrillation on 01/23/2024.  She noted elevated heart rates and felt unwell.  She noted palpitations and general weakness.  She denied shortness of breath.  She denied chest pain and bleeding issues.  She reported compliance on anticoagulation.  CHA2DS2-VASc score 5.  She underwent DCCV 01/21/2024 and quickly returned to atrial fibrillation.  Her amiodarone was increased to 200 mg twice daily for 2 weeks with plan to repeat DCCV.  She was continued on labetalol, apixaban, and diltiazem.  She was admitted to the hospital on 02/11/2024 for repeat DCCV.***  She presents to the clinic today for follow-up evaluation and states***.  *** denies chest pain, shortness of breath, lower extremity edema, fatigue, palpitations, melena, hematuria, hemoptysis, diaphoresis, weakness, presyncope, syncope, orthopnea, and PND.  Atrial fibrillation-EKG today shows***.  Underwent DCCV 01/21/2024 and quickly returned to atrial fibrillation.  Underwent amiodarone load and had repeat DCCV on 02/11/2024. Avoid triggers caffeine, chocolate, EtOH, dehydration etc. Continue*** Maintain p.o. hydration Follow-up with A-fib clinic as scheduled  HFpEF-euvolemic today.  Weight stable.  Reports improved breathing. Heart healthy low-sodium diet Continue furosemide, labetalol  Essential hypertension-BP today***. Low-sodium diet Maintain blood pressure log Continue labetalol, diltiazem, furosemide  Hyperlipidemia-LDL***. High-fiber diet Continue pravastatin  Disposition: Follow-up with Dr. Jacques Navy or me in 3-4 months.  Follow-up with A-fib clinic as scheduled.  Home Medications    Prior to Admission medications  Medication Sig Start Date End Date Taking? Authorizing Provider  acetaminophen (TYLENOL) 500 MG tablet Take 2 tablets (1,000 mg total) by mouth every 8 (eight) hours. Patient taking differently: Take 1,000 mg by mouth every 8 (eight) hours as needed for mild pain (pain score 1-3),  moderate pain (pain score 4-6) or headache. 09/16/20   Lanney Gins, PA-C  albuterol (VENTOLIN HFA) 108 (90 Base) MCG/ACT inhaler Inhale 2 puffs into the lungs every 4 (four) hours as needed for wheezing or shortness of breath. 06/08/21   [provider]  amiodarone (PACERONE) 200 MG tablet Take 1 tablet (200 mg total) by mouth 2 (two) times daily for 14 days, THEN 1 tablet (200 mg total) daily. 01/24/24 02/06/25  Fenton, Clint R, PA  Ascorbic Acid (VITAMIN C) 1000 MG tablet Take 1,000 mg by mouth daily.    [provider]  cetirizine (ZYRTEC) 10 MG tablet Take 10 mg by mouth daily.     [provider]  Cholecalciferol (VITAMIN D3) 50 MCG (2000 UT) TABS Take 2,000 Units by mouth daily.    [provider]  cyanocobalamin 1000 MCG tablet Take 1,000 mcg by mouth every other day. Vitamin B12    [provider]  diclofenac Sodium (VOLTAREN) 1 % GEL Apply 2 g topically daily as needed (pain).    [provider]  DILT-XR 240 MG 24 hr capsule Take 240 mg by mouth daily. 01/27/24   [provider]  ELIQUIS 5 MG TABS tablet TAKE 1 TABLET BY MOUTH TWICE A DAY 07/16/22   Allred, Fayrene Fearing, MD  ferrous sulfate 325 (65 FE) MG tablet Take 325 mg by mouth daily with breakfast.    [provider]  furosemide (LASIX) 80 MG tablet Take 1 tablet (80 mg total) by mouth daily. 01/28/24 04/27/24  Croitoru, Mihai, MD  gabapentin (NEURONTIN) 300 MG capsule Take 300-600 mg by mouth See admin instructions. Take one capsule (300 mg) in the morning and two capsules (600 mg) in the evening.    [provider]  hydroxypropyl methylcellulose / hypromellose (ISOPTO TEARS / GONIOVISC) 2.5 % ophthalmic solution Place 1 drop into both eyes daily as needed for dry eyes.    [provider]  ipratropium-albuterol (DUONEB) 0.5-2.5 (3) MG/3ML SOLN Take 3 mLs by nebulization every 6 (six) hours as needed (shortness of breath). 12/04/22   Burnadette Pop, MD   labetalol (NORMODYNE) 200 MG tablet Take 200 mg by mouth 2 (two) times daily.    [provider]  methocarbamol (ROBAXIN) 750 MG tablet Take 750-1,500 mg by mouth See admin instructions. Take 750 mg in the morning and 1500 mg in the PM 03/06/22   [provider]  mirtazapine (REMERON) 15 MG tablet Take 15 mg by mouth at bedtime. 02/27/22   [provider]  Multiple Vitamin (MULTIVITAMIN WITH MINERALS) TABS tablet Take 2 tablets by mouth daily. Fish oil (gummy)    [provider]  Multiple Vitamins-Minerals (PRESERVISION AREDS 2 PO) Take 1 tablet by mouth 2 (two) times daily. [employer], Historical, MD  Omega-3 Fatty Acids (OMEGA-3 FISH OIL PO) Take 1,200 mg by mouth daily.    [provider]  pantoprazole (PROTONIX) 40 MG tablet Take 40 mg by mouth daily.    [provider]  potassium chloride SA (KLOR-CON M20) 20 MEQ tablet Take 2 tablets (40 mEq total) by mouth daily. Patient taking differently: Take 20 mEq by mouth 2 (two) times daily. 01/28/24   Croitoru, Milan,  MD  pravastatin (PRAVACHOL) 20 MG tablet Take 20 mg by mouth at bedtime.    [provider]  sucralfate (CARAFATE) 1 g tablet Take 1 g by mouth with breakfast, with lunch, and with evening meal.    [provider]    Family History    Family History  Problem Relation Age of Onset   Diabetes Mother    She indicated that the status of her mother is unknown.  Social History    Social History   Socioeconomic History   Marital status: Widowed    Spouse name: Not on file   Number of children: Not on file   Years of education: Not on file   Highest education level: Not on file  Occupational History   Not on file  Tobacco Use   Smoking status: Never   Smokeless tobacco: Never   Tobacco comments:    Never smoked 01/24/24  Substance and Sexual Activity   Alcohol use: Never   Drug use: Never   Sexual activity: Not on file  Other Topics  Concern   Not on file  Social History Narrative   Lives in [place] but currently staying with daughter in [place].   homemaker   Social Drivers of Health   Financial Resource Strain: Not on file  Food Insecurity: No Food Insecurity (01/16/2024)   Hunger Vital Sign    Worried About Running Out of Food in the Last Year: Never true    Ran Out of Food in the Last Year: Never true  Transportation Needs: No Transportation Needs (01/16/2024)   PRAPARE - Administrator, Civil Service (Medical): No    Lack of Transportation (Non-Medical): No  Physical Activity: Not on file  Stress: Not on file  Social Connections: Socially Isolated (01/16/2024)   Social Connection and Isolation Panel [NHANES]    Frequency of Communication with Friends and Family: More than three times a week    Frequency of Social Gatherings with Friends and Family: Never    Attends Religious Services: Never    Database administrator or Organizations: No    Attends Banker Meetings: Never    Marital Status: Widowed  Intimate Partner Violence: Not At Risk (01/16/2024)   Humiliation, Afraid, Rape, and Kick questionnaire    Fear of Current or Ex-Partner: No    Emotionally Abused: No    Physically Abused: No    Sexually Abused: No     Review of Systems    General:  No chills, fever, night sweats or weight changes.  Cardiovascular:  No chest pain, dyspnea on exertion, edema, orthopnea, palpitations, paroxysmal nocturnal dyspnea. Dermatological: No rash, lesions/masses Respiratory: No cough, dyspnea Urologic: No hematuria, dysuria Abdominal:   No nausea, vomiting, diarrhea, bright red blood per rectum, melena, or hematemesis Neurologic:  No visual changes, wkns, changes in mental status. All other systems reviewed and are otherwise negative except as noted above.  Physical Exam    VS:  There were no vitals taken for this visit. , BMI There is no height or weight on file to calculate BMI. GEN:  Well nourished, well developed, in no acute distress. HEENT: normal. Neck: Supple, no JVD, carotid bruits, or masses. Cardiac: RRR, no murmurs, rubs, or gallops. No clubbing, cyanosis, edema.  Radials/DP/PT 2+ and equal bilaterally.  Respiratory:  Respirations regular and unlabored, clear to auscultation bilaterally. GI: Soft, nontender, nondistended, BS + x 4. MS: no deformity or atrophy. Skin: warm and dry, no rash. Neuro:  Strength and sensation are intact. Psych: Normal affect.  Accessory Clinical Findings    Recent Labs: 12/27/2023: TSH 1.360 01/15/2024: B Natriuretic Peptide 202.5 01/20/2024: ALT 20 01/21/2024: Hemoglobin 11.4; Magnesium 2.0; Platelets 236 01/22/2024: BUN 18; Creatinine, Ser 0.85; Potassium 4.9; Sodium 137   Recent Lipid Panel    Component Value Date/Time   CHOL 211 (H) 07/08/2020 1130   TRIG 198 (H) 07/08/2020 1130   HDL 38 (L) 07/08/2020 1130   CHOLHDL 5.6 (H) 07/08/2020 1130   LDLCALC 137 (H) 07/08/2020 1130    No BP recorded.  {Refresh Note OR Click here to enter BP  :1}***    ECG personally reviewed by me today- ***    Echocardiogram 01/17/2024  IMPRESSIONS     1. Left ventricular ejection fraction, by estimation, is 55%. The left  ventricle has normal function. Left ventricular endocardial border not  optimally defined to evaluate regional wall motion. Left ventricular  diastolic parameters are indeterminate.   2. Right ventricular systolic function is normal. The right ventricular  size is not well visualized. There is mildly elevated pulmonary artery  systolic pressure. The estimated right ventricular systolic pressure is  37.6 mmHg.   3. Left atrial size was severely dilated.   4. Right atrial size was mildly dilated.   5. The mitral valve is normal in structure. Trivial mitral valve  regurgitation. No evidence of mitral stenosis. Moderate mitral annular  calcification.   6. The aortic valve is tricuspid. There is mild calcification of the   aortic valve. Aortic valve regurgitation is not visualized. No aortic  stenosis is present.   7. Aortic dilatation noted. There is mild dilatation of the aortic root,  measuring 39 mm. There is mild dilatation of the ascending aorta,  measuring 39 mm.   8. The inferior vena cava is normal in size with greater than 50%  respiratory variability, suggesting right atrial pressure of 3 mmHg.   9. The patient was in atrial fibrillation.   FINDINGS   Left Ventricle: Left ventricular ejection fraction, by estimation, is  55%. The left ventricle has normal function. Left ventricular endocardial  border not optimally defined to evaluate regional wall motion. The left  ventricular internal cavity size was  normal in size. There is no left ventricular hypertrophy. Left ventricular  diastolic parameters are indeterminate.   Right Ventricle: The right ventricular size is not well visualized. Right  vetricular wall thickness was not well visualized. Right ventricular  systolic function is normal. There is mildly elevated pulmonary artery  systolic pressure. The tricuspid  regurgitant velocity is 2.94 m/s, and with an assumed right atrial  pressure of 3 mmHg, the estimated right ventricular systolic pressure is  37.6 mmHg.   Left Atrium: Left atrial size was severely dilated.   Right Atrium: Right atrial size was mildly dilated.   Pericardium: There is no evidence of pericardial effusion.   Mitral Valve: The mitral valve is normal in structure. Moderate mitral  annular calcification. Trivial mitral valve regurgitation. No evidence of  mitral valve stenosis. MV peak gradient, 3.9 mmHg. The mean mitral valve  gradient is 2.0 mmHg.   Tricuspid Valve: The tricuspid valve is normal in structure. Tricuspid  valve regurgitation is mild.   Aortic Valve: The aortic valve is tricuspid. There is mild calcification  of the aortic valve. Aortic valve regurgitation is not visualized. No  aortic stenosis  is present. Aortic valve mean gradient measures 2.7 mmHg.  Aortic valve peak gradient measures  4.1 mmHg.  Aortic valve area, by VTI measures 2.13 cm.   Pulmonic Valve: The pulmonic valve was normal in structure. Pulmonic valve  regurgitation is not visualized.   Aorta: Aortic dilatation noted. There is mild dilatation of the aortic  root, measuring 39 mm. There is mild dilatation of the ascending aorta,  measuring 39 mm.   Venous: The inferior vena cava is normal in size with greater than 50%  respiratory variability, suggesting right atrial pressure of 3 mmHg.   IAS/Shunts: No atrial level shunt detected by color flow Doppler.       Assessment & Plan   1.  ***   Thomasene Ripple. Lenoard Helbert NP-C     02/10/2024, 7:41 PM [facility] [address] Office [phone] Fax [phone]    I spent***minutes examining this patient, reviewing medications, and using patient centered shared decision making involving their cardiac care.   I spent  20 minutes reviewing past medical history,  medications, and prior cardiac tests.

## 2024-02-10 NOTE — Progress Notes (Signed)
 Spoke to pt's daughter Dennie Bible and instructed them to come at 0630 and to be NPO after 0000.  Confirmed no missed doses of AC and instructed to take in AM with a small sip of water.  Confirmed that pt will have a ride home and someone to stay with them for 24 hours after the procedure. Instructed patient to not wear any jewelry or lotion.

## 2024-02-11 ENCOUNTER — Ambulatory Visit (HOSPITAL_COMMUNITY): Payer: Self-pay

## 2024-02-11 ENCOUNTER — Telehealth: Payer: Self-pay | Admitting: Cardiology

## 2024-02-11 ENCOUNTER — Ambulatory Visit (HOSPITAL_BASED_OUTPATIENT_CLINIC_OR_DEPARTMENT_OTHER): Payer: Self-pay

## 2024-02-11 ENCOUNTER — Encounter (HOSPITAL_COMMUNITY): Payer: Self-pay | Admitting: Cardiology

## 2024-02-11 ENCOUNTER — Encounter (HOSPITAL_COMMUNITY)
Admission: RE | Disposition: A | Discharge status: Home / Self Care | Payer: Self-pay | Source: Home / Self Care | Attending: Cardiology

## 2024-02-11 ENCOUNTER — Ambulatory Visit (HOSPITAL_COMMUNITY)
Admission: RE | Admit: 2024-02-11 | Discharge: 2024-02-11 | Disposition: A | Discharge status: Home / Self Care | Attending: Cardiology | Admitting: Cardiology

## 2024-02-11 DIAGNOSIS — J449 Chronic obstructive pulmonary disease, unspecified: Secondary | ICD-10-CM | POA: Diagnosis not present

## 2024-02-11 DIAGNOSIS — I5033 Acute on chronic diastolic (congestive) heart failure: Secondary | ICD-10-CM

## 2024-02-11 DIAGNOSIS — Z7901 Long term (current) use of anticoagulants: Secondary | ICD-10-CM | POA: Insufficient documentation

## 2024-02-11 DIAGNOSIS — I4891 Unspecified atrial fibrillation: Secondary | ICD-10-CM

## 2024-02-11 DIAGNOSIS — I110 Hypertensive heart disease with heart failure: Secondary | ICD-10-CM | POA: Insufficient documentation

## 2024-02-11 DIAGNOSIS — I4819 Other persistent atrial fibrillation: Secondary | ICD-10-CM | POA: Diagnosis present

## 2024-02-11 DIAGNOSIS — M1990 Unspecified osteoarthritis, unspecified site: Secondary | ICD-10-CM | POA: Insufficient documentation

## 2024-02-11 DIAGNOSIS — D6869 Other thrombophilia: Secondary | ICD-10-CM | POA: Diagnosis not present

## 2024-02-11 DIAGNOSIS — I5032 Chronic diastolic (congestive) heart failure: Secondary | ICD-10-CM | POA: Insufficient documentation

## 2024-02-11 DIAGNOSIS — Z79899 Other long term (current) drug therapy: Secondary | ICD-10-CM | POA: Diagnosis not present

## 2024-02-11 SURGERY — CARDIOVERSION (CATH LAB)
Anesthesia: General

## 2024-02-11 MED ORDER — IPRATROPIUM-ALBUTEROL 0.5-2.5 (3) MG/3ML IN SOLN
3.0000 mL | Freq: Once | RESPIRATORY_TRACT | Status: AC
  Administered 2024-02-11: 3 mL via RESPIRATORY_TRACT

## 2024-02-11 MED ORDER — SODIUM CHLORIDE 0.9% FLUSH: 3.0000 mL | Freq: Two times a day (BID) | INTRAVENOUS | Status: DC

## 2024-02-11 MED ORDER — SODIUM CHLORIDE 0.9% FLUSH
3.0000 mL | INTRAVENOUS | Status: DC | PRN
  Administered 2024-02-11: 4 mL via INTRAVENOUS

## 2024-02-11 MED ORDER — LIDOCAINE 2% (20 MG/ML) 5 ML SYRINGE
INTRAMUSCULAR | Status: DC | PRN
  Administered 2024-02-11: 40 mg via INTRAVENOUS

## 2024-02-11 MED ORDER — PROPOFOL 10 MG/ML IV BOLUS
INTRAVENOUS | Status: DC | PRN
Start: 2024-02-11 — End: 2024-02-11
  Administered 2024-02-11: 60 mg via INTRAVENOUS

## 2024-02-11 MED ORDER — IPRATROPIUM-ALBUTEROL 0.5-2.5 (3) MG/3ML IN SOLN
RESPIRATORY_TRACT | Status: DC
Start: 2024-02-11 — End: 2024-02-11
  Filled 2024-02-11: qty 3

## 2024-02-11 SURGICAL SUPPLY — 1 items: PAD DEFIB RADIO PHYSIO CONN (PAD) ×1 IMPLANT

## 2024-02-11 NOTE — Anesthesia Postprocedure Evaluation (Signed)
 Anesthesia Post Note  Patient: [name]  Procedure(s) Performed: CARDIOVERSION     Patient location during evaluation: PACU Anesthesia Type: General Level of consciousness: awake and alert Pain management: pain level controlled Vital Signs Assessment: post-procedure vital signs reviewed and stable Respiratory status: spontaneous breathing, nonlabored ventilation, respiratory function stable and patient connected to nasal cannula oxygen Cardiovascular status: blood pressure returned to baseline and stable Postop Assessment: no apparent nausea or vomiting Anesthetic complications: no  No notable events documented.  Last Vitals:  Vitals:   02/11/24 0910 02/11/24 0920  BP: 125/73 122/65  Pulse: 61 61  Resp: (!) 25 (!) 21  Temp:    SpO2: 93% 93%    Last Pain:  Vitals:   02/11/24 0850  TempSrc:   PainSc: 0-No pain                 Shelton Silvas

## 2024-02-11 NOTE — Telephone Encounter (Signed)
 Patient identification verified by 2 forms. Marilynn Rail, RN    Called and spoke to patients daughter Herby Abraham states:   -patient completed cardioversion this morning   -patient unable to present to 3/27 OV   -she is questioning if patient needs OV with NL or should follow up with afib clinic only   -would like patient to be assigned to Dr. Bjorn Pippin  Informed Pat:   -follow up with NL is for [facility] follow up that was initially rescheduled   -since patient last TOC rescheduled and she had cardioversion today best to reschedule with office   -Afib clinic is different from primary cardiologist office   -message sent to Dr. Bjorn Pippin to confirm if patient can be added to panel Pat agrees with scheduling for 4/1 at 8:25am  Dennie Bible has no further questions at this time

## 2024-02-11 NOTE — Interval H&P Note (Signed)
 History and Physical Interval Note:  02/11/2024 7:44 AM  [name]  has presented today for surgery, with the diagnosis of AFIB.  The various methods of treatment have been discussed with the patient and family. After consideration of risks, benefits and other options for treatment, the patient has consented to  Procedure(s): CARDIOVERSION (N/A) as a surgical intervention.  The patient's history has been reviewed, patient examined, no change in status, stable for surgery.  I have reviewed the patient's chart and labs.  Questions were answered to the patient's satisfaction.     [employer]

## 2024-02-11 NOTE — Discharge Instructions (Signed)

## 2024-02-11 NOTE — Telephone Encounter (Signed)
 Yes that is ok Thayer Ohm

## 2024-02-11 NOTE — Telephone Encounter (Signed)
 Pt daughter called in stating they cannot make appt on Thursday. She states they want to r/s with Dr. Bjorn Pippin, informed her his next available is 6/26. She did not want to wait that long, I offered sooner with APP, she declined and asked to speak with the nurse regarding why does pt needs two appts here and 4/2 with afib clinic. Please advise.

## 2024-02-11 NOTE — Anesthesia Procedure Notes (Signed)
 Procedure Name: General with mask airway Date/Time: 02/11/2024 8:44 AM  Performed by: Garfield Cornea, CRNAPre-anesthesia Checklist: Patient identified, Emergency Drugs available, Suction available and Patient being monitored Patient Re-evaluated:Patient Re-evaluated prior to induction Oxygen Delivery Method: Ambu bag Preoxygenation: Pre-oxygenation with 100% oxygen Induction Type: IV induction Dental Injury: Teeth and Oropharynx as per pre-operative assessment

## 2024-02-11 NOTE — Transfer of Care (Signed)
 Immediate Anesthesia Transfer of Care Note  Patient: [name]  Procedure(s) Performed: CARDIOVERSION  Patient Location: Cath Lab  Anesthesia Type:General  Level of Consciousness: drowsy  Airway & Oxygen Therapy: Patient Spontanous Breathing and Patient connected to nasal cannula oxygen  Post-op Assessment: Report given to RN and Post -op Vital signs reviewed and stable  Post vital signs: Reviewed and stable  Last Vitals:  Vitals Value Taken Time  BP 124/69   Temp    Pulse 101 02/11/24 0834  Resp 31 02/11/24 0834  SpO2 90 % 02/11/24 0834  Vitals shown include unfiled device data.  Last Pain:  Vitals:   02/11/24 0708  TempSrc: Temporal         Complications: No notable events documented.

## 2024-02-11 NOTE — Anesthesia Preprocedure Evaluation (Addendum)
 Anesthesia Evaluation  Patient identified by MRN, date of birth, ID band Patient awake    Reviewed: Allergy & Precautions, NPO status , Patient's Chart, lab work & pertinent test results  Airway Mallampati: III  TM Distance: >3 FB Neck ROM: Full    Dental  (+) Teeth Intact   Pulmonary COPD    + wheezing      Cardiovascular hypertension, +CHF  + dysrhythmias Atrial Fibrillation  Rhythm:Irregular Rate:Abnormal  Echo:  1. Left ventricular ejection fraction, by estimation, is 55%. The left  ventricle has normal function. Left ventricular endocardial border not  optimally defined to evaluate regional wall motion. Left ventricular  diastolic parameters are indeterminate.   2. Right ventricular systolic function is normal. The right ventricular  size is not well visualized. There is mildly elevated pulmonary artery  systolic pressure. The estimated right ventricular systolic pressure is  37.6 mmHg.   3. Left atrial size was severely dilated.   4. Right atrial size was mildly dilated.   5. The mitral valve is normal in structure. Trivial mitral valve  regurgitation. No evidence of mitral stenosis. Moderate mitral annular  calcification.   6. The aortic valve is tricuspid. There is mild calcification of the  aortic valve. Aortic valve regurgitation is not visualized. No aortic  stenosis is present.   7. Aortic dilatation noted. There is mild dilatation of the aortic root,  measuring 39 mm. There is mild dilatation of the ascending aorta,  measuring 39 mm.   8. The inferior vena cava is normal in size with greater than 50%  respiratory variability, suggesting right atrial pressure of 3 mmHg.   9. The patient was in atrial fibrillation.     Neuro/Psych TIACVA    GI/Hepatic Neg liver ROS,GERD  ,,  Endo/Other  negative endocrine ROS    Renal/GU negative Renal ROS  negative genitourinary   Musculoskeletal  (+) Arthritis ,     Abdominal   Peds negative pediatric ROS (+)  Hematology negative hematology ROS (+)   Anesthesia Other Findings Wheeze improved with breathing tx  Reproductive/Obstetrics                             Anesthesia Physical Anesthesia Plan  ASA: 3  Anesthesia Plan: General   Post-op Pain Management: Minimal or no pain anticipated   Induction: Intravenous  PONV Risk Score and Plan: 0  Airway Management Planned: Natural Airway and Nasal Cannula  Additional Equipment: None  Intra-op Plan:   Post-operative Plan:   Informed Consent: I have reviewed the patients History and Physical, chart, labs and discussed the procedure including the risks, benefits and alternatives for the proposed anesthesia with the patient or authorized representative who has indicated his/her understanding and acceptance.       Plan Discussed with: CRNA  Anesthesia Plan Comments:        Anesthesia Quick Evaluation

## 2024-02-11 NOTE — CV Procedure (Signed)
    Electrical Cardioversion Procedure Note [name] [id number] 06/03/1933  Procedure: Electrical Cardioversion Indications:  Atrial Fibrillation  Time Out: Verified patient identification, verified procedure,medications/allergies/relevent history reviewed, required imaging and test results available.  Performed. Was given albuterol neb prior due to wheeze. Improved prior to procedure.   Procedure Details  The patient was NPO after midnight. Anesthesia was administered at the beside  by Dr.Hollis with propofol.  Cardioversion was performed with synchronized biphasic defibrillation via AP pads with 200 joules.  1 attempt(s) were performed.  The patient converted to normal sinus rhythm. The patient tolerated the procedure well   IMPRESSION:  Successful cardioversion of atrial fibrillation    [name] 02/11/2024, 8:52 AM

## 2024-02-13 ENCOUNTER — Ambulatory Visit: Admitting: General Practice

## 2024-02-14 ENCOUNTER — Ambulatory Visit (HOSPITAL_COMMUNITY): Admitting: Physician Assistant

## 2024-02-16 NOTE — Progress Notes (Unsigned)
 Cardiology Office Note    Date:  02/18/2024  ID:  Aloha Bartok, DOB 07/22/1933, MRN [id number] PCP:  Hadley Pen, MD  Cardiologist:  Maisie Fus, MD  Electrophysiologist:  None   Chief Complaint: Hospital follow up for diastolic HF   History of Present Illness: .    Ansleigh Graves is a 89 y.o. female with visit-pertinent history of hypertension, prior TIA/stroke, hyperlipidemia, GERD, paroxysmal A-fib, diastolic heart failure, COPD, obesity.  In 11/2022 patient was hospitalized for respiratory failure due to flu and pneumonia, course complicated by A-fib with RVR.  Patient was started on amiodarone and subsequent conversion, continued on diltiazem 240 and labetalol 200 mg twice daily and Eliquis at time of discharge.  She followed up at A-fib clinic on 12/18/2022 and was back in atrial fibrillation, advised to continue amiodarone and labetalol and diltiazem.  She was offered DCCV by Dr. Wyline Mood however her son preferred medical therapy.  She was seen in follow-up in 12/2022 for preop for right tib/fib fracture, patient was in sinus rhythm on the above regimen at that time.  In 08/2023 amiodarone was stopped at office visit as she remained in sinus.  On 11/25/2023 patient saw Dr. Wyline Mood for ongoing shortness of breath, heart palpitations since 10/2023 with on and off A-fib.  At office visit she was in A-fib, was arranged for DCCV but this was canceled on 11/28/2023 that she was back in sinus rhythm.  Patient was seen in clinic on 12/27/2023, she is back in A-fib and was symptomatic also had some PND and orthopnea.  Patient was reloaded on amiodarone and started on Lasix 40 mg daily for CHF.  On 01/15/2024 patient presented the ER with 3 days of shortness of breath and chest discomfort.  CT chest on 01/16/2024 showed small to moderate-sized right sided pleural effusion, dependent right basilar atelectasis and patchy airspace opacity within the more superior aspect of the right lower lobe.  There was  interlobular septal thickening and patchy groundglass attenuation throughout the right upper lobe.  Findings could represent a combination of asymmetric edema with atelectasis and pneumonia.  She is admitted to hospitalist for, felt to to viral cause.  She underwent right thoracentesis on 01/19/2024 with 700 mL fluid removed.  She was felt volume overloaded and given IV Lasix for CHF.  She was noted to be in A-fib RVR at admission, was given Cardizem drip and repeated amiodarone load. Echo on 01/17/2024 showed LVEF 55%, not optimal for wall motion assessment, intermediate diastolic, normal RV, PASP 37.6 MHD, severe LAE, mild RAE, trivial MR, moderate MAC, mild dilation of the aortic root measuring 39 mm.  There is mild dilation of the ascending aorta measuring 39 mm.  IVC was normal.  On 01/21/2024 she underwent successful DCCV.  On 01/24/2024 patient was seen by Alphonzo Severance, PA with A-fib clinic, unfortunately patient had a quick return to atrial fibrillation.  On 02/11/2024 patient underwent successful cardioversion, patient converted following 1 synchronized biphasic defibrillation via AP pads with 200 J.  Today patient presents for follow-up.  She reports that she return to atrial fibrillation this past Thursday, notes compliance with Eliquis.  Reports that since converting back to atrial fibrillation she has had increased fatigue and palpitations.  She notes that she does become more short of breath with exertion when in atrial fibrillation.  Notes today she has been having some problems with nausea after eating breakfast, notes just did not sit well with her.  She denies increased lower extremity  edema, reports that weights have been stable.  She is followed up with her PCP regarding her pleural effusion.  Patient's son-in-law presents with her today, notes that last week they started her on Macrobid for a possible UTI. ROS: .   Today she denies chest pain, lower extremity edema, melena, hematuria, hemoptysis,  diaphoresis, weakness, presyncope, syncope, orthopnea, and PND.  All other systems are reviewed and otherwise negative. Studies Reviewed: Marland Kitchen    EKG:  EKG is ordered today, personally reviewed, demonstrating  EKG Interpretation Date/Time:  Tuesday February 18 2024 08:26:33 EDT Ventricular Rate:  111 PR Interval:    QRS Duration:  106 QT Interval:  344 QTC Calculation: 467 R Axis:   -21  Text Interpretation: Atrial fibrillation Low voltage QRS Incomplete left bundle branch block Atrial fibrillation has repalced sinus rhythm Confirmed by Reather Littler [phone]) on 02/18/2024 5:20:28 PM   CV Studies: Cardiac studies reviewed are outlined and summarized above. Otherwise please see EMR for full report. Cardiac Studies & Procedures   ______________________________________________________________________________________________     ECHOCARDIOGRAM  ECHOCARDIOGRAM COMPLETE 01/17/2024  Narrative ECHOCARDIOGRAM REPORT    Patient Name:   PHAEDRA COLGATE Date of Exam: 01/17/2024 Medical Rec #:  [id number]      Height:       64.0 in Accession #:    [id number]     Weight:       205.9 lb Date of Birth:  1933/08/07      BSA:          1.981 m Patient Age:    89 years       BP:           107/71 mmHg Patient Gender: F              HR:           77 bpm. Exam Location:  Inpatient  Procedure: 2D Echo, Cardiac Doppler and Color Doppler (Both Spectral and Color Flow Doppler were utilized during procedure).  Indications:    CHF-acute diastolic  History:        Patient has prior history of Echocardiogram examinations, most recent 09/27/2022. COPD, Arrythmias:Atrial Fibrillation, Signs/Symptoms:Dyspnea; Risk Factors:Hypertension and Dyslipidemia.  Sonographer:    Vern Claude Referring Phys: [id number] ERIC CHEN  IMPRESSIONS   1. Left ventricular ejection fraction, by estimation, is 55%. The left ventricle has normal function. Left ventricular endocardial border not optimally defined to evaluate regional wall  motion. Left ventricular diastolic parameters are indeterminate. 2. Right ventricular systolic function is normal. The right ventricular size is not well visualized. There is mildly elevated pulmonary artery systolic pressure. The estimated right ventricular systolic pressure is 37.6 mmHg. 3. Left atrial size was severely dilated. 4. Right atrial size was mildly dilated. 5. The mitral valve is normal in structure. Trivial mitral valve regurgitation. No evidence of mitral stenosis. Moderate mitral annular calcification. 6. The aortic valve is tricuspid. There is mild calcification of the aortic valve. Aortic valve regurgitation is not visualized. No aortic stenosis is present. 7. Aortic dilatation noted. There is mild dilatation of the aortic root, measuring 39 mm. There is mild dilatation of the ascending aorta, measuring 39 mm. 8. The inferior vena cava is normal in size with greater than 50% respiratory variability, suggesting right atrial pressure of 3 mmHg. 9. The patient was in atrial fibrillation.  FINDINGS Left Ventricle: Left ventricular ejection fraction, by estimation, is 55%. The left ventricle has normal function. Left ventricular endocardial border not optimally defined to evaluate regional wall  motion. The left ventricular internal cavity size was normal in size. There is no left ventricular hypertrophy. Left ventricular diastolic parameters are indeterminate.  Right Ventricle: The right ventricular size is not well visualized. Right vetricular wall thickness was not well visualized. Right ventricular systolic function is normal. There is mildly elevated pulmonary artery systolic pressure. The tricuspid regurgitant velocity is 2.94 m/s, and with an assumed right atrial pressure of 3 mmHg, the estimated right ventricular systolic pressure is 37.6 mmHg.  Left Atrium: Left atrial size was severely dilated.  Right Atrium: Right atrial size was mildly dilated.  Pericardium: There is no  evidence of pericardial effusion.  Mitral Valve: The mitral valve is normal in structure. Moderate mitral annular calcification. Trivial mitral valve regurgitation. No evidence of mitral valve stenosis. MV peak gradient, 3.9 mmHg. The mean mitral valve gradient is 2.0 mmHg.  Tricuspid Valve: The tricuspid valve is normal in structure. Tricuspid valve regurgitation is mild.  Aortic Valve: The aortic valve is tricuspid. There is mild calcification of the aortic valve. Aortic valve regurgitation is not visualized. No aortic stenosis is present. Aortic valve mean gradient measures 2.7 mmHg. Aortic valve peak gradient measures 4.1 mmHg. Aortic valve area, by VTI measures 2.13 cm.  Pulmonic Valve: The pulmonic valve was normal in structure. Pulmonic valve regurgitation is not visualized.  Aorta: Aortic dilatation noted. There is mild dilatation of the aortic root, measuring 39 mm. There is mild dilatation of the ascending aorta, measuring 39 mm.  Venous: The inferior vena cava is normal in size with greater than 50% respiratory variability, suggesting right atrial pressure of 3 mmHg.  IAS/Shunts: No atrial level shunt detected by color flow Doppler.   LEFT VENTRICLE PLAX 2D LVIDd:         5.20 cm LVIDs:         3.60 cm LV PW:         0.80 cm LV IVS:        0.80 cm LVOT diam:     2.00 cm LV SV:         35 LV SV Index:   18 LVOT Area:     3.14 cm  LV Volumes (MOD) LV vol d, MOD A2C: 90.8 ml LV vol d, MOD A4C: 110.0 ml LV vol s, MOD A2C: 39.2 ml LV vol s, MOD A4C: 46.0 ml LV SV MOD A2C:     51.6 ml LV SV MOD A4C:     110.0 ml LV SV MOD BP:      60.8 ml  RIGHT VENTRICLE             IVC RV Basal diam:  3.40 cm     IVC diam: 1.60 cm RV Mid diam:    2.00 cm RV S prime:     11.10 cm/s  LEFT ATRIUM              Index        RIGHT ATRIUM           Index LA diam:        5.40 cm  2.73 cm/m   RA Area:     17.80 cm LA Vol (A2C):   114.0 ml 57.55 ml/m  RA Volume:   46.50 ml  23.48  ml/m LA Vol (A4C):   97.2 ml  49.07 ml/m LA Biplane Vol: 108.0 ml 54.53 ml/m AORTIC VALVE  PULMONIC VALVE AV Area (Vmax):    1.90 cm     PV Vmax:       0.90 m/s AV Area (Vmean):   1.87 cm     PV Peak grad:  3.2 mmHg AV Area (VTI):     2.13 cm AV Vmax:           101.10 cm/s AV Vmean:          72.133 cm/s AV VTI:            0.163 m AV Peak Grad:      4.1 mmHg AV Mean Grad:      2.7 mmHg LVOT Vmax:         61.25 cm/s LVOT Vmean:        42.850 cm/s LVOT VTI:          0.110 m LVOT/AV VTI ratio: 0.68  AORTA Ao Root diam: 3.90 cm Ao Asc diam:  3.90 cm  MITRAL VALVE               TRICUSPID VALVE MV Area (PHT): 4.68 cm    TR Peak grad:   34.6 mmHg MV Area VTI:   3.83 cm    TR Vmax:        294.00 cm/s MV Peak grad:  3.9 mmHg MV Mean grad:  2.0 mmHg    SHUNTS MV Vmax:       0.98 m/s    Systemic VTI:  0.11 m MV Vmean:      57.2 cm/s   Systemic Diam: 2.00 cm MV Decel Time: 162 msec MV E velocity: 90.00 cm/s  Dalton McleanMD Electronically signed by Wilfred Lacy Signature Date/Time: 01/17/2024/10:55:46 AM    Final    MONITORS  CARDIAC EVENT MONITOR 09/15/2020  Narrative Sinus rhythm Nocturnal sinus bradycardia No afib No sustained arrhythmias Baseline artifact limits interpretation at times       ______________________________________________________________________________________________       Current Reported Medications:.    Current Meds  Medication Sig   acetaminophen (TYLENOL) 500 MG tablet Take 2 tablets (1,000 mg total) by mouth every 8 (eight) hours. (Patient taking differently: Take 1,000 mg by mouth every 8 (eight) hours as needed for mild pain (pain score 1-3), moderate pain (pain score 4-6) or headache.)   albuterol (VENTOLIN HFA) 108 (90 Base) MCG/ACT inhaler Inhale 2 puffs into the lungs every 4 (four) hours as needed for wheezing or shortness of breath.   amiodarone (PACERONE) 200 MG tablet Take 1 tablet (200 mg total) by  mouth 2 (two) times daily for 14 days, THEN 1 tablet (200 mg total) daily.   Ascorbic Acid (VITAMIN C) 1000 MG tablet Take 1,000 mg by mouth daily.   cetirizine (ZYRTEC) 10 MG tablet Take 10 mg by mouth daily.    Cholecalciferol (VITAMIN D3) 50 MCG (2000 UT) TABS Take 2,000 Units by mouth daily.   cyanocobalamin 1000 MCG tablet Take 1,000 mcg by mouth every other day. Vitamin B12   diclofenac Sodium (VOLTAREN) 1 % GEL Apply 2 g topically daily as needed (pain).   DILT-XR 240 MG 24 hr capsule Take 240 mg by mouth daily.   ELIQUIS 5 MG TABS tablet TAKE 1 TABLET BY MOUTH TWICE A DAY   ferrous sulfate 325 (65 FE) MG tablet Take 325 mg by mouth daily with breakfast.   furosemide (LASIX) 80 MG tablet Take 1 tablet (80 mg total) by mouth daily.   gabapentin (NEURONTIN) 300 MG capsule Take 300-600 mg by  mouth See admin instructions. Take one capsule (300 mg) in the morning and two capsules (600 mg) in the evening.   hydroxypropyl methylcellulose / hypromellose (ISOPTO TEARS / GONIOVISC) 2.5 % ophthalmic solution Place 1 drop into both eyes daily as needed for dry eyes.   ipratropium-albuterol (DUONEB) 0.5-2.5 (3) MG/3ML SOLN Take 3 mLs by nebulization every 6 (six) hours as needed (shortness of breath).   labetalol (NORMODYNE) 200 MG tablet Take 200 mg by mouth 2 (two) times daily.   methocarbamol (ROBAXIN) 750 MG tablet Take 750-1,500 mg by mouth See admin instructions. Take 750 mg in the morning and 1500 mg in the PM   mirtazapine (REMERON) 15 MG tablet Take 15 mg by mouth at bedtime.   Multiple Vitamin (MULTIVITAMIN WITH MINERALS) TABS tablet Take 2 tablets by mouth daily. Fish oil (gummy)   Multiple Vitamins-Minerals (PRESERVISION AREDS 2 PO) Take 1 tablet by mouth 2 (two) times daily. Vision Shield   Omega-3 Fatty Acids (OMEGA-3 FISH OIL PO) Take 1,200 mg by mouth daily.   pantoprazole (PROTONIX) 40 MG tablet Take 40 mg by mouth daily.   potassium chloride SA (KLOR-CON M20) 20 MEQ tablet Take 2  tablets (40 mEq total) by mouth daily. (Patient taking differently: Take 20 mEq by mouth 2 (two) times daily.)   pravastatin (PRAVACHOL) 20 MG tablet Take 20 mg by mouth at bedtime.   sucralfate (CARAFATE) 1 g tablet Take 1 g by mouth with breakfast, with lunch, and with evening meal.   Physical Exam:    VS:  BP 116/62 (BP Location: Left Arm, Patient Position: Sitting, Cuff Size: Normal)   Pulse (!) 111   Ht 5\' 3"  (1.6 m)   Wt 198 lb 9.6 oz (90.1 kg)   SpO2 93%   BMI 35.18 kg/m    Wt Readings from Last 3 Encounters:  02/18/24 198 lb 9.6 oz (90.1 kg)  02/11/24 199 lb 3.2 oz (90.4 kg)  01/24/24 203 lb (92.1 kg)    GEN: Well nourished, well developed in no acute distress NECK: No JVD; No carotid bruits CARDIAC: Irregular RR, no murmurs, rubs, gallops RESPIRATORY: Left lung field clear to auscultation, crackles right lung base ABDOMEN: Soft, non-tender, non-distended EXTREMITIES:  No edema; No acute deformity     Asessement and Plan:.    Persistent afib RVR: Patient now followed by A-fib clinic.  Has had problems with persistent A-fib.  Underwent DCCV on 01/21/2024 following an amiodarone load while inpatient, unfortunately after discharge had quick return to A-fib.  Underwent cardioversion after another amiodarone load on 02/11/2024. EKG today indicates atrial fibrillation at 111 bpm.  Patient son notes she return to A-fib on Thursday of last week, has been adherent with Eliquis. Patients heart rate decreased to 92 bpm on recheck.  Patient son in law notes that she was started on Macrobid last week for possible UTI.  Patient has follow-up with A-fib clinic tomorrow, to discuss future plans.  Patient denies any bleeding problems.  Continue Eliquis 5 mg twice daily, amiodarone, diltiazem 240 mg daily, labetalol 200 mg twice daily and diltiazem 30 mg as needed.  Reviewed ED precautions. Check CBC, c-Met, magnesium and TSH today. CHA2DS2-VASc Score = 5 [CHF History: 1, HTN History: 1, Diabetes  History: 0, Stroke History: 0, Vascular Disease History: 0, Age Score: 2, Gender Score: 1].  Therefore, the patient's annual risk of stroke is 7.2 %.      Chronic HFpEF: Patient admitted in 12/2023 with acute exacerbation of diastolic heart failure and  recent pneumonia. Echo on 01/17/2024 showed LVEF 55%, not optimal for wall motion assessment, intermediate diastolic, normal RV, PASP 37.6 MHD, severe LAE, mild RAE, trivial MR, moderate MAC, mild dilation of the aortic root measuring 39 mm.  There was mild dilation of the ascending aorta measuring 39 mm.  IVC was normal.  Questioned of acute exacerbation was related to tachycardia with A-fib RVR. Underwent thoracentesis on 01/19/2024 with 700 mL fluid removed.  She was then felt to be volume overloaded and given IV Lasix.  Patient's son-in-law notes that her PCP did repeat a chest x-ray recently, noted some slight progression and pleural effusion, discussed repeating x-ray today, patient declined.  Overall patient appears euvolemic on exam today, she does have some crackles at right lower lung.  She notes that her lower extremity edema has been well-controlled.  Reports weights have been stable at home.  Check CBC, CMET, Mag and TSH.   Hypertension: Blood pressure today 116/62, continue current antihypertensive regimen.   Disposition: F/u with Afib clinic and with Dr. Bjorn Pippin in 2-3 months or sooner if needed.   Signed, Rip Harbour, NP

## 2024-02-17 ENCOUNTER — Encounter (HOSPITAL_BASED_OUTPATIENT_CLINIC_OR_DEPARTMENT_OTHER): Payer: Self-pay

## 2024-02-18 ENCOUNTER — Ambulatory Visit: Attending: Cardiology | Admitting: Cardiology

## 2024-02-18 ENCOUNTER — Encounter: Payer: Self-pay | Admitting: Cardiology

## 2024-02-18 VITALS — BP 116/62 | HR 111 | Ht 63.0 in | Wt 198.6 lb

## 2024-02-18 DIAGNOSIS — I4819 Other persistent atrial fibrillation: Secondary | ICD-10-CM | POA: Diagnosis not present

## 2024-02-18 DIAGNOSIS — D6869 Other thrombophilia: Secondary | ICD-10-CM | POA: Insufficient documentation

## 2024-02-18 DIAGNOSIS — I5032 Chronic diastolic (congestive) heart failure: Secondary | ICD-10-CM | POA: Diagnosis present

## 2024-02-18 DIAGNOSIS — I10 Essential (primary) hypertension: Secondary | ICD-10-CM | POA: Insufficient documentation

## 2024-02-18 LAB — CBC

## 2024-02-18 NOTE — Patient Instructions (Addendum)
 Medication Instructions:  No Changes  Lab Work: CBC, CMET, Magnesium, TSH (Thyroid Stimulating Hormone) Today   Follow-Up: At [facility], you and your health needs are our priority.  As part of our continuing mission to provide you with exceptional heart care, our providers are all part of one team.  This team includes your primary Cardiologist (physician) and Advanced Practice Providers or APPs (Physician Assistants and Nurse Practitioners) who all work together to provide you with the care you need, when you need it.  Your next appointment:   You have an appointment at the Afib Clinic on 02/19/2024 at 11:00 am with Alphonzo Severance, PA, then at [facility] on  05/11/2024 at 2:20 pm with Dr. Bjorn Pippin.  Provider:   Epifanio Lesches, MD    Other Instructions Please Call us [phone]) or send a MyChart message if you need anything Cardiology related before your follow up appointment.

## 2024-02-19 ENCOUNTER — Ambulatory Visit (HOSPITAL_COMMUNITY)
Admission: RE | Admit: 2024-02-19 | Discharge: 2024-02-19 | Disposition: A | Discharge status: Home / Self Care | Source: Ambulatory Visit | Attending: Physician Assistant | Admitting: Physician Assistant

## 2024-02-19 ENCOUNTER — Encounter (HOSPITAL_COMMUNITY): Payer: Self-pay | Admitting: Physician Assistant

## 2024-02-19 VITALS — BP 116/90 | HR 112 | Ht 63.0 in | Wt 198.6 lb

## 2024-02-19 DIAGNOSIS — Z5181 Encounter for therapeutic drug level monitoring: Secondary | ICD-10-CM | POA: Diagnosis not present

## 2024-02-19 DIAGNOSIS — Z8673 Personal history of transient ischemic attack (TIA), and cerebral infarction without residual deficits: Secondary | ICD-10-CM | POA: Diagnosis not present

## 2024-02-19 DIAGNOSIS — I5032 Chronic diastolic (congestive) heart failure: Secondary | ICD-10-CM | POA: Diagnosis not present

## 2024-02-19 DIAGNOSIS — J449 Chronic obstructive pulmonary disease, unspecified: Secondary | ICD-10-CM | POA: Diagnosis not present

## 2024-02-19 DIAGNOSIS — Z7901 Long term (current) use of anticoagulants: Secondary | ICD-10-CM | POA: Insufficient documentation

## 2024-02-19 DIAGNOSIS — I110 Hypertensive heart disease with heart failure: Secondary | ICD-10-CM | POA: Insufficient documentation

## 2024-02-19 DIAGNOSIS — D6869 Other thrombophilia: Secondary | ICD-10-CM | POA: Insufficient documentation

## 2024-02-19 DIAGNOSIS — E785 Hyperlipidemia, unspecified: Secondary | ICD-10-CM | POA: Insufficient documentation

## 2024-02-19 DIAGNOSIS — Z79899 Other long term (current) drug therapy: Secondary | ICD-10-CM | POA: Diagnosis not present

## 2024-02-19 DIAGNOSIS — I4819 Other persistent atrial fibrillation: Secondary | ICD-10-CM | POA: Insufficient documentation

## 2024-02-19 LAB — CBC
Hematocrit: 38.9 % (ref 34.0–46.6)
Hemoglobin: 12.4 g/dL (ref 11.1–15.9)
MCH: 28.2 pg (ref 26.6–33.0)
MCHC: 31.9 g/dL (ref 31.5–35.7)
MCV: 88 fL (ref 79–97)
Platelets: 265 10*3/uL (ref 150–450)
RBC: 4.4 x10E6/uL (ref 3.77–5.28)
RDW: 13.4 % (ref 11.7–15.4)
WBC: 8.4 10*3/uL (ref 3.4–10.8)

## 2024-02-19 LAB — COMPREHENSIVE METABOLIC PANEL WITH GFR
ALT: 12 IU/L (ref 0–32)
AST: 14 IU/L (ref 0–40)
Albumin: 4.1 g/dL (ref 3.7–4.7)
Alkaline Phosphatase: 87 IU/L (ref 44–121)
BUN/Creatinine Ratio: 18 (ref 12–28)
BUN: 17 mg/dL (ref 8–27)
Bilirubin Total: 0.3 mg/dL (ref 0.0–1.2)
CO2: 24 mmol/L (ref 20–29)
Calcium: 9.5 mg/dL (ref 8.7–10.3)
Chloride: 102 mmol/L (ref 96–106)
Creatinine, Ser: 0.95 mg/dL (ref 0.57–1.00)
Globulin, Total: 2 g/dL (ref 1.5–4.5)
Glucose: 105 mg/dL — ABNORMAL HIGH (ref 70–99)
Potassium: 3.7 mmol/L (ref 3.5–5.2)
Sodium: 144 mmol/L (ref 134–144)
Total Protein: 6.1 g/dL (ref 6.0–8.5)
eGFR: 58 mL/min/{1.73_m2} — ABNORMAL LOW (ref >59)

## 2024-02-19 LAB — TSH: TSH: 2.76 u[IU]/mL (ref 0.450–4.500)

## 2024-02-19 LAB — MAGNESIUM: Magnesium: 1.7 mg/dL (ref 1.6–2.3)

## 2024-02-19 MED ORDER — DILTIAZEM HCL ER COATED BEADS 300 MG PO CP24: 300.0000 mg | ORAL_CAPSULE | Freq: Every day | ORAL | 5 refills | Status: DC

## 2024-02-19 MED ORDER — AMIODARONE HCL 200 MG PO TABS: 200.0000 mg | ORAL_TABLET | Freq: Every day | ORAL | 3 refills | Status: DC

## 2024-02-19 MED ORDER — POTASSIUM CHLORIDE CRYS ER 20 MEQ PO TBCR
20.0000 meq | EXTENDED_RELEASE_TABLET | Freq: Three times a day (TID) | ORAL | 3 refills | Status: AC
Start: 2024-02-19 — End: ?

## 2024-02-19 NOTE — Progress Notes (Signed)
 Primary Care Physician: Hadley Pen, MD Primary Cardiologist: Little Ishikawa, MD Electrophysiologist: None  Referring Physician: ED   [name] is a 89 y.o. female with a history of HLD, HTN, TIA, , COPD, CHF, atrial fibrillation who presents for follow up in the [facility]. Patient presented to ER on 01/15/24 for 3 days of SOB and chest discomfort. Felt to have viral CAP. She was also acute fluid overloaded. Underwent R thoracentesis, 700 mL. She was previously on amiodarone but this had been discontinued 08/2023 as she was maintaining SR. She had recurrent afib and amiodarone was restarted 12/27/23. During her admission, her amiodarone was increased to 400 mg BID and she underwent DCCV on 01/21/24. Patient is on Eliquis for stroke prevention. She reports that she went back into afib 01/23/24 with elevated heart rates and feeling unwell. She described her symptoms as a generalized weakness and palpitations. Her amiodarone was loaded for an additional two weeks and had another DCCV on 02/11/24. She was back in afib before her cardiology visit 02/18/24.  Patient returns for follow up for atrial fibrillation and amiodarone monitoring. She remains in afib with elevated rates. She did feel improved right after DCCV with less SOB. Over the past two weeks her weight has trended down and her edema has improved.   Today, she  denies symptoms of palpitations, chest pain, orthopnea, PND, lower extremity edema, dizziness, presyncope, syncope, snoring, daytime somnolence, bleeding, or neurologic sequela. The patient is tolerating medications without difficulties and is otherwise without complaint today.    Atrial Fibrillation Risk Factors:  she does not have symptoms or diagnosis of sleep apnea. she does not have a history of rheumatic fever.   Atrial Fibrillation Management history:  Previous antiarrhythmic drugs: amiodarone  Previous cardioversions:  01/21/24 Previous ablations: none Anticoagulation history: Eliquis  ROS- All systems are reviewed and negative except as per the HPI above.  Past Medical History:  Diagnosis Date   Arthritis    Atrial fibrillation, transient (HCC)    post op hip surgery; May 2021   Closed fracture of right fibula and tibia 01/13/2023   Diverticulitis    Dyspnea    with exertion    GERD (gastroesophageal reflux disease)    Hyperlipidemia    Hypertension    Stroke (HCC)    hx opf mini strokes    TIA (transient ischemic attack)     Current Outpatient Medications  Medication Sig Dispense Refill   acetaminophen (TYLENOL) 500 MG tablet Take 2 tablets (1,000 mg total) by mouth every 8 (eight) hours. (Patient taking differently: Take 1,000 mg by mouth every 8 (eight) hours as needed for mild pain (pain score 1-3), moderate pain (pain score 4-6) or headache.) 30 tablet 0   albuterol (VENTOLIN HFA) 108 (90 Base) MCG/ACT inhaler Inhale 2 puffs into the lungs every 4 (four) hours as needed for wheezing or shortness of breath.     amiodarone (PACERONE) 200 MG tablet Take 1 tablet (200 mg total) by mouth 2 (two) times daily for 14 days, THEN 1 tablet (200 mg total) daily. 90 tablet 3   Ascorbic Acid (VITAMIN C) 1000 MG tablet Take 1,000 mg by mouth daily.     cetirizine (ZYRTEC) 10 MG tablet Take 10 mg by mouth daily.      Cholecalciferol (VITAMIN D3) 50 MCG (2000 UT) TABS Take 2,000 Units by mouth daily.     cyanocobalamin 1000 MCG tablet Take 1,000 mcg by mouth every other day.  Vitamin B12     diclofenac Sodium (VOLTAREN) 1 % GEL Apply 2 g topically daily as needed (pain).     DILT-XR 240 MG 24 hr capsule Take 240 mg by mouth daily.     ELIQUIS 5 MG TABS tablet TAKE 1 TABLET BY MOUTH TWICE A DAY 180 tablet 1   ferrous sulfate 325 (65 FE) MG tablet Take 325 mg by mouth daily with breakfast.     furosemide (LASIX) 80 MG tablet Take 1 tablet (80 mg total) by mouth daily. 90 tablet 3   gabapentin (NEURONTIN) 300  MG capsule Take 300-600 mg by mouth See admin instructions. Take one capsule (300 mg) in the morning and two capsules (600 mg) in the evening.     hydroxypropyl methylcellulose / hypromellose (ISOPTO TEARS / GONIOVISC) 2.5 % ophthalmic solution Place 1 drop into both eyes daily as needed for dry eyes.     ipratropium-albuterol (DUONEB) 0.5-2.5 (3) MG/3ML SOLN Take 3 mLs by nebulization every 6 (six) hours as needed (shortness of breath). 360 mL 0   labetalol (NORMODYNE) 200 MG tablet Take 200 mg by mouth 2 (two) times daily.     methocarbamol (ROBAXIN) 750 MG tablet Take 750-1,500 mg by mouth See admin instructions. Take 750 mg in the morning and 1500 mg in the PM     mirtazapine (REMERON) 15 MG tablet Take 15 mg by mouth at bedtime.     Multiple Vitamin (MULTIVITAMIN WITH MINERALS) TABS tablet Take 2 tablets by mouth daily. Fish oil (gummy)     Multiple Vitamins-Minerals (PRESERVISION AREDS 2 PO) Take 1 tablet by mouth 2 (two) times daily. Vision Shield     Omega-3 Fatty Acids (OMEGA-3 FISH OIL PO) Take 1,200 mg by mouth daily.     pantoprazole (PROTONIX) 40 MG tablet Take 40 mg by mouth daily.     potassium chloride SA (KLOR-CON M20) 20 MEQ tablet Take 2 tablets (40 mEq total) by mouth daily. (Patient taking differently: Take 20 mEq by mouth 2 (two) times daily.) 60 tablet 5   pravastatin (PRAVACHOL) 20 MG tablet Take 20 mg by mouth at bedtime.     sucralfate (CARAFATE) 1 g tablet Take 1 g by mouth with breakfast, with lunch, and with evening meal.     No current facility-administered medications for this encounter.    Physical Exam: BP (!) 116/90   Pulse (!) 112   Ht 5\' 3"  (1.6 m)   Wt 90.1 kg   SpO2 96%   BMI 35.19 kg/m   GEN: Well nourished, well developed in no acute distress CARDIAC: Irregularly irregular rate and rhythm, no murmurs, rubs, gallops RESPIRATORY:  Clear to auscultation without rales, wheezing or rhonchi  ABDOMEN: Soft, non-tender, non-distended EXTREMITIES:  trace  bilateral lower extremity edema, No deformity    Wt Readings from Last 3 Encounters:  02/19/24 90.1 kg  02/18/24 90.1 kg  02/11/24 90.4 kg     EKG is not ordered today. Reviewed ECG from 02/18/24 which showed.  Vent. rate 111 BPM PR interval * ms QRS duration 106 ms QT/QTcB 344/467 ms   Echo 01/17/24 demonstrated   1. Left ventricular ejection fraction, by estimation, is 55%. The left  ventricle has normal function. Left ventricular endocardial border not  optimally defined to evaluate regional wall motion. Left ventricular  diastolic parameters are indeterminate.   2. Right ventricular systolic function is normal. The right ventricular  size is not well visualized. There is mildly elevated pulmonary artery  systolic pressure.  The estimated right ventricular systolic pressure is  37.6 mmHg.   3. Left atrial size was severely dilated.   4. Right atrial size was mildly dilated.   5. The mitral valve is normal in structure. Trivial mitral valve  regurgitation. No evidence of mitral stenosis. Moderate mitral annular  calcification.   6. The aortic valve is tricuspid. There is mild calcification of the  aortic valve. Aortic valve regurgitation is not visualized. No aortic  stenosis is present.   7. Aortic dilatation noted. There is mild dilatation of the aortic root,  measuring 39 mm. There is mild dilatation of the ascending aorta,  measuring 39 mm.   8. The inferior vena cava is normal in size with greater than 50%  respiratory variability, suggesting right atrial pressure of 3 mmHg.   9. The patient was in atrial fibrillation.   CHA2DS2-VASc Score = 5  The patient's score is based upon: CHF History: 1 HTN History: 1 Diabetes History: 0 Stroke History: 0 Vascular Disease History: 0 Age Score: 2 Gender Score: 1       ASSESSMENT AND PLAN: Persistent Atrial Fibrillation (ICD10:  I48.19) The patient's CHA2DS2-VASc score is 5, indicating a 7.2% annual risk of stroke.   S/p  DCCV 01/21/24 with quick return of afib, reloaded on amiodarone with another DCCV on 02/11/24. Back in afib again. She did feel symptomatic improvement right after DCCV. We discussed rhythm control options including changing AAD to dofetilide which would require a 3 months washout of amiodarone, AV nodal ablation +PPM, and rate control. I don't think there is any utility doing another DCCV given her quick return of afib on amiodarone after two DCCVs. Will refer her to EP to discuss options.  Increase diltiazem to 30 mg daily Continue amiodarone 200 mg daily for rate control.  Continue labetalol 200 mg BID Continue Eliquis 5 mg BID   Secondary Hypercoagulable State (ICD10:  D68.69) The patient is at significant risk for stroke/thromboembolism based upon her CHA2DS2-VASc Score of 5.  Continue Apixaban (Eliquis). No bleeding issues.  High Risk Medication Monitoring (ICD 10: Z79.899) Intervals on ECG acceptable for amiodarone monitoring.   Chronic HFpEF EF 55% GDMT per primary cardiology team Fluid status appears stable today on higher dose of Lasix. Her K+ has decreased from 4.9 to 3.7, will increase KCL to 20 meq TID.   HTN Stable on current regimen Increase diltiazem as above.    Follow up with EP to discuss rate vs rhythm control.     Jorja Loa PA-C [facility] [address] [place], [place] [address] [phone]

## 2024-02-19 NOTE — Patient Instructions (Signed)
 Increase cardizem to 300mg  once a day    Increase potassium to three times a day

## 2024-03-09 ENCOUNTER — Encounter: Payer: Self-pay | Admitting: Cardiovascular Disease

## 2024-03-09 ENCOUNTER — Ambulatory Visit: Attending: Cardiovascular Disease | Admitting: Cardiovascular Disease

## 2024-03-09 VITALS — BP 110/74 | HR 65 | Ht 63.0 in | Wt 199.8 lb

## 2024-03-09 DIAGNOSIS — I4819 Other persistent atrial fibrillation: Secondary | ICD-10-CM | POA: Diagnosis present

## 2024-03-09 LAB — CBC
Hematocrit: 39.1 % (ref 34.0–46.6)
Hemoglobin: 12.4 g/dL (ref 11.1–15.9)
MCH: 28.4 pg (ref 26.6–33.0)
MCHC: 31.7 g/dL (ref 31.5–35.7)
MCV: 90 fL (ref 79–97)
Platelets: 245 10*3/uL (ref 150–450)
RBC: 4.37 x10E6/uL (ref 3.77–5.28)
RDW: 13.4 % (ref 11.7–15.4)
WBC: 8.7 10*3/uL (ref 3.4–10.8)

## 2024-03-09 NOTE — Patient Instructions (Signed)
 Medication Instructions:  Your physician recommends that you continue on your current medications as directed. Please refer to the Current Medication list given to you today. *If you need a refill on your cardiac medications before your next appointment, please call your pharmacy*  Lab Work: CBC and BMET - please have pre-procedure lab work completed at [facility] on the first floor of our building today  If you have labs (blood work) drawn today and your tests are completely normal, you will receive your results only by: MyChart Message (if you have MyChart) OR A paper copy in the mail If you have any lab test that is abnormal or we need to change your treatment, we will call you to review the results.  Testing/Procedures: Pacemaker Implant - scheduled for Monday, April 28 Your physician has recommended that you have a pacemaker inserted. A pacemaker is a small device that is placed under the skin of your chest or abdomen to help control abnormal heart rhythms. This device uses electrical pulses to prompt the heart to beat at a normal rate. Pacemakers are used to treat heart rhythms that are too slow. Wire (leads) are attached to the pacemaker that goes into the chambers of you heart. This is done in the hospital and usually requires and overnight stay. Please see the instruction sheet given to you today for more information.   Follow-Up: At [facility], you and your health needs are our priority.  As part of our continuing mission to provide you with exceptional heart care, our providers are all part of one team.  This team includes your primary Cardiologist (physician) and Advanced Practice Providers or APPs (Physician Assistants and Nurse Practitioners) who all work together to provide you with the care you need, when you need it.  Your next appointment:   We will schedule follow up after your pacemaker implant  Provider:   Marlane Silver, MD      1st Floor: - Lobby -  Registration  - Pharmacy  - Lab - Cafe  2nd Floor: - PV Lab - Diagnostic Testing (echo, CT, nuclear med)  3rd Floor: - Vacant  4th Floor: - TCTS (cardiothoracic surgery) - AFib Clinic - Structural Heart Clinic - Vascular Surgery  - Vascular Ultrasound  5th Floor: - HeartCare Cardiology (general and EP) - Clinical Pharmacy for coumadin, hypertension, lipid, weight-loss medications, and med management appointments    Valet parking services will be available as well.

## 2024-03-09 NOTE — Progress Notes (Signed)
 Electrophysiology Office Note:    Date:  03/09/2024   ID:  [name], DOB 05/19/1933, MRN [id number]  PCP:  Melva Stabile, MD   [facility] Providers Cardiologist:  Wendie Hamburg, MD { Click to update primary MD,subspecialty MD or APP then REFRESH:1}    Referring MD: Katelyn Gal, PA   History of Present Illness:    [name] is a 89 y.o. female with a medical history significant for atrial fibrillation, TIA, hypertension, hyperlipidemia, COPD, referred for management of atrial fibrillation.     She was diagnosed with atrial fibrillation about 3 or 4 years ago.  She was taking amiodarone  for a short period, but it was discontinued in October 2024 since she was maintaining sinus rhythm. She did well for a while. She presented to the ER in February 2025 with 3 days of chest discomfort and shortness of breath and found to have a viral pneumonia.  A-fib recurred and amiodarone  was restarted on December 27, 2023.  She ultimately underwent a cardioversion on January 21, 2024.  Unfortunately, the A-fib recurred 2 days later.  She was given additional loading dose of amiodarone  and had another DC cardioversion on March 25 but again had recurrence of atrial fibrillation.  Her diltiazem  was increased to 300 mg daily, and she has continued amiodarone  200 mg daily.  Her son-in-law, an ER physician, has been assisting in her care.  She has several cardiomyopathy tracings that show heart rates between 100-130 bpm.  During home monitoring, her heart rate is rarely below 80 bpm.  The patient reports that she continues to feel very fatigued.  She does not have significant palpitations.      Today, she is fatigued, short of breath.  She was transported to the clinic room in a wheelchair.  EKGs/Labs/Other Studies Reviewed Today:     Echocardiogram:  TTE February 2025 EF 55%.  Left atrium severely dilated.  Right atrium mildly dilated.    EKG:   EKG  Interpretation Date/Time:  Monday March 09 2024 15:47:11 EDT Ventricular Rate:  59 PR Interval:    QRS Duration:  98 QT Interval:  404 QTC Calculation: 399 R Axis:   49  Text Interpretation: Atrial flutter with variable A-V block Nonspecific T wave abnormality When compared with ECG of 18-Feb-2024 08:26, Vent. rate has decreased BY  52 BPM Questionable change in QRS axis Confirmed by [name] [phone]) on 03/09/2024 3:51:52 PM     Physical Exam:    VS:  BP 110/74 (BP Location: Left Arm, Patient Position: Sitting, Cuff Size: Normal)   Pulse 65   Ht 5\' 3"  (1.6 m)   Wt 199 lb 12.8 oz (90.6 kg)   SpO2 94%   BMI 35.39 kg/m     Wt Readings from Last 3 Encounters:  03/09/24 199 lb 12.8 oz (90.6 kg)  02/19/24 198 lb 10.2 oz (90.1 kg)  02/18/24 198 lb 9.6 oz (90.1 kg)     GEN: Well nourished, well developed in no acute distress CARDIAC: iRRR, no murmurs, rubs, gallops RESPIRATORY:  Normal work of breathing     ASSESSMENT & PLAN:     Persistent atrial fibrillation Has failed medical management with 2 cardioversions with early return of A-fib while on amiodarone  Having failed amiodarone , with relatively low atrial voltage and severely enlarged left atrium, at age 8, I do not think there are viable options for rhythm control. She is currently on amiodarone  and diltiazem  300 with moderately well-controlled rates at home  and significant fatigue on these medications I think her best option for quality of life is pacemaker placement with AVJ ablation to decrease her medication burden and achieve rate control and regularization of her rhythm Will plan for placement of a single-chamber pacemaker with a left bundle area lead followed by an AVJ ablation several weeks following.  We discussed the indication, rationale, logistics, anticipated benefits, and potential risks of the ablation procedure including but not limited to -- bleed at the groin access site, damage to the heart  including perforation.  Pacemaker placement includes additional risk of infection and localized bleeding, device malfunction such as lead fracture or displacement which could be life-threatening after AVJ ablation.  Secondary hypercoagulable state CHA2DS2-VASc score is 5 Continue Eliquis  5 mg twice daily  High risk medication -- amiodarone  I will intend to discontinue this after her AVJ ablation      Signed, Efraim Grange, MD  03/09/2024 4:43 PM    [facility]

## 2024-03-09 NOTE — H&P (View-Only) (Signed)
 Electrophysiology Office Note:    Date:  03/09/2024   ID:  [name], DOB 05/19/1933, MRN [id number]  PCP:  Melva Stabile, MD   [facility] Providers Cardiologist:  Wendie Hamburg, MD { Click to update primary MD,subspecialty MD or APP then REFRESH:1}    Referring MD: Twana Gal, PA   History of Present Illness:    Arnelle Nale is a 89 y.o. female with a medical history significant for atrial fibrillation, TIA, hypertension, hyperlipidemia, COPD, referred for management of atrial fibrillation.     She was diagnosed with atrial fibrillation about 3 or 4 years ago.  She was taking amiodarone  for a short period, but it was discontinued in October 2024 since she was maintaining sinus rhythm. She did well for a while. She presented to the ER in February 2025 with 3 days of chest discomfort and shortness of breath and found to have a viral pneumonia.  A-fib recurred and amiodarone  was restarted on December 27, 2023.  She ultimately underwent a cardioversion on January 21, 2024.  Unfortunately, the A-fib recurred 2 days later.  She was given additional loading dose of amiodarone  and had another DC cardioversion on March 25 but again had recurrence of atrial fibrillation.  Her diltiazem  was increased to 300 mg daily, and she has continued amiodarone  200 mg daily.  Her son-in-law, an ER physician, has been assisting in her care.  She has several cardiomyopathy tracings that show heart rates between 100-130 bpm.  During home monitoring, her heart rate is rarely below 80 bpm.  The patient reports that she continues to feel very fatigued.  She does not have significant palpitations.      Today, she is fatigued, short of breath.  She was transported to the clinic room in a wheelchair.  EKGs/Labs/Other Studies Reviewed Today:     Echocardiogram:  TTE February 2025 EF 55%.  Left atrium severely dilated.  Right atrium mildly dilated.    EKG:   EKG  Interpretation Date/Time:  Monday March 09 2024 15:47:11 EDT Ventricular Rate:  59 PR Interval:    QRS Duration:  98 QT Interval:  404 QTC Calculation: 399 R Axis:   49  Text Interpretation: Atrial flutter with variable A-V block Nonspecific T wave abnormality When compared with ECG of 18-Feb-2024 08:26, Vent. rate has decreased BY  52 BPM Questionable change in QRS axis Confirmed by Marlane Silver [phone]) on 03/09/2024 3:51:52 PM     Physical Exam:    VS:  BP 110/74 (BP Location: Left Arm, Patient Position: Sitting, Cuff Size: Normal)   Pulse 65   Ht 5\' 3"  (1.6 m)   Wt 199 lb 12.8 oz (90.6 kg)   SpO2 94%   BMI 35.39 kg/m     Wt Readings from Last 3 Encounters:  03/09/24 199 lb 12.8 oz (90.6 kg)  02/19/24 198 lb 10.2 oz (90.1 kg)  02/18/24 198 lb 9.6 oz (90.1 kg)     GEN: Well nourished, well developed in no acute distress CARDIAC: iRRR, no murmurs, rubs, gallops RESPIRATORY:  Normal work of breathing     ASSESSMENT & PLAN:     Persistent atrial fibrillation Has failed medical management with 2 cardioversions with early return of A-fib while on amiodarone  Having failed amiodarone , with relatively low atrial voltage and severely enlarged left atrium, at age 8, I do not think there are viable options for rhythm control. She is currently on amiodarone  and diltiazem  300 with moderately well-controlled rates at home  and significant fatigue on these medications I think her best option for quality of life is pacemaker placement with AVJ ablation to decrease her medication burden and achieve rate control and regularization of her rhythm Will plan for placement of a single-chamber pacemaker with a left bundle area lead followed by an AVJ ablation several weeks following.  We discussed the indication, rationale, logistics, anticipated benefits, and potential risks of the ablation procedure including but not limited to -- bleed at the groin access site, damage to the heart  including perforation.  Pacemaker placement includes additional risk of infection and localized bleeding, device malfunction such as lead fracture or displacement which could be life-threatening after AVJ ablation.  Secondary hypercoagulable state CHA2DS2-VASc score is 5 Continue Eliquis  5 mg twice daily  High risk medication -- amiodarone  I will intend to discontinue this after her AVJ ablation      Signed, Efraim Grange, MD  03/09/2024 4:43 PM    [facility]

## 2024-03-10 LAB — BASIC METABOLIC PANEL WITH GFR
BUN/Creatinine Ratio: 25 (ref 12–28)
BUN: 22 mg/dL (ref 8–27)
CO2: 23 mmol/L (ref 20–29)
Calcium: 9.4 mg/dL (ref 8.7–10.3)
Chloride: 104 mmol/L (ref 96–106)
Creatinine, Ser: 0.87 mg/dL (ref 0.57–1.00)
Glucose: 89 mg/dL (ref 70–99)
Potassium: 4.6 mmol/L (ref 3.5–5.2)
Sodium: 141 mmol/L (ref 134–144)
eGFR: 64 mL/min/{1.73_m2} (ref >59)

## 2024-03-13 NOTE — Pre-Procedure Instructions (Signed)
 Spoke with daughter Deatra Face.  Instructed on the following items: Arrival time 1130 Nothing to eat or drink after midnight No meds AM of procedure Responsible person to drive you home and stay with you for 24 hrs Wash with special soap night before and morning of procedure If on anti-coagulant drug instructions Eliquis - last dose 03/13/24

## 2024-03-16 ENCOUNTER — Ambulatory Visit (HOSPITAL_COMMUNITY)

## 2024-03-16 ENCOUNTER — Ambulatory Visit (HOSPITAL_COMMUNITY)
Admission: RE | Disposition: A | Discharge status: Home / Self Care | Payer: Self-pay | Source: Home / Self Care | Attending: Cardiovascular Disease

## 2024-03-16 ENCOUNTER — Ambulatory Visit (HOSPITAL_COMMUNITY)
Admission: RE | Admit: 2024-03-16 | Discharge: 2024-03-16 | Disposition: A | Discharge status: Home / Self Care | Attending: Cardiovascular Disease | Admitting: Cardiovascular Disease

## 2024-03-16 ENCOUNTER — Other Ambulatory Visit: Payer: Self-pay

## 2024-03-16 DIAGNOSIS — D6869 Other thrombophilia: Secondary | ICD-10-CM | POA: Diagnosis not present

## 2024-03-16 DIAGNOSIS — J449 Chronic obstructive pulmonary disease, unspecified: Secondary | ICD-10-CM | POA: Diagnosis not present

## 2024-03-16 DIAGNOSIS — R001 Bradycardia, unspecified: Secondary | ICD-10-CM | POA: Insufficient documentation

## 2024-03-16 DIAGNOSIS — I10 Essential (primary) hypertension: Secondary | ICD-10-CM | POA: Insufficient documentation

## 2024-03-16 DIAGNOSIS — E785 Hyperlipidemia, unspecified: Secondary | ICD-10-CM | POA: Insufficient documentation

## 2024-03-16 DIAGNOSIS — Z7901 Long term (current) use of anticoagulants: Secondary | ICD-10-CM | POA: Insufficient documentation

## 2024-03-16 DIAGNOSIS — Z79899 Other long term (current) drug therapy: Secondary | ICD-10-CM | POA: Insufficient documentation

## 2024-03-16 DIAGNOSIS — Z8673 Personal history of transient ischemic attack (TIA), and cerebral infarction without residual deficits: Secondary | ICD-10-CM | POA: Insufficient documentation

## 2024-03-16 DIAGNOSIS — I442 Atrioventricular block, complete: Secondary | ICD-10-CM | POA: Diagnosis not present

## 2024-03-16 DIAGNOSIS — I4821 Permanent atrial fibrillation: Secondary | ICD-10-CM | POA: Diagnosis present

## 2024-03-16 DIAGNOSIS — I480 Paroxysmal atrial fibrillation: Secondary | ICD-10-CM

## 2024-03-16 SURGERY — PACEMAKER IMPLANT

## 2024-03-16 MED ORDER — ACETAMINOPHEN 325 MG PO TABS: 325.0000 mg | ORAL_TABLET | ORAL | Status: DC | PRN

## 2024-03-16 MED ORDER — SODIUM CHLORIDE 0.9 % IV SOLN
INTRAVENOUS | Status: AC
  Filled 2024-03-16: qty 2

## 2024-03-16 MED ORDER — SODIUM CHLORIDE 0.9 % IV SOLN: INTRAVENOUS | Status: DC

## 2024-03-16 MED ORDER — POVIDONE-IODINE 10 % EX SWAB
2.0000 | Freq: Once | CUTANEOUS | Status: AC
  Administered 2024-03-16: 2 via TOPICAL

## 2024-03-16 MED ORDER — MIDAZOLAM HCL 5 MG/5ML IJ SOLN
INTRAMUSCULAR | Status: DC | PRN
  Administered 2024-03-16: 1 mg via INTRAVENOUS

## 2024-03-16 MED ORDER — CEFAZOLIN SODIUM-DEXTROSE 2-4 GM/100ML-% IV SOLN
INTRAVENOUS | Status: AC
  Filled 2024-03-16: qty 100

## 2024-03-16 MED ORDER — LIDOCAINE HCL (PF) 1 % IJ SOLN
INTRAMUSCULAR | Status: AC
  Filled 2024-03-16: qty 60

## 2024-03-16 MED ORDER — FENTANYL CITRATE (PF) 100 MCG/2ML IJ SOLN
INTRAMUSCULAR | Status: DC | PRN
  Administered 2024-03-16: 25 ug via INTRAVENOUS

## 2024-03-16 MED ORDER — HEPARIN (PORCINE) IN NACL 1000-0.9 UT/500ML-% IV SOLN
INTRAVENOUS | Status: DC | PRN
  Administered 2024-03-16: 500 mL

## 2024-03-16 MED ORDER — FENTANYL CITRATE (PF) 100 MCG/2ML IJ SOLN
INTRAMUSCULAR | Status: AC
Start: 2024-03-16 — End: ?
  Filled 2024-03-16: qty 2

## 2024-03-16 MED ORDER — APIXABAN 5 MG PO TABS: 5.0000 mg | ORAL_TABLET | Freq: Two times a day (BID) | ORAL | 1 refills | Status: DC

## 2024-03-16 MED ORDER — SODIUM CHLORIDE 0.9 % IV SOLN
80.0000 mg | INTRAVENOUS | Status: AC
Start: 2024-03-16 — End: 2024-03-16
  Administered 2024-03-16: 80 mg

## 2024-03-16 MED ORDER — CEFAZOLIN SODIUM-DEXTROSE 2-4 GM/100ML-% IV SOLN
2.0000 g | INTRAVENOUS | Status: AC
  Administered 2024-03-16: 2 g via INTRAVENOUS

## 2024-03-16 MED ORDER — ALBUTEROL SULFATE (2.5 MG/3ML) 0.083% IN NEBU: 3.0000 mL | INHALATION_SOLUTION | RESPIRATORY_TRACT | Status: DC | PRN

## 2024-03-16 MED ORDER — LIDOCAINE HCL (PF) 1 % IJ SOLN
INTRAMUSCULAR | Status: DC | PRN
  Administered 2024-03-16: 60 mL

## 2024-03-16 MED ORDER — IPRATROPIUM-ALBUTEROL 0.5-2.5 (3) MG/3ML IN SOLN
3.0000 mL | Freq: Four times a day (QID) | RESPIRATORY_TRACT | Status: DC | PRN
  Administered 2024-03-16: 3 mL via RESPIRATORY_TRACT
  Filled 2024-03-16: qty 3

## 2024-03-16 MED ORDER — ONDANSETRON HCL 4 MG/2ML IJ SOLN: 4.0000 mg | Freq: Four times a day (QID) | INTRAMUSCULAR | Status: DC | PRN

## 2024-03-16 MED ORDER — MIDAZOLAM HCL 2 MG/2ML IJ SOLN
INTRAMUSCULAR | Status: AC
  Filled 2024-03-16: qty 2

## 2024-03-16 SURGICAL SUPPLY — 11 items
CABLE SURGICAL S-101-97-12 (CABLE) ×1 IMPLANT
CATH RIGHTSITE C315HIS02 (CATHETERS) IMPLANT
IPG PACE AZUR XT SR MRI W1SR01 (Pacemaker) IMPLANT
KIT MICROPUNCTURE NIT STIFF (SHEATH) IMPLANT
LEAD SELECT SECURE 3830 383069 (Lead) IMPLANT
PAD DEFIB RADIO PHYSIO CONN (PAD) ×1 IMPLANT
SHEATH 7FR PRELUDE SNAP 13 (SHEATH) IMPLANT
SLITTER 6232ADJ (MISCELLANEOUS) IMPLANT
SYR CONTROL 10ML ANGIOGRAPHIC (SYRINGE) IMPLANT
TRAY PACEMAKER INSERTION (PACKS) ×1 IMPLANT
WIRE HI TORQ VERSACORE-J 145CM (WIRE) IMPLANT

## 2024-03-16 NOTE — Interval H&P Note (Signed)
 History and Physical Interval Note:  03/16/2024 1:17 PM  [name]  has presented today for surgery, with the diagnosis of afib.  The various methods of treatment have been discussed with the patient and family. After consideration of risks, benefits and other options for treatment, the patient has consented to  Procedure(s): PACEMAKER IMPLANT (N/A) as a surgical intervention.  The patient's history has been reviewed, patient examined, no change in status, stable for surgery.  I have reviewed the patient's chart and labs.  Questions were answered to the patient's satisfaction.     Roann Merk E Fadumo Heng

## 2024-03-16 NOTE — Discharge Instructions (Signed)
 After Your Cardiac Device   You have a [employer] Cardiac Device  ACTIVITY Do not lift your arm above shoulder height for 1 week after your procedure. After 7 days, you may progress as below.  You should remove your sling 24 hours after your procedure, unless otherwise instructed by your provider.     Monday Mar 23, 2024  Tuesday Mar 24, 2024 Wednesday Mar 25, 2024 Thursday Mar 26, 2024   Do not lift, push, pull, or carry anything over 10 pounds with the affected arm until 6 weeks (Monday April 27, 2024 ) after your procedure.   You may drive AFTER your wound check, unless you have been told otherwise by your provider.   Ask your healthcare provider when you can go back to work   INCISION/Dressing If you are on a blood thinner such as Coumadin, Xarelto, Eliquis , Plavix, or Pradaxa please confirm with your provider when this should be resumed.  If large square, outer bandage is left in place, this can be removed after 24 hours from your procedure. Do not remove steri-strips or glue as below.   If a PRESSURE DRESSING (a bulky dressing that usually goes up over your shoulder) was applied or left in place, please follow instructions given by your provider on when to return to have this removed.   Monitor your cardiac device site for redness, swelling, and drainage. Call the device clinic at [phone] if you experience these symptoms or fever/chills.  If your incision is sealed with Steri-strips or staples, you may shower 7 days after your procedure or when told by your provider. Do not remove the steri-strips or let the shower hit directly on your site. You may wash around your site with soap and water .    If you were discharged in a sling, please do not wear this during the day more than 48 hours after your surgery unless otherwise instructed. This may increase the risk of stiffness and soreness in your shoulder.   Avoid lotions, ointments, or perfumes over your incision until it is  well-healed.  You may use a hot tub or a pool AFTER your wound check appointment if the incision is completely closed.   DEVICE MANAGEMENT You should receive your ID card for your new device in 4-8 weeks. Keep this card with you at all times once received. Consider wearing a medical alert bracelet or necklace.  Please follow manufacture instructions for keeping your device charged carefully. This should be performed every 2 weeks at the very least.   Your cardiac device may be MRI compatible. This will be discussed at your next office visit/wound check.  You should avoid contact with strong electric or magnetic fields.   Do not use amateur (ham) radio equipment or electric (arc) welding torches. MP3 player headphones with magnets should not be used. Some devices are safe to use if held at least 12 inches (30 cm) from your cardiac device. These include power tools, lawn mowers, and speakers. If you are unsure if something is safe to use, ask your health care provider.  When using your cell phone, hold it to the ear that is on the opposite side from the cardiac device. Do not leave your cell phone in a pocket over the cardiac device.  You may safely use electric blankets, heating pads, computers, and microwave ovens.  Call the office right away if: You have chest pain. You feel more short of breath than you have felt before. You feel more light-headed  than you have felt before. Your incision starts to open up.  This information is not intended to replace advice given to you by your health care provider. Make sure you discuss any questions you have with your health care provider.

## 2024-03-16 NOTE — Progress Notes (Signed)
 Md Mealor notified that CRX resulted, no pneumo reported, although there were some other findings.

## 2024-03-17 ENCOUNTER — Encounter (HOSPITAL_COMMUNITY): Payer: Self-pay | Admitting: Cardiovascular Disease

## 2024-03-17 MED FILL — Midazolam HCl Inj 2 MG/2ML (Base Equivalent): INTRAMUSCULAR | Qty: 1 | Status: AC

## 2024-03-19 ENCOUNTER — Encounter: Payer: Self-pay | Admitting: Emergency Medicine

## 2024-03-25 ENCOUNTER — Telehealth: Payer: Self-pay | Admitting: Cardiovascular Disease

## 2024-03-25 NOTE — Telephone Encounter (Signed)
 Son in law is calling back look for update. Please advise

## 2024-03-25 NOTE — Telephone Encounter (Signed)
 Pt's Son in Many returning call regarding pt's Ablation. Please advise

## 2024-03-25 NOTE — Telephone Encounter (Signed)
 Spoke with son-in-law, ok per DPR.  He and his wife will be out of town week of June 16th  Discussed with Camilo Cella RN and she has is holding spot to be done 7/10 but can call if someone cancels in June  Advised son-in-law and he was appreciative of call back

## 2024-04-01 ENCOUNTER — Ambulatory Visit

## 2024-04-01 DIAGNOSIS — I4819 Other persistent atrial fibrillation: Secondary | ICD-10-CM | POA: Insufficient documentation

## 2024-04-01 LAB — CUP PACEART INCLINIC DEVICE CHECK
Battery Remaining Longevity: 175 mo
Battery Voltage: 3.22 V
Brady Statistic RV Percent Paced: 31.54 %
Date Time Interrogation Session: 20250514170642
Implantable Lead Connection Status: 753985
Implantable Lead Implant Date: 20250428
Implantable Lead Location: 753860
Implantable Lead Model: 3830
Implantable Pulse Generator Implant Date: 20250428
Lead Channel Impedance Value: 361 Ohm
Lead Channel Impedance Value: 532 Ohm
Lead Channel Pacing Threshold Amplitude: 1 V
Lead Channel Pacing Threshold Pulse Width: 0.4 ms
Lead Channel Sensing Intrinsic Amplitude: 11.25 mV
Lead Channel Sensing Intrinsic Amplitude: 12.5 mV
Lead Channel Setting Pacing Amplitude: 3.5 V
Lead Channel Setting Pacing Pulse Width: 0.4 ms
Lead Channel Setting Sensing Sensitivity: 1.2 mV
Zone Setting Status: 755011

## 2024-04-01 NOTE — Patient Instructions (Signed)

## 2024-04-01 NOTE — Progress Notes (Signed)
 Normal single chamber pacemaker wound check. Presenting rhythm: VS 85-variable . Wound well healed. Routine testing performed. Thresholds, sensing, and impedances consistent with implant measurements and at 3.5V safety margin/auto capture until 3 month visit. Permanent afib pending AV node ablation. Reviewed arm restrictions to continue for 6 weeks total post op.  Pt enrolled in remote follow-up.

## 2024-04-03 ENCOUNTER — Ambulatory Visit: Payer: Self-pay | Admitting: Cardiovascular Disease

## 2024-04-06 ENCOUNTER — Telehealth: Payer: Self-pay

## 2024-04-06 DIAGNOSIS — I4819 Other persistent atrial fibrillation: Secondary | ICD-10-CM

## 2024-04-06 NOTE — Telephone Encounter (Signed)
 Spoke with [name] (DPR on file), AV node ablation scheduled on 04/28/24 at 1000 with Dr Arlester Ladd - labs to be completed on Monday 04/20/24. No further needs at this time

## 2024-04-21 ENCOUNTER — Ambulatory Visit: Payer: Self-pay

## 2024-04-21 ENCOUNTER — Telehealth (HOSPITAL_COMMUNITY): Payer: Self-pay

## 2024-04-21 LAB — CBC
Hematocrit: 37.5 % (ref 34.0–46.6)
Hemoglobin: 11.3 g/dL (ref 11.1–15.9)
MCH: 26.7 pg (ref 26.6–33.0)
MCHC: 30.1 g/dL — ABNORMAL LOW (ref 31.5–35.7)
MCV: 88 fL (ref 79–97)
Platelets: 291 10*3/uL (ref 150–450)
RBC: 4.24 x10E6/uL (ref 3.77–5.28)
RDW: 14.2 % (ref 11.7–15.4)
WBC: 9.3 10*3/uL (ref 3.4–10.8)

## 2024-04-21 LAB — BASIC METABOLIC PANEL WITH GFR
BUN/Creatinine Ratio: 20 (ref 12–28)
BUN/Creatinine Ratio: 21 (ref 12–28)
BUN: 16 mg/dL (ref 8–27)
BUN: 16 mg/dL (ref 8–27)
CO2: 26 mmol/L (ref 20–29)
CO2: 27 mmol/L (ref 20–29)
Calcium: 9.5 mg/dL (ref 8.7–10.3)
Calcium: 9.5 mg/dL (ref 8.7–10.3)
Chloride: 96 mmol/L (ref 96–106)
Chloride: 96 mmol/L (ref 96–106)
Creatinine, Ser: 0.76 mg/dL (ref 0.57–1.00)
Creatinine, Ser: 0.81 mg/dL (ref 0.57–1.00)
Glucose: 109 mg/dL — ABNORMAL HIGH (ref 70–99)
Glucose: 112 mg/dL — ABNORMAL HIGH (ref 70–99)
Potassium: 4.2 mmol/L (ref 3.5–5.2)
Potassium: 4.3 mmol/L (ref 3.5–5.2)
Sodium: 137 mmol/L (ref 134–144)
Sodium: 138 mmol/L (ref 134–144)
eGFR: 70 mL/min/{1.73_m2} (ref >59)
eGFR: 75 mL/min/{1.73_m2} (ref >59)

## 2024-04-21 NOTE — Addendum Note (Signed)
 Addended by: Dangela How O on: 04/21/2024 01:13 PM   Modules accepted: Orders

## 2024-04-21 NOTE — Telephone Encounter (Signed)
 Spoke with patient's daughter [name] to discuss upcoming procedure/DPR on file.   Labs: completed.   Any recent signs of acute illness or been started on antibiotics? No Any new medications started? No Any medications to hold? No Any missed doses of blood thinner? No  Advised patient to continue taking ANTICOAGULANT: Eliquis  (Apixaban ) twice daily without missing any doses.  Medication instructions:  On the morning of your procedure DO NOT take any medication., including Eliquis  or the procedure may be rescheduled. Nothing to eat or drink after midnight prior to your procedure.  Confirmed patient is scheduled for AV Node Ablation on Tuesday, June 10 with Dr. Marlane Silver. Instructed patient to arrive at the Main Entrance A at [facility]: [address] [place], [place] [address] and check in at Admitting at 7:30 AM.  Advised of plan to go home the same day and will only stay overnight if medically necessary. You MUST have a responsible adult to drive you home and MUST be with you the first 24 hours after you arrive home or your procedure could be cancelled.  [name] verbalized understanding to all instructions provided and agreed to proceed with procedure.

## 2024-04-27 NOTE — Pre-Procedure Instructions (Signed)
 Spoke with daughter Deatra Face regarding procedure instructions for tomorrow.   Arrival time 0745 Nothing to eat or drink after midnight No meds AM of procedure Responsible person to drive you home and stay with you for 24 hrs  Have you missed any doses of anti-coagulant Eliquis - takes twice a day, hasn't missed any doses.  Don't take dose morning of procedure.

## 2024-04-28 ENCOUNTER — Ambulatory Visit (HOSPITAL_COMMUNITY)
Admission: RE | Admit: 2024-04-28 | Discharge: 2024-04-28 | Disposition: A | Discharge status: Home / Self Care | Source: Home / Self Care | Attending: Cardiovascular Disease | Admitting: Cardiovascular Disease

## 2024-04-28 ENCOUNTER — Encounter (HOSPITAL_COMMUNITY): Payer: Self-pay | Admitting: Cardiovascular Disease

## 2024-04-28 ENCOUNTER — Other Ambulatory Visit: Payer: Self-pay

## 2024-04-28 ENCOUNTER — Ambulatory Visit (HOSPITAL_COMMUNITY)

## 2024-04-28 ENCOUNTER — Encounter (HOSPITAL_COMMUNITY)
Admission: RE | Disposition: A | Discharge status: Home / Self Care | Payer: Self-pay | Source: Home / Self Care | Attending: Cardiovascular Disease

## 2024-04-28 DIAGNOSIS — E66813 Obesity, class 3: Secondary | ICD-10-CM | POA: Insufficient documentation

## 2024-04-28 DIAGNOSIS — E785 Hyperlipidemia, unspecified: Secondary | ICD-10-CM | POA: Insufficient documentation

## 2024-04-28 DIAGNOSIS — I4819 Other persistent atrial fibrillation: Secondary | ICD-10-CM

## 2024-04-28 DIAGNOSIS — I10 Essential (primary) hypertension: Secondary | ICD-10-CM | POA: Insufficient documentation

## 2024-04-28 DIAGNOSIS — Z6835 Body mass index (BMI) 35.0-35.9, adult: Secondary | ICD-10-CM | POA: Insufficient documentation

## 2024-04-28 DIAGNOSIS — Z79899 Other long term (current) drug therapy: Secondary | ICD-10-CM | POA: Insufficient documentation

## 2024-04-28 DIAGNOSIS — J449 Chronic obstructive pulmonary disease, unspecified: Secondary | ICD-10-CM | POA: Diagnosis not present

## 2024-04-28 DIAGNOSIS — I110 Hypertensive heart disease with heart failure: Secondary | ICD-10-CM | POA: Diagnosis not present

## 2024-04-28 DIAGNOSIS — Z7901 Long term (current) use of anticoagulants: Secondary | ICD-10-CM | POA: Insufficient documentation

## 2024-04-28 DIAGNOSIS — Z8673 Personal history of transient ischemic attack (TIA), and cerebral infarction without residual deficits: Secondary | ICD-10-CM | POA: Insufficient documentation

## 2024-04-28 DIAGNOSIS — I4821 Permanent atrial fibrillation: Secondary | ICD-10-CM | POA: Diagnosis not present

## 2024-04-28 DIAGNOSIS — Z950 Presence of cardiac pacemaker: Secondary | ICD-10-CM | POA: Insufficient documentation

## 2024-04-28 DIAGNOSIS — J690 Pneumonitis due to inhalation of food and vomit: Secondary | ICD-10-CM | POA: Diagnosis not present

## 2024-04-28 DIAGNOSIS — I509 Heart failure, unspecified: Secondary | ICD-10-CM | POA: Diagnosis not present

## 2024-04-28 DIAGNOSIS — K219 Gastro-esophageal reflux disease without esophagitis: Secondary | ICD-10-CM | POA: Insufficient documentation

## 2024-04-28 DIAGNOSIS — D6869 Other thrombophilia: Secondary | ICD-10-CM | POA: Insufficient documentation

## 2024-04-28 DIAGNOSIS — A419 Sepsis, unspecified organism: Secondary | ICD-10-CM | POA: Diagnosis not present

## 2024-04-28 SURGERY — AV NODE ABLATION
Anesthesia: Monitor Anesthesia Care

## 2024-04-28 MED ORDER — PHENYLEPHRINE 80 MCG/ML (10ML) SYRINGE FOR IV PUSH (FOR BLOOD PRESSURE SUPPORT)
PREFILLED_SYRINGE | INTRAVENOUS | Status: DC | PRN
  Administered 2024-04-28: 80 ug via INTRAVENOUS

## 2024-04-28 MED ORDER — ACETAMINOPHEN 325 MG PO TABS
650.0000 mg | ORAL_TABLET | ORAL | Status: DC | PRN
Start: 2024-04-28 — End: 2024-04-28

## 2024-04-28 MED ORDER — FENTANYL CITRATE (PF) 100 MCG/2ML IJ SOLN
INTRAMUSCULAR | Status: AC
Start: 2024-04-28 — End: ?
  Filled 2024-04-28: qty 2

## 2024-04-28 MED ORDER — PROPOFOL 1000 MG/100ML IV EMUL
INTRAVENOUS | Status: AC
  Filled 2024-04-28: qty 100

## 2024-04-28 MED ORDER — SODIUM CHLORIDE 0.9 % IV SOLN: 250.0000 mL | INTRAVENOUS | Status: DC | PRN

## 2024-04-28 MED ORDER — SODIUM CHLORIDE 0.9% FLUSH: 3.0000 mL | Freq: Two times a day (BID) | INTRAVENOUS | Status: DC

## 2024-04-28 MED ORDER — IPRATROPIUM-ALBUTEROL 0.5-2.5 (3) MG/3ML IN SOLN
RESPIRATORY_TRACT | Status: AC
  Filled 2024-04-28: qty 3

## 2024-04-28 MED ORDER — DEXMEDETOMIDINE HCL IN NACL 80 MCG/20ML IV SOLN
INTRAVENOUS | Status: AC
  Filled 2024-04-28: qty 20

## 2024-04-28 MED ORDER — PROPOFOL 10 MG/ML IV BOLUS
INTRAVENOUS | Status: DC | PRN
  Administered 2024-04-28: 40 ug/kg/min via INTRAVENOUS

## 2024-04-28 MED ORDER — ONDANSETRON HCL 4 MG/2ML IJ SOLN: 4.0000 mg | Freq: Four times a day (QID) | INTRAMUSCULAR | Status: DC | PRN

## 2024-04-28 MED ORDER — SODIUM CHLORIDE 0.9 % IV SOLN: INTRAVENOUS | Status: DC

## 2024-04-28 MED ORDER — VANCOMYCIN HCL 1000 MG IV SOLR
INTRAVENOUS | Status: DC | PRN
  Administered 2024-04-28: 1000 mg via INTRAVENOUS

## 2024-04-28 MED ORDER — ONDANSETRON HCL 4 MG/2ML IJ SOLN
INTRAMUSCULAR | Status: DC | PRN
  Administered 2024-04-28: 4 mg via INTRAVENOUS

## 2024-04-28 MED ORDER — IPRATROPIUM-ALBUTEROL 0.5-2.5 (3) MG/3ML IN SOLN
3.0000 mL | Freq: Once | RESPIRATORY_TRACT | Status: AC
  Administered 2024-04-28: 3 mL via RESPIRATORY_TRACT

## 2024-04-28 MED ORDER — FENTANYL CITRATE (PF) 250 MCG/5ML IJ SOLN
INTRAMUSCULAR | Status: DC | PRN
  Administered 2024-04-28 (×2): 25 ug via INTRAVENOUS

## 2024-04-28 MED ORDER — IPRATROPIUM-ALBUTEROL 0.5-2.5 (3) MG/3ML IN SOLN
3.0000 mL | Freq: Once | RESPIRATORY_TRACT | Status: AC
  Administered 2024-04-28: 3 mL via RESPIRATORY_TRACT
  Filled 2024-04-28: qty 3

## 2024-04-28 MED ORDER — HEPARIN (PORCINE) IN NACL 1000-0.9 UT/500ML-% IV SOLN
INTRAVENOUS | Status: DC | PRN
  Administered 2024-04-28: 500 mL

## 2024-04-28 MED ORDER — VANCOMYCIN HCL IN DEXTROSE 1-5 GM/200ML-% IV SOLN
INTRAVENOUS | Status: AC
  Filled 2024-04-28: qty 200

## 2024-04-28 MED ORDER — SODIUM CHLORIDE 0.9% FLUSH: 3.0000 mL | INTRAVENOUS | Status: DC | PRN

## 2024-04-28 MED ORDER — BUPIVACAINE HCL (PF) 0.25 % IJ SOLN
INTRAMUSCULAR | Status: DC | PRN
  Administered 2024-04-28: 30 mL

## 2024-04-28 MED ORDER — ACETAMINOPHEN 500 MG PO TABS
1000.0000 mg | ORAL_TABLET | Freq: Once | ORAL | Status: AC
  Administered 2024-04-28: 1000 mg via ORAL
  Filled 2024-04-28: qty 2

## 2024-04-28 MED ORDER — HEPARIN SODIUM (PORCINE) 1000 UNIT/ML IJ SOLN
INTRAMUSCULAR | Status: AC
  Filled 2024-04-28: qty 10

## 2024-04-28 MED ORDER — BUPIVACAINE HCL (PF) 0.25 % IJ SOLN
INTRAMUSCULAR | Status: AC
  Filled 2024-04-28: qty 30

## 2024-04-28 MED ORDER — DEXMEDETOMIDINE HCL IN NACL 80 MCG/20ML IV SOLN
INTRAVENOUS | Status: DC | PRN
  Administered 2024-04-28: 12 ug via INTRAVENOUS

## 2024-04-28 SURGICAL SUPPLY — 9 items
BAG SNAP BAND KOVER 36X36 (MISCELLANEOUS) IMPLANT
CATH SMTCH THERMOCOOL SF DF (CATHETERS) IMPLANT
DEVICE CLOSURE MYNXGRIP 6/7F (Vascular Products) IMPLANT
PACK EP LF (CUSTOM PROCEDURE TRAY) ×1 IMPLANT
PAD DEFIB RADIO PHYSIO CONN (PAD) ×1 IMPLANT
PATCH CARTO3 (PAD) IMPLANT
SHEATH PINNACLE 8F 10CM (SHEATH) IMPLANT
SHEATH PROBE COVER 6X72 (BAG) IMPLANT
TUBING SMART ABLATE COOLFLOW (TUBING) IMPLANT

## 2024-04-28 NOTE — Progress Notes (Signed)
 Bilateral wheezing noted. Duoneb order received x 1. Respiratory at bedside to administer.[name] E

## 2024-04-28 NOTE — Anesthesia Preprocedure Evaluation (Addendum)
 Anesthesia Evaluation  Patient identified by MRN, date of birth, ID band Patient awake    Reviewed: Allergy & Precautions, H&P , NPO status , Patient's Chart, lab work & pertinent test results, reviewed documented beta blocker date and time   Airway Mallampati: III  TM Distance: >3 FB Neck ROM: Full    Dental no notable dental hx. (+) Teeth Intact, Dental Advisory Given   Pulmonary shortness of breath and with exertion, COPD,  COPD inhaler   Pulmonary exam normal breath sounds clear to auscultation       Cardiovascular hypertension, Pt. on medications and Pt. on home beta blockers +CHF  + dysrhythmias Atrial Fibrillation  Rhythm:Irregular Rate:Tachycardia     Neuro/Psych TIA negative psych ROS   GI/Hepatic Neg liver ROS,GERD  Medicated,,  Endo/Other    Class 3 obesity  Renal/GU negative Renal ROS  negative genitourinary   Musculoskeletal  (+) Arthritis , Osteoarthritis,    Abdominal   Peds  Hematology negative hematology ROS (+)   Anesthesia Other Findings   Reproductive/Obstetrics negative OB ROS                             Anesthesia Physical Anesthesia Plan  ASA: 3  Anesthesia Plan: MAC   Post-op Pain Management: Tylenol  PO (pre-op)*   Induction: Intravenous  PONV Risk Score and Plan: 4 or greater and Ondansetron , Dexamethasone , Treatment may vary due to age or medical condition and Propofol  infusion  Airway Management Planned: Natural Airway and Simple Face Mask  Additional Equipment:   Intra-op Plan:   Post-operative Plan:   Informed Consent: I have reviewed the patients History and Physical, chart, labs and discussed the procedure including the risks, benefits and alternatives for the proposed anesthesia with the patient or authorized representative who has indicated his/her understanding and acceptance.     Dental advisory given  Plan Discussed with:  CRNA  Anesthesia Plan Comments:        Anesthesia Quick Evaluation

## 2024-04-28 NOTE — Transfer of Care (Signed)
 Immediate Anesthesia Transfer of Care Note  Patient: [name]  Procedure(s) Performed: AV NODE ABLATION  Patient Location: Cath Lab  Anesthesia Type:MAC  Level of Consciousness: drowsy  Airway & Oxygen Therapy: Patient Spontanous Breathing and Patient connected to face mask oxygen  Post-op Assessment: Report given to RN and Post -op Vital signs reviewed and stable  Post vital signs: Reviewed and stable  Last Vitals:  Vitals Value Taken Time  BP 122/72 04/28/24 1104  Temp    Pulse 80 04/28/24 1106  Resp 23 04/28/24 1106  SpO2 98 % 04/28/24 1106  Vitals shown include unfiled device data.  Last Pain:  Vitals:   04/28/24 0916  TempSrc:   PainSc: 0-No pain         Complications: No notable events documented.

## 2024-04-28 NOTE — H&P (Signed)
 Electrophysiology Office Note:    Date:  04/28/2024   ID:  [name], DOB [birth year], MRN [id number]  PCP:  Melva Stabile, MD   [facility] Providers Cardiologist:  Wendie Hamburg, MD Electrophysiologist:  Efraim Grange, MD     Referring MD: No ref. provider found   History of Present Illness:    [name] is a 89 y.o. female with a medical history significant for atrial fibrillation, TIA, hypertension, hyperlipidemia, COPD, referred for management of atrial fibrillation.     She was diagnosed with atrial fibrillation about 3 or 4 years ago.  She was taking amiodarone  for a short period, but it was discontinued in October 2024 since she was maintaining sinus rhythm. She did well for a while. She presented to the ER in February 2025 with 3 days of chest discomfort and shortness of breath and found to have a viral pneumonia.  A-fib recurred and amiodarone  was restarted on December 27, 2023.  She ultimately underwent a cardioversion on January 21, 2024.  Unfortunately, the A-fib recurred 2 days later.  She was given additional loading dose of amiodarone  and had another DC cardioversion on March 25 but again had recurrence of atrial fibrillation.  Her diltiazem  was increased to 300 mg daily, and she has continued amiodarone  200 mg daily.  Her son-in-law, an ER physician, has been assisting in her care.  She has several cardiomyopathy tracings that show heart rates between 100-130 bpm.  During home monitoring, her heart rate is rarely below 80 bpm.  The patient reports that she continues to feel very fatigued.  She does not have significant palpitations.      Today, she is fatigued, short of breath.  She just had a duoneb treatment. She has not had any changes in diagnoses, medications, or condition since our last encounter.    EKGs/Labs/Other Studies Reviewed Today:     Echocardiogram:  TTE February 2025 EF 55%.  Left atrium severely dilated.  Right  atrium mildly dilated.    EKG:         Physical Exam:    VS:  BP (!) 141/104   Pulse (!) 115   Temp 97.8 F (36.6 C) (Oral)   Resp (!) 24   Ht 5\' 3"  (1.6 m)   Wt 90.7 kg   SpO2 95%   BMI 35.43 kg/m     Wt Readings from Last 3 Encounters:  04/28/24 90.7 kg  03/16/24 89.4 kg  03/09/24 90.6 kg     GEN: Well nourished, well developed in no acute distress CARDIAC: iRRR, no murmurs, rubs, gallops RESPIRATORY:  Normal work of breathing     ASSESSMENT & PLAN:     Persistent atrial fibrillation Has failed medical management with 2 cardioversions with early return of A-fib while on amiodarone  Having failed amiodarone , with relatively low atrial voltage and severely enlarged left atrium, at age 7, I do not think there are viable options for rhythm control. She is currently on amiodarone  and diltiazem  300 with moderately well-controlled rates at home and significant fatigue on these medications I think her best option for quality of life is pacemaker placement with AVJ ablation to decrease her medication burden and achieve rate control and regularization of her rhythm Pacemaker is in place and functioning normally She presents today for AVJ ablation  We discussed the indication, rationale, logistics, anticipated benefits, and potential risks of the ablation procedure including but not limited to -- bleed at the groin access site, damage  to the heart including perforation.  Pacemaker placement includes additional risk of infection and localized bleeding, device malfunction such as lead fracture or displacement which could be life-threatening after AVJ ablation.  Secondary hypercoagulable state CHA2DS2-VASc score is 5 Continue Eliquis  5 mg twice daily  High risk medication -- amiodarone  I will intend to discontinue this after her AVJ ablation      Signed, Efraim Grange, MD  04/28/2024 9:41 AM    [facility]

## 2024-04-28 NOTE — Discharge Instructions (Addendum)

## 2024-04-28 NOTE — Progress Notes (Signed)
 Patient arrived to cath lab holding area s/p AV node ablation. Patient arouses to voice. Patient answers questions and follows commands appropriately. Right groin dressing dry/intact. No bleeding or hematoma present. Post activity and precautions explained. Simple 02 mask removed and replaced with 2L nasal cannula. O2 sat 94%. Will continue to monitor per orders and protocol.Justyne Roell E

## 2024-04-28 NOTE — Anesthesia Postprocedure Evaluation (Signed)
 Anesthesia Post Note  Patient: [name]  Procedure(s) Performed: AV NODE ABLATION     Patient location during evaluation: PACU Anesthesia Type: MAC Level of consciousness: awake and alert Pain management: pain level controlled Vital Signs Assessment: post-procedure vital signs reviewed and stable Respiratory status: spontaneous breathing, nonlabored ventilation, respiratory function stable and patient connected to nasal cannula oxygen Cardiovascular status: stable and blood pressure returned to baseline Postop Assessment: no apparent nausea or vomiting Anesthetic complications: no  No notable events documented.  Last Vitals:  Vitals:   04/28/24 1315 04/28/24 1330  BP: 129/78 132/81  Pulse: 90 90  Resp: (!) 23 (!) 21  Temp:    SpO2: 93% 94%    Last Pain:  Vitals:   04/28/24 0916  TempSrc:   PainSc: 0-No pain                 Daneshia Tavano,W. EDMOND

## 2024-04-29 ENCOUNTER — Emergency Department (HOSPITAL_COMMUNITY)

## 2024-04-29 ENCOUNTER — Other Ambulatory Visit: Payer: Self-pay

## 2024-04-29 ENCOUNTER — Inpatient Hospital Stay (HOSPITAL_COMMUNITY)

## 2024-04-29 ENCOUNTER — Inpatient Hospital Stay (HOSPITAL_COMMUNITY)
Admission: EM | Admit: 2024-04-29 | Discharge: 2024-05-19 | DRG: 853 | Disposition: E | Attending: Internal Medicine | Admitting: Internal Medicine

## 2024-04-29 ENCOUNTER — Telehealth (HOSPITAL_COMMUNITY): Payer: Self-pay

## 2024-04-29 DIAGNOSIS — I2694 Multiple subsegmental pulmonary emboli without acute cor pulmonale: Secondary | ICD-10-CM | POA: Diagnosis not present

## 2024-04-29 DIAGNOSIS — E785 Hyperlipidemia, unspecified: Secondary | ICD-10-CM | POA: Diagnosis present

## 2024-04-29 DIAGNOSIS — J189 Pneumonia, unspecified organism: Secondary | ICD-10-CM | POA: Diagnosis present

## 2024-04-29 DIAGNOSIS — R402432 Glasgow coma scale score 3-8, at arrival to emergency department: Principal | ICD-10-CM

## 2024-04-29 DIAGNOSIS — E66813 Obesity, class 3: Secondary | ICD-10-CM | POA: Diagnosis present

## 2024-04-29 DIAGNOSIS — G9341 Metabolic encephalopathy: Secondary | ICD-10-CM | POA: Diagnosis present

## 2024-04-29 DIAGNOSIS — R7989 Other specified abnormal findings of blood chemistry: Secondary | ICD-10-CM | POA: Diagnosis present

## 2024-04-29 DIAGNOSIS — E872 Acidosis, unspecified: Secondary | ICD-10-CM | POA: Diagnosis present

## 2024-04-29 DIAGNOSIS — J440 Chronic obstructive pulmonary disease with acute lower respiratory infection: Secondary | ICD-10-CM | POA: Diagnosis present

## 2024-04-29 DIAGNOSIS — I2699 Other pulmonary embolism without acute cor pulmonale: Secondary | ICD-10-CM | POA: Diagnosis present

## 2024-04-29 DIAGNOSIS — Z66 Do not resuscitate: Secondary | ICD-10-CM | POA: Diagnosis not present

## 2024-04-29 DIAGNOSIS — J9601 Acute respiratory failure with hypoxia: Secondary | ICD-10-CM | POA: Diagnosis present

## 2024-04-29 DIAGNOSIS — N179 Acute kidney failure, unspecified: Secondary | ICD-10-CM

## 2024-04-29 DIAGNOSIS — I272 Pulmonary hypertension, unspecified: Secondary | ICD-10-CM | POA: Diagnosis present

## 2024-04-29 DIAGNOSIS — E875 Hyperkalemia: Secondary | ICD-10-CM | POA: Diagnosis present

## 2024-04-29 DIAGNOSIS — J90 Pleural effusion, not elsewhere classified: Secondary | ICD-10-CM | POA: Diagnosis not present

## 2024-04-29 DIAGNOSIS — J449 Chronic obstructive pulmonary disease, unspecified: Secondary | ICD-10-CM | POA: Diagnosis present

## 2024-04-29 DIAGNOSIS — I10 Essential (primary) hypertension: Secondary | ICD-10-CM | POA: Diagnosis present

## 2024-04-29 DIAGNOSIS — Z6835 Body mass index (BMI) 35.0-35.9, adult: Secondary | ICD-10-CM

## 2024-04-29 DIAGNOSIS — J690 Pneumonitis due to inhalation of food and vomit: Secondary | ICD-10-CM | POA: Diagnosis present

## 2024-04-29 DIAGNOSIS — A419 Sepsis, unspecified organism: Secondary | ICD-10-CM | POA: Diagnosis present

## 2024-04-29 DIAGNOSIS — M7989 Other specified soft tissue disorders: Secondary | ICD-10-CM

## 2024-04-29 DIAGNOSIS — Z8673 Personal history of transient ischemic attack (TIA), and cerebral infarction without residual deficits: Secondary | ICD-10-CM

## 2024-04-29 DIAGNOSIS — Z781 Physical restraint status: Secondary | ICD-10-CM

## 2024-04-29 DIAGNOSIS — Z79899 Other long term (current) drug therapy: Secondary | ICD-10-CM | POA: Diagnosis not present

## 2024-04-29 DIAGNOSIS — Z7901 Long term (current) use of anticoagulants: Secondary | ICD-10-CM | POA: Diagnosis not present

## 2024-04-29 DIAGNOSIS — I4821 Permanent atrial fibrillation: Secondary | ICD-10-CM | POA: Diagnosis present

## 2024-04-29 DIAGNOSIS — Z515 Encounter for palliative care: Secondary | ICD-10-CM | POA: Diagnosis not present

## 2024-04-29 DIAGNOSIS — R54 Age-related physical debility: Secondary | ICD-10-CM | POA: Diagnosis present

## 2024-04-29 DIAGNOSIS — D6869 Other thrombophilia: Secondary | ICD-10-CM | POA: Diagnosis present

## 2024-04-29 DIAGNOSIS — R6521 Severe sepsis with septic shock: Secondary | ICD-10-CM | POA: Diagnosis present

## 2024-04-29 DIAGNOSIS — K219 Gastro-esophageal reflux disease without esophagitis: Secondary | ICD-10-CM | POA: Diagnosis present

## 2024-04-29 DIAGNOSIS — R609 Edema, unspecified: Secondary | ICD-10-CM

## 2024-04-29 DIAGNOSIS — Z950 Presence of cardiac pacemaker: Secondary | ICD-10-CM

## 2024-04-29 DIAGNOSIS — F05 Delirium due to known physiological condition: Secondary | ICD-10-CM | POA: Diagnosis present

## 2024-04-29 DIAGNOSIS — G9340 Encephalopathy, unspecified: Secondary | ICD-10-CM | POA: Diagnosis not present

## 2024-04-29 DIAGNOSIS — I2609 Other pulmonary embolism with acute cor pulmonale: Secondary | ICD-10-CM

## 2024-04-29 DIAGNOSIS — Z7722 Contact with and (suspected) exposure to environmental tobacco smoke (acute) (chronic): Secondary | ICD-10-CM | POA: Diagnosis present

## 2024-04-29 LAB — URINALYSIS, W/ REFLEX TO CULTURE (INFECTION SUSPECTED)
Bacteria, UA: NONE SEEN
Bilirubin Urine: NEGATIVE
Glucose, UA: 50 mg/dL — AB
Hgb urine dipstick: NEGATIVE
Ketones, ur: NEGATIVE mg/dL
Leukocytes,Ua: NEGATIVE
Nitrite: NEGATIVE
Protein, ur: 30 mg/dL — AB
Specific Gravity, Urine: >1.046 — ABNORMAL HIGH (ref 1.005–1.030)
pH: 5 (ref 5.0–8.0)

## 2024-04-29 LAB — COMPREHENSIVE METABOLIC PANEL WITH GFR
ALT: 63 U/L — ABNORMAL HIGH (ref 0–44)
ALT: 67 U/L — ABNORMAL HIGH (ref 0–44)
AST: 66 U/L — ABNORMAL HIGH (ref 15–41)
AST: 40 U/L (ref 15–41)
Albumin: 3.4 g/dL — ABNORMAL LOW (ref 3.5–5.0)
Albumin: 2.9 g/dL — ABNORMAL LOW (ref 3.5–5.0)
Alkaline Phosphatase: 97 U/L (ref 38–126)
Alkaline Phosphatase: 78 U/L (ref 38–126)
Anion gap: 8 (ref 5–15)
Anion gap: 7 (ref 5–15)
BUN: 22 mg/dL (ref 8–23)
BUN: 21 mg/dL (ref 8–23)
CO2: 26 mmol/L (ref 22–32)
CO2: 28 mmol/L (ref 22–32)
Calcium: 8.7 mg/dL — ABNORMAL LOW (ref 8.9–10.3)
Calcium: 9.1 mg/dL (ref 8.9–10.3)
Chloride: 101 mmol/L (ref 98–111)
Chloride: 104 mmol/L (ref 98–111)
Creatinine, Ser: 1.2 mg/dL — ABNORMAL HIGH (ref 0.44–1.00)
Creatinine, Ser: 1.15 mg/dL — ABNORMAL HIGH (ref 0.44–1.00)
GFR, Estimated: 43 mL/min — ABNORMAL LOW (ref >60)
GFR, Estimated: 46 mL/min — ABNORMAL LOW (ref >60)
Glucose, Bld: 205 mg/dL — ABNORMAL HIGH (ref 70–99)
Glucose, Bld: 113 mg/dL — ABNORMAL HIGH (ref 70–99)
Potassium: 5.9 mmol/L — ABNORMAL HIGH (ref 3.5–5.1)
Potassium: 4.9 mmol/L (ref 3.5–5.1)
Sodium: 135 mmol/L (ref 135–145)
Sodium: 139 mmol/L (ref 135–145)
Total Bilirubin: 0.6 mg/dL (ref 0.0–1.2)
Total Bilirubin: 1 mg/dL (ref 0.0–1.2)
Total Protein: 6 g/dL — ABNORMAL LOW (ref 6.5–8.1)
Total Protein: 5.4 g/dL — ABNORMAL LOW (ref 6.5–8.1)

## 2024-04-29 LAB — ECHOCARDIOGRAM COMPLETE
AR max vel: 2.38 cm2
AV Area VTI: 2.29 cm2
AV Area mean vel: 2.3 cm2
AV Mean grad: 3 mmHg
AV Peak grad: 4.8 mmHg
Ao pk vel: 1.1 m/s
Area-P 1/2: 3.65 cm2
Calc EF: 41 %
Height: 63 in
MV M vel: 3.77 m/s
MV Peak grad: 56.9 mmHg
S' Lateral: 4.2 cm
Single Plane A2C EF: 45.3 %
Single Plane A4C EF: 37.2 %
Weight: 3241.64 [oz_av]

## 2024-04-29 LAB — CBC WITH DIFFERENTIAL/PLATELET
Abs Immature Granulocytes: 0.19 10*3/uL — ABNORMAL HIGH (ref 0.00–0.07)
Basophils Absolute: 0.1 10*3/uL (ref 0.0–0.1)
Basophils Relative: 1 %
Eosinophils Absolute: 0.4 10*3/uL (ref 0.0–0.5)
Eosinophils Relative: 3 %
HCT: 40.6 % (ref 36.0–46.0)
Hemoglobin: 11.9 g/dL — ABNORMAL LOW (ref 12.0–15.0)
Immature Granulocytes: 1 %
Lymphocytes Relative: 22 %
Lymphs Abs: 3.4 10*3/uL (ref 0.7–4.0)
MCH: 27.2 pg (ref 26.0–34.0)
MCHC: 29.3 g/dL — ABNORMAL LOW (ref 30.0–36.0)
MCV: 92.7 fL (ref 80.0–100.0)
Monocytes Absolute: 0.9 10*3/uL (ref 0.1–1.0)
Monocytes Relative: 6 %
Neutro Abs: 10.6 10*3/uL — ABNORMAL HIGH (ref 1.7–7.7)
Neutrophils Relative %: 67 %
Platelets: 309 10*3/uL (ref 150–400)
RBC: 4.38 MIL/uL (ref 3.87–5.11)
RDW: 15.2 % (ref 11.5–15.5)
WBC: 15.6 10*3/uL — ABNORMAL HIGH (ref 4.0–10.5)
nRBC: 0 % (ref 0.0–0.2)

## 2024-04-29 LAB — POCT I-STAT 7, (LYTES, BLD GAS, ICA,H+H)
Acid-base deficit: 1 mmol/L (ref 0.0–2.0)
Bicarbonate: 24.9 mmol/L (ref 20.0–28.0)
Calcium, Ion: 1.29 mmol/L (ref 1.15–1.40)
HCT: 34 % — ABNORMAL LOW (ref 36.0–46.0)
Hemoglobin: 11.6 g/dL — ABNORMAL LOW (ref 12.0–15.0)
O2 Saturation: 99 %
Patient temperature: 98.8
Potassium: 4.4 mmol/L (ref 3.5–5.1)
Sodium: 136 mmol/L (ref 135–145)
TCO2: 26 mmol/L (ref 22–32)
pCO2 arterial: 46.7 mmHg (ref 32–48)
pH, Arterial: 7.337 — ABNORMAL LOW (ref 7.35–7.45)
pO2, Arterial: 156 mmHg — ABNORMAL HIGH (ref 83–108)

## 2024-04-29 LAB — TROPONIN I (HIGH SENSITIVITY)
Troponin I (High Sensitivity): 94 ng/L — ABNORMAL HIGH (ref <18)
Troponin I (High Sensitivity): 136 ng/L (ref <18)
Troponin I (High Sensitivity): 162 ng/L (ref <18)
Troponin I (High Sensitivity): 125 ng/L (ref <18)

## 2024-04-29 LAB — HEMOGLOBIN A1C
Hgb A1c MFr Bld: 5.9 % — ABNORMAL HIGH (ref 4.8–5.6)
Mean Plasma Glucose: 122.63 mg/dL

## 2024-04-29 LAB — CBC
HCT: 35.4 % — ABNORMAL LOW (ref 36.0–46.0)
Hemoglobin: 10.9 g/dL — ABNORMAL LOW (ref 12.0–15.0)
MCH: 26.8 pg (ref 26.0–34.0)
MCHC: 30.8 g/dL (ref 30.0–36.0)
MCV: 87.2 fL (ref 80.0–100.0)
Platelets: 243 10*3/uL (ref 150–400)
RBC: 4.06 MIL/uL (ref 3.87–5.11)
RDW: 15.3 % (ref 11.5–15.5)
WBC: 15.4 10*3/uL — ABNORMAL HIGH (ref 4.0–10.5)
nRBC: 0 % (ref 0.0–0.2)

## 2024-04-29 LAB — BASIC METABOLIC PANEL WITH GFR
Anion gap: 11 (ref 5–15)
BUN: 19 mg/dL (ref 8–23)
CO2: 24 mmol/L (ref 22–32)
Calcium: 8.9 mg/dL (ref 8.9–10.3)
Chloride: 103 mmol/L (ref 98–111)
Creatinine, Ser: 1.04 mg/dL — ABNORMAL HIGH (ref 0.44–1.00)
GFR, Estimated: 51 mL/min — ABNORMAL LOW (ref >60)
Glucose, Bld: 138 mg/dL — ABNORMAL HIGH (ref 70–99)
Potassium: 5.3 mmol/L — ABNORMAL HIGH (ref 3.5–5.1)
Sodium: 138 mmol/L (ref 135–145)

## 2024-04-29 LAB — GLUCOSE, CAPILLARY
Glucose-Capillary: 123 mg/dL — ABNORMAL HIGH (ref 70–99)
Glucose-Capillary: 115 mg/dL — ABNORMAL HIGH (ref 70–99)
Glucose-Capillary: 110 mg/dL — ABNORMAL HIGH (ref 70–99)
Glucose-Capillary: 101 mg/dL — ABNORMAL HIGH (ref 70–99)
Glucose-Capillary: 79 mg/dL (ref 70–99)
Glucose-Capillary: 85 mg/dL (ref 70–99)

## 2024-04-29 LAB — I-STAT CHEM 8, ED
BUN: 26 mg/dL — ABNORMAL HIGH (ref 8–23)
Calcium, Ion: 1.24 mmol/L (ref 1.15–1.40)
Chloride: 99 mmol/L (ref 98–111)
Creatinine, Ser: 1.2 mg/dL — ABNORMAL HIGH (ref 0.44–1.00)
Glucose, Bld: 204 mg/dL — ABNORMAL HIGH (ref 70–99)
HCT: 40 % (ref 36.0–46.0)
Hemoglobin: 13.6 g/dL (ref 12.0–15.0)
Potassium: 5.9 mmol/L — ABNORMAL HIGH (ref 3.5–5.1)
Sodium: 137 mmol/L (ref 135–145)
TCO2: 29 mmol/L (ref 22–32)

## 2024-04-29 LAB — I-STAT CG4 LACTIC ACID, ED: Lactic Acid, Venous: 2.2 mmol/L (ref 0.5–1.9)

## 2024-04-29 LAB — MAGNESIUM: Magnesium: 2.2 mg/dL (ref 1.7–2.4)

## 2024-04-29 LAB — BRAIN NATRIURETIC PEPTIDE: B Natriuretic Peptide: 327.8 pg/mL — ABNORMAL HIGH (ref 0.0–100.0)

## 2024-04-29 LAB — APTT: aPTT: 94 s — ABNORMAL HIGH (ref 24–36)

## 2024-04-29 LAB — LACTIC ACID, PLASMA
Lactic Acid, Venous: 1.8 mmol/L (ref 0.5–1.9)
Lactic Acid, Venous: 1.5 mmol/L (ref 0.5–1.9)

## 2024-04-29 LAB — LIPASE, BLOOD: Lipase: 37 U/L (ref 11–51)

## 2024-04-29 LAB — MRSA NEXT GEN BY PCR, NASAL: MRSA by PCR Next Gen: NOT DETECTED

## 2024-04-29 LAB — HEPARIN LEVEL (UNFRACTIONATED): Heparin Unfractionated: >1.1 [IU]/mL — ABNORMAL HIGH (ref 0.30–0.70)

## 2024-04-29 MED ORDER — FENTANYL 2500MCG IN NS 250ML (10MCG/ML) PREMIX INFUSION
0.0000 ug/h | INTRAVENOUS | Status: DC
  Administered 2024-04-29: 125 ug/h via INTRAVENOUS
  Administered 2024-04-29: 50 ug/h via INTRAVENOUS
  Filled 2024-04-29 (×2): qty 250

## 2024-04-29 MED ORDER — VANCOMYCIN HCL 2000 MG/400ML IV SOLN
2000.0000 mg | Freq: Once | INTRAVENOUS | Status: AC
  Administered 2024-04-29: 2000 mg via INTRAVENOUS
  Filled 2024-04-29: qty 400

## 2024-04-29 MED ORDER — FENTANYL BOLUS VIA INFUSION: 25.0000 ug | INTRAVENOUS | Status: DC | PRN

## 2024-04-29 MED ORDER — FENTANYL CITRATE PF 50 MCG/ML IJ SOSY
25.0000 ug | PREFILLED_SYRINGE | INTRAMUSCULAR | Status: DC | PRN
  Administered 2024-04-29: 25 ug via INTRAVENOUS

## 2024-04-29 MED ORDER — INSULIN ASPART 100 UNIT/ML IV SOLN
5.0000 [IU] | Freq: Once | INTRAVENOUS | Status: AC
Start: 2024-04-29 — End: 2024-04-29
  Administered 2024-04-29: 5 [IU] via INTRAVENOUS

## 2024-04-29 MED ORDER — PIPERACILLIN-TAZOBACTAM 3.375 G IVPB: 3.3750 g | Freq: Four times a day (QID) | INTRAVENOUS | Status: DC

## 2024-04-29 MED ORDER — BUDESONIDE 0.25 MG/2ML IN SUSP
0.2500 mg | Freq: Two times a day (BID) | RESPIRATORY_TRACT | Status: DC
  Administered 2024-04-29 – 2024-05-03 (×8): 0.25 mg via RESPIRATORY_TRACT
  Filled 2024-04-29 (×8): qty 2

## 2024-04-29 MED ORDER — SODIUM BICARBONATE 8.4 % IV SOLN
50.0000 meq | Freq: Once | INTRAVENOUS | Status: AC
  Administered 2024-04-29: 50 meq via INTRAVENOUS
  Filled 2024-04-29: qty 50

## 2024-04-29 MED ORDER — CHLORHEXIDINE GLUCONATE CLOTH 2 % EX PADS
6.0000 | MEDICATED_PAD | Freq: Every day | CUTANEOUS | Status: DC
  Administered 2024-04-29 – 2024-05-02 (×5): 6 via TOPICAL

## 2024-04-29 MED ORDER — SODIUM CHLORIDE 0.9 % IV SOLN: 250.0000 mL | INTRAVENOUS | Status: DC

## 2024-04-29 MED ORDER — ORAL CARE MOUTH RINSE
15.0000 mL | OROMUCOSAL | Status: DC
  Administered 2024-04-29 – 2024-05-01 (×26): 15 mL via OROMUCOSAL

## 2024-04-29 MED ORDER — NOREPINEPHRINE 4 MG/250ML-% IV SOLN
0.0000 ug/min | INTRAVENOUS | Status: DC
  Administered 2024-04-29: 3 ug/min via INTRAVENOUS
  Filled 2024-04-29: qty 250

## 2024-04-29 MED ORDER — INSULIN ASPART 100 UNIT/ML IJ SOLN: 0.0000 [IU] | INTRAMUSCULAR | Status: DC

## 2024-04-29 MED ORDER — POLYETHYLENE GLYCOL 3350 17 G PO PACK: 17.0000 g | PACK | Freq: Every day | ORAL | Status: DC | PRN

## 2024-04-29 MED ORDER — ALBUTEROL SULFATE (2.5 MG/3ML) 0.083% IN NEBU
2.5000 mg | INHALATION_SOLUTION | RESPIRATORY_TRACT | Status: DC | PRN
  Administered 2024-04-30: 2.5 mg via RESPIRATORY_TRACT
  Filled 2024-04-29: qty 3

## 2024-04-29 MED ORDER — ROCURONIUM BROMIDE 10 MG/ML (PF) SYRINGE
PREFILLED_SYRINGE | INTRAVENOUS | Status: AC | PRN
  Administered 2024-04-29: 100 mg via INTRAVENOUS

## 2024-04-29 MED ORDER — PIPERACILLIN-TAZOBACTAM 3.375 G IVPB 30 MIN
3.3750 g | Freq: Once | INTRAVENOUS | Status: AC
  Administered 2024-04-29: 3.375 g via INTRAVENOUS
  Filled 2024-04-29: qty 50

## 2024-04-29 MED ORDER — IOHEXOL 350 MG/ML SOLN
75.0000 mL | Freq: Once | INTRAVENOUS | Status: AC | PRN
  Administered 2024-04-29: 75 mL via INTRAVENOUS

## 2024-04-29 MED ORDER — LACTATED RINGERS IV BOLUS
500.0000 mL | Freq: Once | INTRAVENOUS | Status: AC
  Administered 2024-04-29: 500 mL via INTRAVENOUS

## 2024-04-29 MED ORDER — HEPARIN (PORCINE) 25000 UT/250ML-% IV SOLN
1300.0000 [IU]/h | INTRAVENOUS | Status: DC
  Administered 2024-04-29 – 2024-04-30 (×2): 1300 [IU]/h via INTRAVENOUS
  Filled 2024-04-29 (×2): qty 250

## 2024-04-29 MED ORDER — ETOMIDATE 2 MG/ML IV SOLN
INTRAVENOUS | Status: AC | PRN
  Administered 2024-04-29: 20 mg via INTRAVENOUS

## 2024-04-29 MED ORDER — CALCIUM GLUCONATE 10 % IV SOLN
1.0000 g | Freq: Once | INTRAVENOUS | Status: AC
  Administered 2024-04-29: 1 g via INTRAVENOUS
  Filled 2024-04-29: qty 10

## 2024-04-29 MED ORDER — FENTANYL CITRATE PF 50 MCG/ML IJ SOSY: 25.0000 ug | PREFILLED_SYRINGE | Freq: Once | INTRAMUSCULAR | Status: DC

## 2024-04-29 MED ORDER — SODIUM CHLORIDE 0.9 % IV SOLN
250.0000 mL | INTRAVENOUS | Status: AC
  Administered 2024-04-29: 250 mL via INTRAVENOUS

## 2024-04-29 MED ORDER — ORAL CARE MOUTH RINSE: 15.0000 mL | OROMUCOSAL | Status: DC | PRN

## 2024-04-29 MED ORDER — ARFORMOTEROL TARTRATE 15 MCG/2ML IN NEBU
15.0000 ug | INHALATION_SOLUTION | Freq: Two times a day (BID) | RESPIRATORY_TRACT | Status: DC
  Administered 2024-04-29 – 2024-05-03 (×8): 15 ug via RESPIRATORY_TRACT
  Filled 2024-04-29 (×8): qty 2

## 2024-04-29 MED ORDER — FENTANYL CITRATE PF 50 MCG/ML IJ SOSY
25.0000 ug | PREFILLED_SYRINGE | INTRAMUSCULAR | Status: DC | PRN
  Administered 2024-04-29: 50 ug via INTRAVENOUS
  Administered 2024-04-29 (×2): 100 ug via INTRAVENOUS
  Administered 2024-04-29: 50 ug via INTRAVENOUS
  Administered 2024-04-29: 100 ug via INTRAVENOUS
  Administered 2024-04-29: 50 ug via INTRAVENOUS
  Filled 2024-04-29 (×3): qty 1
  Filled 2024-04-29: qty 2
  Filled 2024-04-29: qty 1
  Filled 2024-04-29 (×2): qty 2

## 2024-04-29 MED ORDER — ACETAMINOPHEN 325 MG PO TABS
650.0000 mg | ORAL_TABLET | Freq: Four times a day (QID) | ORAL | Status: DC | PRN
  Administered 2024-04-29 – 2024-04-30 (×2): 650 mg
  Filled 2024-04-29 (×2): qty 2

## 2024-04-29 MED ORDER — DEXTROSE 50 % IV SOLN
1.0000 | Freq: Once | INTRAVENOUS | Status: AC
  Administered 2024-04-29: 50 mL via INTRAVENOUS
  Filled 2024-04-29: qty 50

## 2024-04-29 MED ORDER — DOCUSATE SODIUM 100 MG PO CAPS: 100.0000 mg | ORAL_CAPSULE | Freq: Two times a day (BID) | ORAL | Status: DC | PRN

## 2024-04-29 MED ORDER — HEPARIN SODIUM (PORCINE) 5000 UNIT/ML IJ SOLN
5000.0000 [IU] | Freq: Two times a day (BID) | INTRAMUSCULAR | Status: DC
Start: 2024-04-29 — End: 2024-04-29
  Administered 2024-04-29: 5000 [IU] via SUBCUTANEOUS
  Filled 2024-04-29: qty 1

## 2024-04-29 MED ORDER — FAMOTIDINE 20 MG PO TABS
20.0000 mg | ORAL_TABLET | Freq: Every day | ORAL | Status: DC
  Administered 2024-04-30: 20 mg
  Filled 2024-04-29: qty 1

## 2024-04-29 MED ORDER — REVEFENACIN 175 MCG/3ML IN SOLN
175.0000 ug | Freq: Every day | RESPIRATORY_TRACT | Status: DC
  Administered 2024-04-30 – 2024-05-03 (×4): 175 ug via RESPIRATORY_TRACT
  Filled 2024-04-29 (×4): qty 3

## 2024-04-29 MED ORDER — IPRATROPIUM-ALBUTEROL 0.5-2.5 (3) MG/3ML IN SOLN
3.0000 mL | Freq: Four times a day (QID) | RESPIRATORY_TRACT | Status: DC
  Administered 2024-04-29 (×2): 3 mL via RESPIRATORY_TRACT
  Filled 2024-04-29 (×2): qty 3

## 2024-04-29 MED ORDER — FAMOTIDINE 20 MG PO TABS
20.0000 mg | ORAL_TABLET | Freq: Every day | ORAL | Status: DC
  Filled 2024-04-29: qty 1

## 2024-04-29 MED ORDER — PROPOFOL 1000 MG/100ML IV EMUL
0.0000 ug/kg/min | INTRAVENOUS | Status: DC
  Administered 2024-04-29: 5 ug/kg/min via INTRAVENOUS
  Administered 2024-04-30 (×2): 20 ug/kg/min via INTRAVENOUS
  Filled 2024-04-29 (×3): qty 100

## 2024-04-29 MED ORDER — PIPERACILLIN-TAZOBACTAM 3.375 G IVPB
3.3750 g | Freq: Three times a day (TID) | INTRAVENOUS | Status: DC
  Administered 2024-04-29 – 2024-05-01 (×8): 3.375 g via INTRAVENOUS
  Filled 2024-04-29 (×8): qty 50

## 2024-04-29 NOTE — Progress Notes (Signed)
 Very low urine output this shift, providers aware. Some leakage noted from foley underneath patient. Foley flushed with no leaking seen, bladder scan showed 0 ml. Night shift RN made aware.

## 2024-04-29 NOTE — Progress Notes (Signed)
 eLink Physician-Brief Progress Note Patient Name: [name] DOB: Nov 14, 1933 MRN: [id number]   Date of Service  04/29/2024  HPI/Events of Note  Patient admitted with altered mental status, hyperkalemia, and suspected aspiration pneumonia, she was intubated for airway protection, work up is in progress.  eICU Interventions  New Patient Evaluation.        Kristofer Schaffert U Laria Grimmett 04/29/2024, 3:15 AM

## 2024-04-29 NOTE — ED Triage Notes (Signed)
 BIB EMS from home.  Family found pt on bedside commode and described moaning  Family member is an ED doc reportedly and opened airway until EMS arrived.  Pt arrived being bagged with NPA   16G L EJ in place.  EDP and RT at bedside.  Pt had ablation today for afib.  Hx of CHF.

## 2024-04-29 NOTE — Progress Notes (Signed)
 NAMEShadasia [name], MRN:  [id number], DOB:  07/21/1933, LOS: 0 ADMISSION DATE:  04/29/2024, CONSULTATION DATE: 04/29/2024 REFERRING MD: Onetha Bile, MD, CHIEF COMPLAINT: AMS for 4 hrs  History of Present Illness:  An 89 yr old female patient with PPM (8 weeks ago), PAF (failed cardioversion twice in March 2025), HTN, COPD, GERD, TIA, and hyperlipidemia. She underwent Afib ablation yesterday noon time and discharged home. At 9:15 pm, she was weak, moaning, altered, and had a weak pulse and syncope. She never lost a pulse or had apnea. EMS arrived and she was intubated due to low GCS 4. Amiodarone  was d/c 2 months ago. Compliant with meds. No hx of active smoking (but second hand smoking), illicit drug use, or alcohol drinking. Given one dose of Vanco and Zosyn. Started on Levophed due to hypotension. Medical Rx for hyperkalemia was given in ED.   Pertinent  Medical History  PPM (April 2025), PAF (failed cardioversion twice March 2025), HTN, COPD, GERD, TIA, hyperlipidemia Significant Hospital Events: Including procedures, antibiotic start and stop dates in addition to other pertinent events   6/11: intubated, seen in ED, admit to ICU, CTA significant for R segmental PE  Interim History / Subjective:  Off norepi, CTA resulted overnight with R segmental PE and LV/RV ratio of 1.3  Objective    Blood pressure 138/77, pulse 89, temperature 99.7 F (37.6 C), resp. rate 20, height 5' 3 (1.6 m), weight 91.9 kg, SpO2 100%.    Vent Mode: PRVC FiO2 (%):  [50 %-100 %] 50 % Set Rate:  [18 bmp-20 bmp] 20 bmp Vt Set:  [500 mL] 500 mL PEEP:  [5 cmH20] 5 cmH20 Plateau Pressure:  [26 cmH20-31 cmH20] 26 cmH20   Intake/Output Summary (Last 24 hours) at 04/29/2024 4098 Last data filed at 04/29/2024 0751 Gross per 24 hour  Intake --  Output 320 ml  Net -320 ml   Filed Weights   04/29/24 0300  Weight: 91.9 kg    General:  well nourished elderly F intubated and sedated in NAD  HEENT: MM  pink/moist, ETT in place Neuro: examined on fentanyl , withdraws to pain, intermittently restless CV: s1s2 rrr, no m/r/g PULM:  expiratory wheezing throughout, synchronous with vent  GI: soft, bsx4 active  Extremities: warm/dry, trace edema  Skin: no rashes or lesions    Resolved problem list   Assessment and Plan     Acute hypoxic resp failure secondary to aspiration Pneumonia and R segmental PE Baseline COPD R pleural effusion -check stat Echo and start heparin  gtt, Eliquis  was held 24 hrs prior to her ablation and she did receive a dose post-procedure -currently hemodynamically stable and PE us  segmental, no indication for lytics or IR consult  -trend trop and check BNP, lactic acid cleared  -continue zosyn -start yupelri , brovana , pulmicort  -had a thoracentesis recently that was transudative and cytology was negative     S/p Afib ablation 04/28/2024 PPM, HTN, dyslipidemia -Hold BP meds for now s/p ablation  Slightly elevated LFT -Monitor    HyperK -shifted in ED and improved to 5.3, cont to monitor    AKI -follow renal indices, UOP and avoid  nephrotoxins    GERD  -H2B  Metabolic encephalopathy due to septic shock and PNA  Best Practice (right click and Reselect all SmartList Selections daily)   Diet/type: NPO w/ oral meds DVT prophylaxis systemic heparin  Pressure ulcer(s): N/A GI prophylaxis: H2B Lines: N/A Foley:  Yes, and it is still needed Code Status:  full code  Last date of multidisciplinary goals of care discussion [family updated at the bedside 6/11]  Labs   CBC: Recent Labs  Lab 04/29/24 0028 04/29/24 0031 04/29/24 0428  WBC  --  15.6*  --   NEUTROABS  --  10.6*  --   HGB 13.6 11.9* 11.6*  HCT 40.0 40.6 34.0*  MCV  --  92.7  --   PLT  --  309  --     Basic Metabolic Panel: Recent Labs  Lab 04/29/24 0028 04/29/24 0031 04/29/24 0428 04/29/24 0551  NA 137 135 136 138  K 5.9* 5.9* 4.4 5.3*  CL 99 101  --  103  CO2  --  26   --  24  GLUCOSE 204* 205*  --  138*  BUN 26* 22  --  19  CREATININE 1.20* 1.20*  --  1.04*  CALCIUM  --  8.7*  --  8.9  MG  --  2.2  --   --    GFR: Estimated Creatinine Clearance: 39.5 mL/min (A) (by C-G formula based on SCr of 1.04 mg/dL (H)). Recent Labs  Lab 04/29/24 0029 04/29/24 0031 04/29/24 0553  WBC  --  15.6*  --   LATICACIDVEN 2.2*  --  1.8    Liver Function Tests: Recent Labs  Lab 04/29/24 0031  AST 66*  ALT 63*  ALKPHOS 97  BILITOT 0.6  PROT 6.0*  ALBUMIN 3.4*   Recent Labs  Lab 04/29/24 0031  LIPASE 37   No results for input(s): AMMONIA in the last 168 hours.  ABG    Component Value Date/Time   PHART 7.337 (L) 04/29/2024 0428   PCO2ART 46.7 04/29/2024 0428   PO2ART 156 (H) 04/29/2024 0428   HCO3 24.9 04/29/2024 0428   TCO2 26 04/29/2024 0428   ACIDBASEDEF 1.0 04/29/2024 0428   O2SAT 99 04/29/2024 0428     Coagulation Profile: No results for input(s): INR, PROTIME in the last 168 hours.  Cardiac Enzymes: No results for input(s): CKTOTAL, CKMB, CKMBINDEX, TROPONINI in the last 168 hours.  HbA1C: Hgb A1c MFr Bld  Date/Time Value Ref Range Status  09/13/2020 12:05 PM 5.8 (H) 4.8 - 5.6 % Final    Comment:    (NOTE)         Prediabetes: 5.7 - 6.4         Diabetes: >6.4         Glycemic control for adults with diabetes: <7.0   07/08/2020 11:30 AM 5.6 4.8 - 5.6 % Final    Comment:             Prediabetes: 5.7 - 6.4          Diabetes: >6.4          Glycemic control for adults with diabetes: <7.0     CBG: Recent Labs  Lab 04/29/24 0343 04/29/24 0738  GLUCAP 123* 115*    Review of Systems:   Intubated   Past Medical History:  She,  has a past medical history of Arthritis, Atrial fibrillation, transient (HCC), Closed fracture of right fibula and tibia (01/13/2023), Diverticulitis, Dyspnea, GERD (gastroesophageal reflux disease), Hyperlipidemia, Hypertension, Stroke (HCC), and TIA (transient ischemic attack).    Surgical History:   Past Surgical History:  Procedure Laterality Date   AV NODE ABLATION N/A 04/28/2024   Procedure: AV NODE ABLATION;  Surgeon: Efraim Grange, MD;  Location: [facility];  Service: Cardiovascular;  Laterality: N/A;   CARDIOVERSION N/A 01/21/2024   Procedure:  CARDIOVERSION;  Surgeon: Olinda Bertrand, DO;  Location: [facility];  Service: Cardiovascular;  Laterality: N/A;   CARDIOVERSION N/A 02/11/2024   Procedure: CARDIOVERSION;  Surgeon: Hugh Madura, MD;  Location: [facility];  Service: Cardiovascular;  Laterality: N/A;   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/13/2020   Procedure: CONVERSION TO LEFT TOTAL HIP-POSTERIOR APPROACH;  Surgeon: Claiborne Crew, MD;  Location: [facility];  Service: Orthopedics;  Laterality: Left;  90 mins   HIP FRACTURE SURGERY Left 04/16/2020   PACEMAKER IMPLANT N/A 03/16/2024   Procedure: PACEMAKER IMPLANT;  Surgeon: Efraim Grange, MD;  Location: [facility];  Service: Cardiovascular;  Laterality: N/A;   TIBIA IM NAIL INSERTION Right 01/15/2023   Procedure: INTRAMEDULLARY (IM) NAIL  RIGHT TIBIAL;  Surgeon: Hardy Lia, MD;  Location: [facility];  Service: Orthopedics;  Laterality: Right;     Social History:   reports that she has never smoked. She has never used smokeless tobacco. She reports that she does not drink alcohol and does not use drugs.   Family History:  Her family history includes Diabetes in her mother.   Allergies Allergies  Allergen Reactions   Keflex [Cephalexin] Other (See Comments)    makes pt feel really bad  Tolerated Cephalosporin Date: 01/15/23.     Morphine  Anxiety and Other (See Comments)    Pt states this made her feel like she was going to die     Home Medications  Prior to Admission medications   Medication Sig Start Date End Date Taking? Authorizing Provider  acetaminophen  (TYLENOL ) 500 MG tablet Take 2 tablets (1,000 mg total) by mouth every 8 (eight) hours. 09/16/20  Yes  Babish, Zoila Hines, PA-C  albuterol  (VENTOLIN  HFA) 108 (90 Base) MCG/ACT inhaler Inhale 2 puffs into the lungs every 4 (four) hours as needed for wheezing or shortness of breath. 06/08/21  Yes [provider]  apixaban  (ELIQUIS ) 5 MG TABS tablet Take 1 tablet (5 mg total) by mouth 2 (two) times daily. 03/20/24  Yes Mealor, Augustus E, MD  Ascorbic Acid  (VITAMIN C ) 1000 MG tablet Take 1,000 mg by mouth daily.   Yes [provider]  cetirizine (ZYRTEC) 10 MG tablet Take 10 mg by mouth daily.    Yes [provider]  Cholecalciferol  (VITAMIN D3) 50 MCG (2000 UT) TABS Take 2,000 Units by mouth daily.   Yes [provider]  cyanocobalamin  1000 MCG tablet Take 1,000 mcg by mouth every other day. Vitamin B12   Yes [provider]  diclofenac  Sodium (VOLTAREN ) 1 % GEL Apply 2 g topically daily as needed (joint pain).   Yes [provider]  diltiazem  (CARDIZEM  CD) 300 MG 24 hr capsule Take 300 mg by mouth daily.   Yes [provider]  diltiazem  (CARDIZEM ) 30 MG tablet Take 30 mg by mouth as directed. Take one (30mg ) capsule by mouth as needed for A-Fib. 03/19/24  Yes [provider]  escitalopram (LEXAPRO) 5 MG tablet Take 5 mg by mouth daily. 03/19/24  Yes [provider]  ferrous sulfate  325 (65 FE) MG tablet Take 325 mg by mouth 2 (two) times daily with a meal.   Yes [provider]  furosemide  (LASIX ) 80 MG tablet Take 1 tablet (80 mg total) by mouth daily. 01/28/24 09/18/24 Yes Croitoru, Mihai, MD  gabapentin  (NEURONTIN ) 300 MG capsule Take 300-600 mg by mouth See admin instructions. Take one capsule (300 mg) in the morning and two capsules (600 mg) in the evening.  Yes [provider]  hydroxypropyl methylcellulose / hypromellose (ISOPTO TEARS / GONIOVISC) 2.5 % ophthalmic solution Place 1 drop into both eyes daily as needed for dry eyes.   Yes [provider]  ipratropium-albuterol  (DUONEB) 0.5-2.5 (3)  MG/3ML SOLN Take 3 mLs by nebulization every 6 (six) hours as needed (shortness of breath). 12/04/22  Yes Leona Rake, MD  labetalol  (NORMODYNE ) 200 MG tablet Take 200 mg by mouth 2 (two) times daily.   Yes [provider]  Magnesium  400 MG TABS Take 400 mg by mouth daily.   Yes [provider]  methocarbamol  (ROBAXIN ) 750 MG tablet Take 750-1,500 mg by mouth See admin instructions. Take 750 mg in the morning and 1500 mg in the PM 03/06/22  Yes [provider]  mirtazapine  (REMERON ) 15 MG tablet Take 15 mg by mouth at bedtime. 02/27/22  Yes [provider]  Multiple Vitamin (MULTIVITAMIN WITH MINERALS) TABS tablet Take 2 tablets by mouth daily. Plus  Omega 3 gummy   Yes [provider]  Multiple Vitamins-Minerals (PRESERVISION AREDS 2 PO) Take 1 tablet by mouth 2 (two) times daily. [employer], Historical, MD  Omega-3 Fatty Acids (OMEGA-3 FISH OIL) 1200 MG CAPS Take 1,200 mg by mouth daily.   Yes [provider]  pantoprazole  (PROTONIX ) 40 MG tablet Take 40 mg by mouth daily.   Yes [provider]  potassium chloride  SA (KLOR-CON  M20) 20 MEQ tablet Take 1 tablet (20 mEq total) by mouth 3 (three) times daily. Patient taking differently: Take 20 mEq by mouth 2 (two) times daily. 02/19/24  Yes Fenton, Clint R, PA  pravastatin  (PRAVACHOL ) 20 MG tablet Take 20 mg by mouth at bedtime.   Yes [provider]  sucralfate  (CARAFATE ) 1 g tablet Take 1 g by mouth with breakfast, with lunch, and with evening meal.   Yes [provider]     Critical care time: 35 min    CRITICAL CARE Performed by: Patt Boozer Karia Ehresman   Total critical care time: 35 minutes  Critical care time was exclusive of separately billable procedures and treating other patients.  Critical care was necessary to treat or prevent imminent or life-threatening deterioration.  Critical care was time spent personally by me on the following  activities: development of treatment plan with patient and/or surrogate as well as nursing, discussions with consultants, evaluation of patient's response to treatment, examination of patient, obtaining history from patient or surrogate, ordering and performing treatments and interventions, ordering and review of laboratory studies, ordering and review of radiographic studies, pulse oximetry and re-evaluation of patient's condition.  Patt Boozer Teresea Donley, PA-C [facility] See Amion for pager If no response to pager , please call [phone] until 7pm After 7:00 pm call Elink  [phone]

## 2024-04-29 NOTE — Progress Notes (Signed)
 PHARMACY - ANTICOAGULATION CONSULT NOTE  Pharmacy Consult for Heparin   Indication: pulmonary embolus  Allergies  Allergen Reactions   Keflex [Cephalexin] Other (See Comments)    makes pt feel really bad  Tolerated Cephalosporin Date: 01/15/23.     Morphine  Anxiety and Other (See Comments)    Pt states this made her feel like she was going to die    Patient Measurements: Height: 5' 3 (160 cm) Weight: 91.9 kg (202 lb 9.6 oz) IBW/kg (Calculated) : 52.4 HEPARIN  DW (KG): 73.4  Vital Signs: Temp: 99.7 F (37.6 C) (06/11 0700) Temp Source: Bladder (06/11 0215) BP: 138/77 (06/11 0700) Pulse Rate: 89 (06/11 0700)  Labs: Recent Labs    04/29/24 0028 04/29/24 0031 04/29/24 0428 04/29/24 0551  HGB 13.6 11.9* 11.6*  --   HCT 40.0 40.6 34.0*  --   PLT  --  309  --   --   CREATININE 1.20* 1.20*  --  1.04*  TROPONINIHS  --  94*  --  136*    Estimated Creatinine Clearance: 39.5 mL/min (A) (by C-G formula based on SCr of 1.04 mg/dL (H)).   Medical History: Past Medical History:  Diagnosis Date   Arthritis    Atrial fibrillation, transient (HCC)    post op hip surgery; May 2021   Closed fracture of right fibula and tibia 01/13/2023   Diverticulitis    Dyspnea    with exertion    GERD (gastroesophageal reflux disease)    Hyperlipidemia    Hypertension    Stroke (HCC)    hx opf mini strokes    TIA (transient ischemic attack)     Medications:  Medications Prior to Admission  Medication Sig Dispense Refill Last Dose/Taking   acetaminophen  (TYLENOL ) 500 MG tablet Take 2 tablets (1,000 mg total) by mouth every 8 (eight) hours. 30 tablet 0 04/28/2024 Bedtime   albuterol  (VENTOLIN  HFA) 108 (90 Base) MCG/ACT inhaler Inhale 2 puffs into the lungs every 4 (four) hours as needed for wheezing or shortness of breath.   04/28/2024 Morning   apixaban  (ELIQUIS ) 5 MG TABS tablet Take 1 tablet (5 mg total) by mouth 2 (two) times daily. 180 tablet 1 04/28/2024 at  7:00 PM   Ascorbic  Acid (VITAMIN C ) 1000 MG tablet Take 1,000 mg by mouth daily.   Past Week   cetirizine (ZYRTEC) 10 MG tablet Take 10 mg by mouth daily.    Past Week   Cholecalciferol  (VITAMIN D3) 50 MCG (2000 UT) TABS Take 2,000 Units by mouth daily.   Past Week   cyanocobalamin  1000 MCG tablet Take 1,000 mcg by mouth every other day. Vitamin B12   Past Week   diclofenac  Sodium (VOLTAREN ) 1 % GEL Apply 2 g topically daily as needed (joint pain).   04/28/2024 Bedtime   diltiazem  (CARDIZEM  CD) 300 MG 24 hr capsule Take 300 mg by mouth daily.   Past Week   diltiazem  (CARDIZEM ) 30 MG tablet Take 30 mg by mouth as directed. Take one (30mg ) capsule by mouth as needed for A-Fib.   Past Week   escitalopram (LEXAPRO) 5 MG tablet Take 5 mg by mouth daily.   Past Week   ferrous sulfate  325 (65 FE) MG tablet Take 325 mg by mouth 2 (two) times daily with a meal.   04/28/2024 Evening   furosemide  (LASIX ) 80 MG tablet Take 1 tablet (80 mg total) by mouth daily. 90 tablet 3 Past Week   gabapentin  (NEURONTIN ) 300 MG capsule Take 300-600 mg by  mouth See admin instructions. Take one capsule (300 mg) in the morning and two capsules (600 mg) in the evening.   04/28/2024 Evening   hydroxypropyl methylcellulose / hypromellose (ISOPTO TEARS / GONIOVISC) 2.5 % ophthalmic solution Place 1 drop into both eyes daily as needed for dry eyes.   Unknown   ipratropium-albuterol  (DUONEB) 0.5-2.5 (3) MG/3ML SOLN Take 3 mLs by nebulization every 6 (six) hours as needed (shortness of breath). 360 mL 0 04/28/2024 Morning   labetalol  (NORMODYNE ) 200 MG tablet Take 200 mg by mouth 2 (two) times daily.   04/28/2024 Evening   Magnesium  400 MG TABS Take 400 mg by mouth daily.   Past Week   methocarbamol  (ROBAXIN ) 750 MG tablet Take 750-1,500 mg by mouth See admin instructions. Take 750 mg in the morning and 1500 mg in the PM   04/28/2024 Evening   mirtazapine  (REMERON ) 15 MG tablet Take 15 mg by mouth at bedtime.   Past Week   Multiple Vitamin (MULTIVITAMIN WITH  MINERALS) TABS tablet Take 2 tablets by mouth daily. Plus  Omega 3 gummy   Past Week   Multiple Vitamins-Minerals (PRESERVISION AREDS 2 PO) Take 1 tablet by mouth 2 (two) times daily. [facility]   04/28/2024 Evening   Omega-3 Fatty Acids (OMEGA-3 FISH OIL) 1200 MG CAPS Take 1,200 mg by mouth daily.   Past Week   pantoprazole  (PROTONIX ) 40 MG tablet Take 40 mg by mouth daily.   Past Week   potassium chloride  SA (KLOR-CON  M20) 20 MEQ tablet Take 1 tablet (20 mEq total) by mouth 3 (three) times daily. (Patient taking differently: Take 20 mEq by mouth 2 (two) times daily.) 90 tablet 3 04/28/2024 Evening   pravastatin  (PRAVACHOL ) 20 MG tablet Take 20 mg by mouth at bedtime.   04/28/2024 Bedtime   sucralfate  (CARAFATE ) 1 g tablet Take 1 g by mouth with breakfast, with lunch, and with evening meal.   Past Week   Scheduled:   Chlorhexidine  Gluconate Cloth  6 each Topical Daily   famotidine  20 mg Oral Daily   fentaNYL  (SUBLIMAZE ) injection  25-50 mcg Intravenous Once   insulin aspart  0-6 Units Subcutaneous Q4H   ipratropium-albuterol   3 mL Nebulization QID   mouth rinse  15 mL Mouth Rinse Q2H   Infusions:   sodium chloride  250 mL (04/29/24 0250)   fentaNYL  infusion INTRAVENOUS 50 mcg/hr (04/29/24 0936)   heparin      norepinephrine (LEVOPHED) Adult infusion 3 mcg/min (04/29/24 0045)   piperacillin-tazobactam (ZOSYN)  IV 3.375 g (04/29/24 1610)    Assessment: Patient on Eliquis  prior to admission (Atrial fibrillation s/p ablation on 6/10). Last dose of Eliquis  6/10 PM. CT PE positive for nonocclusive segmental emboli in the right middle lobe pulmonary arteries. RV/LV ratio is increased at 1.3. Pharmacy consulted to dose heparin .   Goal of Therapy:  Heparin  level 0.3-0.7 units/ml aPTT 66-102 seconds Monitor platelets by anticoagulation protocol: Yes   Plan:  Start heparin  infusion at 1300 units/hr Check anti-Xa level and aPTT in 8 hours and daily while on heparin  until correlating given  recent Eliquis  exposure  Continue to monitor H&H and platelets  Patience Bonito 04/29/2024,9:55 AM

## 2024-04-29 NOTE — Progress Notes (Signed)
 Bilateral lower ext venous duplex  has been completed. Refer to [facility] under chart review to view preliminary results.   04/29/2024  3:02 PM [name], Hollace Lund

## 2024-04-29 NOTE — Telephone Encounter (Signed)
 Planned attempt to reach patient to follow up on AVN ablation procedure completed on 04/28/24. Noted patient became unresponsive last night at home and transported via EMS to the hospital and currently admitted for altered mental status, hyperkalemia, suspected aspiration pneumonia, and intubated for airway protection. Dr. Arlester Ladd made aware of admission.

## 2024-04-29 NOTE — Progress Notes (Signed)
 Holding off on artline and abg at this time per MD. Will wean fio2 according to sats per MD.

## 2024-04-29 NOTE — Progress Notes (Addendum)
 PHARMACY - ANTICOAGULATION CONSULT NOTE  Pharmacy Consult for Heparin   Indication: pulmonary embolus  Allergies  Allergen Reactions   Keflex [Cephalexin] Other (See Comments)    makes pt feel really bad  Tolerated Cephalosporin Date: 01/15/23.     Morphine  Anxiety and Other (See Comments)    Pt states this made her feel like she was going to die    Patient Measurements: Height: 5' 3 (160 cm) Weight: 91.9 kg (202 lb 9.6 oz) IBW/kg (Calculated) : 52.4 HEPARIN  DW (KG): 73.4  Vital Signs: Temp: 100.4 F (38 C) (06/11 1900) BP: 104/59 (06/11 1845) Pulse Rate: 90 (06/11 2020)  Labs: Recent Labs    04/29/24 0031 04/29/24 0428 04/29/24 0551 04/29/24 1003 04/29/24 1151 04/29/24 1539 04/29/24 1804 04/29/24 1937  HGB 11.9* 11.6*  --   --   --  10.9*  --   --   HCT 40.6 34.0*  --   --   --  35.4*  --   --   PLT 309  --   --   --   --  243  --   --   APTT  --   --   --   --   --   --   --  94*  HEPARINUNFRC  --   --   --   --   --   --  >1.10*  --   CREATININE 1.20*  --  1.04*  --   --  1.15*  --   --   TROPONINIHS 94*  --  136* 162* 125*  --   --   --     Estimated Creatinine Clearance: 35.7 mL/min (A) (by C-G formula based on SCr of 1.15 mg/dL (H)).   Medical History: Past Medical History:  Diagnosis Date   Arthritis    Atrial fibrillation, transient (HCC)    post op hip surgery; May 2021   Closed fracture of right fibula and tibia 01/13/2023   Diverticulitis    Dyspnea    with exertion    GERD (gastroesophageal reflux disease)    Hyperlipidemia    Hypertension    Stroke (HCC)    hx opf mini strokes    TIA (transient ischemic attack)     Medications:  Medications Prior to Admission  Medication Sig Dispense Refill Last Dose/Taking   acetaminophen  (TYLENOL ) 500 MG tablet Take 2 tablets (1,000 mg total) by mouth every 8 (eight) hours. 30 tablet 0 04/28/2024 Bedtime   albuterol  (VENTOLIN  HFA) 108 (90 Base) MCG/ACT inhaler Inhale 2 puffs into the lungs  every 4 (four) hours as needed for wheezing or shortness of breath.   04/28/2024 Morning   apixaban  (ELIQUIS ) 5 MG TABS tablet Take 1 tablet (5 mg total) by mouth 2 (two) times daily. 180 tablet 1 04/28/2024 at  7:00 PM   Ascorbic Acid  (VITAMIN C ) 1000 MG tablet Take 1,000 mg by mouth daily.   Past Week   cetirizine (ZYRTEC) 10 MG tablet Take 10 mg by mouth daily.    Past Week   Cholecalciferol  (VITAMIN D3) 50 MCG (2000 UT) TABS Take 2,000 Units by mouth daily.   Past Week   cyanocobalamin  1000 MCG tablet Take 1,000 mcg by mouth every other day. Vitamin B12   Past Week   diclofenac  Sodium (VOLTAREN ) 1 % GEL Apply 2 g topically daily as needed (joint pain).   04/28/2024 Bedtime   diltiazem  (CARDIZEM  CD) 300 MG 24 hr capsule Take 300 mg by mouth daily.  Past Week   diltiazem  (CARDIZEM ) 30 MG tablet Take 30 mg by mouth as directed. Take one (30mg ) capsule by mouth as needed for A-Fib.   Past Week   escitalopram (LEXAPRO) 5 MG tablet Take 5 mg by mouth daily.   Past Week   ferrous sulfate  325 (65 FE) MG tablet Take 325 mg by mouth 2 (two) times daily with a meal.   04/28/2024 Evening   furosemide  (LASIX ) 80 MG tablet Take 1 tablet (80 mg total) by mouth daily. 90 tablet 3 Past Week   gabapentin  (NEURONTIN ) 300 MG capsule Take 300-600 mg by mouth See admin instructions. Take one capsule (300 mg) in the morning and two capsules (600 mg) in the evening.   04/28/2024 Evening   hydroxypropyl methylcellulose / hypromellose (ISOPTO TEARS / GONIOVISC) 2.5 % ophthalmic solution Place 1 drop into both eyes daily as needed for dry eyes.   Unknown   ipratropium-albuterol  (DUONEB) 0.5-2.5 (3) MG/3ML SOLN Take 3 mLs by nebulization every 6 (six) hours as needed (shortness of breath). 360 mL 0 04/28/2024 Morning   labetalol  (NORMODYNE ) 200 MG tablet Take 200 mg by mouth 2 (two) times daily.   04/28/2024 Evening   Magnesium  400 MG TABS Take 400 mg by mouth daily.   Past Week   methocarbamol  (ROBAXIN ) 750 MG tablet Take  750-1,500 mg by mouth See admin instructions. Take 750 mg in the morning and 1500 mg in the PM   04/28/2024 Evening   mirtazapine  (REMERON ) 15 MG tablet Take 15 mg by mouth at bedtime.   Past Week   Multiple Vitamin (MULTIVITAMIN WITH MINERALS) TABS tablet Take 2 tablets by mouth daily. Plus  Omega 3 gummy   Past Week   Multiple Vitamins-Minerals (PRESERVISION AREDS 2 PO) Take 1 tablet by mouth 2 (two) times daily. [facility]   04/28/2024 Evening   Omega-3 Fatty Acids (OMEGA-3 FISH OIL) 1200 MG CAPS Take 1,200 mg by mouth daily.   Past Week   pantoprazole  (PROTONIX ) 40 MG tablet Take 40 mg by mouth daily.   Past Week   potassium chloride  SA (KLOR-CON  M20) 20 MEQ tablet Take 1 tablet (20 mEq total) by mouth 3 (three) times daily. (Patient taking differently: Take 20 mEq by mouth 2 (two) times daily.) 90 tablet 3 04/28/2024 Evening   pravastatin  (PRAVACHOL ) 20 MG tablet Take 20 mg by mouth at bedtime.   04/28/2024 Bedtime   sucralfate  (CARAFATE ) 1 g tablet Take 1 g by mouth with breakfast, with lunch, and with evening meal.   Past Week   Scheduled:   arformoterol   15 mcg Nebulization BID   budesonide  (PULMICORT ) nebulizer solution  0.25 mg Nebulization BID   Chlorhexidine  Gluconate Cloth  6 each Topical Daily   [START ON 04/30/2024] famotidine  20 mg Per Tube Daily   fentaNYL  (SUBLIMAZE ) injection  25-50 mcg Intravenous Once   insulin aspart  0-6 Units Subcutaneous Q4H   mouth rinse  15 mL Mouth Rinse Q2H   revefenacin   175 mcg Nebulization Daily   Infusions:   sodium chloride  50 mL/hr at 04/29/24 1928   fentaNYL  infusion INTRAVENOUS 125 mcg/hr (04/29/24 2034)   heparin  1,300 Units/hr (04/29/24 1928)   norepinephrine (LEVOPHED) Adult infusion Stopped (04/29/24 0620)   piperacillin-tazobactam (ZOSYN)  IV Stopped (04/29/24 1800)   propofol  (DIPRIVAN ) infusion 10 mcg/kg/min (04/29/24 1928)    Assessment: Patient on Eliquis  prior to admission (Atrial fibrillation s/p ablation on 6/10). Last  dose of Eliquis  6/10 PM. CT PE positive for nonocclusive  segmental emboli in the right middle lobe pulmonary arteries. RV/LV ratio is increased at 1.3. Pharmacy consulted to dose heparin .   6/11 PM - aPTT draw delayed - issue obtaining enough blood.  Redraw aPTT 94 sec is therapeutic on 1300 units/hr; heparin  level >1.1 impacted by recent DOAC.    Goal of Therapy:  Heparin  level 0.3-0.7 units/ml aPTT 66-102 seconds Monitor platelets by anticoagulation protocol: Yes   Plan:  Continue heparin  infusion at 1300 units/hr Daily heparin  level and aPTT's while on heparin  until correlating given recent Eliquis  exposure  Continue to monitor H&H and platelets  Cecillia Cogan, PharmD Clinical Pharmacist 04/29/2024  8:46 PM

## 2024-04-29 NOTE — Progress Notes (Signed)
*  PRELIMINARY RESULTS* Echocardiogram 2D Echocardiogram has been performed.  Glenna Lango 04/29/2024, 12:45 PM

## 2024-04-29 NOTE — Progress Notes (Signed)
 ED Pharmacy Antibiotic Sign Off An antibiotic consult was received from an ED provider for vancomycin per pharmacy dosing for sepsis. A chart review was completed to assess appropriateness.   The following one time order(s) were placed:   -Vancomycin 2g IV x1  Further antibiotic and/or antibiotic pharmacy consults should be ordered by the admitting provider if indicated.   Thank you for allowing pharmacy to be a part of this patient's care.   Young Hensen, [facility]  Clinical Pharmacist 04/29/24 1:26 AM

## 2024-04-29 NOTE — H&P (Addendum)
 NAMEFloride [name], MRN:  [id number], DOB:  1933/08/08, LOS: 0 ADMISSION DATE:  04/29/2024, CONSULTATION DATE: 04/29/2024 REFERRING MD: Onetha Bile, MD, CHIEF COMPLAINT: AMS for 4 hrs  History of Present Illness:  An 89 yr old female patient with PPM (8 weeks ago), PAF (failed cardioversion twice in March 2025), HTN, COPD, GERD, TIA, and hyperlipidemia. She underwent Afib ablation yesterday noon time and discharged home. At 9:15 pm, she was weak, moaning, altered, and had a weak pulse and syncope. She never lost a pulse or had apnea. EMS arrived and she was intubated due to low GCS 4. Amiodarone  was d/c 2 months ago. Compliant with meds. No hx of active smoking (but second hand smoking), illicit drug use, or alcohol drinking. Given one dose of Vanco and Zosyn. Started on Levophed due to hypotension. Medical Rx for hyperkalemia was given in ED.   Pertinent  Medical History  PPM (April 2025), PAF (failed cardioversion twice March 2025), HTN, COPD, GERD, TIA, hyperlipidemia Significant Hospital Events: Including procedures, antibiotic start and stop dates in addition to other pertinent events   6/11: intubated, seen in ED, admit to ICU  Interim History / Subjective:    Objective    Blood pressure 116/68, pulse 89, temperature 98.4 F (36.9 C), temperature source Bladder, resp. rate 18, SpO2 100%.    Vent Mode: PRVC FiO2 (%):  [80 %-100 %] 80 % Set Rate:  [18 bmp] 18 bmp Vt Set:  [500 mL] 500 mL PEEP:  [5 cmH20] 5 cmH20 Plateau Pressure:  [31 cmH20] 31 cmH20  No intake or output data in the 24 hours ending 04/29/24 0205 There were no vitals filed for this visit.  Examination: General: intubated with ETT 7.5, 22 cm @ teeth. SpO2 100%. Paced, MAP 74 mmHg @ Levophed 3 mcg/min. Afebrile.   HENT: PERL. No LNE or thyromegaly. No JVD Lungs: symmetrical air entry bilaterally. No crackles or wheezing Cardiovascular: NL S1/S2. No m/g/r Abdomen: no distension or tenderness Extremities:  trace edema. Symmetrical. Right distal anterior leg hyperpigmentation due to old healed ulcer.  Neuro: intubated    Resolved problem list   Assessment and Plan  Acute hypoxic resp failure due to aspiration PNA -Admit to ICU -ABG -Cx -Vanco x1 -Zosyn -MRSA screening -Urine Ags -Duoneb -Serial Trop -Am labs -VAP and DAP protocols  Feb 2025, Echo: EF 55%, dilated LA. RVSP 37.6 mmHg    Residual small right pleural effusion (had thoracentesis in the past)   S/p Afib ablation 04/28/2024 PPM, HTN, dyslipidemia -Hold BP meds for now  Slightly elevated LFT -Monitor  Type 2 Trop elevation due to septic shock -Serial Trop   HyperK -s/p Rx in ED -f/u BMP  AKI -IVF -am labs -Avoid nephrotoxins   Lactic acidosis due to septic shock -Serial LA -IVF -I/O chart -Levophed for MAP >65 mmHg -Glycemic control  COPD -Duoneb   GERD  -H2B  Metabolic encephalopathy due to septic shock and PNA  Best Practice (right click and Reselect all SmartList Selections daily)   Diet/type: NPO w/ oral meds DVT prophylaxis prophylactic heparin   Pressure ulcer(s): N/A GI prophylaxis: H2B Lines: N/A Foley:  Yes, and it is still needed Code Status:  full code Last date of multidisciplinary goals of care discussion []   Labs   CBC: Recent Labs  Lab 04/29/24 0028 04/29/24 0031  WBC  --  15.6*  NEUTROABS  --  10.6*  HGB 13.6 11.9*  HCT 40.0 40.6  MCV  --  92.7  PLT  --  309    Basic Metabolic Panel: Recent Labs  Lab 04/29/24 0028 04/29/24 0031  NA 137 135  K 5.9* 5.9*  CL 99 101  CO2  --  26  GLUCOSE 204* 205*  BUN 26* 22  CREATININE 1.20* 1.20*  CALCIUM  --  8.7*  MG  --  2.2   GFR: Estimated Creatinine Clearance: 34 mL/min (A) (by C-G formula based on SCr of 1.2 mg/dL (H)). Recent Labs  Lab 04/29/24 0029 04/29/24 0031  WBC  --  15.6*  LATICACIDVEN 2.2*  --     Liver Function Tests: Recent Labs  Lab 04/29/24 0031  AST 66*  ALT 63*  ALKPHOS 97   BILITOT 0.6  PROT 6.0*  ALBUMIN 3.4*   Recent Labs  Lab 04/29/24 0031  LIPASE 37   No results for input(s): AMMONIA in the last 168 hours.  ABG    Component Value Date/Time   TCO2 29 04/29/2024 0028     Coagulation Profile: No results for input(s): INR, PROTIME in the last 168 hours.  Cardiac Enzymes: No results for input(s): CKTOTAL, CKMB, CKMBINDEX, TROPONINI in the last 168 hours.  HbA1C: Hgb A1c MFr Bld  Date/Time Value Ref Range Status  09/13/2020 12:05 PM 5.8 (H) 4.8 - 5.6 % Final    Comment:    (NOTE)         Prediabetes: 5.7 - 6.4         Diabetes: >6.4         Glycemic control for adults with diabetes: <7.0   07/08/2020 11:30 AM 5.6 4.8 - 5.6 % Final    Comment:             Prediabetes: 5.7 - 6.4          Diabetes: >6.4          Glycemic control for adults with diabetes: <7.0     CBG: No results for input(s): GLUCAP in the last 168 hours.  Review of Systems:   Intubated   Past Medical History:  She,  has a past medical history of Arthritis, Atrial fibrillation, transient (HCC), Closed fracture of right fibula and tibia (01/13/2023), Diverticulitis, Dyspnea, GERD (gastroesophageal reflux disease), Hyperlipidemia, Hypertension, Stroke (HCC), and TIA (transient ischemic attack).   Surgical History:   Past Surgical History:  Procedure Laterality Date   AV NODE ABLATION N/A 04/28/2024   Procedure: AV NODE ABLATION;  Surgeon: Efraim Grange, MD;  Location: [facility];  Service: Cardiovascular;  Laterality: N/A;   CARDIOVERSION N/A 01/21/2024   Procedure: CARDIOVERSION;  Surgeon: Olinda Bertrand, DO;  Location: [facility];  Service: Cardiovascular;  Laterality: N/A;   CARDIOVERSION N/A 02/11/2024   Procedure: CARDIOVERSION;  Surgeon: Hugh Madura, MD;  Location: [facility];  Service: Cardiovascular;  Laterality: N/A;   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/13/2020   Procedure: CONVERSION TO LEFT TOTAL HIP-POSTERIOR  APPROACH;  Surgeon: Claiborne Crew, MD;  Location: [facility];  Service: Orthopedics;  Laterality: Left;  90 mins   HIP FRACTURE SURGERY Left 04/16/2020   PACEMAKER IMPLANT N/A 03/16/2024   Procedure: PACEMAKER IMPLANT;  Surgeon: Efraim Grange, MD;  Location: [facility];  Service: Cardiovascular;  Laterality: N/A;   TIBIA IM NAIL INSERTION Right 01/15/2023   Procedure: INTRAMEDULLARY (IM) NAIL  RIGHT TIBIAL;  Surgeon: Hardy Lia, MD;  Location: [facility];  Service: Orthopedics;  Laterality: Right;     Social History:   reports that she has  never smoked. She has never used smokeless tobacco. She reports that she does not drink alcohol and does not use drugs.   Family History:  Her family history includes Diabetes in her mother.   Allergies Allergies  Allergen Reactions   Keflex [Cephalexin] Other (See Comments)    makes pt feel really bad  Tolerated Cephalosporin Date: 01/15/23.     Morphine  Anxiety and Other (See Comments)    Pt states this made her feel like she was going to die     Home Medications  Prior to Admission medications   Medication Sig Start Date End Date Taking? Authorizing Provider  acetaminophen  (TYLENOL ) 500 MG tablet Take 2 tablets (1,000 mg total) by mouth every 8 (eight) hours. 09/16/20  Yes Babish, Zoila Hines, PA-C  albuterol  (VENTOLIN  HFA) 108 (90 Base) MCG/ACT inhaler Inhale 2 puffs into the lungs every 4 (four) hours as needed for wheezing or shortness of breath. 06/08/21  Yes [provider]  apixaban  (ELIQUIS ) 5 MG TABS tablet Take 1 tablet (5 mg total) by mouth 2 (two) times daily. 03/20/24  Yes Mealor, Augustus E, MD  Ascorbic Acid  (VITAMIN C ) 1000 MG tablet Take 1,000 mg by mouth daily.   Yes [provider]  cetirizine (ZYRTEC) 10 MG tablet Take 10 mg by mouth daily.    Yes [provider]  Cholecalciferol  (VITAMIN D3) 50 MCG (2000 UT) TABS Take 2,000 Units by mouth daily.   Yes [provider]  cyanocobalamin   1000 MCG tablet Take 1,000 mcg by mouth every other day. Vitamin B12   Yes [provider]  diclofenac  Sodium (VOLTAREN ) 1 % GEL Apply 2 g topically daily as needed (joint pain).   Yes [provider]  diltiazem  (CARDIZEM  CD) 300 MG 24 hr capsule Take 300 mg by mouth daily.   Yes [provider]  diltiazem  (CARDIZEM ) 30 MG tablet Take 30 mg by mouth as directed. Take one (30mg ) capsule by mouth as needed for A-Fib. 03/19/24  Yes [provider]  escitalopram (LEXAPRO) 5 MG tablet Take 5 mg by mouth daily. 03/19/24  Yes [provider]  ferrous sulfate  325 (65 FE) MG tablet Take 325 mg by mouth 2 (two) times daily with a meal.   Yes [provider]  furosemide  (LASIX ) 80 MG tablet Take 1 tablet (80 mg total) by mouth daily. 01/28/24 09/18/24 Yes Croitoru, Mihai, MD  gabapentin  (NEURONTIN ) 300 MG capsule Take 300-600 mg by mouth See admin instructions. Take one capsule (300 mg) in the morning and two capsules (600 mg) in the evening.   Yes [provider]  hydroxypropyl methylcellulose / hypromellose (ISOPTO TEARS / GONIOVISC) 2.5 % ophthalmic solution Place 1 drop into both eyes daily as needed for dry eyes.   Yes [provider]  ipratropium-albuterol  (DUONEB) 0.5-2.5 (3) MG/3ML SOLN Take 3 mLs by nebulization every 6 (six) hours as needed (shortness of breath). 12/04/22  Yes Leona Rake, MD  labetalol  (NORMODYNE ) 200 MG tablet Take 200 mg by mouth 2 (two) times daily.   Yes [provider]  Magnesium  400 MG TABS Take 400 mg by mouth daily.   Yes [provider]  methocarbamol  (ROBAXIN ) 750 MG tablet Take 750-1,500 mg by mouth See admin instructions. Take 750 mg in the morning and 1500 mg in the PM 03/06/22  Yes [provider]  mirtazapine  (REMERON ) 15 MG tablet Take 15 mg by mouth at bedtime. 02/27/22  Yes [provider]  Multiple Vitamin (MULTIVITAMIN WITH MINERALS) TABS  tablet Take 2 tablets by  mouth daily. Plus  Omega 3 gummy   Yes [provider]  Multiple Vitamins-Minerals (PRESERVISION AREDS 2 PO) Take 1 tablet by mouth 2 (two) times daily. [employer], Historical, MD  Omega-3 Fatty Acids (OMEGA-3 FISH OIL) 1200 MG CAPS Take 1,200 mg by mouth daily.   Yes [provider]  pantoprazole  (PROTONIX ) 40 MG tablet Take 40 mg by mouth daily.   Yes [provider]  potassium chloride  SA (KLOR-CON  M20) 20 MEQ tablet Take 1 tablet (20 mEq total) by mouth 3 (three) times daily. Patient taking differently: Take 20 mEq by mouth 2 (two) times daily. 02/19/24  Yes Fenton, Clint R, PA  pravastatin  (PRAVACHOL ) 20 MG tablet Take 20 mg by mouth at bedtime.   Yes [provider]  sucralfate  (CARAFATE ) 1 g tablet Take 1 g by mouth with breakfast, with lunch, and with evening meal.   Yes [provider]     Critical care time: 55 min    Madelynn Schilder, MD [facility]

## 2024-04-29 NOTE — ED Provider Notes (Signed)
 [place] [facility] AT [facility] Provider Note   CSN: [id number] Arrival date & time: 04/29/24  0012     History Chief Complaint  Patient presents with   Unresponsive    HPI [name] is a 89 y.o. female presenting for chief complaint of altered mental status.  89 year old female expansive medical history, had a cardiac ablation earlier today.  Had been recovering well in the afternoon and then found down by family member approximately 30 minutes prior to arrival.  Completely unresponsive on arrival. .   Patient's recorded medical, surgical, social, medication list and allergies were reviewed in the Snapshot window as part of the initial history.   Review of Systems   Review of Systems  Unable to perform ROS: Mental status change    Physical Exam Updated Vital Signs BP 116/68   Pulse 89   Temp 98.4 F (36.9 C) (Bladder)   Resp 18   SpO2 100%  Physical Exam Constitutional:      General: She is in acute distress.     Appearance: She is ill-appearing. She is not toxic-appearing.  HENT:     Head: Normocephalic and atraumatic.  Eyes:     Extraocular Movements: Extraocular movements intact.     Pupils: Pupils are equal, round, and reactive to light.  Cardiovascular:     Rate and Rhythm: Normal rate.  Pulmonary:     Effort: Respiratory distress present.     Breath sounds: Rhonchi present.  Abdominal:     General: Abdomen is flat.  Musculoskeletal:        General: No swelling, deformity or signs of injury.     Cervical back: Normal range of motion. No rigidity.  Skin:    General: Skin is warm and dry.  Neurological:     Mental Status: She is alert.  Psychiatric:        Mood and Affect: Mood normal.      ED Course/ Medical Decision Making/ A&P    Procedures Procedure Name: Intubation Date/Time: 04/29/2024 1:27 AM  Performed by: Onetha Bile, MDPre-anesthesia Checklist: Patient identified, Patient being monitored, Emergency  Drugs available, Timeout performed and Suction available Oxygen Delivery Method: Non-rebreather mask Preoxygenation: Pre-oxygenation with 100% oxygen Induction Type: Rapid sequence Ventilation: Mask ventilation without difficulty Tube size: 7.5 mm Number of attempts: 1 Placement Confirmation: ETT inserted through vocal cords under direct vision, CO2 detector and Breath sounds checked- equal and bilateral Secured at: 22 cm Tube secured with: ETT holder    .Critical Care  Performed by: Onetha Bile, MD Authorized by: Onetha Bile, MD   Critical care provider statement:    Critical care time (minutes):  95   Critical care was necessary to treat or prevent imminent or life-threatening deterioration of the following conditions:  Respiratory failure   Critical care was time spent personally by me on the following activities:  Development of treatment plan with patient or surrogate, discussions with consultants, evaluation of patient's response to treatment, examination of patient, ordering and review of laboratory studies, ordering and review of radiographic studies, ordering and performing treatments and interventions, pulse oximetry, re-evaluation of patient's condition and review of old charts    Medications Ordered in ED Medications  etomidate (AMIDATE) injection (20 mg Intravenous Given 04/29/24 0019)  rocuronium  (ZEMURON ) injection (100 mg Intravenous Given 04/29/24 0020)  fentaNYL  (SUBLIMAZE ) injection 25 mcg (25 mcg Intravenous Given 04/29/24 0053)  fentaNYL  (SUBLIMAZE ) injection 25-100 mcg (has no administration in time range)  0.9 %  sodium  chloride infusion (has no administration in time range)  norepinephrine (LEVOPHED) 4mg  in (0.016 mg/mL) premix infusion (3 mcg/min Intravenous New Bag/Given 04/29/24 0045)  piperacillin-tazobactam (ZOSYN) IVPB 3.375 g (has no administration in time range)  vancomycin (VANCOREADY) IVPB 2000 mg/400 mL (has no administration in time  range)  calcium gluconate inj 10% (1 g) URGENT USE ONLY! (1 g Intravenous Given 04/29/24 0051)  insulin aspart (novoLOG) injection 5 Units (5 Units Intravenous Given 04/29/24 0049)    And  dextrose  50 % solution 50 mL (50 mLs Intravenous Given 04/29/24 0045)  sodium bicarbonate injection 50 mEq (50 mEq Intravenous Given 04/29/24 0050)    Medical Decision Making:   [name] is a 89 y.o. female who presented to the ED today with chief complaint of altered mental status detailed above.    Handoff received from EMS.  Additional history discussed with patient's family/caregivers.  Patient placed on continuous vitals and telemetry monitoring while in ED which was reviewed periodically.  Complete initial physical exam performed, notably the patient  was GCS 3, choking on secretions, blood pressure normal.  RSI proceeded with as above for airway protection. Patient stabilized on IV fluid, ventilator therapy and peripheral norepinephrine    Reviewed and confirmed nursing documentation for past medical history, family history, social history.    Initial Assessment:   Patient's history of present illness and physical exam findings are most consistent with likely cardiac etiology given the sudden collapse and altered mentation.  She has an indwelling pacemaker that is being interrogated at this time.  Suspect that she likely had a non-perfusing rhythm. May have been a traumatic event as she was on the toilet and fell, CT head ordered. May be a pulmonary embolism given recent procedure, PE study ordered. Acute coronary syndrome also considered possible, EKG/troponins ordered  Reassessment and Plan:   Patient's initial lab work showed hyperkalemia.  Patient treated with calcium gluconate, insulin dextrose  for stabilization. Leukocytosis and positive lactic acid in the setting of hypotension may reflect either aspiration event or developing sepsis.  Patient broadly treated with Vanco,  piperacillin/tazobactam pending blood cultures and further stabilization given recent procedure. Consulted critical care medicine for evaluation, discussed case and they will evaluate for admission to the intensive care unit.   Disposition:   Based on the above findings, I believe this patient is stable for admission.    Patient/family educated about specific findings on our evaluation and explained exact reasons for admission.  Patient/family educated about clinical situation and time was allowed to answer questions.   Admission team communicated with and agreed with need for admission. Patient admitted. Patient  ready to move at this time.     Emergency Department Medication Summary:   Medications  etomidate (AMIDATE) injection (20 mg Intravenous Given 04/29/24 0019)  rocuronium  (ZEMURON ) injection (100 mg Intravenous Given 04/29/24 0020)  fentaNYL  (SUBLIMAZE ) injection 25 mcg (25 mcg Intravenous Given 04/29/24 0053)  fentaNYL  (SUBLIMAZE ) injection 25-100 mcg (has no administration in time range)  0.9 %  sodium chloride  infusion (has no administration in time range)  norepinephrine (LEVOPHED) 4mg  in (0.016 mg/mL) premix infusion (3 mcg/min Intravenous New Bag/Given 04/29/24 0045)  piperacillin-tazobactam (ZOSYN) IVPB 3.375 g (has no administration in time range)  vancomycin (VANCOREADY) IVPB 2000 mg/400 mL (has no administration in time range)  calcium gluconate inj 10% (1 g) URGENT USE ONLY! (1 g Intravenous Given 04/29/24 0051)  insulin aspart (novoLOG) injection 5 Units (5 Units Intravenous Given 04/29/24 0049)    And  dextrose  50 % solution 50 mL (50 mLs Intravenous Given 04/29/24 0045)  sodium bicarbonate injection 50 mEq (50 mEq Intravenous Given 04/29/24 0050)         Clinical Impression:  1. Glasgow coma scale total score 3-8, at arrival to emergency department [facility])      Admit   Final Clinical Impression(s) / ED Diagnoses Final diagnoses:  Glasgow coma scale total score  3-8, at arrival to emergency department [facility])    Rx / DC Orders ED Discharge Orders     None         Onetha Bile, MD 04/29/24 0130

## 2024-04-29 NOTE — Progress Notes (Signed)
 eLink Physician-Brief Progress Note Patient Name: [name] DOB: [birth year] MRN: [id number]   Date of Service  04/29/2024  HPI/Events of Note  Patient needs bilateral soft wrist restraints to prevent self-extubation.  eICU Interventions  Restraints ordered.        [name] U Margree Gimbel 04/29/2024, 5:55 AM

## 2024-04-30 DIAGNOSIS — J690 Pneumonitis due to inhalation of food and vomit: Secondary | ICD-10-CM | POA: Diagnosis not present

## 2024-04-30 DIAGNOSIS — J9601 Acute respiratory failure with hypoxia: Secondary | ICD-10-CM | POA: Diagnosis not present

## 2024-04-30 DIAGNOSIS — I2694 Multiple subsegmental pulmonary emboli without acute cor pulmonale: Secondary | ICD-10-CM

## 2024-04-30 DIAGNOSIS — J90 Pleural effusion, not elsewhere classified: Secondary | ICD-10-CM | POA: Diagnosis not present

## 2024-04-30 DIAGNOSIS — I2699 Other pulmonary embolism without acute cor pulmonale: Secondary | ICD-10-CM

## 2024-04-30 LAB — CBC
HCT: 34.6 % — ABNORMAL LOW (ref 36.0–46.0)
Hemoglobin: 10.9 g/dL — ABNORMAL LOW (ref 12.0–15.0)
MCH: 27.5 pg (ref 26.0–34.0)
MCHC: 31.5 g/dL (ref 30.0–36.0)
MCV: 87.2 fL (ref 80.0–100.0)
Platelets: 212 10*3/uL (ref 150–400)
RBC: 3.97 MIL/uL (ref 3.87–5.11)
RDW: 15.6 % — ABNORMAL HIGH (ref 11.5–15.5)
WBC: 12.3 10*3/uL — ABNORMAL HIGH (ref 4.0–10.5)
nRBC: 0 % (ref 0.0–0.2)

## 2024-04-30 LAB — CULTURE, RESPIRATORY W GRAM STAIN

## 2024-04-30 LAB — BLOOD GAS, ARTERIAL
Acid-Base Excess: 0.5 mmol/L (ref 0.0–2.0)
Bicarbonate: 25.4 mmol/L (ref 20.0–28.0)
O2 Saturation: 96.7 %
Patient temperature: 37.4
pCO2 arterial: 42 mmHg (ref 32–48)
pH, Arterial: 7.39 (ref 7.35–7.45)
pO2, Arterial: 79 mmHg — ABNORMAL LOW (ref 83–108)

## 2024-04-30 LAB — GLUCOSE, CAPILLARY
Glucose-Capillary: 104 mg/dL — ABNORMAL HIGH (ref 70–99)
Glucose-Capillary: 108 mg/dL — ABNORMAL HIGH (ref 70–99)
Glucose-Capillary: 109 mg/dL — ABNORMAL HIGH (ref 70–99)
Glucose-Capillary: 110 mg/dL — ABNORMAL HIGH (ref 70–99)
Glucose-Capillary: 111 mg/dL — ABNORMAL HIGH (ref 70–99)
Glucose-Capillary: 65 mg/dL — ABNORMAL LOW (ref 70–99)
Glucose-Capillary: 68 mg/dL — ABNORMAL LOW (ref 70–99)
Glucose-Capillary: 86 mg/dL (ref 70–99)

## 2024-04-30 LAB — COMPREHENSIVE METABOLIC PANEL WITH GFR
ALT: 51 U/L — ABNORMAL HIGH (ref 0–44)
AST: 29 U/L (ref 15–41)
Albumin: 2.8 g/dL — ABNORMAL LOW (ref 3.5–5.0)
Alkaline Phosphatase: 61 U/L (ref 38–126)
Anion gap: 11 (ref 5–15)
BUN: 24 mg/dL — ABNORMAL HIGH (ref 8–23)
CO2: 23 mmol/L (ref 22–32)
Calcium: 8.7 mg/dL — ABNORMAL LOW (ref 8.9–10.3)
Chloride: 106 mmol/L (ref 98–111)
Creatinine, Ser: 1.1 mg/dL — ABNORMAL HIGH (ref 0.44–1.00)
GFR, Estimated: 48 mL/min — ABNORMAL LOW (ref >60)
Glucose, Bld: 95 mg/dL (ref 70–99)
Potassium: 4.2 mmol/L (ref 3.5–5.1)
Sodium: 140 mmol/L (ref 135–145)
Total Bilirubin: 0.8 mg/dL (ref 0.0–1.2)
Total Protein: 5.1 g/dL — ABNORMAL LOW (ref 6.5–8.1)

## 2024-04-30 LAB — TRIGLYCERIDES: Triglycerides: 132 mg/dL (ref <150)

## 2024-04-30 LAB — PHOSPHORUS: Phosphorus: 3.5 mg/dL (ref 2.5–4.6)

## 2024-04-30 LAB — APTT: aPTT: 90 s — ABNORMAL HIGH (ref 24–36)

## 2024-04-30 LAB — MAGNESIUM: Magnesium: 1.8 mg/dL (ref 1.7–2.4)

## 2024-04-30 LAB — HEPARIN LEVEL (UNFRACTIONATED): Heparin Unfractionated: >1.1 [IU]/mL — ABNORMAL HIGH (ref 0.30–0.70)

## 2024-04-30 MED ORDER — DEXMEDETOMIDINE HCL IN NACL 400 MCG/100ML IV SOLN
0.0000 ug/kg/h | INTRAVENOUS | Status: DC
  Administered 2024-04-30: 0.6 ug/kg/h via INTRAVENOUS
  Administered 2024-04-30: 0.4 ug/kg/h via INTRAVENOUS
  Filled 2024-04-30 (×2): qty 100

## 2024-04-30 MED ORDER — DEXTROSE 50 % IV SOLN
INTRAVENOUS | Status: AC
  Administered 2024-04-30: 12.5 g
  Filled 2024-04-30: qty 50

## 2024-04-30 MED ORDER — GABAPENTIN 250 MG/5ML PO SOLN
600.0000 mg | Freq: Every day | ORAL | Status: DC
  Filled 2024-04-30 (×3): qty 12

## 2024-04-30 MED ORDER — FUROSEMIDE 10 MG/ML IJ SOLN
40.0000 mg | Freq: Once | INTRAMUSCULAR | Status: AC
  Administered 2024-04-30: 40 mg via INTRAVENOUS
  Filled 2024-04-30: qty 4

## 2024-04-30 MED ORDER — GABAPENTIN 250 MG/5ML PO SOLN
300.0000 mg | Freq: Every day | ORAL | Status: DC
  Administered 2024-04-30: 300 mg
  Filled 2024-04-30 (×3): qty 6

## 2024-04-30 MED ORDER — FAMOTIDINE 20 MG PO TABS
20.0000 mg | ORAL_TABLET | Freq: Every day | ORAL | Status: DC
  Administered 2024-05-03: 20 mg via ORAL
  Filled 2024-04-30: qty 1

## 2024-04-30 MED ORDER — DEXTROSE 50 % IV SOLN
12.5000 g | INTRAVENOUS | Status: AC
  Administered 2024-04-30: 12.5 g via INTRAVENOUS

## 2024-04-30 MED ORDER — LABETALOL HCL 100 MG PO TABS
200.0000 mg | ORAL_TABLET | Freq: Two times a day (BID) | ORAL | Status: DC
  Administered 2024-04-30: 200 mg
  Filled 2024-04-30 (×2): qty 2

## 2024-04-30 MED ORDER — ENOXAPARIN SODIUM 100 MG/ML IJ SOSY
90.0000 mg | PREFILLED_SYRINGE | Freq: Two times a day (BID) | INTRAMUSCULAR | Status: DC
  Administered 2024-04-30 – 2024-05-03 (×7): 90 mg via SUBCUTANEOUS
  Filled 2024-04-30 (×9): qty 0.9

## 2024-04-30 MED ORDER — FAMOTIDINE IN NACL 20-0.9 MG/50ML-% IV SOLN
20.0000 mg | Freq: Every day | INTRAVENOUS | Status: DC
  Administered 2024-05-01 – 2024-05-02 (×2): 20 mg via INTRAVENOUS
  Filled 2024-04-30 (×2): qty 50

## 2024-04-30 MED FILL — Fentanyl Citrate Preservative Free (PF) Inj 100 MCG/2ML: INTRAMUSCULAR | Qty: 1 | Status: AC

## 2024-04-30 MED FILL — Propofol IV Emul 1000 MG/100ML (10 MG/ML): INTRAVENOUS | Qty: 23.1 | Status: AC

## 2024-04-30 NOTE — Progress Notes (Signed)
 Hypoglycemic Event  CBG: 68  Treatment: D50 25 mL (12.5 gm)  Symptoms: None  Follow-up CBG: Time:0405 CBG Result:104  Possible Reasons for Event: NPO    Barb Levers Latavion Halls

## 2024-04-30 NOTE — Progress Notes (Signed)
 eLink Physician-Brief Progress Note Patient Name: [name] DOB: 06-14-1933 MRN: [id number]   Date of Service  04/30/2024  HPI/Events of Note  Restraints order renewed to prevent self extubation.  eICU Interventions          Marlynn Singer 04/30/2024, 6:19 AM

## 2024-04-30 NOTE — Procedures (Signed)
 Extubation Procedure Note  Patient Details:   Name: [name] DOB: 23-Nov-1932 MRN: [id number]   Airway Documentation:    Vent end date: 04/30/24 Vent end time: 1650   Evaluation  O2 sats: stable throughout Complications: No apparent complications Patient did tolerate procedure well. Bilateral Breath Sounds: Rhonchi   Yes  Pt extubated per order to 7L HFNC. Pt had a positive cuff leak, able to speak name but softly, no stridor noted and not able to cough at this time.   Parley Bolls 04/30/2024, 5:03 PM

## 2024-04-30 NOTE — Progress Notes (Signed)
 NAMEShantina [name], MRN:  [id number], DOB:  [birth year], LOS: 1 ADMISSION DATE:  04/29/2024, CONSULTATION DATE: 04/29/2024 REFERRING MD: Onetha Bile, MD, CHIEF COMPLAINT: AMS for 4 hrs  History of Present Illness:  An 89 yr old female patient with PPM (8 weeks ago), PAF (failed cardioversion twice in March 2025), HTN, COPD, GERD, TIA, and hyperlipidemia. She underwent Afib ablation yesterday noon time and discharged home. At 9:15 pm, she was weak, moaning, altered, and had a weak pulse and syncope. She never lost a pulse or had apnea. EMS arrived and she was intubated due to low GCS 4. Amiodarone  was d/c 2 months ago. Compliant with meds. No hx of active smoking (but second hand smoking), illicit drug use, or alcohol drinking. Given one dose of Vanco and Zosyn. Started on Levophed due to hypotension. Medical Rx for hyperkalemia was given in ED.   Pertinent  Medical History  PPM (April 2025), PAF (failed cardioversion twice March 2025), HTN, COPD, GERD, TIA, hyperlipidemia Significant Hospital Events: Including procedures, antibiotic start and stop dates in addition to other pertinent events   6/11: intubated, seen in ED, admit to ICU, CTA significant for R segmental PE 6/12 stable, SBT/SAT  Interim History / Subjective:  Remains off pressors, no DVT on dopplers, Echo with no significant RV/LV failure  Objective    Blood pressure 113/62, pulse 89, temperature 99.5 F (37.5 C), resp. rate 20, height 5' 3 (1.6 m), weight 91.9 kg, SpO2 96%.    Vent Mode: PRVC FiO2 (%):  [40 %-50 %] 40 % Set Rate:  [20 bmp] 20 bmp Vt Set:  [420 mL-500 mL] 420 mL PEEP:  [5 cmH20] 5 cmH20 Plateau Pressure:  [20 cmH20-25 cmH20] 23 cmH20   Intake/Output Summary (Last 24 hours) at 04/30/2024 1009 Last data filed at 04/30/2024 0600 Gross per 24 hour  Intake 1964.39 ml  Output 215 ml  Net 1749.39 ml   Filed Weights   04/29/24 0300  Weight: 91.9 kg    General:  well nourished elderly F intubated and  sedated in NAD  HEENT: MM pink/moist, ETT in place Neuro: examined on precedex, weaned off Fentanyl  and propofol  opening eyes to voice, not following commands yet CV: s1s2 rrr, no m/r/g PULM:  scattered wheezing throughout, synchronous with vent  GI: soft, bsx4 active  Extremities: warm/dry, trace edema  Skin: no rashes or lesions    Resolved problem list   Assessment and Plan     Acute hypoxic resp failure secondary to aspiration Pneumonia and R segmental PE Baseline COPD R pleural effusion -transition heparin  to lovenox -currently hemodynamically stable and PE us  segmental, no indication for lytics or IR consult  -trop and BNP were not significantly elevated  -continue zosyn -start yupelri , brovana , pulmicort  -had a thoracentesis recently that was transudative and cytology was negative -SBT/SAT today on precedex --Maintain full vent support with SAT/SBT as tolerated -titrate Vent setting to maintain SpO2 greater than or equal to 90%. -HOB elevated 30 degrees. -Plateau pressures less than 30 cm H20.  -Follow chest x-ray, ABG prn.   -Bronchial hygiene and RT/bronchodilator protocol.      S/p Afib ablation 04/28/2024 PPM, HTN, dyslipidemia -BP up-trending, resume home Labetalol  200mg  bid  Slightly elevated LFT -Monitor    HyperK -stable, continue to follow    AKI -follow renal indices, UOP and avoid  nephrotoxins  -creatinine 1.1, stable from yesterday, BUN 24 -UOP 0.2cc/kg/hr minimal, may need more IVF, lactic <2   GERD  -H2B  Best Practice (right click and Reselect all SmartList Selections daily)   Diet/type: NPO w/ oral meds DVT prophylaxis systemic dose LMWH Pressure ulcer(s): N/A GI prophylaxis: H2B Lines: N/A Foley:  Yes, and it is still needed Code Status:  full code Last date of multidisciplinary goals of care discussion [family updated at the bedside 6/12]  Labs   CBC: Recent Labs  Lab 04/29/24 0028 04/29/24 0031 04/29/24 0428  04/29/24 1539 04/30/24 0606  WBC  --  15.6*  --  15.4* 12.3*  NEUTROABS  --  10.6*  --   --   --   HGB 13.6 11.9* 11.6* 10.9* 10.9*  HCT 40.0 40.6 34.0* 35.4* 34.6*  MCV  --  92.7  --  87.2 87.2  PLT  --  309  --  243 212    Basic Metabolic Panel: Recent Labs  Lab 04/29/24 0028 04/29/24 0031 04/29/24 0428 04/29/24 0551 04/29/24 1539 04/30/24 0606  NA 137 135 136 138 139 140  K 5.9* 5.9* 4.4 5.3* 4.9 4.2  CL 99 101  --  103 104 106  CO2  --  26  --  24 28 23   GLUCOSE 204* 205*  --  138* 113* 95  BUN 26* 22  --  19 21 24*  CREATININE 1.20* 1.20*  --  1.04* 1.15* 1.10*  CALCIUM  --  8.7*  --  8.9 9.1 8.7*  MG  --  2.2  --   --   --  1.8  PHOS  --   --   --   --   --  3.5   GFR: Estimated Creatinine Clearance: 37.3 mL/min (A) (by C-G formula based on SCr of 1.1 mg/dL (H)). Recent Labs  Lab 04/29/24 0029 04/29/24 0031 04/29/24 0553 04/29/24 1539 04/30/24 0606  WBC  --  15.6*  --  15.4* 12.3*  LATICACIDVEN 2.2*  --  1.8 1.5  --     Liver Function Tests: Recent Labs  Lab 04/29/24 0031 04/29/24 1539 04/30/24 0606  AST 66* 40 29  ALT 63* 67* 51*  ALKPHOS 97 78 61  BILITOT 0.6 1.0 0.8  PROT 6.0* 5.4* 5.1*  ALBUMIN 3.4* 2.9* 2.8*   Recent Labs  Lab 04/29/24 0031  LIPASE 37   No results for input(s): AMMONIA in the last 168 hours.  ABG    Component Value Date/Time   PHART 7.39 04/30/2024 0541   PCO2ART 42 04/30/2024 0541   PO2ART 79 (L) 04/30/2024 0541   HCO3 25.4 04/30/2024 0541   TCO2 26 04/29/2024 0428   ACIDBASEDEF 1.0 04/29/2024 0428   O2SAT 96.7 04/30/2024 0541     Coagulation Profile: No results for input(s): INR, PROTIME in the last 168 hours.  Cardiac Enzymes: No results for input(s): CKTOTAL, CKMB, CKMBINDEX, TROPONINI in the last 168 hours.  HbA1C: Hgb A1c MFr Bld  Date/Time Value Ref Range Status  04/29/2024 05:51 AM 5.9 (H) 4.8 - 5.6 % Final    Comment:    (NOTE) Diagnosis of Diabetes The following HbA1c ranges  recommended by the American Diabetes Association (ADA) may be used as an aid in the diagnosis of diabetes mellitus.  Hemoglobin             Suggested A1C NGSP%              Diagnosis  <5.7                   Non Diabetic  5.7-6.4  Pre-Diabetic  >6.4                   Diabetic  <7.0                   Glycemic control for                       adults with diabetes.    09/13/2020 12:05 PM 5.8 (H) 4.8 - 5.6 % Final    Comment:    (NOTE)         Prediabetes: 5.7 - 6.4         Diabetes: >6.4         Glycemic control for adults with diabetes: <7.0     CBG: Recent Labs  Lab 04/29/24 2329 04/30/24 0333 04/30/24 0405 04/30/24 0734 04/30/24 0806  GLUCAP 85 68* 104* 65* 86    Review of Systems:   Intubated   Past Medical History:  She,  has a past medical history of Arthritis, Atrial fibrillation, transient (HCC), Closed fracture of right fibula and tibia (01/13/2023), Diverticulitis, Dyspnea, GERD (gastroesophageal reflux disease), Hyperlipidemia, Hypertension, Stroke (HCC), and TIA (transient ischemic attack).   Surgical History:   Past Surgical History:  Procedure Laterality Date   AV NODE ABLATION N/A 04/28/2024   Procedure: AV NODE ABLATION;  Surgeon: Efraim Grange, MD;  Location: [facility];  Service: Cardiovascular;  Laterality: N/A;   CARDIOVERSION N/A 01/21/2024   Procedure: CARDIOVERSION;  Surgeon: Olinda Bertrand, DO;  Location: [facility];  Service: Cardiovascular;  Laterality: N/A;   CARDIOVERSION N/A 02/11/2024   Procedure: CARDIOVERSION;  Surgeon: Hugh Madura, MD;  Location: [facility];  Service: Cardiovascular;  Laterality: N/A;   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/13/2020   Procedure: CONVERSION TO LEFT TOTAL HIP-POSTERIOR APPROACH;  Surgeon: Claiborne Crew, MD;  Location: [facility];  Service: Orthopedics;  Laterality: Left;  90 mins   HIP FRACTURE SURGERY Left 04/16/2020   PACEMAKER IMPLANT N/A 03/16/2024   Procedure:  PACEMAKER IMPLANT;  Surgeon: Efraim Grange, MD;  Location: [facility];  Service: Cardiovascular;  Laterality: N/A;   TIBIA IM NAIL INSERTION Right 01/15/2023   Procedure: INTRAMEDULLARY (IM) NAIL  RIGHT TIBIAL;  Surgeon: Hardy Lia, MD;  Location: [facility] OR;  Service: Orthopedics;  Laterality: Right;     Social History:   reports that she has never smoked. She has never used smokeless tobacco. She reports that she does not drink alcohol and does not use drugs.   Family History:  Her family history includes Diabetes in her mother.   Allergies Allergies  Allergen Reactions   Keflex [Cephalexin] Other (See Comments)    makes pt feel really bad  Tolerated Cephalosporin Date: 01/15/23.     Morphine  Anxiety and Other (See Comments)    Pt states this made her feel like she was going to die     Home Medications  Prior to Admission medications   Medication Sig Start Date End Date Taking? Authorizing Provider  acetaminophen  (TYLENOL ) 500 MG tablet Take 2 tablets (1,000 mg total) by mouth every 8 (eight) hours. 09/16/20  Yes Babish, Zoila Hines, PA-C  albuterol  (VENTOLIN  HFA) 108 (90 Base) MCG/ACT inhaler Inhale 2 puffs into the lungs every 4 (four) hours as needed for wheezing or shortness of breath. 06/08/21  Yes [provider]  apixaban  (ELIQUIS ) 5 MG TABS tablet Take 1 tablet (5 mg total) by mouth 2 (two)  times daily. 03/20/24  Yes Mealor, Augustus E, MD  Ascorbic Acid  (VITAMIN C ) 1000 MG tablet Take 1,000 mg by mouth daily.   Yes [provider]  cetirizine (ZYRTEC) 10 MG tablet Take 10 mg by mouth daily.    Yes [provider]  Cholecalciferol  (VITAMIN D3) 50 MCG (2000 UT) TABS Take 2,000 Units by mouth daily.   Yes [provider]  cyanocobalamin  1000 MCG tablet Take 1,000 mcg by mouth every other day. Vitamin B12   Yes [provider]  diclofenac  Sodium (VOLTAREN ) 1 % GEL Apply 2 g topically daily as needed (joint pain).   Yes  [provider]  diltiazem  (CARDIZEM  CD) 300 MG 24 hr capsule Take 300 mg by mouth daily.   Yes [provider]  diltiazem  (CARDIZEM ) 30 MG tablet Take 30 mg by mouth as directed. Take one (30mg ) capsule by mouth as needed for A-Fib. 03/19/24  Yes [provider]  escitalopram (LEXAPRO) 5 MG tablet Take 5 mg by mouth daily. 03/19/24  Yes [provider]  ferrous sulfate  325 (65 FE) MG tablet Take 325 mg by mouth 2 (two) times daily with a meal.   Yes [provider]  furosemide  (LASIX ) 80 MG tablet Take 1 tablet (80 mg total) by mouth daily. 01/28/24 09/18/24 Yes Croitoru, Mihai, MD  gabapentin  (NEURONTIN ) 300 MG capsule Take 300-600 mg by mouth See admin instructions. Take one capsule (300 mg) in the morning and two capsules (600 mg) in the evening.   Yes [provider]  hydroxypropyl methylcellulose / hypromellose (ISOPTO TEARS / GONIOVISC) 2.5 % ophthalmic solution Place 1 drop into both eyes daily as needed for dry eyes.   Yes [provider]  ipratropium-albuterol  (DUONEB) 0.5-2.5 (3) MG/3ML SOLN Take 3 mLs by nebulization every 6 (six) hours as needed (shortness of breath). 12/04/22  Yes Leona Rake, MD  labetalol  (NORMODYNE ) 200 MG tablet Take 200 mg by mouth 2 (two) times daily.   Yes [provider]  Magnesium  400 MG TABS Take 400 mg by mouth daily.   Yes [provider]  methocarbamol  (ROBAXIN ) 750 MG tablet Take 750-1,500 mg by mouth See admin instructions. Take 750 mg in the morning and 1500 mg in the PM 03/06/22  Yes [provider]  mirtazapine  (REMERON ) 15 MG tablet Take 15 mg by mouth at bedtime. 02/27/22  Yes [provider]  Multiple Vitamin (MULTIVITAMIN WITH MINERALS) TABS tablet Take 2 tablets by mouth daily. Plus  Omega 3 gummy   Yes [provider]  Multiple Vitamins-Minerals (PRESERVISION AREDS 2 PO) Take 1 tablet by mouth 2 (two) times daily. [employer],  Historical, MD  Omega-3 Fatty Acids (OMEGA-3 FISH OIL) 1200 MG CAPS Take 1,200 mg by mouth daily.   Yes [provider]  pantoprazole  (PROTONIX ) 40 MG tablet Take 40 mg by mouth daily.   Yes [provider]  potassium chloride  SA (KLOR-CON  M20) 20 MEQ tablet Take 1 tablet (20 mEq total) by mouth 3 (three) times daily. Patient taking differently: Take 20 mEq by mouth 2 (two) times daily. 02/19/24  Yes Fenton, Clint R, PA  pravastatin  (PRAVACHOL ) 20 MG tablet Take 20 mg by mouth at bedtime.   Yes [provider]  sucralfate  (CARAFATE ) 1 g tablet Take 1 g by mouth with breakfast, with lunch, and with evening meal.   Yes [provider]     Critical care time: 32 min    CRITICAL CARE Performed  by: Patt Boozer Lavoris Canizales   Total critical care time: 32 minutes  Critical care time was exclusive of separately billable procedures and treating other patients.  Critical care was necessary to treat or prevent imminent or life-threatening deterioration.  Critical care was time spent personally by me on the following activities: development of treatment plan with patient and/or surrogate as well as nursing, discussions with consultants, evaluation of patient's response to treatment, examination of patient, obtaining history from patient or surrogate, ordering and performing treatments and interventions, ordering and review of laboratory studies, ordering and review of radiographic studies, pulse oximetry and re-evaluation of patient's condition.  Patt Boozer Amiera Herzberg, PA-C [facility] See Amion for pager If no response to pager , please call [phone] until 7pm After 7:00 pm call Elink  [phone]

## 2024-05-01 ENCOUNTER — Inpatient Hospital Stay (HOSPITAL_COMMUNITY)

## 2024-05-01 ENCOUNTER — Encounter (HOSPITAL_COMMUNITY): Payer: Self-pay | Admitting: Pulmonary Disease

## 2024-05-01 DIAGNOSIS — G9340 Encephalopathy, unspecified: Secondary | ICD-10-CM | POA: Diagnosis not present

## 2024-05-01 DIAGNOSIS — J690 Pneumonitis due to inhalation of food and vomit: Secondary | ICD-10-CM | POA: Diagnosis not present

## 2024-05-01 DIAGNOSIS — I2699 Other pulmonary embolism without acute cor pulmonale: Secondary | ICD-10-CM

## 2024-05-01 DIAGNOSIS — J9601 Acute respiratory failure with hypoxia: Secondary | ICD-10-CM | POA: Diagnosis not present

## 2024-05-01 LAB — CBC WITH DIFFERENTIAL/PLATELET
Abs Immature Granulocytes: 0.08 10*3/uL — ABNORMAL HIGH (ref 0.00–0.07)
Basophils Absolute: 0.1 10*3/uL (ref 0.0–0.1)
Basophils Relative: 1 %
Eosinophils Absolute: 0.3 10*3/uL (ref 0.0–0.5)
Eosinophils Relative: 2 %
HCT: 34.7 % — ABNORMAL LOW (ref 36.0–46.0)
Hemoglobin: 10.4 g/dL — ABNORMAL LOW (ref 12.0–15.0)
Immature Granulocytes: 1 %
Lymphocytes Relative: 6 %
Lymphs Abs: 0.9 10*3/uL (ref 0.7–4.0)
MCH: 26.9 pg (ref 26.0–34.0)
MCHC: 30 g/dL (ref 30.0–36.0)
MCV: 89.9 fL (ref 80.0–100.0)
Monocytes Absolute: 1.2 10*3/uL — ABNORMAL HIGH (ref 0.1–1.0)
Monocytes Relative: 9 %
Neutro Abs: 11.2 10*3/uL — ABNORMAL HIGH (ref 1.7–7.7)
Neutrophils Relative %: 81 %
Platelets: 234 10*3/uL (ref 150–400)
RBC: 3.86 MIL/uL — ABNORMAL LOW (ref 3.87–5.11)
RDW: 15.6 % — ABNORMAL HIGH (ref 11.5–15.5)
WBC: 13.6 10*3/uL — ABNORMAL HIGH (ref 4.0–10.5)
nRBC: 0 % (ref 0.0–0.2)

## 2024-05-01 LAB — BASIC METABOLIC PANEL WITH GFR
Anion gap: 13 (ref 5–15)
BUN: 23 mg/dL (ref 8–23)
CO2: 24 mmol/L (ref 22–32)
Calcium: 9.1 mg/dL (ref 8.9–10.3)
Chloride: 105 mmol/L (ref 98–111)
Creatinine, Ser: 0.98 mg/dL (ref 0.44–1.00)
GFR, Estimated: 55 mL/min — ABNORMAL LOW (ref >60)
Glucose, Bld: 103 mg/dL — ABNORMAL HIGH (ref 70–99)
Potassium: 3.9 mmol/L (ref 3.5–5.1)
Sodium: 142 mmol/L (ref 135–145)

## 2024-05-01 LAB — PHOSPHORUS: Phosphorus: 4.2 mg/dL (ref 2.5–4.6)

## 2024-05-01 LAB — GLUCOSE, CAPILLARY
Glucose-Capillary: 104 mg/dL — ABNORMAL HIGH (ref 70–99)
Glucose-Capillary: 98 mg/dL (ref 70–99)
Glucose-Capillary: 113 mg/dL — ABNORMAL HIGH (ref 70–99)
Glucose-Capillary: 126 mg/dL — ABNORMAL HIGH (ref 70–99)
Glucose-Capillary: 122 mg/dL — ABNORMAL HIGH (ref 70–99)

## 2024-05-01 LAB — MAGNESIUM: Magnesium: 1.8 mg/dL (ref 1.7–2.4)

## 2024-05-01 MED ORDER — SODIUM CHLORIDE 0.9 % IV SOLN
2.0000 g | INTRAVENOUS | Status: DC
  Administered 2024-05-01 – 2024-05-02 (×2): 2 g via INTRAVENOUS
  Filled 2024-05-01 (×2): qty 20

## 2024-05-01 MED ORDER — ORAL CARE MOUTH RINSE: 15.0000 mL | OROMUCOSAL | Status: DC | PRN

## 2024-05-01 MED ORDER — HALOPERIDOL LACTATE 5 MG/ML IJ SOLN
1.0000 mg | Freq: Once | INTRAMUSCULAR | Status: AC | PRN
  Administered 2024-05-01: 1 mg via INTRAVENOUS
  Filled 2024-05-01: qty 1

## 2024-05-01 MED ORDER — ONDANSETRON HCL 4 MG/2ML IJ SOLN
4.0000 mg | Freq: Once | INTRAMUSCULAR | Status: AC
  Administered 2024-05-01: 4 mg via INTRAVENOUS

## 2024-05-01 MED ORDER — HYDROMORPHONE HCL 1 MG/ML IJ SOLN
0.5000 mg | INTRAMUSCULAR | Status: DC | PRN
  Administered 2024-05-01 – 2024-05-03 (×6): 0.5 mg via INTRAVENOUS
  Filled 2024-05-01 (×6): qty 1

## 2024-05-01 MED ORDER — ONDANSETRON HCL 4 MG/2ML IJ SOLN
4.0000 mg | Freq: Three times a day (TID) | INTRAMUSCULAR | Status: DC | PRN
  Administered 2024-05-01: 4 mg via INTRAVENOUS
  Filled 2024-05-01: qty 2

## 2024-05-01 MED ORDER — ONDANSETRON HCL 4 MG/2ML IJ SOLN
INTRAMUSCULAR | Status: AC
  Filled 2024-05-01: qty 2

## 2024-05-01 MED ORDER — METHYLPREDNISOLONE SODIUM SUCC 125 MG IJ SOLR
60.0000 mg | Freq: Two times a day (BID) | INTRAMUSCULAR | Status: DC
  Administered 2024-05-01: 60 mg via INTRAVENOUS
  Filled 2024-05-01: qty 2

## 2024-05-01 MED ORDER — METHYLPREDNISOLONE SODIUM SUCC 40 MG IJ SOLR
40.0000 mg | Freq: Two times a day (BID) | INTRAMUSCULAR | Status: DC
  Administered 2024-05-01: 40 mg via INTRAVENOUS
  Filled 2024-05-01: qty 1

## 2024-05-01 MED ORDER — FUROSEMIDE 10 MG/ML IJ SOLN
40.0000 mg | Freq: Every day | INTRAMUSCULAR | Status: DC
  Administered 2024-05-01 – 2024-05-03 (×3): 40 mg via INTRAVENOUS
  Filled 2024-05-01 (×3): qty 4

## 2024-05-01 MED ORDER — ALBUTEROL SULFATE (2.5 MG/3ML) 0.083% IN NEBU
2.5000 mg | INHALATION_SOLUTION | RESPIRATORY_TRACT | Status: DC
  Administered 2024-05-01 – 2024-05-03 (×14): 2.5 mg via RESPIRATORY_TRACT
  Filled 2024-05-01 (×14): qty 3

## 2024-05-01 MED ORDER — LABETALOL HCL 5 MG/ML IV SOLN
10.0000 mg | INTRAVENOUS | Status: DC | PRN
  Administered 2024-05-02 – 2024-05-03 (×2): 10 mg via INTRAVENOUS
  Filled 2024-05-01 (×2): qty 4

## 2024-05-01 NOTE — Progress Notes (Signed)
 eLink Physician-Brief Progress Note Patient Name: [name] DOB: 1933-05-20 MRN: [id number]   Date of Service  05/01/2024  HPI/Events of Note  Patient with agitated delirium.  eICU Interventions  Zyprexa 2.5 mg IM x 1  PRN ordered.        Ventura Leggitt U Khiree Bukhari 05/01/2024, 3:52 AM

## 2024-05-01 NOTE — Evaluation (Signed)
 Clinical/Bedside Swallow Evaluation Patient Details  Name: [name] MRN: [id number] Date of Birth: 20-Nov-1932  Today's Date: 05/01/2024 Time: SLP Start Time (ACUTE ONLY): 0933 SLP Stop Time (ACUTE ONLY): 0950 SLP Time Calculation (min) (ACUTE ONLY): 17 min  Past Medical History:  Past Medical History:  Diagnosis Date   Arthritis    Atrial fibrillation, transient (HCC)    post op hip surgery; May 2021   Closed fracture of right fibula and tibia 01/13/2023   Diverticulitis    Dyspnea    with exertion    GERD (gastroesophageal reflux disease)    Hyperlipidemia    Hypertension    Stroke (HCC)    hx opf mini strokes    TIA (transient ischemic attack)    Past Surgical History:  Past Surgical History:  Procedure Laterality Date   AV NODE ABLATION N/A 04/28/2024   Procedure: AV NODE ABLATION;  Surgeon: Efraim Grange, MD;  Location: [facility];  Service: Cardiovascular;  Laterality: N/A;   CARDIOVERSION N/A 01/21/2024   Procedure: CARDIOVERSION;  Surgeon: Olinda Bertrand, DO;  Location: [facility];  Service: Cardiovascular;  Laterality: N/A;   CARDIOVERSION N/A 02/11/2024   Procedure: CARDIOVERSION;  Surgeon: Hugh Madura, MD;  Location: [facility];  Service: Cardiovascular;  Laterality: N/A;   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/13/2020   Procedure: CONVERSION TO LEFT TOTAL HIP-POSTERIOR APPROACH;  Surgeon: Claiborne Crew, MD;  Location: [facility];  Service: Orthopedics;  Laterality: Left;  90 mins   HIP FRACTURE SURGERY Left 04/16/2020   PACEMAKER IMPLANT N/A 03/16/2024   Procedure: PACEMAKER IMPLANT;  Surgeon: Efraim Grange, MD;  Location: [facility];  Service: Cardiovascular;  Laterality: N/A;   TIBIA IM NAIL INSERTION Right 01/15/2023   Procedure: INTRAMEDULLARY (IM) NAIL  RIGHT TIBIAL;  Surgeon: Hardy Lia, MD;  Location: [facility];  Service: Orthopedics;  Laterality: Right;   HPI:  [name] is an 89 yo female presenting to ED 6/11 with  GCS of 4, intubated by EMS. Underwent ablation procedure 6/10. CTA showed small segmental PE, R hilar infiltrate, and mediastinal lymphadenopathy. CXR 6/13 shows increased R basilar opacity concerning for pleural effusion with associated atelectasis or infiltrate. ETT 6/10-6/12. Recent admission in February 2025, during which CXR showed RUL opacity concerning for infection s/p thoracentesis. Seen by SLP 11/27/22 with functional appearing oropharyngeal swallow, recommendations to continue regular diet with thin liquids. PMH includes GERD, PAF (failed cardioversion x2 March 2025), HTN, COPD, TIA, HLD    Assessment / Plan / Recommendation  Clinical Impression  Pt currently on 40 LPM HHFNC with a wet sounding vocal quality and cough. She is anxious but willing to participate. Her daughter at bedside reports pt's granddaughter (who is an SLP) had recently been concerned about pt's swallowing. She also has a history of GERD, which her daughter reports is poorly managed with medication. SLP provided thorough oral care and discussed importance of continuing this QID. Ice chips were provided which resulted in an increasingly wet voice and immediate cough, which was ineffective at mobilizing secretions adequately to expectorate. Purees were observed without s/s of dysphagia and no further coughing, although expect sensation changes given high level of required O2. Education was provided to pt and her daughter. Recommend she remain NPO except for essential meds which can be given crushed in puree. Consider alternative means of nutrition as appropriate for med administration. SLP will continue following as respiratory status allows. SLP Visit Diagnosis: Dysphagia, unspecified (R13.10)  Aspiration Risk  Severe aspiration risk;Risk for inadequate nutrition/hydration    Diet Recommendation NPO except meds;Alternative means - temporary    Medication Administration: Crushed with puree    Other  Recommendations Oral  Care Recommendations: Oral care QID;Staff/trained caregiver to provide oral care     Assistance Recommended at Discharge    Functional Status Assessment Patient has had a recent decline in their functional status and demonstrates the ability to make significant improvements in function in a reasonable and predictable amount of time.  Frequency and Duration min 2x/week  2 weeks       Prognosis Prognosis for improved oropharyngeal function: Good Barriers to Reach Goals: Time post onset;Severity of deficits      Swallow Study   General HPI: [name] is an 89 yo female presenting to ED 6/11 with GCS of 4, intubated by EMS. Underwent ablation procedure 6/10. CTA showed small segmental PE, R hilar infiltrate, and mediastinal lymphadenopathy. CXR 6/13 shows increased R basilar opacity concerning for pleural effusion with associated atelectasis or infiltrate. ETT 6/10-6/12. Recent admission in February 2025, during which CXR showed RUL opacity concerning for infection s/p thoracentesis. Seen by SLP 11/27/22 with functional appearing oropharyngeal swallow, recommendations to continue regular diet with thin liquids. PMH includes GERD, PAF (failed cardioversion x2 March 2025), HTN, COPD, TIA, HLD Type of Study: Bedside Swallow Evaluation Previous Swallow Assessment: see HPI Diet Prior to this Study: NPO Temperature Spikes Noted: No Respiratory Status: Nasal cannula (40 LPM HHFNC) History of Recent Intubation: Yes Total duration of intubation (days): 3 days Date extubated: 04/30/24 Behavior/Cognition: Alert;Cooperative;Requires cueing Oral Cavity Assessment: Within Functional Limits Oral Care Completed by SLP: Yes Oral Cavity - Dentition: Missing dentition Vision: Functional for self-feeding Self-Feeding Abilities: Total assist Patient Positioning: Upright in bed Baseline Vocal Quality: Wet Volitional Cough: Wet;Weak Volitional Swallow: Able to elicit    Oral/Motor/Sensory Function  Overall Oral Motor/Sensory Function: Within functional limits   Ice Chips Ice chips: Impaired Presentation: Spoon Pharyngeal Phase Impairments: Wet Vocal Quality;Cough - Immediate   Thin Liquid Thin Liquid: Not tested    Nectar Thick Nectar Thick Liquid: Not tested   Honey Thick Honey Thick Liquid: Not tested   Puree Puree: Within functional limits Presentation: Spoon   Solid     Solid: Not tested      Amil Kale, M.A., CCC-SLP Speech Language Pathology, Acute Rehabilitation Services  Secure Chat preferred [phone]  05/01/2024,10:56 AM

## 2024-05-01 NOTE — Progress Notes (Signed)
 PT Cancellation Note  Patient Details Name: [name] MRN: [id number] DOB: [birth year]   Cancelled Treatment:    Reason Eval/Treat Not Completed: (P) Patient not medically ready. RN reports pt's respiratory status is unstable and pt may need to be re-intubated today. Will plan to hold off on PT at this time and follow-up as able.   Vernida Goodie, PT, DPT Acute Rehabilitation Services  Office: [phone]    [name] 05/01/2024, 8:39 AM

## 2024-05-01 NOTE — TOC Initial Note (Signed)
 Transition of Care [facility]) - Initial/Assessment Note    Patient Details  Name: [name] MRN: [id number] Date of Birth: 05/23/1933  Transition of Care [facility]) CM/SW Contact:    Jannice Mends, LCSW Phone Number: 05/01/2024, 5:23 PM  Clinical Narrative:                 Patient admitted from home undergoing pneumonia workup. TOC following for needs once medically stable.     Barriers to Discharge: Continued Medical Work up   Patient Goals and CMS Choice            Expected Discharge Plan and Services       Living arrangements for the past 2 months: Single Family Home                                      Prior Living Arrangements/Services Living arrangements for the past 2 months: Single Family Home   Patient language and need for interpreter reviewed:: Yes        Need for Family Participation in Patient Care: Yes (Comment) Care giver support system in place?: Yes (comment) Current home services: DME (rolling walker, wheelcahir, bedside commode, shower chair.) Criminal Activity/Legal Involvement Pertinent to Current Situation/Hospitalization: No - Comment as needed  Activities of Daily Living      Permission Sought/Granted   Permission granted to share information with : No  Share Information with NAME: Dr. Arne Bevel     Permission granted to share info w Relationship: Son in law/daughter  Permission granted to share info w Contact Information: [phone]  Emotional Assessment Appearance:: Appears stated age Attitude/Demeanor/Rapport: Unable to Assess Affect (typically observed): Unable to Assess Orientation: : Oriented to Self Alcohol / Substance Use: Not Applicable Psych Involvement: No (comment)  Admission diagnosis:  Aspiration pneumonia (HCC) [J69.0] Glasgow coma scale total score 3-8, at arrival to emergency department [facility]) [W29.5621] Patient Active Problem List   Diagnosis Date Noted   Pulmonary embolus (HCC) 04/30/2024    Aspiration pneumonia (HCC) 04/29/2024   Hypercoagulable state due to persistent atrial fibrillation (HCC) 01/24/2024   Encounter for monitoring amiodarone  therapy 01/24/2024   Persistent atrial fibrillation (HCC) 01/22/2024   Pleural effusion on right - s/p 700 ml thoracentesis 01-19-2024 01/18/2024   CAP (community acquired pneumonia) 01/16/2024   Obesity, Class II, BMI 35-39.9 01/16/2024   Chronic diastolic CHF (congestive heart failure) (HCC) 01/16/2024   Acute on chronic diastolic CHF (congestive heart failure) (HCC) 01/16/2024   GERD (gastroesophageal reflux disease) 01/14/2023   Atrial fibrillation with RVR (HCC) 11/27/2022   Acute respiratory failure with hypoxia (HCC) 11/24/2022   COPD (chronic obstructive pulmonary disease) (HCC) 11/24/2022   Essential hypertension 11/24/2022   Hyperlipidemia 11/24/2022   S/P left THA, 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   PAF (paroxysmal atrial fibrillation) (HCC) 07/21/2020   PCP:  Melva Stabile, MD Pharmacy:   [facility] [phone] - [place],  - [address] AT [facility] [address] [place] [address] Phone: [phone] Fax: [phone]     Social Drivers of Health (SDOH) Social History: SDOH Screenings   Food Insecurity: No Food Insecurity (04/29/2024)  Housing: Low Risk  (04/29/2024)  Transportation Needs: No Transportation Needs (04/29/2024)  Utilities: Not At Risk (04/29/2024)  Social Connections: Socially Isolated (04/29/2024)  Tobacco Use: Low Risk  (03/09/2024)   SDOH Interventions:  Readmission Risk Interventions     No data to display

## 2024-05-01 NOTE — Progress Notes (Signed)
 Initial Nutrition Assessment  DOCUMENTATION CODES:   Not applicable  INTERVENTION:   If pt re-intubated or NG placed recommend  Initiate tube feeding  Osmolite 1.5 at 20 ml/h and increase by 10 ml every 8 hours to goal rate of 40 ml/hr (960 ml per day)  Prosource TF20 60 ml daily  Provides 1520 kcal, 80 gm protein, 729 ml free water  daily   NUTRITION DIAGNOSIS:   Inadequate oral intake related to inability to eat as evidenced by NPO status.  GOAL:   Patient will meet greater than or equal to 90% of their needs  MONITOR:   I & O's  REASON FOR ASSESSMENT:        ASSESSMENT:   Pt with PMH of PPM, PAF (failed cardioversion twice 3/25), HTN, COPD, GERD, TIA, and HLD who underwent Afib ablation 6/10 then later discharged, admitted to [facility] for acute respiratory failure from aspiration.   Pt discussed during ICU rounds and with RN and MD.  Eldora Greet with CCM PA who would like to hold off on cortrak for now as pt is having gagging due to secretions.  Pt on HFNC at time of visit 4 L, currently 30 L. Per CCM pt at high risk for requiring re-intubation.    6/11 - intubated, CTA with R segmental PE 6/12 - extubated but has significant secretions   Spoke with daughter who is at bedside. Per daughter pt lives at home with her. Uses walker to get around which is limited due to knee pain. Pt with good appetite PTA. Breakfast is egg with toast, Lunch is a full sandwich and chips, Dinner is meat, veggie, and starch.  Pt is missing teeth though does have dentures. Per granddaughter who is a SLP pt appeared to have swallowing issues more recently.  No recent weight changes per family. They do note that pt does have swelling in feet usually.   Medications reviewed and include: pepcid , lasix , SSI every 4 hours, solumedrol  Labs reviewed:  A1C 5.9 CBG's: 98-126  NUTRITION - FOCUSED PHYSICAL EXAM:  Flowsheet Row Most Recent Value  Orbital Region Moderate depletion  Upper Arm Region No  depletion  Thoracic and Lumbar Region No depletion  Buccal Region Unable to assess  [HFNC]  Temple Region Moderate depletion  Clavicle Bone Region Moderate depletion  Clavicle and Acromion Bone Region Moderate depletion  Scapular Bone Region Unable to assess  Dorsal Hand Unable to assess  Patellar Region Unable to assess  Anterior Thigh Region Unable to assess  Posterior Calf Region Unable to assess  Edema (RD Assessment) Mild  Hair Reviewed  Eyes Unable to assess  Mouth Unable to assess  [missing teeth]  Skin Reviewed  Nails Reviewed    Diet Order:   Diet Order             Diet NPO time specified Except for: Ice Chips  Diet effective now                   EDUCATION NEEDS:   Education needs have been addressed  Skin:  Skin Assessment: Reviewed RN Assessment (R arm skin tear)  Last BM:  unknown  Height:   Ht Readings from Last 1 Encounters:  04/29/24 5' 3 (1.6 m)    Weight:   Wt Readings from Last 1 Encounters:  05/01/24 95.6 kg   BMI:  Body mass index is 37.33 kg/m.  Estimated Nutritional Needs:   Kcal:  1400-1600  Protein:  75-90 grams  Fluid:  >1.5  L/day  Randine Butcher., RD, LDN, CNSC See AMiON for contact information

## 2024-05-01 NOTE — Progress Notes (Signed)
 NAMELaila [name], MRN:  [id number], DOB:  02-21-1933, LOS: 2 ADMISSION DATE:  04/29/2024, CONSULTATION DATE: 04/29/2024 REFERRING MD: Onetha Bile, MD, CHIEF COMPLAINT: AMS for 4 hrs  History of Present Illness:  An 89 yr old female patient with PPM (8 weeks ago), PAF (failed cardioversion twice in March 2025), HTN, COPD, GERD, TIA, and hyperlipidemia. She underwent Afib ablation yesterday noon time and discharged home. At 9:15 pm, she was weak, moaning, altered, and had a weak pulse and syncope. She never lost a pulse or had apnea. EMS arrived and she was intubated due to low GCS 4. Amiodarone  was d/c 2 months ago. Compliant with meds. No hx of active smoking (but second hand smoking), illicit drug use, or alcohol drinking. Given one dose of Vanco and Zosyn . Started on Levophed  due to hypotension. Medical Rx for hyperkalemia was given in ED.   Pertinent  Medical History  PPM (April 2025), PAF (failed cardioversion twice March 2025), HTN, COPD, GERD, TIA, hyperlipidemia Significant Hospital Events: Including procedures, antibiotic start and stop dates in addition to other pertinent events   6/11: intubated, seen in ED, admit to ICU, CTA significant for R segmental PE 6/12 stable, SBT/SAT 6/13 extubated yesterday, has significant secretions and mucous plugging on 7L   Interim History / Subjective:  In some respiratory distress this AM on 7L with significant secretions and mucous plugging  Transitioned to HFNC   Objective    Blood pressure (!) 118/58, pulse 89, temperature 99 F (37.2 C), resp. rate 19, height 5' 3 (1.6 m), weight 95.6 kg, SpO2 97%.    Vent Mode: CPAP;PSV FiO2 (%):  [40 %-55 %] 55 % Set Rate:  [20 bmp] 20 bmp Vt Set:  [420 mL] 420 mL PEEP:  [5 cmH20-15 cmH20] 5 cmH20 Pressure Support:  [5 cmH20-10 cmH20] 5 cmH20   Intake/Output Summary (Last 24 hours) at 05/01/2024 1123 Last data filed at 05/01/2024 1000 Gross per 24 hour  Intake 269.2 ml  Output 450 ml   Net -180.8 ml   Filed Weights   04/29/24 0300 05/01/24 0349  Weight: 91.9 kg 95.6 kg    General:  well nourished elderly F awake and in mild/moderate respiratory distress  HEENT: MM pink/moist, sclera anicteric Neuro: alert and anxious, following commands and answering questions appropriately  CV: s1s2 rrr, no m/r/g PULM:  wheezing and ronchi throughout, tachypneic with significant secretions on 7L GI: soft, bsx4 active  Extremities: warm/dry, trace edema  Skin: no rashes or lesions    Resolved problem list   AKI   Assessment and Plan     Acute hypoxic resp failure secondary to aspiration Pneumonia and R segmental PE Baseline COPD R pleural effusion -extubated yesterday, mucous plugging with worsening R sided white out this AM --start yupelri , brovana , pulmicort , schedule albuterol  -NTS, chest PT, IS and flutter valve, start solumedrol, Lasix  40mg  IV daily, transition to HFNC, at risk for re-intubation  -PE segmental, no indication for lytics or IR consult, continue Lovenox  -continue zosyn  -did not pass swallow screen, am concerned that if we try to place cortrak it will exacerbate coughing/gagging and anxiety, will hold for now  -had a thoracentesis recently that was transudative and cytology was negative -family to discuss with patient if she would want re-intubation as able      S/p Afib ablation 04/28/2024 PPM, HTN, dyslipidemia -continue Labetalol  200mg  bid as able to tolerate po medications   Slightly elevated LFT -Monitor    HyperK -stable, continue to follow  GERD  -H2B    Best Practice (right click and Reselect all SmartList Selections daily)   Diet/type: NPO w/ oral meds DVT prophylaxis systemic dose LMWH Pressure ulcer(s): N/A GI prophylaxis: H2B Lines: N/A Foley:  Yes, and it is still needed Code Status:  full code Last date of multidisciplinary goals of care discussion [family updated at the bedside 6/12]  Labs   CBC: Recent  Labs  Lab 04/29/24 0031 04/29/24 0428 04/29/24 1539 04/30/24 0606 05/01/24 0707  WBC 15.6*  --  15.4* 12.3* 13.6*  NEUTROABS 10.6*  --   --   --  11.2*  HGB 11.9* 11.6* 10.9* 10.9* 10.4*  HCT 40.6 34.0* 35.4* 34.6* 34.7*  MCV 92.7  --  87.2 87.2 89.9  PLT 309  --  243 212 234    Basic Metabolic Panel: Recent Labs  Lab 04/29/24 0031 04/29/24 0428 04/29/24 0551 04/29/24 1539 04/30/24 0606 05/01/24 0707  NA 135 136 138 139 140 142  K 5.9* 4.4 5.3* 4.9 4.2 3.9  CL 101  --  103 104 106 105  CO2 26  --  24 28 23 24   GLUCOSE 205*  --  138* 113* 95 103*  BUN 22  --  19 21 24* 23  CREATININE 1.20*  --  1.04* 1.15* 1.10* 0.98  CALCIUM  8.7*  --  8.9 9.1 8.7* 9.1  MG 2.2  --   --   --  1.8 1.8  PHOS  --   --   --   --  3.5 4.2   GFR: Estimated Creatinine Clearance: 42.8 mL/min (by C-G formula based on SCr of 0.98 mg/dL). Recent Labs  Lab 04/29/24 0029 04/29/24 0031 04/29/24 0553 04/29/24 1539 04/30/24 0606 05/01/24 0707  WBC  --  15.6*  --  15.4* 12.3* 13.6*  LATICACIDVEN 2.2*  --  1.8 1.5  --   --     Liver Function Tests: Recent Labs  Lab 04/29/24 0031 04/29/24 1539 04/30/24 0606  AST 66* 40 29  ALT 63* 67* 51*  ALKPHOS 97 78 61  BILITOT 0.6 1.0 0.8  PROT 6.0* 5.4* 5.1*  ALBUMIN 3.4* 2.9* 2.8*   Recent Labs  Lab 04/29/24 0031  LIPASE 37   No results for input(s): AMMONIA in the last 168 hours.  ABG    Component Value Date/Time   PHART 7.39 04/30/2024 0541   PCO2ART 42 04/30/2024 0541   PO2ART 79 (L) 04/30/2024 0541   HCO3 25.4 04/30/2024 0541   TCO2 26 04/29/2024 0428   ACIDBASEDEF 1.0 04/29/2024 0428   O2SAT 96.7 04/30/2024 0541     Coagulation Profile: No results for input(s): INR, PROTIME in the last 168 hours.  Cardiac Enzymes: No results for input(s): CKTOTAL, CKMB, CKMBINDEX, TROPONINI in the last 168 hours.  HbA1C: Hgb A1c MFr Bld  Date/Time Value Ref Range Status  04/29/2024 05:51 AM 5.9 (H) 4.8 - 5.6 % Final     Comment:    (NOTE) Diagnosis of Diabetes The following HbA1c ranges recommended by the American Diabetes Association (ADA) may be used as an aid in the diagnosis of diabetes mellitus.  Hemoglobin             Suggested A1C NGSP%              Diagnosis  <5.7                   Non Diabetic  5.7-6.4  Pre-Diabetic  >6.4                   Diabetic  <7.0                   Glycemic control for                       adults with diabetes.    09/13/2020 12:05 PM 5.8 (H) 4.8 - 5.6 % Final    Comment:    (NOTE)         Prediabetes: 5.7 - 6.4         Diabetes: >6.4         Glycemic control for adults with diabetes: <7.0     CBG: Recent Labs  Lab 04/30/24 1557 04/30/24 1950 04/30/24 2348 05/01/24 0335 05/01/24 0730  GLUCAP 108* 111* 110* 104* 98    Review of Systems:   Intubated   Past Medical History:  She,  has a past medical history of Arthritis, Atrial fibrillation, transient (HCC), Closed fracture of right fibula and tibia (01/13/2023), Diverticulitis, Dyspnea, GERD (gastroesophageal reflux disease), Hyperlipidemia, Hypertension, Stroke (HCC), and TIA (transient ischemic attack).   Surgical History:   Past Surgical History:  Procedure Laterality Date   AV NODE ABLATION N/A 04/28/2024   Procedure: AV NODE ABLATION;  Surgeon: Efraim Grange, MD;  Location: [facility];  Service: Cardiovascular;  Laterality: N/A;   CARDIOVERSION N/A 01/21/2024   Procedure: CARDIOVERSION;  Surgeon: Olinda Bertrand, DO;  Location: [facility];  Service: Cardiovascular;  Laterality: N/A;   CARDIOVERSION N/A 02/11/2024   Procedure: CARDIOVERSION;  Surgeon: Hugh Madura, MD;  Location: [facility];  Service: Cardiovascular;  Laterality: N/A;   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/13/2020   Procedure: CONVERSION TO LEFT TOTAL HIP-POSTERIOR APPROACH;  Surgeon: Claiborne Crew, MD;  Location: [facility];  Service: Orthopedics;  Laterality: Left;  90 mins   HIP FRACTURE  SURGERY Left 04/16/2020   PACEMAKER IMPLANT N/A 03/16/2024   Procedure: PACEMAKER IMPLANT;  Surgeon: Efraim Grange, MD;  Location: [facility];  Service: Cardiovascular;  Laterality: N/A;   TIBIA IM NAIL INSERTION Right 01/15/2023   Procedure: INTRAMEDULLARY (IM) NAIL  RIGHT TIBIAL;  Surgeon: Hardy Lia, MD;  Location: [facility];  Service: Orthopedics;  Laterality: Right;     Social History:   reports that she has never smoked. She has never used smokeless tobacco. She reports that she does not drink alcohol and does not use drugs.   Family History:  Her family history includes Diabetes in her mother.   Allergies Allergies  Allergen Reactions   Keflex [Cephalexin] Other (See Comments)    makes pt feel really bad  Tolerated Cephalosporin Date: 01/15/23.     Morphine  Anxiety and Other (See Comments)    Pt states this made her feel like she was going to die     Home Medications  Prior to Admission medications   Medication Sig Start Date End Date Taking? Authorizing Provider  acetaminophen  (TYLENOL ) 500 MG tablet Take 2 tablets (1,000 mg total) by mouth every 8 (eight) hours. 09/16/20  Yes Babish, Zoila Hines, PA-C  albuterol  (VENTOLIN  HFA) 108 (90 Base) MCG/ACT inhaler Inhale 2 puffs into the lungs every 4 (four) hours as needed for wheezing or shortness of breath. 06/08/21  Yes [provider]  apixaban  (ELIQUIS ) 5 MG TABS tablet Take 1 tablet (5 mg total) by mouth 2 (two)  times daily. 03/20/24  Yes Mealor, Augustus E, MD  Ascorbic Acid  (VITAMIN C ) 1000 MG tablet Take 1,000 mg by mouth daily.   Yes [provider]  cetirizine (ZYRTEC) 10 MG tablet Take 10 mg by mouth daily.    Yes [provider]  Cholecalciferol  (VITAMIN D3) 50 MCG (2000 UT) TABS Take 2,000 Units by mouth daily.   Yes [provider]  cyanocobalamin  1000 MCG tablet Take 1,000 mcg by mouth every other day. Vitamin B12   Yes [provider]  diclofenac  Sodium  (VOLTAREN ) 1 % GEL Apply 2 g topically daily as needed (joint pain).   Yes [provider]  diltiazem  (CARDIZEM  CD) 300 MG 24 hr capsule Take 300 mg by mouth daily.   Yes [provider]  diltiazem  (CARDIZEM ) 30 MG tablet Take 30 mg by mouth as directed. Take one (30mg ) capsule by mouth as needed for A-Fib. 03/19/24  Yes [provider]  escitalopram (LEXAPRO) 5 MG tablet Take 5 mg by mouth daily. 03/19/24  Yes [provider]  ferrous sulfate  325 (65 FE) MG tablet Take 325 mg by mouth 2 (two) times daily with a meal.   Yes [provider]  furosemide  (LASIX ) 80 MG tablet Take 1 tablet (80 mg total) by mouth daily. 01/28/24 09/18/24 Yes Croitoru, Mihai, MD  gabapentin  (NEURONTIN ) 300 MG capsule Take 300-600 mg by mouth See admin instructions. Take one capsule (300 mg) in the morning and two capsules (600 mg) in the evening.   Yes [provider]  hydroxypropyl methylcellulose / hypromellose (ISOPTO TEARS / GONIOVISC) 2.5 % ophthalmic solution Place 1 drop into both eyes daily as needed for dry eyes.   Yes [provider]  ipratropium-albuterol  (DUONEB) 0.5-2.5 (3) MG/3ML SOLN Take 3 mLs by nebulization every 6 (six) hours as needed (shortness of breath). 12/04/22  Yes Leona Rake, MD  labetalol  (NORMODYNE ) 200 MG tablet Take 200 mg by mouth 2 (two) times daily.   Yes [provider]  Magnesium  400 MG TABS Take 400 mg by mouth daily.   Yes [provider]  methocarbamol  (ROBAXIN ) 750 MG tablet Take 750-1,500 mg by mouth See admin instructions. Take 750 mg in the morning and 1500 mg in the PM 03/06/22  Yes [provider]  mirtazapine  (REMERON ) 15 MG tablet Take 15 mg by mouth at bedtime. 02/27/22  Yes [provider]  Multiple Vitamin (MULTIVITAMIN WITH MINERALS) TABS tablet Take 2 tablets by mouth daily. Plus  Omega 3 gummy   Yes [provider]  Multiple Vitamins-Minerals (PRESERVISION AREDS 2 PO)  Take 1 tablet by mouth 2 (two) times daily. [employer], Historical, MD  Omega-3 Fatty Acids (OMEGA-3 FISH OIL) 1200 MG CAPS Take 1,200 mg by mouth daily.   Yes [provider]  pantoprazole  (PROTONIX ) 40 MG tablet Take 40 mg by mouth daily.   Yes [provider]  potassium chloride  SA (KLOR-CON  M20) 20 MEQ tablet Take 1 tablet (20 mEq total) by mouth 3 (three) times daily. Patient taking differently: Take 20 mEq by mouth 2 (two) times daily. 02/19/24  Yes Fenton, Clint R, PA  pravastatin  (PRAVACHOL ) 20 MG tablet Take 20 mg by mouth at bedtime.   Yes [provider]  sucralfate  (CARAFATE ) 1 g tablet Take 1 g by mouth with breakfast, with lunch, and with evening meal.   Yes [provider]     Critical care time: 35 min    CRITICAL CARE Performed  by: Patt Boozer Sharran Caratachea   Total critical care time: 35 minutes  Critical care time was exclusive of separately billable procedures and treating other patients.  Critical care was necessary to treat or prevent imminent or life-threatening deterioration.  Critical care was time spent personally by me on the following activities: development of treatment plan with patient and/or surrogate as well as nursing, discussions with consultants, evaluation of patient's response to treatment, examination of patient, obtaining history from patient or surrogate, ordering and performing treatments and interventions, ordering and review of laboratory studies, ordering and review of radiographic studies, pulse oximetry and re-evaluation of patient's condition.  Patt Boozer Juliona Vales, PA-C [facility] See Amion for pager If no response to pager , please call [phone] until 7pm After 7:00 pm call Elink  [phone]

## 2024-05-01 NOTE — Plan of Care (Signed)

## 2024-05-02 ENCOUNTER — Inpatient Hospital Stay (HOSPITAL_COMMUNITY)

## 2024-05-02 DIAGNOSIS — J690 Pneumonitis due to inhalation of food and vomit: Secondary | ICD-10-CM | POA: Diagnosis not present

## 2024-05-02 DIAGNOSIS — J9601 Acute respiratory failure with hypoxia: Secondary | ICD-10-CM | POA: Diagnosis not present

## 2024-05-02 DIAGNOSIS — J449 Chronic obstructive pulmonary disease, unspecified: Secondary | ICD-10-CM | POA: Diagnosis not present

## 2024-05-02 DIAGNOSIS — I2694 Multiple subsegmental pulmonary emboli without acute cor pulmonale: Secondary | ICD-10-CM | POA: Diagnosis not present

## 2024-05-02 LAB — RENAL FUNCTION PANEL
Albumin: 3 g/dL — ABNORMAL LOW (ref 3.5–5.0)
Anion gap: 11 (ref 5–15)
BUN: 25 mg/dL — ABNORMAL HIGH (ref 8–23)
CO2: 27 mmol/L (ref 22–32)
Calcium: 9.5 mg/dL (ref 8.9–10.3)
Chloride: 104 mmol/L (ref 98–111)
Creatinine, Ser: 0.97 mg/dL (ref 0.44–1.00)
GFR, Estimated: 56 mL/min — ABNORMAL LOW (ref >60)
Glucose, Bld: 108 mg/dL — ABNORMAL HIGH (ref 70–99)
Phosphorus: 3.4 mg/dL (ref 2.5–4.6)
Potassium: 3.9 mmol/L (ref 3.5–5.1)
Sodium: 142 mmol/L (ref 135–145)

## 2024-05-02 LAB — GLUCOSE, CAPILLARY
Glucose-Capillary: 110 mg/dL — ABNORMAL HIGH (ref 70–99)
Glucose-Capillary: 122 mg/dL — ABNORMAL HIGH (ref 70–99)
Glucose-Capillary: 115 mg/dL — ABNORMAL HIGH (ref 70–99)
Glucose-Capillary: 133 mg/dL — ABNORMAL HIGH (ref 70–99)
Glucose-Capillary: 135 mg/dL — ABNORMAL HIGH (ref 70–99)
Glucose-Capillary: 110 mg/dL — ABNORMAL HIGH (ref 70–99)

## 2024-05-02 LAB — MAGNESIUM: Magnesium: 2 mg/dL (ref 1.7–2.4)

## 2024-05-02 MED ORDER — LABETALOL HCL 100 MG PO TABS
200.0000 mg | ORAL_TABLET | Freq: Two times a day (BID) | ORAL | Status: DC
  Administered 2024-05-02 – 2024-05-03 (×2): 200 mg via ORAL
  Filled 2024-05-02: qty 2

## 2024-05-02 MED ORDER — METHYLPREDNISOLONE SODIUM SUCC 40 MG IJ SOLR
40.0000 mg | Freq: Every day | INTRAMUSCULAR | Status: DC
  Administered 2024-05-03: 40 mg via INTRAVENOUS
  Filled 2024-05-02: qty 1

## 2024-05-02 MED ORDER — ESCITALOPRAM OXALATE 10 MG PO TABS
5.0000 mg | ORAL_TABLET | Freq: Every day | ORAL | Status: DC
  Administered 2024-05-02 – 2024-05-03 (×2): 5 mg via ORAL
  Filled 2024-05-02 (×2): qty 1

## 2024-05-02 MED ORDER — QUETIAPINE FUMARATE 25 MG PO TABS
50.0000 mg | ORAL_TABLET | Freq: Every day | ORAL | Status: DC
  Administered 2024-05-02: 50 mg via ORAL
  Filled 2024-05-02: qty 2

## 2024-05-02 MED ORDER — GABAPENTIN 250 MG/5ML PO SOLN
600.0000 mg | Freq: Every day | ORAL | Status: DC
  Administered 2024-05-02: 600 mg via ORAL
  Filled 2024-05-02 (×2): qty 12

## 2024-05-02 MED ORDER — ACETAMINOPHEN 325 MG PO TABS: 650.0000 mg | ORAL_TABLET | Freq: Four times a day (QID) | ORAL | Status: DC | PRN

## 2024-05-02 MED ORDER — DEXMEDETOMIDINE HCL IN NACL 400 MCG/100ML IV SOLN
0.0000 ug/kg/h | INTRAVENOUS | Status: DC
  Administered 2024-05-02: 0.4 ug/kg/h via INTRAVENOUS
  Administered 2024-05-03: 0.5 ug/kg/h via INTRAVENOUS
  Filled 2024-05-02 (×3): qty 100

## 2024-05-02 MED ORDER — GABAPENTIN 250 MG/5ML PO SOLN
300.0000 mg | Freq: Every day | ORAL | Status: DC
  Administered 2024-05-02 – 2024-05-03 (×2): 300 mg via ORAL
  Filled 2024-05-02 (×2): qty 6

## 2024-05-02 MED ORDER — LABETALOL HCL 100 MG PO TABS: 200.0000 mg | ORAL_TABLET | Freq: Two times a day (BID) | ORAL | Status: DC

## 2024-05-02 NOTE — Progress Notes (Signed)
 NAMEShaniyah [name], MRN:  [id number], DOB:  1933-03-09, LOS: 3 ADMISSION DATE:  04/29/2024, CONSULTATION DATE: 04/29/2024 REFERRING MD: Onetha Bile, MD, CHIEF COMPLAINT: AMS for 4 hrs  History of Present Illness:  An 89 yr old female patient with PPM (8 weeks ago), PAF (failed cardioversion twice in March 2025), HTN, COPD, GERD, TIA, and hyperlipidemia. She underwent Afib ablation yesterday noon time and discharged home. At 9:15 pm, she was weak, moaning, altered, and had a weak pulse and syncope. She never lost a pulse or had apnea. EMS arrived and she was intubated due to low GCS 4. Amiodarone  was d/c 2 months ago. Compliant with meds. No hx of active smoking (but second hand smoking), illicit drug use, or alcohol drinking. Given one dose of Vanco and Zosyn . Started on Levophed  due to hypotension. Medical Rx for hyperkalemia was given in ED.   Pertinent  Medical History  PPM (April 2025), PAF (failed cardioversion twice March 2025), HTN, COPD, GERD, TIA, hyperlipidemia Significant Hospital Events: Including procedures, antibiotic start and stop dates in addition to other pertinent events   6/11: intubated, seen in ED, admit to ICU, CTA significant for R segmental PE 6/12 stable, SBT/SAT 6/13 extubated yesterday, has significant secretions and mucous plugging on 7L   Interim History / Subjective:  In some respiratory distress this AM on 7L with significant secretions and mucous plugging  Transitioned to HFNC   Objective    Blood pressure (!) 174/91, pulse 90, temperature (!) 96.5 F (35.8 C), temperature source Axillary, resp. rate 16, height 5' 3 (1.6 m), weight 94 kg, SpO2 97%.    FiO2 (%):  [45 %-55 %] 50 %   Intake/Output Summary (Last 24 hours) at 05/02/2024 0902 Last data filed at 05/02/2024 0600 Gross per 24 hour  Intake 209.71 ml  Output 1475 ml  Net -1265.29 ml   Filed Weights   04/29/24 0300 05/01/24 0349 05/02/24 0409  Weight: 91.9 kg 95.6 kg 94 kg    Frail  elderly woman on HHFNC Agitated and restless Tachycardic regular Breath sounds diminished L>R with intermittent rhonchi No peripheral edema Good cough  Moves all 4 extremities   Resolved problem list   AKI   Assessment and Plan   Acute hypoxic resp failure secondary to aspiration Pneumonia and R segmental PE Baseline COPD, passive smoke exposure - continue ceftriaxone  for 5 days for pneumonia - continue bronchodilators - continue HTS nebs and chest pt and flutter valve and prn NTS - will have SLP re-evaluate for swallow function today per family request, otherwise they are amenable to tube placement for nutrition - remains high risk for reintubation and requires ICU level monitoring  S/p Afib ablation 04/28/2024 PPM, HTN, dyslipidemia - currently on prn labetalol  due to NPO status - resume enteral labetalol  as able  Slightly elevated LFT -Monitor   HyperK -stable, continue to follow   GERD  -H2B    Best Practice (right click and Reselect all SmartList Selections daily)   Diet/type: NPO w/ oral meds DVT prophylaxis systemic dose LMWH Pressure ulcer(s): N/A GI prophylaxis: H2B Lines: N/A Foley:  Yes, and it is still needed Code Status:  full code Last date of multidisciplinary goals of care discussion [family updated at the bedside 6/14]  Labs   CBC: Recent Labs  Lab 04/29/24 0031 04/29/24 0428 04/29/24 1539 04/30/24 0606 05/01/24 0707  WBC 15.6*  --  15.4* 12.3* 13.6*  NEUTROABS 10.6*  --   --   --  11.2*  HGB 11.9* 11.6* 10.9* 10.9* 10.4*  HCT 40.6 34.0* 35.4* 34.6* 34.7*  MCV 92.7  --  87.2 87.2 89.9  PLT 309  --  243 212 234    Basic Metabolic Panel: Recent Labs  Lab 04/29/24 0031 04/29/24 0428 04/29/24 0551 04/29/24 1539 04/30/24 0606 05/01/24 0707 05/02/24 0453  NA 135   < > 138 139 140 142 142  K 5.9*   < > 5.3* 4.9 4.2 3.9 3.9  CL 101  --  103 104 106 105 104  CO2 26  --  24 28 23 24 27   GLUCOSE 205*  --  138* 113* 95 103* 108*   BUN 22  --  19 21 24* 23 25*  CREATININE 1.20*  --  1.04* 1.15* 1.10* 0.98 0.97  CALCIUM  8.7*  --  8.9 9.1 8.7* 9.1 9.5  MG 2.2  --   --   --  1.8 1.8 2.0  PHOS  --   --   --   --  3.5 4.2 3.4   < > = values in this interval not displayed.   GFR: Estimated Creatinine Clearance: 42.8 mL/min (by C-G formula based on SCr of 0.97 mg/dL). Recent Labs  Lab 04/29/24 0029 04/29/24 0031 04/29/24 0553 04/29/24 1539 04/30/24 0606 05/01/24 0707  WBC  --  15.6*  --  15.4* 12.3* 13.6*  LATICACIDVEN 2.2*  --  1.8 1.5  --   --     Liver Function Tests: Recent Labs  Lab 04/29/24 0031 04/29/24 1539 04/30/24 0606 05/02/24 0453  AST 66* 40 29  --   ALT 63* 67* 51*  --   ALKPHOS 97 78 61  --   BILITOT 0.6 1.0 0.8  --   PROT 6.0* 5.4* 5.1*  --   ALBUMIN 3.4* 2.9* 2.8* 3.0*   Recent Labs  Lab 04/29/24 0031  LIPASE 37   No results for input(s): AMMONIA in the last 168 hours.  ABG    Component Value Date/Time   PHART 7.39 04/30/2024 0541   PCO2ART 42 04/30/2024 0541   PO2ART 79 (L) 04/30/2024 0541   HCO3 25.4 04/30/2024 0541   TCO2 26 04/29/2024 0428   ACIDBASEDEF 1.0 04/29/2024 0428   O2SAT 96.7 04/30/2024 0541     Coagulation Profile: No results for input(s): INR, PROTIME in the last 168 hours.  Cardiac Enzymes: No results for input(s): CKTOTAL, CKMB, CKMBINDEX, TROPONINI in the last 168 hours.  HbA1C: Hgb A1c MFr Bld  Date/Time Value Ref Range Status  04/29/2024 05:51 AM 5.9 (H) 4.8 - 5.6 % Final    Comment:    (NOTE) Diagnosis of Diabetes The following HbA1c ranges recommended by the American Diabetes Association (ADA) may be used as an aid in the diagnosis of diabetes mellitus.  Hemoglobin             Suggested A1C NGSP%              Diagnosis  <5.7                   Non Diabetic  5.7-6.4                Pre-Diabetic  >6.4                   Diabetic  <7.0                   Glycemic control for  adults with diabetes.     09/13/2020 12:05 PM 5.8 (H) 4.8 - 5.6 % Final    Comment:    (NOTE)         Prediabetes: 5.7 - 6.4         Diabetes: >6.4         Glycemic control for adults with diabetes: <7.0     CBG: Recent Labs  Lab 05/01/24 1142 05/01/24 1534 05/01/24 1939 05/02/24 0321 05/02/24 0738  GLUCAP 113* 126* 122* 110* 122*    Critical care time:    The patient is critically ill due to respiratory failure.  Critical care was necessary to treat or prevent imminent or life-threatening deterioration.  Critical care was time spent personally by me on the following activities: development of treatment plan with patient and/or surrogate as well as nursing, discussions with consultants, evaluation of patient's response to treatment, examination of patient, obtaining history from patient or surrogate, ordering and performing treatments and interventions, ordering and review of laboratory studies, ordering and review of radiographic studies, pulse oximetry, re-evaluation of patient's condition and participation in multidisciplinary rounds.   Critical Care Time devoted to patient care services described in this note is 38 minutes. This time reflects time of care of this signee Alexis Reber S [place] . This critical care time does not reflect separately billable procedures or procedure time, teaching time or supervisory time of PA/NP/Med student/Med Resident etc but could involve care discussion time.       Aleck Hurdle [facility] 05/02/2024 9:02 AM  Pager: see AMION  If no response to pager , please call critical care on call (see AMION) until 7pm After 7:00 pm call Elink

## 2024-05-02 NOTE — Evaluation (Addendum)
 Physical Therapy Evaluation Patient Details Name: [name] MRN: [id number] DOB: 1933-03-07 Today's Date: 05/02/2024  History of Present Illness  Pt is an 89 y.o. female who presented 04/29/24 with weakness, AMS, weak pulse, and syncope episode. Admitted with acute hypoxic respiratory failure secondary to aspiration pneumonia and R segmental PE. ETT 6/11 - 6/12. Of note, underwent Afib ablation 6/10. PMH: HTN, prior TIA/stroke, hyperlipidemia, GERD, paroxysmal afib, diastolic heart failure, COPD, obesity   Clinical Impression  Pt presents with condition above and deficits mentioned below, see PT Problem List. PTA, she was mod I utilizing a RW for household distance mobility and a w/c for community mobility. She lives with her daughter and son-in-law in a multi-level house with 1 STE. She stays on the main level. Currently, the pt is demonstrating deficits in pulmonary function, cognition (educated daughter on delirium precautions), gross strength, balance, power, and activity tolerance. She is currently requiring minA for bed mobility and modA to transfer to stand and take slow, unsteady pivotal steps with HHA. She is at high risk for falls and has had a drastic functional decline. She has great family support at d/c and may benefit from intensive inpatient rehab, > 3 hours/day. Will continue to follow acutely.      If plan is discharge home, recommend the following: Two people to help with walking and/or transfers;A lot of help with bathing/dressing/bathroom;Assistance with cooking/housework;Direct supervision/assist for medications management;Direct supervision/assist for financial management;Assist for transportation;Help with stairs or ramp for entrance;Supervision due to cognitive status   Can travel by private vehicle        Equipment Recommendations None recommended by PT  Recommendations for Other Services  Rehab consult;OT consult    Functional Status Assessment Patient has had a  recent decline in their functional status and demonstrates the ability to make significant improvements in function in a reasonable and predictable amount of time.     Precautions / Restrictions Precautions Precautions: Fall;Other (comment) Recall of Precautions/Restrictions: Impaired Precaution/Restrictions Comments: watch SpO2 (on HHFNC); watch BP Restrictions Weight Bearing Restrictions Per Provider Order: No      Mobility  Bed Mobility Overal bed mobility: Needs Assistance Bed Mobility: Supine to Sit     Supine to sit: Min assist, HOB elevated, Used rails     General bed mobility comments: Extra time and cues for pt to bring bil legs off L EOB with minA at trunk to sit up L EOB from elevated HOB.    Transfers Overall transfer level: Needs assistance Equipment used: 1 person hand held assist Transfers: Sit to/from Stand, Bed to chair/wheelchair/BSC Sit to Stand: Mod assist   Step pivot transfers: Mod assist       General transfer comment: Bil knees blocked for safety with cues for pt to hold onto therapist anterior to her to pull up and steady herself with transferring to stand and step pivoting to the R from the bed to the recliner. Extra time and multi-modal cues provided to advance steps, needing assistance and cues for weight shifting bil    Ambulation/Gait Ambulation/Gait assistance: Mod assist Gait Distance (Feet): 2 Feet Assistive device: 1 person hand held assist Gait Pattern/deviations: Step-to pattern, Decreased step length - right, Decreased step length - left, Decreased stride length, Decreased weight shift to right, Decreased weight shift to left, Shuffle, Trunk flexed Gait velocity: reduced Gait velocity interpretation: <1.31 ft/sec, indicative of household ambulator   General Gait Details: Pt taking pivotal steps to the R from the bed to the recliner with bil  UE support on therapist anterior to her and intermittent knee block for safety. Extra time and  multi-modal cues provided to advance steps, needing assistance and cues for weight shifting bil  Stairs            Wheelchair Mobility     Tilt Bed    Modified Rankin (Stroke Patients Only)       Balance Overall balance assessment: Needs assistance Sitting-balance support: No upper extremity supported, Feet supported Sitting balance-Leahy Scale: Fair Sitting balance - Comments: CGA for safety sitting statically EOB   Standing balance support: Bilateral upper extremity supported, During functional activity Standing balance-Leahy Scale: Poor Standing balance comment: reliant on UE support and modA, flexed posture noted                             Pertinent Vitals/Pain Pain Assessment Pain Assessment: Faces Faces Pain Scale: Hurts even more Pain Location: generalized moaning with rest and movement Pain Descriptors / Indicators: Discomfort, Grimacing, Moaning Pain Intervention(s): Limited activity within patient's tolerance, Monitored during session, Repositioned    Home Living Family/patient expects to be discharged to:: Private residence Living Arrangements: Children (daughter and son-in-law (who is ED MD)) Available Help at Discharge: Family;Available 24 hours/day Type of Home: House Home Access: Stairs to enter Entrance Stairs-Rails: None Entrance Stairs-Number of Steps: 1   Home Layout: Multi-level;Able to live on main level with bedroom/bathroom (pt does not go upstairs) Home Equipment: Shower seat;BSC/3in1;Grab bars - tub/shower;Hand held shower head;Rollator (4 wheels);Rolling Walker (2 wheels);Wheelchair - manual      Prior Function Prior Level of Function : Independent/Modified Independent;Needs assist             Mobility Comments: Pt uses a RW for household distances and a wheelchair for community mobility ADLs Comments: Independent to Mod I with dressing, grooming, and toileting. CGA for shower transfers and otherwise Mod I to  Supervision for bathing. Daughter assist with medication management, home management tasks, and transportation     Extremity/Trunk Assessment   Upper Extremity Assessment Upper Extremity Assessment: Defer to OT evaluation    Lower Extremity Assessment Lower Extremity Assessment: Generalized weakness (hx of OA per daughter, crepitus noted with weight bearing bil; reports intact sensation bil)    Cervical / Trunk Assessment Cervical / Trunk Assessment: Kyphotic  Communication   Communication Communication: Impaired Factors Affecting Communication: Hearing impaired;Difficulty expressing self (seemed slow to express herself)    Cognition Arousal: Alert (closes eyes but remains awake) Behavior During Therapy: Flat affect   PT - Cognitive impairments: Orientation, Awareness, Memory, Attention, Initiation, Problem solving, Sequencing, Safety/Judgement   Orientation impairments: Place, Time, Situation                   PT - Cognition Comments: Pt oriented to being at a hospital but unable to state which one. Thinks it is July. Is not sure why she is here. Needs prompting to identify she is here due to pulmonary issues. Slow to process and respond to all cues, often needing them repeated. Decreased sequencing and problem solving Following commands: Impaired Following commands impaired: Follows one step commands inconsistently, Follows one step commands with increased time     Cueing Cueing Techniques: Verbal cues, Tactile cues     General Comments General comments (skin integrity, edema, etc.): Pt noted to be HTN, but in trend for the day's reads per the monitor; SpO2 stable on HHFNC 30L 50% FiO2; HR 90; educated daughter  on delirium precautions    Exercises     Assessment/Plan    PT Assessment Patient needs continued PT services  PT Problem List Decreased strength;Decreased activity tolerance;Decreased balance;Decreased mobility;Cardiopulmonary status limiting activity        PT Treatment Interventions Gait training;DME instruction;Stair training;Functional mobility training;Therapeutic exercise;Therapeutic activities;Balance training;Cognitive remediation;Neuromuscular re-education;Patient/family education;Wheelchair mobility training    PT Goals (Current goals can be found in the Care Plan section)  Acute Rehab PT Goals Patient Stated Goal: to get up to the chair PT Goal Formulation: With patient/family Time For Goal Achievement: 05/16/24 Potential to Achieve Goals: Good    Frequency Min 3X/week     Co-evaluation               AM-PAC PT 6 Clicks Mobility  Outcome Measure Help needed turning from your back to your side while in a flat bed without using bedrails?: A Little Help needed moving from lying on your back to sitting on the side of a flat bed without using bedrails?: A Little Help needed moving to and from a bed to a chair (including a wheelchair)?: A Lot Help needed standing up from a chair using your arms (e.g., wheelchair or bedside chair)?: A Lot Help needed to walk in hospital room?: Total Help needed climbing 3-5 steps with a railing? : Total 6 Click Score: 12    End of Session Equipment Utilized During Treatment: Gait belt;Oxygen Activity Tolerance: Patient limited by fatigue Patient left: in chair;with call bell/phone within reach;with chair alarm set;with family/visitor present;with nursing/sitter in room Nurse Communication: Mobility status;Other (comment) (BP) PT Visit Diagnosis: Unsteadiness on feet (R26.81);Other abnormalities of gait and mobility (R26.89);Muscle weakness (generalized) (M62.81);Difficulty in walking, not elsewhere classified (R26.2)    Time: 4098-1191 PT Time Calculation (min) (ACUTE ONLY): 36 min   Charges:   PT Evaluation $PT Eval Moderate Complexity: 1 Mod PT Treatments $Therapeutic Activity: 8-22 mins PT General Charges $$ ACUTE PT VISIT: 1 Visit         Vernida Goodie, PT, DPT Acute  Rehabilitation Services  Office: [phone]   Ellyn Hack 05/02/2024, 4:09 PM

## 2024-05-02 NOTE — Plan of Care (Signed)

## 2024-05-02 NOTE — Procedures (Signed)
 Objective Swallowing Evaluation: Type of Study: FEES-Fiberoptic Endoscopic Evaluation of Swallow   Patient Details  Name: [name] MRN: [id number] Date of Birth: May 19, 1933  Today's Date: 05/02/2024 Time: SLP Start Time (ACUTE ONLY): 1425 -SLP Stop Time (ACUTE ONLY): 1505  SLP Time Calculation (min) (ACUTE ONLY): 40 min   Past Medical History:  Past Medical History:  Diagnosis Date   Arthritis    Atrial fibrillation, transient (HCC)    post op hip surgery; May 2021   Closed fracture of right fibula and tibia 01/13/2023   Diverticulitis    Dyspnea    with exertion    GERD (gastroesophageal reflux disease)    Hyperlipidemia    Hypertension    Stroke (HCC)    hx opf mini strokes    TIA (transient ischemic attack)    Past Surgical History:  Past Surgical History:  Procedure Laterality Date   AV NODE ABLATION N/A 04/28/2024   Procedure: AV NODE ABLATION;  Surgeon: Efraim Grange, MD;  Location: [facility];  Service: Cardiovascular;  Laterality: N/A;   CARDIOVERSION N/A 01/21/2024   Procedure: CARDIOVERSION;  Surgeon: Olinda Bertrand, DO;  Location: [facility];  Service: Cardiovascular;  Laterality: N/A;   CARDIOVERSION N/A 02/11/2024   Procedure: CARDIOVERSION;  Surgeon: Hugh Madura, MD;  Location: [facility];  Service: Cardiovascular;  Laterality: N/A;   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/13/2020   Procedure: CONVERSION TO LEFT TOTAL HIP-POSTERIOR APPROACH;  Surgeon: Claiborne Crew, MD;  Location: [facility];  Service: Orthopedics;  Laterality: Left;  90 mins   HIP FRACTURE SURGERY Left 04/16/2020   PACEMAKER IMPLANT N/A 03/16/2024   Procedure: PACEMAKER IMPLANT;  Surgeon: Efraim Grange, MD;  Location: [facility];  Service: Cardiovascular;  Laterality: N/A;   TIBIA IM NAIL INSERTION Right 01/15/2023   Procedure: INTRAMEDULLARY (IM) NAIL  RIGHT TIBIAL;  Surgeon: Hardy Lia, MD;  Location: [facility];  Service: Orthopedics;  Laterality: Right;    HPI: [name] is an 89 yo female presenting to ED 6/11 with GCS of 4, intubated by EMS. Underwent ablation procedure 6/10. CTA showed small segmental PE, R hilar infiltrate, and mediastinal lymphadenopathy. CXR 6/13 shows increased R basilar opacity concerning for pleural effusion with associated atelectasis or infiltrate. ETT 6/10-6/12. Recent admission in February 2025, during which CXR showed RUL opacity concerning for infection s/p thoracentesis. Seen by SLP 11/27/22 with functional appearing oropharyngeal swallow, recommendations to continue regular diet with thin liquids. PMH includes GERD, PAF (failed cardioversion x2 March 2025), HTN, COPD, TIA, HLD   Subjective: awake, alert, granddaughter in room    Assessment / Plan / Recommendation     05/02/2024    4:56 PM  Clinical Impressions  Clinical Impression Patient tolerated the FEES procedure very well. SLP passed the scope transnasally through the right nare and positioned scope for superior view of pharynx, larynx and trachea. Structures appeared moist and pink throughout with appearance of mild irritation of vocal cords. RN fed patient honey thick liquids, nectar thick liquids, puree solids, thin liquids and ice chips. Swallow triggered at level of vallecular sinus with residuals remaining in vallecula and pooling into pyriform sinus prior to initiation of second swallow. Trace to mild amount of pyriform sinus residuals remained after second swallow was performed. Thin liquid did result in penetration to the vocal cords however there was no green dyed thin liquid observed in laryngeal vestibule, on vocal cords or in trachea just below vocal cords after swallow  was completed. No observed penetration or aspiration with nectar thick, honey thick or puree solids. No observed retrograde movement of PO's through PES observed during this FEES. SLP is recommending initiate PO diet of full liquids/nectar thick. Granddaughter (who is an SLP that has  worked with adults, including dysphagia testing) and patient were educated on results and recommendations for swallowing twice to clear residuals, avoiding impulsive drinking. SLP will follow for toleration and ability to advance.  SLP Visit Diagnosis Dysphagia, oropharyngeal phase (R13.12)  Attention and concentration deficit following --  Frontal lobe and executive function deficit following --  Impact on safety and function Mild aspiration risk         05/02/2024    4:56 PM  Treatment Recommendations  Treatment Recommendations Therapy as outlined in treatment plan below        05/02/2024    5:07 PM  Prognosis  Prognosis for improved oropharyngeal function Good  Barriers to Reach Goals Time post onset;Severity of deficits  Barriers/Prognosis Comment --       05/02/2024    4:56 PM  Diet Recommendations  SLP Diet Recommendations Nectar thick liquid;Other (Comment)  Liquid Administration via --  Medication Administration Crushed with puree  Compensations Slow rate;Small sips/bites  Postural Changes Seated upright at 90 degrees;Remain semi-upright after after feeds/meals (Comment)         05/02/2024    4:56 PM  Other Recommendations  Recommended Consults --  Oral Care Recommendations Oral care BID;Staff/trained caregiver to provide oral care  Caregiver Recommendations --  Follow Up Recommendations Other (comment)  Assistance recommended at discharge --  Functional Status Assessment --       05/02/2024    4:56 PM  Frequency and Duration   Speech Therapy Frequency (ACUTE ONLY) min 2x/week  Treatment Duration 2 weeks         05/02/2024    4:53 PM  Oral Phase  Oral Phase WFL  Oral - Pudding Teaspoon --  Oral - Pudding Cup --  Oral - Honey Teaspoon --  Oral - Honey Cup --  Oral - Nectar Teaspoon --  Oral - Nectar Cup --  Oral - Nectar Straw --  Oral - Thin Teaspoon --  Oral - Thin Cup --  Oral - Thin Straw --  Oral - Puree --  Oral - Mech Soft --  Oral -  Regular --  Oral - Multi-Consistency --  Oral - Pill --  Oral Phase - Comment --       05/02/2024    4:53 PM  Pharyngeal Phase  Pharyngeal Phase Impaired  Pharyngeal- Pudding Teaspoon --  Pharyngeal --  Pharyngeal- Pudding Cup --  Pharyngeal --  Pharyngeal- Honey Teaspoon Delayed swallow initiation-vallecula  Pharyngeal Material does not enter airway  Pharyngeal- Honey Cup --  Pharyngeal --  Pharyngeal- Nectar Teaspoon Delayed swallow initiation-vallecula  Pharyngeal Material does not enter airway  Pharyngeal- Nectar Cup Delayed swallow initiation-vallecula;Pharyngeal residue - valleculae  Pharyngeal --  Pharyngeal- Nectar Straw Delayed swallow initiation-vallecula;Pharyngeal residue - valleculae  Pharyngeal --  Pharyngeal- Thin Teaspoon Delayed swallow initiation-vallecula;Penetration/Aspiration during swallow;Pharyngeal residue - pyriform;Reduced airway/laryngeal closure  Pharyngeal Material enters airway, CONTACTS cords and then ejected out  Pharyngeal- Thin Cup Reduced airway/laryngeal closure;Penetration/Aspiration during swallow;Pharyngeal residue - pyriform  Pharyngeal Material enters airway, CONTACTS cords and then ejected out  Pharyngeal- Thin Straw --  Pharyngeal --  Pharyngeal- Puree Delayed swallow initiation-vallecula  Pharyngeal --  Pharyngeal- Mechanical Soft --  Pharyngeal --  Pharyngeal- Regular --  Pharyngeal --  Pharyngeal- Multi-consistency --  Pharyngeal --  Pharyngeal- Pill --  Pharyngeal --  Pharyngeal Comment --            Jacqualine Mater, MA, CCC-SLP Speech Therapy

## 2024-05-02 NOTE — Progress Notes (Signed)
 Speech Language Pathology Treatment: Dysphagia  Patient Details Name: [name] MRN: [id number] DOB: [birth year] Today's Date: 05/02/2024 Time: 4098-1191 SLP Time Calculation (min) (ACUTE ONLY): 15 min  Assessment / Plan / Recommendation Clinical Impression  Patient seen by SLP for skilled treatment focused on dysphagia goals. Daughter in room. Patient was awake, sitting upright in bed. Daughter reported that patient is very hungry and she is hopeful she will be able to advance to PO's. SLP performed oral care and then assessed swallow at bedside via ice chips and thin liquids (water ) via spoon and cup. Patient with delayed cough which at times was productive of yellowish secretions. SpO2 was generally in low to mid 90% but did decrease to 88% briefly during one coughing episode. In light of patient's continued coughing, h/o GERD and high oxygen requirement, SLP recommending objective swallow study prior to initiating PO's. SLP discussed and got verbal consent from patient's daughter for FEES which can be completed this PM pending MD agreement.    HPI HPI: [name] is an 89 yo female presenting to ED 6/11 with GCS of 4, intubated by EMS. Underwent ablation procedure 6/10. CTA showed small segmental PE, R hilar infiltrate, and mediastinal lymphadenopathy. CXR 6/13 shows increased R basilar opacity concerning for pleural effusion with associated atelectasis or infiltrate. ETT 6/10-6/12. Recent admission in February 2025, during which CXR showed RUL opacity concerning for infection s/p thoracentesis. Seen by SLP 11/27/22 with functional appearing oropharyngeal swallow, recommendations to continue regular diet with thin liquids. PMH includes GERD, PAF (failed cardioversion x2 March 2025), HTN, COPD, TIA, HLD      SLP Plan  Continue with current plan of care;FEES          Recommendations  Diet recommendations: NPO Medication Administration: Crushed with puree                  Oral  care QID;Staff/trained caregiver to provide oral care   Intermittent Supervision/Assistance Dysphagia, unspecified (R13.10)     Continue with current plan of care;FEES    Jacqualine Mater, MA, CCC-SLP Speech Therapy

## 2024-05-02 NOTE — Progress Notes (Signed)
 Inpatient Rehab Admissions Coordinator Note:   Per PT patient was screened for CIR candidacy by Earnestine Shipp Annell Barrow, CCC-SLP. At this time, pt is not medically ready for CIR. Pt is requiring HHFNC 25L, FiO2 50%. CIR admissions team will follow to monitor for medical readiness. A consult order will be placed if pt appears to be an appropriate candidate.   Artemus Larsen, MS, CCC-SLP Admissions Coordinator [phone] 05/02/24 5:53 PM

## 2024-05-03 ENCOUNTER — Inpatient Hospital Stay (HOSPITAL_COMMUNITY)

## 2024-05-03 DIAGNOSIS — J9601 Acute respiratory failure with hypoxia: Secondary | ICD-10-CM | POA: Diagnosis not present

## 2024-05-03 DIAGNOSIS — J690 Pneumonitis due to inhalation of food and vomit: Secondary | ICD-10-CM | POA: Diagnosis not present

## 2024-05-03 DIAGNOSIS — J449 Chronic obstructive pulmonary disease, unspecified: Secondary | ICD-10-CM | POA: Diagnosis not present

## 2024-05-03 DIAGNOSIS — I2694 Multiple subsegmental pulmonary emboli without acute cor pulmonale: Secondary | ICD-10-CM | POA: Diagnosis not present

## 2024-05-03 MED ORDER — SODIUM CHLORIDE 0.9% FLUSH
3.0000 mL | Freq: Two times a day (BID) | INTRAVENOUS | Status: DC
  Administered 2024-05-03: 5 mL via INTRAVENOUS

## 2024-05-03 MED ORDER — GLYCOPYRROLATE 0.2 MG/ML IJ SOLN
0.2000 mg | INTRAMUSCULAR | Status: DC | PRN
  Administered 2024-05-03: 0.2 mg via INTRAVENOUS
  Filled 2024-05-03: qty 1

## 2024-05-03 MED ORDER — MORPHINE BOLUS VIA INFUSION
5.0000 mg | INTRAVENOUS | Status: DC | PRN
  Administered 2024-05-03: 5 mg via INTRAVENOUS

## 2024-05-03 MED ORDER — SODIUM CHLORIDE 0.9% FLUSH
3.0000 mL | Freq: Two times a day (BID) | INTRAVENOUS | Status: DC
  Administered 2024-05-03: 10 mL via INTRAVENOUS

## 2024-05-03 MED ORDER — LORAZEPAM 2 MG/ML IJ SOLN
2.0000 mg | INTRAMUSCULAR | Status: DC | PRN
  Administered 2024-05-03: 2 mg via INTRAVENOUS
  Filled 2024-05-03: qty 1

## 2024-05-03 MED ORDER — LIDOCAINE 5 % EX PTCH
2.0000 | MEDICATED_PATCH | CUTANEOUS | Status: DC
  Administered 2024-05-03: 2 via TRANSDERMAL
  Filled 2024-05-03: qty 2

## 2024-05-03 MED ORDER — SODIUM CHLORIDE 0.9% FLUSH
3.0000 mL | INTRAVENOUS | Status: DC | PRN
  Administered 2024-05-03: 10 mL via INTRAVENOUS

## 2024-05-03 MED ORDER — MORPHINE 100MG IN NS 100ML (1MG/ML) PREMIX INFUSION
0.0000 mg/h | INTRAVENOUS | Status: DC
  Administered 2024-05-03: 5 mg/h via INTRAVENOUS
  Filled 2024-05-03: qty 100

## 2024-05-03 MED ORDER — SODIUM CHLORIDE 0.9% FLUSH: 3.0000 mL | INTRAVENOUS | Status: DC | PRN

## 2024-05-03 MED ORDER — POLYVINYL ALCOHOL 1.4 % OP SOLN
1.0000 [drp] | Freq: Four times a day (QID) | OPHTHALMIC | Status: DC | PRN
Start: 2024-05-03 — End: 2024-05-03

## 2024-05-03 MED ORDER — GLYCOPYRROLATE 1 MG PO TABS
1.0000 mg | ORAL_TABLET | ORAL | Status: DC | PRN
Start: 2024-05-03 — End: 2024-05-03

## 2024-05-03 MED ORDER — LORAZEPAM 2 MG/ML IJ SOLN: 2.0000 mg | INTRAMUSCULAR | Status: DC | PRN

## 2024-05-03 MED ORDER — MORPHINE 100MG IN NS 100ML (1MG/ML) PREMIX INFUSION: 0.0000 mg/h | INTRAVENOUS | Status: DC

## 2024-05-03 MED ORDER — GLYCOPYRROLATE 0.2 MG/ML IJ SOLN: 0.2000 mg | INTRAMUSCULAR | Status: DC | PRN

## 2024-05-04 ENCOUNTER — Encounter

## 2024-05-04 LAB — CULTURE, BLOOD (ROUTINE X 2)
Culture: NO GROWTH
Culture: NO GROWTH

## 2024-05-11 ENCOUNTER — Ambulatory Visit: Admitting: Cardiology

## 2024-05-19 NOTE — Progress Notes (Signed)
 Followed up with RN after morning conversation with daughter and SIL at bedside. Additional family members have arrived and they are ready to transition to comfort measures at this time. Orders placed.   Louie Rover, MD Pulmonary and Critical Care Medicine [facility] 04/23/2024 12:40 PM Pager: see AMION  If no response to pager, please call critical care on call (see AMION) until 7pm After 7:00 pm call Elink

## 2024-05-19 NOTE — Progress Notes (Signed)
 SLP Cancellation Note  Patient Details Name: [name] MRN: [id number] DOB: 11/21/1932   Cancelled treatment:       Reason Eval/Treat Not Completed: Medical issues which prohibited therapy. Pt has continued to decline per discussion with RN. Will f/u for needs via chart tomorrow.    Curry Seefeldt, Hardin Leys 05/06/2024, 10:21 AM

## 2024-05-19 NOTE — Death Summary Note (Signed)
 DEATH SUMMARY   Patient Details  Name: [name] MRN: [id number] DOB: 05/14/1933  Admission/Discharge Information   Admit Date:  May 03, 2024  Date of Death: Date of Death: 05/07/24  Time of Death: Time of Death: 05-14-45  Length of Stay: 4  Referring Physician: Melva Stabile, MD   Reason(s) for Hospitalization  Syncope, altered mental status  Diagnoses  Preliminary cause of death: aspiration pneumonia Secondary Diagnoses (including complications and co-morbidities):  Principal Problem:   Aspiration pneumonia [facility]) Active Problems:   Acute respiratory failure with hypoxia [facility])   Pulmonary embolus [facility])   Brief Hospital Course (including significant findings, care, treatment, and services provided and events leading to death)  [name] was an 89 y.o. year old female with PPM (8 weeks ago), PAF (failed cardioversion twice in March 2025), HTN, COPD, GERD, TIA, and hyperlipidemia. She underwent Afib ablation the day prior to admission. The next day At 9:15 pm, she was weak, moaning, altered, and had a weak pulse and syncope. She never lost a pulse or had apnea. EMS arrived and she was intubated due to low GCS 4. Amiodarone  was d/c 2 months ago. Compliant with meds. No hx of active smoking (but second hand smoking), illicit drug use, or alcohol drinking. Given one dose of Vanco and Zosyn . Started on Levophed  due to hypotension. Medical Rx for hyperkalemia was given in ED. Intubated and admitted to ICU, treated with antibiotics for aspiration pneumonia. She was subsequently extubated. She had persistent hypoxemia and delirium related to RML and RLL aspiration pneumonia. Ultimately given her failure to improve with antibiotics, airway clearance, bronchodilators, her family elected to make her DNR and transitioned to comfort measures. She passed away peacefully with family at bedside.    Pertinent Labs and Studies  Significant Diagnostic Studies DG CHEST PORT 1 VIEW Result Date:  05-07-2024 CLINICAL DATA:  Respiratory distress. EXAM: PORTABLE CHEST 1 VIEW COMPARISON:  05/02/2024 FINDINGS: The cardio pericardial silhouette is enlarged. Asymmetric elevation of the right hemidiaphragm again noted. Right pleural effusion evident. Diffuse interstitial and patchy bilateral airspace disease again noted and mildly progressive in the left mid and lower lung. Permanent pacemaker again noted. Telemetry leads overlie the chest. IMPRESSION: Diffuse interstitial and patchy bilateral airspace disease is again noted and progressive in the left mid and lower lung. Electronically Signed   By: Donnal Fusi M.D.   On: May 07, 2024 08:40   DG CHEST PORT 1 VIEW Result Date: 05/02/2024 CLINICAL DATA:  Hypoxia. EXAM: PORTABLE CHEST 1 VIEW COMPARISON:  05/01/2024 FINDINGS: The cardio pericardial silhouette is enlarged. Volume loss in the right hemithorax with right base collapse/consolidation is similar to prior. Minimal atelectasis or infiltrate again noted left base. Diffuse interstitial opacity again noted. Single lead left-sided permanent pacemaker evident. Telemetry leads overlie the chest. IMPRESSION: 1. No significant interval change. 2. Volume loss in the right hemithorax with right base collapse/consolidation. Electronically Signed   By: Donnal Fusi M.D.   On: 05/02/2024 06:49   DG Chest Port 1 View Result Date: 05/01/2024 CLINICAL DATA:  Acute respiratory failure. EXAM: PORTABLE CHEST 1 VIEW COMPARISON:  05/08/2024. FINDINGS: Stable cardiomegaly. Left-sided pacemaker is unchanged. Endotracheal and nasogastric tubes have been removed. Minimal left basilar subsegmental atelectasis is noted. Increased right basilar opacity is noted concerning for pleural effusion with associated atelectasis or infiltrate. Bony thorax is unremarkable. IMPRESSION: Increased right basilar opacity as noted above. Minimal left basilar subsegmental atelectasis. Electronically Signed   By: Rosalene Colon M.D.   On:  05/01/2024 08:18   VAS US  LOWER EXTREMITY VENOUS (DVT) Result Date: 04/29/2024  Lower Venous DVT Study Patient Name:  [name]  Date of Exam:   04/29/2024 Medical Rec #: [id number]       Accession #:    [id number] Date of Birth: 1933/10/13       Patient Gender: F Patient Age:   10 years Exam Location:  [facility] Procedure:      VAS US  LOWER EXTREMITY VENOUS (DVT) Referring Phys: Lora Robinsons --------------------------------------------------------------------------------  Indications: Edema, Swelling, and pulmonary embolism.  Risk Factors: COPD, GERD, HTN, TIA. Limitations: Position of patient's left leg. Comparison Study: No priors. Performing Technologist: Franky Ivanoff Sturdivant-Jones RDMS, RVT  Examination Guidelines: A complete evaluation includes B-mode imaging, spectral Doppler, color Doppler, and power Doppler as needed of all accessible portions of each vessel. Bilateral testing is considered an integral part of a complete examination. Limited examinations for reoccurring indications may be performed as noted. The reflux portion of the exam is performed with the patient in reverse Trendelenburg.  +---------+---------------+---------+-----------+----------+--------------+ RIGHT    CompressibilityPhasicitySpontaneityPropertiesThrombus Aging +---------+---------------+---------+-----------+----------+--------------+ CFV      Full           Yes      Yes                                 +---------+---------------+---------+-----------+----------+--------------+ SFJ      Full                                                        +---------+---------------+---------+-----------+----------+--------------+ FV Prox  Full                                                        +---------+---------------+---------+-----------+----------+--------------+ FV Mid   Full                                                         +---------+---------------+---------+-----------+----------+--------------+ FV DistalFull                                                        +---------+---------------+---------+-----------+----------+--------------+ PFV      Full                                                        +---------+---------------+---------+-----------+----------+--------------+ POP      Full           Yes      Yes                                 +---------+---------------+---------+-----------+----------+--------------+  PTV      Full                                                        +---------+---------------+---------+-----------+----------+--------------+ PERO     Full                                                        +---------+---------------+---------+-----------+----------+--------------+   +---------+---------------+---------+-----------+----------+---------------+ LEFT     CompressibilityPhasicitySpontaneityPropertiesThrombus Aging  +---------+---------------+---------+-----------+----------+---------------+ CFV      Full           Yes      Yes                                  +---------+---------------+---------+-----------+----------+---------------+ SFJ      Full                                                         +---------+---------------+---------+-----------+----------+---------------+ FV Prox  Full                                                         +---------+---------------+---------+-----------+----------+---------------+ FV Mid   Full                                                         +---------+---------------+---------+-----------+----------+---------------+ FV DistalFull                                                         +---------+---------------+---------+-----------+----------+---------------+ PFV      Full                                                          +---------+---------------+---------+-----------+----------+---------------+ POP                     Yes      Yes                  Patent by color +---------+---------------+---------+-----------+----------+---------------+ PTV      Full                                                         +---------+---------------+---------+-----------+----------+---------------+  PERO     Full                                                         +---------+---------------+---------+-----------+----------+---------------+     Summary: BILATERAL: - No evidence of deep vein thrombosis seen in the lower extremities, bilaterally. - RIGHT: - No cystic structure found in the popliteal fossa.   *See table(s) above for measurements and observations. Electronically signed by Genny Kid MD on 04/29/2024 at 8:59:29 PM.    Final    ECHOCARDIOGRAM COMPLETE Result Date: 04/29/2024    ECHOCARDIOGRAM REPORT   Patient Name:   [name] Date of Exam: 04/29/2024 Medical Rec #:  [id number]      Height:       63.0 in Accession #:    [id number]     Weight:       202.6 lb Date of Birth:  1933-05-29      BSA:          1.944 m Patient Age:    89 years       BP:           138/77 mmHg Patient Gender: F              HR:           90 bpm. Exam Location:  Inpatient Procedure: 2D Echo, Cardiac Doppler and Color Doppler (Both Spectral and Color            Flow Doppler were utilized during procedure). Indications:    Pulmonary embolus  History:        Patient has prior history of Echocardiogram examinations, most                 recent 01/17/2024. COPD; Risk Factors:Hypertension.  Sonographer:    Jeralene Mom Referring Phys: [id number] LAURA R GLEASON IMPRESSIONS  1. Left ventricular ejection fraction, by estimation, is 50 to 55%. The left ventricle has low normal function. The left ventricle has no regional wall motion abnormalities. Left ventricular diastolic function could not be evaluated.  2. Right ventricular systolic  function is mildly reduced. The right ventricular size is mildly enlarged. There is moderately elevated pulmonary artery systolic pressure. The estimated right ventricular systolic pressure is 53.7 mmHg.  3. Left atrial size was severely dilated.  4. Right atrial size was mildly dilated.  5. The mitral valve was not well visualized. Trivial mitral valve regurgitation. No evidence of mitral stenosis. Severe mitral annular calcification.  6. The aortic valve was not well visualized. There is mild calcification of the aortic valve. There is mild thickening of the aortic valve. Aortic valve regurgitation is not visualized. Aortic valve sclerosis is present, with no evidence of aortic valve  stenosis.  7. The inferior vena cava is dilated in size with <50% respiratory variability, suggesting right atrial pressure of 15 mmHg. Comparison(s): Prior images reviewed side by side. Conclusion(s)/Recommendation(s): LVEF low normal without focal wall motion abnormalities. No effusion. RV mildly enlarged with mildly reduced function, elevated RVSP of 54 mmHg (with assumed RAP 15, though patient on ventilator). FINDINGS  Left Ventricle: Left ventricular ejection fraction, by estimation, is 50 to 55%. The left ventricle has low normal function. The left ventricle has no regional wall motion abnormalities. The left ventricular internal cavity size was normal in size. Suboptimal  image quality limits for assessment of left ventricular hypertrophy. Left ventricular diastolic function could not be evaluated due to paced rhythm. Left ventricular diastolic function could not be evaluated. Right Ventricle: The right ventricular size is mildly enlarged. Right vetricular wall thickness was not well visualized. Right ventricular systolic function is mildly reduced. There is moderately elevated pulmonary artery systolic pressure. The tricuspid  regurgitant velocity is 3.11 m/s, and with an assumed right atrial pressure of 15 mmHg, the estimated  right ventricular systolic pressure is 53.7 mmHg. Left Atrium: Left atrial size was severely dilated. Right Atrium: Right atrial size was mildly dilated. Pericardium: There is no evidence of pericardial effusion. Mitral Valve: The mitral valve was not well visualized. Severe mitral annular calcification. Trivial mitral valve regurgitation. No evidence of mitral valve stenosis. Tricuspid Valve: The tricuspid valve is grossly normal. Tricuspid valve regurgitation is mild . No evidence of tricuspid stenosis. Aortic Valve: The aortic valve was not well visualized. There is mild calcification of the aortic valve. There is mild thickening of the aortic valve. Aortic valve regurgitation is not visualized. Aortic valve sclerosis is present, with no evidence of aortic valve stenosis. Aortic valve mean gradient measures 3.0 mmHg. Aortic valve peak gradient measures 4.8 mmHg. Aortic valve area, by VTI measures 2.29 cm. Pulmonic Valve: The pulmonic valve was not well visualized. Pulmonic valve regurgitation is not visualized. No evidence of pulmonic stenosis. Aorta: The aortic root is normal in size and structure. Venous: The inferior vena cava is dilated in size with less than 50% respiratory variability, suggesting right atrial pressure of 15 mmHg. IAS/Shunts: The interatrial septum was not well visualized. Additional Comments: A device lead is visualized in the right atrium and right ventricle.  LEFT VENTRICLE PLAX 2D LVIDd:         4.70 cm     Diastology LVIDs:         4.20 cm     LV e' medial:    6.85 cm/s LV PW:         1.00 cm     LV E/e' medial:  14.3 LV IVS:        1.00 cm     LV e' lateral:   10.30 cm/s LVOT diam:     2.00 cm     LV E/e' lateral: 9.5 LV SV:         43 LV SV Index:   22 LVOT Area:     3.14 cm  LV Volumes (MOD) LV vol d, MOD A2C: 32.0 ml LV vol d, MOD A4C: 32.0 ml LV vol s, MOD A2C: 17.5 ml LV vol s, MOD A4C: 20.1 ml LV SV MOD A2C:     14.5 ml LV SV MOD A4C:     32.0 ml LV SV MOD BP:      13.2 ml  RIGHT VENTRICLE RV Basal diam:  4.20 cm RV Mid diam:    3.70 cm RV S prime:     10.00 cm/s TAPSE (M-mode): 1.2 cm LEFT ATRIUM              Index        RIGHT ATRIUM           Index LA diam:        3.70 cm  1.90 cm/m   RA Area:     16.40 cm LA Vol (A2C):   122.0 ml 62.75 ml/m  RA Volume:   35.80 ml  18.41 ml/m LA Vol (A4C):   81.0  ml  41.66 ml/m LA Biplane Vol: 106.0 ml 54.52 ml/m  AORTIC VALVE AV Area (Vmax):    2.38 cm AV Area (Vmean):   2.30 cm AV Area (VTI):     2.29 cm AV Vmax:           110.00 cm/s AV Vmean:          73.900 cm/s AV VTI:            0.189 m AV Peak Grad:      4.8 mmHg AV Mean Grad:      3.0 mmHg LVOT Vmax:         83.20 cm/s LVOT Vmean:        54.100 cm/s LVOT VTI:          0.138 m LVOT/AV VTI ratio: 0.73  AORTA Ao Root diam: 3.70 cm MITRAL VALVE               TRICUSPID VALVE MV Area (PHT): 3.65 cm    TR Peak grad:   38.7 mmHg MV Decel Time: 208 msec    TR Vmax:        311.00 cm/s MR Peak grad: 56.9 mmHg MR Vmax:      377.00 cm/s  SHUNTS MV E velocity: 98.10 cm/s  Systemic VTI:  0.14 m MV A velocity: 51.40 cm/s  Systemic Diam: 2.00 cm MV E/A ratio:  1.91 Sheryle Donning MD Electronically signed by Sheryle Donning MD Signature Date/Time: 04/29/2024/5:22:55 PM    Final    CT Angio Chest Pulmonary Embolism (PE) W or WO Contrast Result Date: 04/29/2024 CLINICAL DATA:  Family found patient on bedside commode. Family member open airway until EMS arrived. Patient arrived being bagged. Patient had ablation today for AFib. History of CHF EXAM: CT ANGIOGRAPHY CHEST WITH CONTRAST TECHNIQUE: Multidetector CT imaging of the chest was performed using the standard protocol during bolus administration of intravenous contrast. Multiplanar CT image reconstructions and MIPs were obtained to evaluate the vascular anatomy. RADIATION DOSE REDUCTION: This exam was performed according to the departmental dose-optimization program which includes automated exposure control, adjustment of the mA  and/or kV according to patient size and/or use of iterative reconstruction technique. CONTRAST:  75mL OMNIPAQUE  IOHEXOL  350 MG/ML SOLN COMPARISON:  Same day chest radiograph and CT chest 01/16/2024 FINDINGS: Cardiovascular: The right upper lobe pulmonary arteries are not visualized. The right upper and right lower lobe pulmonary arteries are not visualized. Suspected nonocclusive segmental emboli in the right middle lobe pulmonary arteries. There is evidence of elevated right heart pressures with reflux of contrast into the IVC with fluid-fluid level indicating slow flow. The RV/LV ratio is increased at 1.3. There is flattening of the interventricular septum. No pericardial effusion. Coronary artery and aortic atherosclerotic calcification. Left chest wall pacemaker. Mediastinum/Nodes: Endotracheal tube tip in the intrathoracic trachea. Large amount of debris in the trachea extending into both mainstem bronchi. Occlusive debris in multiple right upper lobe and bilateral lower lobe bronchi. Lungs/Pleura: Peribronchovascular ground-glass and consolidative opacities greater in the right lung. Centrilobular micro nodules and tree-in-bud opacities in the left lung. Moderate right pleural effusion. Compressive atelectasis of the right lower lobe. No pneumothorax. Upper Abdomen: No acute abnormality. Enteric tube tip in the stomach. Musculoskeletal: No acute fracture. Review of the MIP images confirms the above findings. IMPRESSION: 1. Suspected nonocclusive segmental emboli in the right middle lobe pulmonary arteries. The right upper and right lower lobe pulmonary arteries are not visualized and may be occluded secondary to pulmonary emboli or  invasion by a right hilar mass. 2. Evidence of elevated right heart pressures with reflux of contrast into the IVC with fluid-fluid level indicating slow flow. The RV/LV ratio is increased at 1.3. 3. Aspiration pneumonia with large amount of debris in the trachea and lower lobe  bronchi. 4. Moderate right pleural effusion. 5. Confluent right hilar adenopathy increased from prior. This may be reactive secondary to aspiration pneumonia however malignancy is not excluded. Recommend continued attention on follow-up. 6. Aortic Atherosclerosis (ICD10-I70.0). Critical Value/emergent results were called by telephone at the time of interpretation on 04/29/2024 at 1:58 am to provider [facility] , who verbally acknowledged these results. Electronically Signed   By: Rozell Cornet M.D.   On: 04/29/2024 02:00   CT HEAD WO CONTRAST ( ) Result Date: 04/29/2024 CLINICAL DATA:  Head trauma, minor (Age >= 65y). Altered mental status EXAM: CT HEAD WITHOUT CONTRAST TECHNIQUE: Contiguous axial images were obtained from the base of the skull through the vertex without intravenous contrast. RADIATION DOSE REDUCTION: This exam was performed according to the departmental dose-optimization program which includes automated exposure control, adjustment of the mA and/or kV according to patient size and/or use of iterative reconstruction technique. COMPARISON:  None Available. FINDINGS: Brain: Mild cerebral atrophy. No acute intracranial abnormality. Specifically, no hemorrhage, hydrocephalus, mass lesion, acute infarction, or significant intracranial injury. Vascular: No hyperdense vessel or unexpected calcification. Skull: No acute calvarial abnormality. Sinuses/Orbits: No acute findings Other: None IMPRESSION: No acute intracranial abnormality. Electronically Signed   By: Janeece Mechanic M.D.   On: 04/29/2024 01:38   DG Chest Portable 1 View Result Date: 04/29/2024 CLINICAL DATA:  Status post intubation EXAM: PORTABLE CHEST 1 VIEW COMPARISON:  01/17/2024 FINDINGS: Cardiac shadow is stable. Endotracheal tube and gastric catheter are noted in satisfactory position. Pacing device is again seen. Lungs are well aerated bilaterally. Increase in patchy infiltrate in the right upper lobe is noted. No bony  abnormality is seen. IMPRESSION: Tubes and lines in satisfactory position. Increase in patchy right upper lobe infiltrate. Electronically Signed   By: Violeta Grey M.D.   On: 04/29/2024 00:36   EP STUDY Result Date: 04/28/2024 SURGEON: Efraim Grange, MD PREPROCEDURE DIAGNOSIS: 1.  permanent atrial fibrillation with uncontrolled ventricular rates, POSTPROCEDURE DIAGNOSIS: 1.  permanent atrial fibrillation with uncontrolled ventricular rates, PROCEDURES: 1. His bundle recording and pacing 2. RV recording and pacing 3. AV nodal ablation 4. Electroanatomic mapping using a 3D mapping software 5. Single chamber pacemaker interrogation and reprogramming INTRODUCTION:  [name] is a 89 y.o. female with a history of symptomatic permanent atrial fibrillation with difficult to control ventricular rates who presents today for AV nodal ablation.   DESCRIPTION OF PROCEDURE: Informed written consent was obtained and the patient was brought to the Electrophysiology Lab in the fasting state. The patient was adequately sedated with intravenous medication as outlined in the nursing report. The patient's right groin was prepped and draped in the usual sterile fashion by the EP Lab staff. The patients single chamber pacemaker was interrogated and programmed VVI 40 bpm for the procedure.  Device lead pace/sense/impedance values were found to be stable.  Using ultrasound guidance and modified Seldinger technique, an 8-French was placed into the right common femoral vein.  Presenting Measurements: The patient presented to the Electrophysiology Lab in afib with RVR. The  QRS of 122 msec and a QT of 316 msec. The average RR interval was 605 msec. Ablation: Ventricular pacing was performed.  The ablation catheter was advanced under electroanatomic map  guidance to the area of the his bundle. A large atrial signal with His and small V signal was identified.  A series of RF lesions were delivered in this location with 35-40 Watts of  energy. Accelerated junctional rhythm was observed followed by AV block and V pacing.   The patient was observed to have an underlying escape rhythm at 30 bpm.  He was observed for 20 minutes without return of conduction. The procedure was therefore considered completed. All catheters were removed. The sheaths were removed and hemostasis was achieved with a Mynx device. EBL<23ml. There were no early apparent complications.  The pacemaker was interrogated and lead pace/sense/ impedance values remained unchanged when compared to baseline.  Device was programmed VVIR 90 bpm. CONCLUSIONS: 1. AFib with RVR upon presentation. 2. Successful AV nodal ablation 3. No early apparent complications. Efraim Grange, MD 04/28/2024 10:59 AM    Microbiology Recent Results (from the past 240 hours)  Culture, Respiratory w Gram Stain     Status: None   Collection Time: 04/29/24  3:00 AM   Specimen: Tracheal Aspirate; Respiratory  Result Value Ref Range Status   Specimen Description TRACHEAL ASPIRATE  Final   Special Requests NONE  Final   Gram Stain   Final    FEW WBC PRESENT,BOTH PMN AND MONONUCLEAR RARE YEAST RARE GRAM POSITIVE COCCI IN PAIRS    Culture   Final    RARE Normal respiratory flora-no Staph aureus or Pseudomonas seen Performed at [facility], [address] [place], [place] [address]    Report Status 05/01/2024 FINAL  Final  MRSA Next Gen by PCR, Nasal     Status: None   Collection Time: 04/29/24  3:00 AM  Result Value Ref Range Status   MRSA by PCR Next Gen NOT DETECTED NOT DETECTED Final    Comment: (NOTE) The GeneXpert MRSA Assay (FDA approved for NASAL specimens only), is one component of a comprehensive MRSA colonization surveillance program. It is not intended to diagnose MRSA infection nor to guide or monitor treatment for MRSA infections. Test performance is not FDA approved in patients less than 76 years old. Performed at [facility], [address]  [place], [place] [address]   Culture, blood (Routine X 2) w Reflex to ID Panel     Status: None (Preliminary result)   Collection Time: 04/29/24  5:53 AM   Specimen: BLOOD RIGHT ARM  Result Value Ref Range Status   Specimen Description BLOOD RIGHT ARM  Final   Special Requests   Final    BOTTLES DRAWN AEROBIC AND ANAEROBIC Blood Culture results may not be optimal due to an inadequate volume of blood received in culture bottles   Culture   Final    NO GROWTH 4 DAYS Performed at [facility], [address], [place], [place] [address]    Report Status PENDING  Incomplete  Culture, blood (Routine X 2) w Reflex to ID Panel     Status: None (Preliminary result)   Collection Time: 04/29/24  5:55 AM   Specimen: BLOOD RIGHT HAND  Result Value Ref Range Status   Specimen Description BLOOD RIGHT HAND  Final   Special Requests   Final    BOTTLES DRAWN AEROBIC ONLY Blood Culture results may not be optimal due to an inadequate volume of blood received in culture bottles   Culture   Final    NO GROWTH 4 DAYS Performed at [facility], [address], [place], [place] [address]    Report Status PENDING  Incomplete    Lab Basic Metabolic Panel: Recent Labs  Lab 04/29/24 0031 04/29/24 0428 04/29/24 0551 04/29/24 1539 04/30/24 0606 05/01/24 0707 05/02/24 0453  NA 135   < > 138 139 140 142 142  K 5.9*   < > 5.3* 4.9 4.2 3.9 3.9  CL 101  --  103 104 106 105 104  CO2 26  --  24 28 23 24 27   GLUCOSE 205*  --  138* 113* 95 103* 108*  BUN 22  --  19 21 24* 23 25*  CREATININE 1.20*  --  1.04* 1.15* 1.10* 0.98 0.97  CALCIUM  8.7*  --  8.9 9.1 8.7* 9.1 9.5  MG 2.2  --   --   --  1.8 1.8 2.0  PHOS  --   --   --   --  3.5 4.2 3.4   < > = values in this interval not displayed.   Liver Function Tests: Recent Labs  Lab 04/29/24 0031 04/29/24 1539 04/30/24 0606 05/02/24 0453  AST 66* 40 29  --   ALT 63* 67* 51*  --   ALKPHOS 97 78 61  --   BILITOT 0.6 1.0 0.8  --   PROT 6.0* 5.4*  5.1*  --   ALBUMIN 3.4* 2.9* 2.8* 3.0*   Recent Labs  Lab 04/29/24 0031  LIPASE 37   No results for input(s): AMMONIA in the last 168 hours. CBC: Recent Labs  Lab 04/29/24 0031 04/29/24 0428 04/29/24 1539 04/30/24 0606 05/01/24 0707  WBC 15.6*  --  15.4* 12.3* 13.6*  NEUTROABS 10.6*  --   --   --  11.2*  HGB 11.9* 11.6* 10.9* 10.9* 10.4*  HCT 40.6 34.0* 35.4* 34.6* 34.7*  MCV 92.7  --  87.2 87.2 89.9  PLT 309  --  243 212 234   Cardiac Enzymes: No results for input(s): CKTOTAL, CKMB, CKMBINDEX, TROPONINI in the last 168 hours. Sepsis Labs: Recent Labs  Lab 04/29/24 0029 04/29/24 0031 04/29/24 0553 04/29/24 1539 04/30/24 0606 05/01/24 0707  WBC  --  15.6*  --  15.4* 12.3* 13.6*  LATICACIDVEN 2.2*  --  1.8 1.5  --   --

## 2024-05-19 NOTE — Progress Notes (Signed)
 NAMECola [name], MRN:  [id number], DOB:  1933/07/21, LOS: 4 ADMISSION DATE:  04/29/2024, CONSULTATION DATE: 04/29/2024 REFERRING MD: Onetha Bile, MD, CHIEF COMPLAINT: AMS for 4 hrs  History of Present Illness:  An 89 yr old female patient with PPM (8 weeks ago), PAF (failed cardioversion twice in March 2025), HTN, COPD, GERD, TIA, and hyperlipidemia. She underwent Afib ablation yesterday noon time and discharged home. At 9:15 pm, she was weak, moaning, altered, and had a weak pulse and syncope. She never lost a pulse or had apnea. EMS arrived and she was intubated due to low GCS 4. Amiodarone  was d/c 2 months ago. Compliant with meds. No hx of active smoking (but second hand smoking), illicit drug use, or alcohol drinking. Given one dose of Vanco and Zosyn . Started on Levophed  due to hypotension. Medical Rx for hyperkalemia was given in ED.   Pertinent  Medical History  PPM (April 2025), PAF (failed cardioversion twice March 2025), HTN, COPD, GERD, TIA, hyperlipidemia Significant Hospital Events: Including procedures, antibiotic start and stop dates in addition to other pertinent events   6/11: intubated, seen in ED, admit to ICU, CTA significant for R segmental PE 6/12 stable, SBT/SAT 6/13 extubated yesterday, has significant secretions and mucous plugging on 7L   Interim History / Subjective:  Worsening respiratory distress and delirium. Unable to participate in airway clearance therapies. Blood with suctioning.    Objective    Blood pressure (!) 161/99, pulse 90, temperature 97.7 F (36.5 C), temperature source Oral, resp. rate (!) 29, height 5' 3 (1.6 m), weight 96 kg, SpO2 96%.    FiO2 (%):  [45 %-50 %] 50 %   Intake/Output Summary (Last 24 hours) at 05/02/2024 0920 Last data filed at 05/07/2024 0800 Gross per 24 hour  Intake 357.7 ml  Output 1125 ml  Net -767.3 ml   Filed Weights   05/01/24 0349 05/02/24 0409 05/18/2024 0411  Weight: 95.6 kg 94 kg 96 kg    Frail,  elderly, delrious woman on HHFNC Coarse bilateral rhonchi, weak cough Tachycardic Thin skin Moves all 4 extremities, but moaning and delirious and unable to answer questions   Chest xray obtained today shows worsening RML and RLL atelectasis   Resolved problem list   AKI   Assessment and Plan   Acute hypoxic resp failure secondary to aspiration Pneumonia and R segmental PE Baseline COPD, passive smoke exposure - continue ceftriaxone  for 5 days for pneumonia - continue bronchodilators - continue HTS nebs and chest pt and flutter valve and prn NTS - remains high risk for reintubation and requires ICU level monitoring - discussed plan of care with daughter and SIL at bedside. She is high risk for intubation for progressive delirium and aspiration. Failure to make meaningful improvements over the last 4 days. I discussed involving family to make her DNR and consider transition to comfort measures. Daughter will talk to her brother about this now.   S/p Afib ablation 04/28/2024 PPM, HTN, dyslipidemia - currently on prn labetalol  due to NPO status - resume enteral labetalol  as able  Agitated delirium - frequent reorientation - precedex  - seroquel at bedtime   Slightly elevated LFT -Monitor  HyperK -stable, continue to follow   GERD  -H2B   Best Practice (right click and Reselect all SmartList Selections daily)   Diet/type: NPO w/ oral meds DVT prophylaxis systemic dose LMWH Pressure ulcer(s): N/A GI prophylaxis: H2B Lines: N/A Foley:  Yes, and it is still needed Code Status:  full code  Last date of multidisciplinary goals of care discussion [family updated at the bedside 6/15]  Labs   CBC: Recent Labs  Lab 04/29/24 0031 04/29/24 0428 04/29/24 1539 04/30/24 0606 05/01/24 0707  WBC 15.6*  --  15.4* 12.3* 13.6*  NEUTROABS 10.6*  --   --   --  11.2*  HGB 11.9* 11.6* 10.9* 10.9* 10.4*  HCT 40.6 34.0* 35.4* 34.6* 34.7*  MCV 92.7  --  87.2 87.2 89.9  PLT 309   --  243 212 234    Basic Metabolic Panel: Recent Labs  Lab 04/29/24 0031 04/29/24 0428 04/29/24 0551 04/29/24 1539 04/30/24 0606 05/01/24 0707 05/02/24 0453  NA 135   < > 138 139 140 142 142  K 5.9*   < > 5.3* 4.9 4.2 3.9 3.9  CL 101  --  103 104 106 105 104  CO2 26  --  24 28 23 24 27   GLUCOSE 205*  --  138* 113* 95 103* 108*  BUN 22  --  19 21 24* 23 25*  CREATININE 1.20*  --  1.04* 1.15* 1.10* 0.98 0.97  CALCIUM  8.7*  --  8.9 9.1 8.7* 9.1 9.5  MG 2.2  --   --   --  1.8 1.8 2.0  PHOS  --   --   --   --  3.5 4.2 3.4   < > = values in this interval not displayed.   GFR: Estimated Creatinine Clearance: 43.3 mL/min (by C-G formula based on SCr of 0.97 mg/dL). Recent Labs  Lab 04/29/24 0029 04/29/24 0031 04/29/24 0553 04/29/24 1539 04/30/24 0606 05/01/24 0707  WBC  --  15.6*  --  15.4* 12.3* 13.6*  LATICACIDVEN 2.2*  --  1.8 1.5  --   --     Liver Function Tests: Recent Labs  Lab 04/29/24 0031 04/29/24 1539 04/30/24 0606 05/02/24 0453  AST 66* 40 29  --   ALT 63* 67* 51*  --   ALKPHOS 97 78 61  --   BILITOT 0.6 1.0 0.8  --   PROT 6.0* 5.4* 5.1*  --   ALBUMIN 3.4* 2.9* 2.8* 3.0*   Recent Labs  Lab 04/29/24 0031  LIPASE 37   No results for input(s): AMMONIA in the last 168 hours.  ABG    Component Value Date/Time   PHART 7.39 04/30/2024 0541   PCO2ART 42 04/30/2024 0541   PO2ART 79 (L) 04/30/2024 0541   HCO3 25.4 04/30/2024 0541   TCO2 26 04/29/2024 0428   ACIDBASEDEF 1.0 04/29/2024 0428   O2SAT 96.7 04/30/2024 0541     Coagulation Profile: No results for input(s): INR, PROTIME in the last 168 hours.  Cardiac Enzymes: No results for input(s): CKTOTAL, CKMB, CKMBINDEX, TROPONINI in the last 168 hours.  HbA1C: Hgb A1c MFr Bld  Date/Time Value Ref Range Status  04/29/2024 05:51 AM 5.9 (H) 4.8 - 5.6 % Final    Comment:    (NOTE) Diagnosis of Diabetes The following HbA1c ranges recommended by the American Diabetes Association  (ADA) may be used as an aid in the diagnosis of diabetes mellitus.  Hemoglobin             Suggested A1C NGSP%              Diagnosis  <5.7                   Non Diabetic  5.7-6.4  Pre-Diabetic  >6.4                   Diabetic  <7.0                   Glycemic control for                       adults with diabetes.    09/13/2020 12:05 PM 5.8 (H) 4.8 - 5.6 % Final    Comment:    (NOTE)         Prediabetes: 5.7 - 6.4         Diabetes: >6.4         Glycemic control for adults with diabetes: <7.0     CBG: Recent Labs  Lab 05/02/24 0738 05/02/24 1127 05/02/24 1508 05/02/24 2014 05/02/24 2315  GLUCAP 122* 115* 133* 135* 110*    Critical care time:    The patient is critically ill due to respiratory failure, encephalopathy.  Critical care was necessary to treat or prevent imminent or life-threatening deterioration.  Critical care was time spent personally by me on the following activities: development of treatment plan with patient and/or surrogate as well as nursing, discussions with consultants, evaluation of patient's response to treatment, examination of patient, obtaining history from patient or surrogate, ordering and performing treatments and interventions, ordering and review of laboratory studies, ordering and review of radiographic studies, pulse oximetry, re-evaluation of patient's condition and participation in multidisciplinary rounds.   Critical Care Time devoted to patient care services described in this note is 45 minutes. This time reflects time of care of this signee Yann Biehn S Jarrick Fjeld . This critical care time does not reflect separately billable procedures or procedure time, teaching time or supervisory time of PA/NP/Med student/Med Resident etc but could involve care discussion time.       Aleck Hurdle [facility] 05/09/2024 9:27 AM  Pager: see AMION  If no response to pager , please call critical care on call  (see AMION) until 7pm After 7:00 pm call Elink

## 2024-05-19 DEATH — deceased

## 2024-06-15 ENCOUNTER — Encounter: Admitting: Student

## 2024-06-30 ENCOUNTER — Telehealth: Payer: Self-pay | Admitting: Cardiovascular Disease

## 2024-06-30 NOTE — Telephone Encounter (Signed)
 Returned call and spoke with daughter and informed her that she can either discard the monitor or send it back to the company but that there will be no reimbursement if she decides the latter. She was appreciative of the call back and questions answered.

## 2024-06-30 NOTE — Telephone Encounter (Signed)
 Pt daughter called stating she received a letter that she needs to send pt monitor back because she has passed away. She asked if our insurance paid for it why do we have to send it back  I informed her that might be a question for the company itself but will forward to device clinic to see if they know.

## 2024-08-03 ENCOUNTER — Encounter

## 2024-11-02 ENCOUNTER — Encounter

## 2025-02-01 ENCOUNTER — Encounter

## 2025-05-03 ENCOUNTER — Encounter
